# Patient Record
Sex: Male | Born: 1945 | Race: White | Hispanic: No | Marital: Married | Smoking: Former smoker
Health system: Southern US, Community
[De-identification: ages and names within clinical notes are randomized; demographics above are authoritative.]

## PROBLEM LIST (undated history)

## (undated) DIAGNOSIS — Z974 Presence of external hearing-aid: Secondary | ICD-10-CM

## (undated) DIAGNOSIS — G4733 Obstructive sleep apnea (adult) (pediatric): Secondary | ICD-10-CM

## (undated) DIAGNOSIS — Z9989 Dependence on other enabling machines and devices: Secondary | ICD-10-CM

## (undated) DIAGNOSIS — H269 Unspecified cataract: Secondary | ICD-10-CM

## (undated) DIAGNOSIS — J45909 Unspecified asthma, uncomplicated: Secondary | ICD-10-CM

## (undated) DIAGNOSIS — N182 Chronic kidney disease, stage 2 (mild): Secondary | ICD-10-CM

## (undated) DIAGNOSIS — M109 Gout, unspecified: Secondary | ICD-10-CM

## (undated) DIAGNOSIS — M4800 Spinal stenosis, site unspecified: Secondary | ICD-10-CM

## (undated) DIAGNOSIS — Z973 Presence of spectacles and contact lenses: Secondary | ICD-10-CM

## (undated) DIAGNOSIS — I10 Essential (primary) hypertension: Secondary | ICD-10-CM

## (undated) DIAGNOSIS — K4090 Unilateral inguinal hernia, without obstruction or gangrene, not specified as recurrent: Secondary | ICD-10-CM

## (undated) DIAGNOSIS — R0602 Shortness of breath: Secondary | ICD-10-CM

## (undated) DIAGNOSIS — R05 Cough: Secondary | ICD-10-CM

## (undated) DIAGNOSIS — J984 Other disorders of lung: Secondary | ICD-10-CM

## (undated) DIAGNOSIS — H409 Unspecified glaucoma: Secondary | ICD-10-CM

## (undated) DIAGNOSIS — T7840XA Allergy, unspecified, initial encounter: Secondary | ICD-10-CM

## (undated) DIAGNOSIS — M1990 Unspecified osteoarthritis, unspecified site: Secondary | ICD-10-CM

## (undated) DIAGNOSIS — K760 Fatty (change of) liver, not elsewhere classified: Secondary | ICD-10-CM

## (undated) DIAGNOSIS — G4730 Sleep apnea, unspecified: Secondary | ICD-10-CM

## (undated) DIAGNOSIS — J189 Pneumonia, unspecified organism: Secondary | ICD-10-CM

## (undated) DIAGNOSIS — E119 Type 2 diabetes mellitus without complications: Secondary | ICD-10-CM

## (undated) DIAGNOSIS — E669 Obesity, unspecified: Secondary | ICD-10-CM

## (undated) DIAGNOSIS — H9190 Unspecified hearing loss, unspecified ear: Secondary | ICD-10-CM

## (undated) DIAGNOSIS — E785 Hyperlipidemia, unspecified: Secondary | ICD-10-CM

## (undated) DIAGNOSIS — M25569 Pain in unspecified knee: Secondary | ICD-10-CM

## (undated) DIAGNOSIS — IMO0002 Reserved for concepts with insufficient information to code with codable children: Secondary | ICD-10-CM

## (undated) DIAGNOSIS — R0609 Other forms of dyspnea: Secondary | ICD-10-CM

## (undated) DIAGNOSIS — D1431 Benign neoplasm of right bronchus and lung: Secondary | ICD-10-CM

## (undated) DIAGNOSIS — R600 Localized edema: Secondary | ICD-10-CM

## (undated) DIAGNOSIS — J309 Allergic rhinitis, unspecified: Secondary | ICD-10-CM

## (undated) DIAGNOSIS — H6980 Other specified disorders of Eustachian tube, unspecified ear: Secondary | ICD-10-CM

## (undated) DIAGNOSIS — R0600 Dyspnea, unspecified: Secondary | ICD-10-CM

## (undated) DIAGNOSIS — N200 Calculus of kidney: Secondary | ICD-10-CM

## (undated) DIAGNOSIS — Z87442 Personal history of urinary calculi: Secondary | ICD-10-CM

## (undated) DIAGNOSIS — R319 Hematuria, unspecified: Secondary | ICD-10-CM

## (undated) DIAGNOSIS — R053 Chronic cough: Secondary | ICD-10-CM

## (undated) DIAGNOSIS — K219 Gastro-esophageal reflux disease without esophagitis: Secondary | ICD-10-CM

## (undated) DIAGNOSIS — H6990 Unspecified Eustachian tube disorder, unspecified ear: Secondary | ICD-10-CM

## (undated) DIAGNOSIS — M5430 Sciatica, unspecified side: Secondary | ICD-10-CM

## (undated) HISTORY — DX: Sciatica, unspecified side: M54.30

## (undated) HISTORY — DX: Unspecified osteoarthritis, unspecified site: M19.90

## (undated) HISTORY — DX: Fatty (change of) liver, not elsewhere classified: K76.0

## (undated) HISTORY — PX: LOBECTOMY: SHX5089

## (undated) HISTORY — PX: HERNIA REPAIR: SHX51

## (undated) HISTORY — DX: Unspecified hearing loss, unspecified ear: H91.90

## (undated) HISTORY — PX: OTHER SURGICAL HISTORY: SHX169

## (undated) HISTORY — DX: Localized edema: R60.0

## (undated) HISTORY — DX: Sleep apnea, unspecified: G47.30

## (undated) HISTORY — DX: Reserved for concepts with insufficient information to code with codable children: IMO0002

## (undated) HISTORY — DX: Gout, unspecified: M10.9

## (undated) HISTORY — DX: Spinal stenosis, site unspecified: M48.00

## (undated) HISTORY — DX: Hematuria, unspecified: R31.9

## (undated) HISTORY — DX: Essential (primary) hypertension: I10

## (undated) HISTORY — DX: Hyperlipidemia, unspecified: E78.5

## (undated) HISTORY — DX: Other disorders of lung: J98.4

## (undated) HISTORY — DX: Unspecified eustachian tube disorder, unspecified ear: H69.90

## (undated) HISTORY — DX: Unspecified cataract: H26.9

## (undated) HISTORY — DX: Obesity, unspecified: E66.9

## (undated) HISTORY — DX: Calculus of kidney: N20.0

## (undated) HISTORY — DX: Type 2 diabetes mellitus without complications: E11.9

## (undated) HISTORY — DX: Pain in unspecified knee: M25.569

## (undated) HISTORY — DX: Allergy, unspecified, initial encounter: T78.40XA

## (undated) HISTORY — DX: Pneumonia, unspecified organism: J18.9

## (undated) HISTORY — DX: Allergic rhinitis, unspecified: J30.9

## (undated) HISTORY — PX: BACK SURGERY: SHX140

## (undated) HISTORY — DX: Other specified disorders of Eustachian tube, unspecified ear: H69.80

## (undated) HISTORY — PX: LITHOTRIPSY: SUR834

---
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
## 1974-07-13 HISTORY — PX: APPENDECTOMY: SHX54

## 1978-07-13 HISTORY — PX: OTHER SURGICAL HISTORY: SHX169

## 1999-07-14 HISTORY — PX: HEMORRHOID SURGERY: SHX153

## 2010-08-13 HISTORY — PX: COLONOSCOPY: SHX174

## 2011-08-20 DIAGNOSIS — IMO0002 Reserved for concepts with insufficient information to code with codable children: Secondary | ICD-10-CM | POA: Diagnosis not present

## 2011-08-20 DIAGNOSIS — M999 Biomechanical lesion, unspecified: Secondary | ICD-10-CM | POA: Diagnosis not present

## 2011-08-21 DIAGNOSIS — M999 Biomechanical lesion, unspecified: Secondary | ICD-10-CM | POA: Diagnosis not present

## 2011-08-21 DIAGNOSIS — IMO0002 Reserved for concepts with insufficient information to code with codable children: Secondary | ICD-10-CM | POA: Diagnosis not present

## 2011-08-24 DIAGNOSIS — IMO0002 Reserved for concepts with insufficient information to code with codable children: Secondary | ICD-10-CM | POA: Diagnosis not present

## 2011-08-24 DIAGNOSIS — M999 Biomechanical lesion, unspecified: Secondary | ICD-10-CM | POA: Diagnosis not present

## 2011-08-26 DIAGNOSIS — M999 Biomechanical lesion, unspecified: Secondary | ICD-10-CM | POA: Diagnosis not present

## 2011-08-26 DIAGNOSIS — IMO0002 Reserved for concepts with insufficient information to code with codable children: Secondary | ICD-10-CM | POA: Diagnosis not present

## 2011-08-31 DIAGNOSIS — M999 Biomechanical lesion, unspecified: Secondary | ICD-10-CM | POA: Diagnosis not present

## 2011-08-31 DIAGNOSIS — IMO0002 Reserved for concepts with insufficient information to code with codable children: Secondary | ICD-10-CM | POA: Diagnosis not present

## 2011-08-31 DIAGNOSIS — M4800 Spinal stenosis, site unspecified: Secondary | ICD-10-CM | POA: Diagnosis not present

## 2011-08-31 DIAGNOSIS — M5137 Other intervertebral disc degeneration, lumbosacral region: Secondary | ICD-10-CM | POA: Diagnosis not present

## 2011-11-19 DIAGNOSIS — E119 Type 2 diabetes mellitus without complications: Secondary | ICD-10-CM | POA: Diagnosis not present

## 2011-11-19 DIAGNOSIS — H2510 Age-related nuclear cataract, unspecified eye: Secondary | ICD-10-CM | POA: Diagnosis not present

## 2011-11-19 DIAGNOSIS — H52229 Regular astigmatism, unspecified eye: Secondary | ICD-10-CM | POA: Diagnosis not present

## 2011-11-19 DIAGNOSIS — H5200 Hypermetropia, unspecified eye: Secondary | ICD-10-CM | POA: Diagnosis not present

## 2012-01-05 DIAGNOSIS — K7689 Other specified diseases of liver: Secondary | ICD-10-CM | POA: Diagnosis not present

## 2012-01-05 DIAGNOSIS — E785 Hyperlipidemia, unspecified: Secondary | ICD-10-CM | POA: Diagnosis not present

## 2012-01-05 DIAGNOSIS — I10 Essential (primary) hypertension: Secondary | ICD-10-CM | POA: Diagnosis not present

## 2012-01-05 DIAGNOSIS — M109 Gout, unspecified: Secondary | ICD-10-CM | POA: Diagnosis not present

## 2012-01-05 DIAGNOSIS — Z23 Encounter for immunization: Secondary | ICD-10-CM | POA: Diagnosis not present

## 2012-02-10 DIAGNOSIS — M25579 Pain in unspecified ankle and joints of unspecified foot: Secondary | ICD-10-CM | POA: Diagnosis not present

## 2012-02-25 DIAGNOSIS — J309 Allergic rhinitis, unspecified: Secondary | ICD-10-CM | POA: Diagnosis not present

## 2012-02-25 DIAGNOSIS — I10 Essential (primary) hypertension: Secondary | ICD-10-CM | POA: Diagnosis not present

## 2012-02-25 DIAGNOSIS — J019 Acute sinusitis, unspecified: Secondary | ICD-10-CM | POA: Diagnosis not present

## 2012-02-25 DIAGNOSIS — E119 Type 2 diabetes mellitus without complications: Secondary | ICD-10-CM | POA: Diagnosis not present

## 2012-09-14 DIAGNOSIS — E119 Type 2 diabetes mellitus without complications: Secondary | ICD-10-CM | POA: Diagnosis not present

## 2012-09-14 DIAGNOSIS — J069 Acute upper respiratory infection, unspecified: Secondary | ICD-10-CM | POA: Diagnosis not present

## 2012-09-14 DIAGNOSIS — I10 Essential (primary) hypertension: Secondary | ICD-10-CM | POA: Diagnosis not present

## 2012-09-14 DIAGNOSIS — H6980 Other specified disorders of Eustachian tube, unspecified ear: Secondary | ICD-10-CM | POA: Diagnosis not present

## 2012-09-30 DIAGNOSIS — I10 Essential (primary) hypertension: Secondary | ICD-10-CM | POA: Diagnosis not present

## 2012-09-30 DIAGNOSIS — E785 Hyperlipidemia, unspecified: Secondary | ICD-10-CM | POA: Diagnosis not present

## 2012-11-15 DIAGNOSIS — E669 Obesity, unspecified: Secondary | ICD-10-CM | POA: Diagnosis not present

## 2012-11-15 DIAGNOSIS — E119 Type 2 diabetes mellitus without complications: Secondary | ICD-10-CM | POA: Diagnosis not present

## 2012-11-15 DIAGNOSIS — I10 Essential (primary) hypertension: Secondary | ICD-10-CM | POA: Diagnosis not present

## 2012-11-15 DIAGNOSIS — E785 Hyperlipidemia, unspecified: Secondary | ICD-10-CM | POA: Diagnosis not present

## 2012-12-14 DIAGNOSIS — E119 Type 2 diabetes mellitus without complications: Secondary | ICD-10-CM | POA: Diagnosis not present

## 2012-12-14 DIAGNOSIS — H2510 Age-related nuclear cataract, unspecified eye: Secondary | ICD-10-CM | POA: Diagnosis not present

## 2012-12-14 DIAGNOSIS — H43399 Other vitreous opacities, unspecified eye: Secondary | ICD-10-CM | POA: Diagnosis not present

## 2013-01-05 DIAGNOSIS — M48062 Spinal stenosis, lumbar region with neurogenic claudication: Secondary | ICD-10-CM | POA: Diagnosis not present

## 2013-01-05 DIAGNOSIS — M5137 Other intervertebral disc degeneration, lumbosacral region: Secondary | ICD-10-CM | POA: Diagnosis not present

## 2013-01-10 DIAGNOSIS — M545 Low back pain: Secondary | ICD-10-CM | POA: Diagnosis not present

## 2013-01-12 DIAGNOSIS — M545 Low back pain: Secondary | ICD-10-CM | POA: Diagnosis not present

## 2013-01-16 DIAGNOSIS — R609 Edema, unspecified: Secondary | ICD-10-CM | POA: Diagnosis not present

## 2013-01-16 DIAGNOSIS — E785 Hyperlipidemia, unspecified: Secondary | ICD-10-CM | POA: Diagnosis not present

## 2013-01-16 DIAGNOSIS — I10 Essential (primary) hypertension: Secondary | ICD-10-CM | POA: Diagnosis not present

## 2013-01-16 DIAGNOSIS — E119 Type 2 diabetes mellitus without complications: Secondary | ICD-10-CM | POA: Diagnosis not present

## 2013-01-20 DIAGNOSIS — M545 Low back pain: Secondary | ICD-10-CM | POA: Diagnosis not present

## 2013-01-26 DIAGNOSIS — M545 Low back pain: Secondary | ICD-10-CM | POA: Diagnosis not present

## 2013-02-07 DIAGNOSIS — M545 Low back pain: Secondary | ICD-10-CM | POA: Diagnosis not present

## 2013-02-07 DIAGNOSIS — R609 Edema, unspecified: Secondary | ICD-10-CM | POA: Diagnosis not present

## 2013-02-07 DIAGNOSIS — I10 Essential (primary) hypertension: Secondary | ICD-10-CM | POA: Diagnosis not present

## 2013-02-07 DIAGNOSIS — R5381 Other malaise: Secondary | ICD-10-CM | POA: Diagnosis not present

## 2013-02-07 DIAGNOSIS — R0609 Other forms of dyspnea: Secondary | ICD-10-CM | POA: Diagnosis not present

## 2013-02-07 DIAGNOSIS — R5383 Other fatigue: Secondary | ICD-10-CM | POA: Diagnosis not present

## 2013-02-07 DIAGNOSIS — E119 Type 2 diabetes mellitus without complications: Secondary | ICD-10-CM | POA: Diagnosis not present

## 2013-02-08 DIAGNOSIS — R0602 Shortness of breath: Secondary | ICD-10-CM | POA: Diagnosis not present

## 2013-02-08 DIAGNOSIS — R911 Solitary pulmonary nodule: Secondary | ICD-10-CM | POA: Diagnosis not present

## 2013-02-08 DIAGNOSIS — R609 Edema, unspecified: Secondary | ICD-10-CM | POA: Diagnosis not present

## 2013-02-08 DIAGNOSIS — I10 Essential (primary) hypertension: Secondary | ICD-10-CM | POA: Diagnosis not present

## 2013-02-08 DIAGNOSIS — R0989 Other specified symptoms and signs involving the circulatory and respiratory systems: Secondary | ICD-10-CM | POA: Diagnosis not present

## 2013-02-08 DIAGNOSIS — R0609 Other forms of dyspnea: Secondary | ICD-10-CM | POA: Diagnosis not present

## 2013-02-08 DIAGNOSIS — E119 Type 2 diabetes mellitus without complications: Secondary | ICD-10-CM | POA: Diagnosis not present

## 2013-02-10 HISTORY — PX: TRANSTHORACIC ECHOCARDIOGRAM: SHX275

## 2013-02-15 DIAGNOSIS — R0989 Other specified symptoms and signs involving the circulatory and respiratory systems: Secondary | ICD-10-CM | POA: Diagnosis not present

## 2013-02-15 DIAGNOSIS — I10 Essential (primary) hypertension: Secondary | ICD-10-CM | POA: Diagnosis not present

## 2013-02-15 DIAGNOSIS — R0609 Other forms of dyspnea: Secondary | ICD-10-CM | POA: Diagnosis not present

## 2013-02-15 DIAGNOSIS — R609 Edema, unspecified: Secondary | ICD-10-CM | POA: Diagnosis not present

## 2013-02-15 DIAGNOSIS — Z6841 Body Mass Index (BMI) 40.0 and over, adult: Secondary | ICD-10-CM | POA: Diagnosis not present

## 2013-02-15 DIAGNOSIS — M109 Gout, unspecified: Secondary | ICD-10-CM | POA: Diagnosis not present

## 2013-02-15 DIAGNOSIS — E119 Type 2 diabetes mellitus without complications: Secondary | ICD-10-CM | POA: Diagnosis not present

## 2013-02-15 DIAGNOSIS — Z1331 Encounter for screening for depression: Secondary | ICD-10-CM | POA: Diagnosis not present

## 2013-02-16 ENCOUNTER — Ambulatory Visit (HOSPITAL_COMMUNITY): Payer: Medicare Other | Attending: Endocrinology

## 2013-02-16 ENCOUNTER — Other Ambulatory Visit (HOSPITAL_COMMUNITY): Payer: Self-pay | Admitting: Internal Medicine

## 2013-02-16 DIAGNOSIS — Z87891 Personal history of nicotine dependence: Secondary | ICD-10-CM | POA: Insufficient documentation

## 2013-02-16 DIAGNOSIS — R0609 Other forms of dyspnea: Secondary | ICD-10-CM | POA: Insufficient documentation

## 2013-02-16 DIAGNOSIS — R0602 Shortness of breath: Secondary | ICD-10-CM | POA: Diagnosis not present

## 2013-02-16 DIAGNOSIS — R0989 Other specified symptoms and signs involving the circulatory and respiratory systems: Secondary | ICD-10-CM | POA: Insufficient documentation

## 2013-02-16 NOTE — Progress Notes (Signed)
Echocardiogram performed.  

## 2013-02-23 DIAGNOSIS — M545 Low back pain: Secondary | ICD-10-CM | POA: Diagnosis not present

## 2013-03-02 DIAGNOSIS — K769 Liver disease, unspecified: Secondary | ICD-10-CM | POA: Diagnosis not present

## 2013-03-02 DIAGNOSIS — R918 Other nonspecific abnormal finding of lung field: Secondary | ICD-10-CM | POA: Diagnosis not present

## 2013-03-02 DIAGNOSIS — N281 Cyst of kidney, acquired: Secondary | ICD-10-CM | POA: Diagnosis not present

## 2013-03-02 DIAGNOSIS — I7090 Unspecified atherosclerosis: Secondary | ICD-10-CM | POA: Diagnosis not present

## 2013-03-10 ENCOUNTER — Encounter: Payer: Self-pay | Admitting: *Deleted

## 2013-03-10 DIAGNOSIS — R911 Solitary pulmonary nodule: Secondary | ICD-10-CM | POA: Insufficient documentation

## 2013-03-14 ENCOUNTER — Institutional Professional Consult (permissible substitution) (INDEPENDENT_AMBULATORY_CARE_PROVIDER_SITE_OTHER): Payer: Medicare Other | Admitting: Thoracic Surgery (Cardiothoracic Vascular Surgery)

## 2013-03-14 ENCOUNTER — Encounter: Payer: Self-pay | Admitting: Thoracic Surgery (Cardiothoracic Vascular Surgery)

## 2013-03-14 ENCOUNTER — Other Ambulatory Visit: Payer: Self-pay | Admitting: *Deleted

## 2013-03-14 VITALS — BP 163/85 | HR 79 | Resp 20 | Ht 68.0 in | Wt 265.0 lb

## 2013-03-14 DIAGNOSIS — R911 Solitary pulmonary nodule: Secondary | ICD-10-CM | POA: Diagnosis not present

## 2013-03-14 NOTE — Progress Notes (Signed)
PCP is Alysia Penna, MD Referring Provider is Alysia Penna, MD  Chief Complaint  Patient presents with  . Lung Lesion    Surgical eval on RUL Lung nodule Chest CT 03/02/13     HPI: 67 year old former smoker presents for evaluation of a right upper lobe lung nodule.  [name] is a 67 year old gentleman with diabetes, hypertension, remote tobacco abuse, and possibly congestive heart failure. He has been complaining of shortness of breath with exertion and fluid retention. He states that he gets short of breath with almost any exertion such as walking 100 yards or less on level ground. He would become very short of breath walking up one flight of stairs. Recently he went to a baseball game and was unable to walk up an incline to the stadium and had to ride up in a golf cart. Basically says that almost any exertion will cause him to have shortness of breath. He says he feels like he is occasionally wheezing but it does not happen every time. He's not had any chest pain, pressure, or tightness. His chronic lower extremity edema has also worsened recently.  As part of his evaluation he had a chest x-ray which showed a right upper lobe mass. Of note he had a known mass which was previously biopsied back in 2004. The biopsy showed a benign fibrous tumor. The finding on chest x-ray led to a CT of the chest which showed a 2.6 x 2.2 cm mass in the right upper lobe. This was smoothly marginated. He was admitted to the hospital with "congestive heart failure and lung cancer". He went to see Dr. Link Snuffer. He had an echocardiogram done. It showed normal left ventricular function with ejection fraction of 65-70%. There was grade 1 diastolic dysfunction. He had a CT with contrast rule out pulmonary emboli. That once again showed the right upper lobe mass and 2 small (5 mm) nodules in the lower lobes. There is no evidence of PE.   Past Medical History  Diagnosis Date  . Hypertension   . Gout   . Arthritis    . Sciatica   . Pneumonitis     right lung  . Leg edema   . Diabetes mellitus   . Allergic rhinitis   . Blood in urine   . Calculus of kidney   . DDD (degenerative disc disease)   . Eustachian tube dysfunction   . Hyperlipidemia   . Obesity   . Spinal stenosis   . Joint pain, knee   . Fatty liver     Past Surgical History  Procedure Laterality Date  . Lumbar disectomy l4-5  1980  . Hemorrhoid surgery  2001  . Appendectomy  1976  . Lithotripsy      Family History  Problem Relation Age of Onset  . Leukemia Father   . Breast cancer Maternal Grandmother   . Hypertension Maternal Grandfather     Social History History  Substance Use Topics  . Smoking status: Former Smoker -- 1.50 packs/day for 42 years    Types: Cigarettes    Quit date: 07/13/2002  . Smokeless tobacco: Never Used  . Alcohol Use: Yes     Comment: beer    Current Outpatient Prescriptions  Medication Sig Dispense Refill  . allopurinol (ZYLOPRIM) 100 MG tablet Take 100 mg by mouth daily.       [name] atorvastatin (LIPITOR) 10 MG tablet Take 10 mg by mouth daily.      . cetirizine (ZYRTEC) 10 MG tablet  Take 10 mg by mouth daily.      . colchicine 0.6 MG tablet Take 0.6 mg by mouth 2 (two) times daily. PRN for Gout      . diclofenac (VOLTAREN) 75 MG EC tablet Take 75 mg by mouth 2 (two) times daily.       . furosemide (LASIX) 40 MG tablet Take 40 mg by mouth daily.       [name] gabapentin (NEURONTIN) 300 MG capsule Take 300 mg by mouth 3 (three) times daily.       [name] glipiZIDE (GLUCOTROL) 10 MG tablet Take 10 mg by mouth daily.      [name] JANUMET 50-1000 MG per tablet Take 1 tablet by mouth 2 (two) times daily with a meal.       . lisinopril (PRINIVIL,ZESTRIL) 20 MG tablet Take 20 mg by mouth daily.       . mometasone (NASONEX) 50 MCG/ACT nasal spray Place 2 sprays into the nose daily.       No current facility-administered medications for this visit.    No Known Allergies  Review of Systems  Constitutional:  Positive for fatigue.  HENT: Positive for hearing loss.   Respiratory: Positive for cough (productive in the mornings), shortness of breath and wheezing (occasional). Negative for chest tightness.   Cardiovascular: Positive for leg swelling ("poor circulation"). Negative for chest pain.  Genitourinary: Positive for frequency.  Musculoskeletal: Positive for joint swelling.  Neurological: Negative.   Hematological: Bruises/bleeds easily.  All other systems reviewed and are negative.    BP 163/85  Pulse 79  Resp 20  Ht 5\' 8"  (1.727 m)  Wt 265 lb (120.203 kg)  BMI 40.3 kg/m2  SpO2 96% Physical Exam  Vitals reviewed. Constitutional: He is oriented to person, place, and time.  obese  HENT:  Head: Normocephalic and atraumatic.  Eyes: EOM are normal. Pupils are equal, round, and reactive to light.  Neck: Neck supple. No thyromegaly present.  Cardiovascular: Normal rate, regular rhythm and normal heart sounds.  Exam reveals no gallop and no friction rub.   No murmur heard. Pulmonary/Chest: He has no wheezes. He has no rales.  Diminished BS bilaterally  Abdominal: Soft. There is no tenderness.  Musculoskeletal: He exhibits edema (2+).  Venous stasis changes both LE  Lymphadenopathy:    He has no cervical adenopathy.  Neurological: He is alert and oriented to person, place, and time. No cranial nerve deficit.  Skin: Skin is warm and dry.     Diagnostic Tests: CT of chest 02/08/2013 Findings: There is a 2.7 by a 2.2 x 2.1 cm smooth nodule in the posterior aspect of the right upper lobe. There is a 5 mm smooth nodule in the left lower lobe on image number 37 of series 3.  The lungs are otherwise clear. There is no hilar or mediastinal adenopathy. Heart size is normal. No effusions. No significant osseous abnormality. The visualized portion of the upper abdomen is normal. IMPRESSION: Smoothly marginated soft tissue nodule in the right upper lobe. 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 I recommend a PET scan for further evaluation of this nodule. I recommend follow-up of the 5 mm nodule at the left lung base in 6 months with an unenhanced CT scan of the PET scan does not reveal malignancy.  Original Report Authenticated By: Francene Boyers, M.D.  2-D echocardiogram 02/16/13 Study Conclusions  - Left ventricle: The  cavity size was normal. Wall thickness was increased in a pattern of moderate LVH. There was mild focal basal hypertrophy of the septum. Systolic function was vigorous. The estimated ejection fraction was in the range of 65% to 70%. Wall motion was normal; there were no regional wall motion abnormalities. Doppler parameters are consistent with abnormal left ventricular relaxation (grade 1 diastolic dysfunction). - Aortic valve: Trivial regurgitation. - Left atrium: The atrium was mildly dilated. - Pulmonary arteries: Systolic pressure was mildly to moderately increased. PA peak pressure: 47mm Hg (S). - Pericardium, extracardiac: A trivial pericardial effusion was identified.   Impression: 67 year old gentleman with a right upper lobe mass. This lesion has been present since at least 2004. It has grown in size and that time from 1.6 cm to 2.6 cm in maximal diameter. It has been biopsied in the past, and the biopsy reportedly showed a benign fibrous tumor. 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  Given that the lesion has increased in size I do think it would be reasonable to do a wedge resection. However given the extremely slow growth of the lesion there is no urgency to that procedure.  Of more immediate concern is his severe shortness of breath which is occurring with almost any activity. He has normal left ventricular function, but does have some pretty significant coronary artery calcification on his CT scans and may have underlying coronary artery disease. I  think a pharmacologic stress test would be useful. He's not had any anginal symptoms but does have diabetes and therefore the shortness of breath could be his anginal equivalent. I will defer to Dr. Link Snuffer on that issue  He does have a pretty extensive smoking history. He really doesn't have much in the way of emphysematous changes on his CT. I will go ahead and check some pulmonary function tests on him. I think given the nature of his symptoms a cardiac etiology is more likely.  Plan:  Pulmonary function testing.  He has an appointment with Dr. Link Snuffer on 03/28/2013  I will plan to see him back in about 4 weeks to review the results and potentially further discuss wedge resection of his right upper lobe mass.

## 2013-03-16 ENCOUNTER — Ambulatory Visit (HOSPITAL_COMMUNITY)
Admission: RE | Admit: 2013-03-16 | Discharge: 2013-03-16 | Disposition: A | Discharge status: Home / Self Care | Payer: Medicare Other | Source: Ambulatory Visit | Attending: Thoracic Surgery (Cardiothoracic Vascular Surgery) | Admitting: Thoracic Surgery (Cardiothoracic Vascular Surgery)

## 2013-03-16 DIAGNOSIS — R911 Solitary pulmonary nodule: Secondary | ICD-10-CM | POA: Diagnosis not present

## 2013-03-16 LAB — PULMONARY FUNCTION TEST

## 2013-03-16 MED ORDER — ALBUTEROL SULFATE (5 MG/ML) 0.5% IN NEBU
2.5000 mg | INHALATION_SOLUTION | Freq: Once | RESPIRATORY_TRACT | Status: AC
  Administered 2013-03-16: 2.5 mg via RESPIRATORY_TRACT

## 2013-03-20 DIAGNOSIS — I10 Essential (primary) hypertension: Secondary | ICD-10-CM | POA: Diagnosis not present

## 2013-03-20 DIAGNOSIS — M109 Gout, unspecified: Secondary | ICD-10-CM | POA: Diagnosis not present

## 2013-03-20 DIAGNOSIS — E119 Type 2 diabetes mellitus without complications: Secondary | ICD-10-CM | POA: Diagnosis not present

## 2013-03-20 DIAGNOSIS — Z125 Encounter for screening for malignant neoplasm of prostate: Secondary | ICD-10-CM | POA: Diagnosis not present

## 2013-03-28 DIAGNOSIS — Z125 Encounter for screening for malignant neoplasm of prostate: Secondary | ICD-10-CM | POA: Diagnosis not present

## 2013-03-28 DIAGNOSIS — Z23 Encounter for immunization: Secondary | ICD-10-CM | POA: Diagnosis not present

## 2013-03-28 DIAGNOSIS — R0609 Other forms of dyspnea: Secondary | ICD-10-CM | POA: Diagnosis not present

## 2013-03-28 DIAGNOSIS — J984 Other disorders of lung: Secondary | ICD-10-CM | POA: Diagnosis not present

## 2013-03-28 DIAGNOSIS — Z0000 Encounter for general adult medical examination without abnormal findings: Secondary | ICD-10-CM | POA: Diagnosis not present

## 2013-03-28 DIAGNOSIS — R609 Edema, unspecified: Secondary | ICD-10-CM | POA: Diagnosis not present

## 2013-03-28 DIAGNOSIS — E119 Type 2 diabetes mellitus without complications: Secondary | ICD-10-CM | POA: Diagnosis not present

## 2013-03-28 DIAGNOSIS — M109 Gout, unspecified: Secondary | ICD-10-CM | POA: Diagnosis not present

## 2013-03-28 DIAGNOSIS — I10 Essential (primary) hypertension: Secondary | ICD-10-CM | POA: Diagnosis not present

## 2013-04-04 DIAGNOSIS — Z1212 Encounter for screening for malignant neoplasm of rectum: Secondary | ICD-10-CM | POA: Diagnosis not present

## 2013-04-11 ENCOUNTER — Ambulatory Visit: Payer: Medicare Other | Admitting: Thoracic Surgery (Cardiothoracic Vascular Surgery)

## 2013-04-11 NOTE — Progress Notes (Signed)
This encounter was created in error - please disregard.

## 2013-04-13 ENCOUNTER — Other Ambulatory Visit: Payer: Self-pay

## 2013-04-13 ENCOUNTER — Encounter: Payer: Self-pay | Admitting: Thoracic Surgery (Cardiothoracic Vascular Surgery)

## 2013-04-13 ENCOUNTER — Ambulatory Visit (INDEPENDENT_AMBULATORY_CARE_PROVIDER_SITE_OTHER): Payer: Medicare Other | Admitting: Thoracic Surgery (Cardiothoracic Vascular Surgery)

## 2013-04-13 VITALS — BP 152/84 | HR 67 | Resp 16 | Ht 68.0 in | Wt 265.0 lb

## 2013-04-13 DIAGNOSIS — D381 Neoplasm of uncertain behavior of trachea, bronchus and lung: Secondary | ICD-10-CM

## 2013-04-13 DIAGNOSIS — R911 Solitary pulmonary nodule: Secondary | ICD-10-CM | POA: Diagnosis not present

## 2013-04-13 NOTE — Progress Notes (Signed)
HPI:  HPI: 67 year old former smoker presents for evaluation of a right upper lobe lung nodule and to discuss results of his pulmonary function testing .  Mr. Bogan is a 67 year old gentleman with diabetes, hypertension, remote tobacco abuse, and possibly congestive heart failure. He has been complaining of shortness of breath with exertion and fluid retention. He states that he gets short of breath with almost any exertion such as walking 100 yards or less on level ground. He would become very short of breath walking up one flight of stairs. Recently he went to a baseball game and was unable to walk up an incline to the stadium and had to ride up in a golf cart. Basically says that almost any exertion will cause him to have shortness of breath. He says he feels like he is occasionally wheezing but it does not happen every time. He's not had any chest pain, pressure, or tightness. His chronic lower extremity edema has also worsened recently.   As part of his evaluation he had a chest x-ray which showed a right upper lobe mass. Of note he had a known mass which was previously biopsied back in 2004. The biopsy showed a benign fibrous tumor. The finding on chest x-ray led to a CT of the chest which showed a 2.6 x 2.2 cm mass in the right upper lobe. This was smoothly marginated. He was admitted to the hospital with "congestive heart failure and lung cancer". He went to see Dr. Link Snuffer. He had an echocardiogram done. It showed normal left ventricular function with ejection fraction of 65-70%. There was grade 1 diastolic dysfunction. He had a CT with contrast rule out pulmonary emboli. That once again showed the right upper lobe mass and 2 small (5 mm) nodules in the lower lobes. There is no evidence of PE.  Since his last visit his symptoms have not changed. He has an appointment to see Dr. Herbie Baltimore for cardiology consultation on October 7.   Past Medical History  Diagnosis Date  . Hypertension   . Gout    . Arthritis   . Sciatica   . Pneumonitis     right lung  . Leg edema   . Diabetes mellitus   . Allergic rhinitis   . Blood in urine   . Calculus of kidney   . DDD (degenerative disc disease)   . Eustachian tube dysfunction   . Hyperlipidemia   . Obesity   . Spinal stenosis   . Joint pain, knee   . Fatty liver       Current Outpatient Prescriptions  Medication Sig Dispense Refill  . cetirizine (ZYRTEC) 10 MG tablet Take 10 mg by mouth daily.      . colchicine 0.6 MG tablet Take 0.6 mg by mouth 2 (two) times daily. PRN for Gout      . diclofenac (VOLTAREN) 75 MG EC tablet Take 75 mg by mouth 2 (two) times daily.       . furosemide (LASIX) 40 MG tablet Take 40 mg by mouth daily.       [name] gabapentin (NEURONTIN) 300 MG capsule Take 300 mg by mouth 3 (three) times daily.       [name] JANUMET 50-1000 MG per tablet Take 1 tablet by mouth 2 (two) times daily with a meal.       . lisinopril (PRINIVIL,ZESTRIL) 20 MG tablet Take 20 mg by mouth daily.       . mometasone (NASONEX) 50 MCG/ACT nasal spray Place 2 sprays  into the nose daily.       No current facility-administered medications for this visit.    Physical Exam BP 152/84  Pulse 67  Resp 16  Ht 5\' 8"  (1.727 m)  Wt 265 lb (120.203 kg)  BMI 40.3 kg/m2  SpO21 59%\ 67 year old male in no acute distress Neurologic alert and oriented x3 with no focal deficits No cervical or subclavicular adenopathy Lungs diminished breath sounds bilaterally, no wheezing Cardiac regular rate and rhythm normal S1 and S2 No clubbing cyanosis or edema  Diagnostic Tests:  Pulmonary function testing 03/16/2013  FVC 3.13 (75%) FEV1 2.43 (79%) FEV1/FVC 0.78 (104%)  SVC 3.12 (75%)  DLCO 69% No significant change with bronchodilators  Impression: 67 year old male with a 2.2 x 2.6 cm right upper lobe mass. This more than likely is benign, but given its size I do think it should be resected. We should be able to do this with a VATS/wedge resection.  If the frozen section was positive his PFTs indicate he could tolerate a lobectomy. I think that is extremely unlikely to be necessary. There is an outside chance that this is a pleural-based mass such as a benign pleural fibrous tumor in which case he could be resected directly and not require any lung resection.  He still short of breath with exertion. His pulmonary function testing showed a restrictive component but no real obstructive component. His pulmonary function is about 75%of normal so I don't think that is the primary reason for his exertional dyspnea, although it may be a contributor. He is going to see Dr. Herbie Baltimore next week and will await the results of that evaluation before proceeding with surgical resection.  I described the proposed operation to Mr. Jernigan and his wife. I recommended a right VATS, wedge resection, possible lobectomy (very unlikely). They understand the indications, risks, benefits, and alternatives. I discussed the nature of the operation, the need for general anesthesia, the incisions to be used, and the expected hospital stay. We also discussed the expected overall recovery and return to work. They understand the risk include, but are not limited, death, MI, DVT, PE, bleeding, possible need for transfusion, infection, prolonged air leak, cardiac arrhythmias, as well as the possibility of unforeseeable complications.  He understands and accepts these risks.  We have tentatively set a date for Monday, October 20. He would be admitted on the day of surgery.  Plan:

## 2013-04-17 ENCOUNTER — Encounter: Payer: Self-pay | Admitting: Cardiology

## 2013-04-17 DIAGNOSIS — I10 Essential (primary) hypertension: Secondary | ICD-10-CM | POA: Insufficient documentation

## 2013-04-17 DIAGNOSIS — R0609 Other forms of dyspnea: Secondary | ICD-10-CM | POA: Insufficient documentation

## 2013-04-17 DIAGNOSIS — E785 Hyperlipidemia, unspecified: Secondary | ICD-10-CM | POA: Insufficient documentation

## 2013-04-17 DIAGNOSIS — E782 Mixed hyperlipidemia: Secondary | ICD-10-CM | POA: Insufficient documentation

## 2013-04-17 DIAGNOSIS — R600 Localized edema: Secondary | ICD-10-CM | POA: Insufficient documentation

## 2013-04-17 DIAGNOSIS — E1169 Type 2 diabetes mellitus with other specified complication: Secondary | ICD-10-CM | POA: Insufficient documentation

## 2013-04-18 ENCOUNTER — Encounter: Payer: Self-pay | Admitting: Cardiology

## 2013-04-18 ENCOUNTER — Ambulatory Visit (INDEPENDENT_AMBULATORY_CARE_PROVIDER_SITE_OTHER): Payer: Medicare Other | Admitting: Cardiology

## 2013-04-18 VITALS — BP 128/70 | Ht 68.0 in | Wt 266.8 lb

## 2013-04-18 DIAGNOSIS — Z01810 Encounter for preprocedural cardiovascular examination: Secondary | ICD-10-CM | POA: Insufficient documentation

## 2013-04-18 DIAGNOSIS — I10 Essential (primary) hypertension: Secondary | ICD-10-CM | POA: Diagnosis not present

## 2013-04-18 DIAGNOSIS — R0609 Other forms of dyspnea: Secondary | ICD-10-CM

## 2013-04-18 DIAGNOSIS — I2089 Other forms of angina pectoris: Secondary | ICD-10-CM | POA: Insufficient documentation

## 2013-04-18 DIAGNOSIS — D689 Coagulation defect, unspecified: Secondary | ICD-10-CM | POA: Insufficient documentation

## 2013-04-18 DIAGNOSIS — R600 Localized edema: Secondary | ICD-10-CM

## 2013-04-18 DIAGNOSIS — R609 Edema, unspecified: Secondary | ICD-10-CM

## 2013-04-18 DIAGNOSIS — I272 Pulmonary hypertension, unspecified: Secondary | ICD-10-CM | POA: Insufficient documentation

## 2013-04-18 DIAGNOSIS — I2789 Other specified pulmonary heart diseases: Secondary | ICD-10-CM

## 2013-04-18 DIAGNOSIS — E785 Hyperlipidemia, unspecified: Secondary | ICD-10-CM

## 2013-04-18 DIAGNOSIS — R0989 Other specified symptoms and signs involving the circulatory and respiratory systems: Secondary | ICD-10-CM | POA: Diagnosis not present

## 2013-04-18 DIAGNOSIS — I209 Angina pectoris, unspecified: Secondary | ICD-10-CM

## 2013-04-18 HISTORY — DX: Pulmonary hypertension, unspecified: I27.20

## 2013-04-18 LAB — CBC
HCT: 43.6 % (ref 39.0–52.0)
MCH: 30 pg (ref 26.0–34.0)
MCV: 86.7 fL (ref 78.0–100.0)
Platelets: 216 10*3/uL (ref 150–400)
RDW: 13.8 % (ref 11.5–15.5)
WBC: 6 10*3/uL (ref 4.0–10.5)

## 2013-04-18 LAB — PROTIME-INR: INR: 1.04 (ref <1.50)

## 2013-04-18 MED ORDER — METOPROLOL TARTRATE 25 MG PO TABS: 12.5000 mg | ORAL_TABLET | Freq: Two times a day (BID) | ORAL | Status: DC

## 2013-04-18 NOTE — Assessment & Plan Note (Signed)
In upcoming visits, we'll definitely do spend more time counseling him on his weight and need for weight loss.  I did briefly talk about adjusting his dietary intake and try to get exercise.  However until we get the dyspnea on exertion issue resolved, exercise will not be occurring.

## 2013-04-18 NOTE — Assessment & Plan Note (Signed)
Precatheterization coagulation panel

## 2013-04-18 NOTE — Assessment & Plan Note (Signed)
He has profound dyspnea on exertion as noted above.  He does not however have any anginal chest pain type symptoms.  I am however concerned that the dyspnea on exertion may very well be his anginal.  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  Plan: Right and Left Heart Catheterization

## 2013-04-18 NOTE — Assessment & Plan Note (Addendum)
I am quite concerned in this morbidly obese gentleman with diabetes, hypertension and dyslipidemia (albeit relatively well controlled) is now having profound exertional dyspnea.  He is very limited, and his symptoms are relatively consistent with class III dyspnea on exertion.  In a diabetic patient I am concerned this could potentially be his anginal equivalent.  He has had an echocardiogram that showed some diastolic dysfunction, but not enough to suggest/explain his current symptoms.  He also had some moderate pulmonary hypertension, which also would not be consistent with his symptoms.  Certainly there is a compound of possible obesity ventilation syndrome, but this was not suggested by PFTs.  At this point I feel that we cannot exclude this level of exertional dyspnea being class III anginal equivalent.  Plan: Right and Left Heart Catheterization right heart catheterization will allow him adequate assessment of his pulmonary pressures to determine the severity of his moment hypertension.  This will also determine if there is any left-sided component involved with it..  coronary angiography will be to assess potential obstructive CAD especially in light of his coronary calcification noted on CT scan.

## 2013-04-18 NOTE — Assessment & Plan Note (Addendum)
He does have relative the urgent need for him undergoing lung nodule resection.  However, his major limiting issue analysis exertional dyspnea.  Dr. Dareen Piano and Dr. Link Snuffer have evaluated this with an echocardiogram and PFTs, and is not sufficient data to suggest an obvious culprit.  Prior to proceeding with a at least moderate if not high risk surgery, I think is prudent that we delineate his coronary anatomy given the calcification noted on chest CT in light of his significant exertional dyspnea.  Plan: Right and Left Heart Catheterization via the Right Radial and Right IJ Approach- scheduled for October 9.

## 2013-04-18 NOTE — Progress Notes (Signed)
PATIENT: Andres Taylor MRN: [id number]  DOB: Jun 25, 1946   DOV:04/18/2013 PCP: Alysia Penna, MD  Clinic Note: Chief Complaint  Patient presents with  . New Evaluation    DOE, no chest pain, edema, dx rgt upper lobe bengnin nodule  grown to 1/2 size previouslywhich surgery schedule for 05/01/2013, Dr henderickson to do, PCP referred for doe- concernred to do surgery until he find out about DOE ;;had ECHO 02/2013,pft   HPI: Andres Taylor is a 67 y.o. male with a PMH below who presents today for preoperative cardiac evaluation for lung resection surgery.  Interval History: He is a morbidly obese gentleman with hypertension, and hyperlipidemia and type 2 diabetes with COPD from long sitting standing history of smoking.  He quit in 2004.  He is currently undergoing evaluation for a right upper lobe lung nodule that is concerning for possible lung cancer.  He's been seen by Dr. Dorris Fetch from CT surgery.  He was referred for cardiac evaluation and D2 significantly worsening dyspnea on exertion as well as lower extremity edema.  As part of his evaluation he has had PFTs and an echocardiogram performed both of which do not provide adequate data to suggest a clear etiology for his exertional dyspnea.  Concerning is that his shortness of breath with exertion is gotten to the point where he is barely able to walk around the room in his house without having significant dyspnea.  He denies any PND, orthopnea.  He also denies any chest pressure or discomfort with rest or exertion, but really hasn't pushed himself beyond the initial dyspnea before stop and catch his breath.  He recounts that time basically walking up 100 yards or less on level ground a short of breath.  If he tries to do any steps or slope, he is not able.  He went a baseball game recently and had to use a golf cart to get to where he needed to sit.  He does have 1-2+ edema bilaterally that is relatively chronic. It has gotten somewhat better with  the addition of furosemide.  However this is not necessarily help his dyspnea.  The remainder of Cardiovascular ROS: positive for - dyspnea on exertion, edema and shortness of breath negative for - chest pain, irregular heartbeat, loss of consciousness, murmur, orthopnea, palpitations, paroxysmal nocturnal dyspnea or rapid heart rate: Additional cardiac review of systems: Lightheadedness - no, dizziness - no, syncope/near-syncope - no; TIA/amaurosis fugax - no Melena - no, hematochezia no; hematuria - no; nosebleeds - no; claudication - no   Past Medical History  Diagnosis Date  . Hypertension   . Gout   . Arthritis   . Sciatica   . Pneumonitis     right lung  . Leg edema   . Diabetes mellitus   . Allergic rhinitis   . Blood in urine   . Calculus of kidney   . DDD (degenerative disc disease)   . Eustachian tube dysfunction   . Hyperlipidemia   . Obesity   . Spinal stenosis   . Joint pain, knee   . Fatty liver    Prior Cardiac Evaluation and Past Surgical History: Past Surgical History  Procedure Laterality Date  . Lumbar disectomy l4-5  1980  . Hemorrhoid surgery  2001  . Appendectomy  1976  . Lithotripsy    . Transthoracic echocardiogram  02/2013    EF 65-70%, moderate LVH (basal septal hypertrophy); normal wall motion; mild LA Dilation; PA pressures 47 mmHg   No Known Allergies  Current Outpatient Prescriptions  Medication Sig Dispense Refill  . aspirin EC 81 MG tablet Take 81 mg by mouth daily.      . cetirizine (ZYRTEC) 10 MG tablet Take 10 mg by mouth daily.      . colchicine 0.6 MG tablet Take 0.6 mg by mouth 2 (two) times daily. PRN for Gout      . diclofenac (VOLTAREN) 75 MG EC tablet Take 75 mg by mouth 2 (two) times daily.       . furosemide (LASIX) 40 MG tablet Take 40 mg by mouth daily.       [name] gabapentin (NEURONTIN) 300 MG capsule Take 300 mg by mouth 3 (three) times daily.       [name] JANUMET 50-1000 MG per tablet Take 1 tablet by mouth 2 (two) times daily  with a meal.       . lisinopril (PRINIVIL,ZESTRIL) 20 MG tablet Take 20 mg by mouth daily.       . metoprolol tartrate (LOPRESSOR) 25 MG tablet Take 0.5 tablets (12.5 mg total) by mouth 2 (two) times daily.  30 tablet  6  . mometasone (NASONEX) 50 MCG/ACT nasal spray Place 2 sprays into the nose as needed.        No current facility-administered medications for this visit.    History   Social History Narrative   He is process separation process/divorce.  He has 2 adult children   He lives alone in [place].  He works in Pharmacist, hospital.   He quit smoking in 2004 after 50+ pack year smoking history.  He occasionally drinks alcohol.   Family History: family history includes Breast cancer in his maternal grandmother; Hypertension in his maternal grandfather; Leukemia in his father.  ROS: A comprehensive Review of Systems - Negative except The dyspnea and edema noted above.  All other systems are negative.  Notably he denies any coughing or wheezing or hemoptysis.  He denies any claudication symptoms or TIA amaurosis fugax symptoms.  No headaches or blurred vision.    PHYSICAL EXAM BP 128/70  Ht 5\' 8"  (1.727 m)  Wt 266 lb 12.8 oz (121.02 kg)  BMI 40.58 kg/m2 General appearance: alert & oriented x 3, cooperative, appears stated age, no distress and morbidly obese; well groomed Neck: no adenopathy, no carotid bruit and no JVD --but very difficult to determine due to body habitus. Lungs: clear to auscultation bilaterally, normal percussion bilaterally and non-labored Heart: regular rate and rhythm, S1, S2 normal, no murmur, click, rub or gallop ; unable to palpate PMI Abdomen: soft, non-tender; bowel sounds normal; no masses,  no organomegaly; morbidly obese, no ascites Extremities: extremities normal, atraumatic, no cyanosis, and edema 2+ right lower leg, 1+ left. Pulses: 2+ and symmetric; Skin: normal or No significant rashes or lesions.  Neurologic: Mental status: Alert,  oriented, thought content appropriate Cranial nerves: normal (II-XII grossly intact)  ZOX:[id number] today: Yes Rate: of 75 , Rhythm:  NSR, normal ECG;   Recent Labs:  04/18/2013  CMP: potassium 4.9 chloride 110 bicarbonate 26.  BUN/creatinine 18/0.9.  Glucose 138 (high); LFTs normal.  CBC notable for H&H of 14.0/45, platelets 190.    Cholesterol Panel: Total cholesterol 1:15, cholesterol 61, HDL 37, LDL 66. --Excellent control.  A1c 5.4  Recent Studies:  CTA -PE : Negative for PE, significant coronary calcifications.  Mild aortic dilatation and roughly 4 cm. hazy bilateral airspace opacities suggestive of bronchopneumonia.  This could also be interstitial lung disease.  Bilateral lung nodules  noted the largest in the right upper lobe of 2.4 cm. -- Biopsy pending; subcentimeter left lung basal nodule.  2-D Echocardiogram: EF 65-70%, moderate LVH with grade 1 diastolic dysfunction.  PA pressure is estimated 45-50 mmHg (47 mmHg); and no wall motion abnormalities.  ASSESSMENT / PLAN: Exertional dyspnea I am quite concerned in this morbidly obese gentleman with diabetes, hypertension and dyslipidemia (albeit relatively well controlled) is now having profound exertional dyspnea.  He is very limited, and his symptoms are relatively consistent with class III dyspnea on exertion.  In a diabetic patient I am concerned this could potentially be his anginal equivalent.  He has had an echocardiogram that showed some diastolic dysfunction, but not enough to suggest/explain his current symptoms.  He also had some moderate pulmonary hypertension, which also would not be consistent with his symptoms.  Certainly there is a compound of possible obesity ventilation syndrome, but this was not suggested by PFTs.  At this point I feel that we cannot exclude this level of exertional dyspnea being class III anginal equivalent.  Plan: Right and Left Heart Catheterization right heart catheterization will allow him  adequate assessment of his pulmonary pressures to determine the severity of his moment hypertension.  This will also determine if there is any left-sided component involved with it..  coronary angiography will be to assess potential obstructive CAD especially in light of his coronary calcification noted on CT scan.    Angina, class III He has profound dyspnea on exertion as noted above.  He does not however have any anginal chest pain type symptoms.  I am however concerned that the dyspnea on exertion may very well be his anginal.  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  Plan: Right and Left Heart Catheterization  Pulmonary HTN With PA pressures estimated in the 4050 mm mercury range, this could potentially be an underestimation.  This is likely related to obesity LSA and even potentially left-sided diastolic dysfunction.  With his upcoming operation being planned, I think accurate assessment of his PA pressures is warranted as part of his preop evaluation.  Plan: Right Heart Catheterization in Conjunction with Left Heart Catheterization.   Pre-operative cardiovascular examination He does have relative the urgent need for him undergoing lung nodule resection.  However, his major limiting issue analysis exertional dyspnea.  Dr. Dareen Piano and Dr. Link Snuffer have evaluated this with an echocardiogram and PFTs, and is not sufficient data to suggest an obvious culprit.  Prior to proceeding with a at least moderate if not high risk surgery, I think is prudent that we delineate his coronary anatomy given the calcification noted on chest CT in light of his significant exertional dyspnea.  Plan: Right and Left Heart Catheterization via the Right Radial and Right IJ Approach- scheduled for October 9.  Hypertension Well-controlled on current medications including ACE inhibitor which is appropriate for his diabetes.  Well as low-dose beta blocker for clear  evidence of CAD.  He'll use a beta 1 selective agent given his lung disease.  Metoprolol tartrate 12.5 mg twice a day  Leg edema Currently this has improved with the addition of furosemide.  We'll get a good assessment of his pulmonary pressures and volume status during the right heart catheterization.  We'll also get a better sense as to what the true etiology of his edema is.  Hyperlipidemia - well-controlled Very excellent control by last laboratory check.  He is actually, not on staff and  with such good control.  Depending or wheezing cardiac catheterization I may still want to have him on low-dose statin.  Severe obesity (BMI >= 40) In upcoming visits, we'll definitely do spend more time counseling him on his weight and need for weight loss.  I did briefly talk about adjusting his dietary intake and try to get exercise.  However until we get the dyspnea on exertion issue resolved, exercise will not be occurring.  Other and unspecified coagulation defects Precatheterization coagulation panel   Orders Placed This Encounter  Procedures  . Basic metabolic panel  . CBC  . PTT  . Protime-INR  . EKG 12-Lead  . LEFT AND RIGHT HEART CATHETERIZATION WITH CORONARY ANGIOGRAM   Meds ordered this encounter  Medications  . aspirin EC 81 MG tablet    Sig: Take 81 mg by mouth daily.  . metoprolol tartrate (LOPRESSOR) 25 MG tablet    Sig: Take 0.5 tablets (12.5 mg total) by mouth 2 (two) times daily.    Dispense:  30 tablet    Refill:  6    Followup:  post cath 1-2 weeks  Jossalin Chervenak W. Herbie Baltimore, M.D., M.S. [facility] [address] [place], [place]  [address]  [phone] Pager # [phone]

## 2013-04-18 NOTE — Assessment & Plan Note (Signed)
With PA pressures estimated in the 4050 mm mercury range, this could potentially be an underestimation.  This is likely related to obesity LSA and even potentially left-sided diastolic dysfunction.  With his upcoming operation being planned, I think accurate assessment of his PA pressures is warranted as part of his preop evaluation.  Plan: Right Heart Catheterization in Conjunction with Left Heart Catheterization.

## 2013-04-18 NOTE — Assessment & Plan Note (Signed)
Well-controlled on current medications including ACE inhibitor which is appropriate for his diabetes.  Well as low-dose beta blocker for clear evidence of CAD.  He'll use a beta 1 selective agent given his lung disease.  Metoprolol tartrate 12.5 mg twice a day

## 2013-04-18 NOTE — Patient Instructions (Signed)
Start Metoprolol Tart 25 mg ,take 1/2 tablet  by mouth twice a day.  PLEASE DO LABS TODAY- CBC,BMP, PT, PTT  Your physician has requested that you have a cardiac catheterization. Cardiac catheterization is used to diagnose and/or treat various heart conditions. Doctors may recommend this procedure for a number of different reasons. The most common reason is to evaluate chest pain. Chest pain can be a symptom of coronary artery disease (CAD), and cardiac catheterization can show whether plaque is narrowing or blocking your heart's arteries. This procedure is also used to evaluate the valves, as well as measure the blood flow and oxygen levels in different parts of your heart. For further information please visit [email or web address] Please follow instruction sheet, as given.

## 2013-04-18 NOTE — Assessment & Plan Note (Signed)
Currently this has improved with the addition of furosemide.  We'll get a good assessment of his pulmonary pressures and volume status during the right heart catheterization.  We'll also get a better sense as to what the true etiology of his edema is.

## 2013-04-18 NOTE — Assessment & Plan Note (Signed)
Very excellent control by last laboratory check.  He is actually, not on staff and with such good control.  Depending or wheezing cardiac catheterization I may still want to have him on low-dose statin.

## 2013-04-19 ENCOUNTER — Encounter (HOSPITAL_COMMUNITY): Payer: Self-pay | Admitting: Pharmacy Technician

## 2013-04-19 ENCOUNTER — Other Ambulatory Visit: Payer: Self-pay | Admitting: *Deleted

## 2013-04-19 DIAGNOSIS — Z01810 Encounter for preprocedural cardiovascular examination: Secondary | ICD-10-CM

## 2013-04-19 LAB — BASIC METABOLIC PANEL
BUN: 17 mg/dL (ref 6–23)
CO2: 29 mEq/L (ref 19–32)
Chloride: 99 mEq/L (ref 96–112)
Glucose, Bld: 95 mg/dL (ref 70–99)
Potassium: 4.5 mEq/L (ref 3.5–5.3)
Sodium: 137 mEq/L (ref 135–145)

## 2013-04-19 MED ORDER — CEFUROXIME SODIUM 1.5 G IJ SOLR
1.5000 g | INTRAMUSCULAR | Status: DC
  Filled 2013-04-19: qty 1.5

## 2013-04-20 ENCOUNTER — Ambulatory Visit (HOSPITAL_COMMUNITY)
Admission: RE | Admit: 2013-04-20 | Discharge: 2013-04-20 | Disposition: A | Discharge status: Home / Self Care | Payer: Medicare Other | Source: Ambulatory Visit | Attending: Cardiology | Admitting: Cardiology

## 2013-04-20 ENCOUNTER — Encounter (HOSPITAL_COMMUNITY)
Admission: RE | Disposition: A | Discharge status: Home / Self Care | Payer: Self-pay | Source: Ambulatory Visit | Attending: Cardiology

## 2013-04-20 DIAGNOSIS — R609 Edema, unspecified: Secondary | ICD-10-CM | POA: Diagnosis not present

## 2013-04-20 DIAGNOSIS — I209 Angina pectoris, unspecified: Secondary | ICD-10-CM

## 2013-04-20 DIAGNOSIS — R222 Localized swelling, mass and lump, trunk: Secondary | ICD-10-CM | POA: Insufficient documentation

## 2013-04-20 DIAGNOSIS — J449 Chronic obstructive pulmonary disease, unspecified: Secondary | ICD-10-CM | POA: Diagnosis not present

## 2013-04-20 DIAGNOSIS — R0989 Other specified symptoms and signs involving the circulatory and respiratory systems: Secondary | ICD-10-CM | POA: Insufficient documentation

## 2013-04-20 DIAGNOSIS — I272 Pulmonary hypertension, unspecified: Secondary | ICD-10-CM

## 2013-04-20 DIAGNOSIS — Z01810 Encounter for preprocedural cardiovascular examination: Secondary | ICD-10-CM

## 2013-04-20 DIAGNOSIS — R0609 Other forms of dyspnea: Secondary | ICD-10-CM | POA: Diagnosis not present

## 2013-04-20 DIAGNOSIS — D381 Neoplasm of uncertain behavior of trachea, bronchus and lung: Secondary | ICD-10-CM

## 2013-04-20 DIAGNOSIS — E119 Type 2 diabetes mellitus without complications: Secondary | ICD-10-CM | POA: Insufficient documentation

## 2013-04-20 DIAGNOSIS — I509 Heart failure, unspecified: Secondary | ICD-10-CM | POA: Diagnosis not present

## 2013-04-20 DIAGNOSIS — G4733 Obstructive sleep apnea (adult) (pediatric): Secondary | ICD-10-CM | POA: Diagnosis not present

## 2013-04-20 DIAGNOSIS — I2789 Other specified pulmonary heart diseases: Secondary | ICD-10-CM | POA: Insufficient documentation

## 2013-04-20 DIAGNOSIS — Z6841 Body Mass Index (BMI) 40.0 and over, adult: Secondary | ICD-10-CM | POA: Insufficient documentation

## 2013-04-20 DIAGNOSIS — E785 Hyperlipidemia, unspecified: Secondary | ICD-10-CM | POA: Diagnosis not present

## 2013-04-20 DIAGNOSIS — I10 Essential (primary) hypertension: Secondary | ICD-10-CM

## 2013-04-20 DIAGNOSIS — Z79899 Other long term (current) drug therapy: Secondary | ICD-10-CM | POA: Insufficient documentation

## 2013-04-20 DIAGNOSIS — J4489 Other specified chronic obstructive pulmonary disease: Secondary | ICD-10-CM | POA: Insufficient documentation

## 2013-04-20 HISTORY — PX: CARDIAC CATHETERIZATION: SHX172

## 2013-04-20 HISTORY — PX: LEFT AND RIGHT HEART CATHETERIZATION WITH CORONARY ANGIOGRAM: SHX5449

## 2013-04-20 LAB — GLUCOSE, CAPILLARY
Glucose-Capillary: 130 mg/dL — ABNORMAL HIGH (ref 70–99)
Glucose-Capillary: 138 mg/dL — ABNORMAL HIGH (ref 70–99)

## 2013-04-20 LAB — POCT ACTIVATED CLOTTING TIME: Activated Clotting Time: 170 seconds

## 2013-04-20 SURGERY — LEFT AND RIGHT HEART CATHETERIZATION WITH CORONARY ANGIOGRAM
Anesthesia: LOCAL

## 2013-04-20 MED ORDER — ONDANSETRON HCL 4 MG/2ML IJ SOLN: 4.0000 mg | Freq: Four times a day (QID) | INTRAMUSCULAR | Status: DC | PRN

## 2013-04-20 MED ORDER — LIDOCAINE HCL (PF) 1 % IJ SOLN
INTRAMUSCULAR | Status: AC
  Filled 2013-04-20: qty 30

## 2013-04-20 MED ORDER — NITROGLYCERIN 0.2 MG/ML ON CALL CATH LAB
INTRAVENOUS | Status: AC
  Filled 2013-04-20: qty 1

## 2013-04-20 MED ORDER — ASPIRIN EC 81 MG PO TBEC: 81.0000 mg | DELAYED_RELEASE_TABLET | Freq: Every day | ORAL | Status: DC

## 2013-04-20 MED ORDER — SODIUM CHLORIDE 0.9 % IV SOLN
INTRAVENOUS | Status: DC
  Administered 2013-04-20: 09:00:00 via INTRAVENOUS

## 2013-04-20 MED ORDER — HEPARIN (PORCINE) IN NACL 2-0.9 UNIT/ML-% IJ SOLN
INTRAMUSCULAR | Status: AC
  Filled 2013-04-20: qty 1000

## 2013-04-20 MED ORDER — SODIUM CHLORIDE 0.9 % IJ SOLN: 3.0000 mL | Freq: Two times a day (BID) | INTRAMUSCULAR | Status: DC

## 2013-04-20 MED ORDER — MORPHINE SULFATE 2 MG/ML IJ SOLN: 2.0000 mg | INTRAMUSCULAR | Status: DC | PRN

## 2013-04-20 MED ORDER — VERAPAMIL HCL 2.5 MG/ML IV SOLN
INTRAVENOUS | Status: AC
  Filled 2013-04-20: qty 2

## 2013-04-20 MED ORDER — ACETAMINOPHEN 325 MG PO TABS: 650.0000 mg | ORAL_TABLET | ORAL | Status: DC | PRN

## 2013-04-20 MED ORDER — METOPROLOL TARTRATE 12.5 MG HALF TABLET: 12.5000 mg | ORAL_TABLET | Freq: Two times a day (BID) | ORAL | Status: DC

## 2013-04-20 MED ORDER — SODIUM CHLORIDE 0.9 % IV SOLN: 1.0000 mL/kg/h | INTRAVENOUS | Status: DC

## 2013-04-20 MED ORDER — MIDAZOLAM HCL 2 MG/2ML IJ SOLN
INTRAMUSCULAR | Status: AC
  Filled 2013-04-20: qty 2

## 2013-04-20 MED ORDER — NITROGLYCERIN IN D5W 200-5 MCG/ML-% IV SOLN
INTRAVENOUS | Status: AC
  Filled 2013-04-20: qty 250

## 2013-04-20 MED ORDER — FENTANYL CITRATE 0.05 MG/ML IJ SOLN
INTRAMUSCULAR | Status: AC
  Filled 2013-04-20: qty 2

## 2013-04-20 MED ORDER — HEPARIN SODIUM (PORCINE) 1000 UNIT/ML IJ SOLN
INTRAMUSCULAR | Status: AC
  Filled 2013-04-20: qty 1

## 2013-04-20 NOTE — CV Procedure (Signed)
CARDIAC CATHETERIZATION REPORT  NAME:  Andres Taylor   MRN: [id number] DOB:  08-Jun-1946   ADMIT DATE: 04/20/2013 Procedure Date: 04/20/2013  INTERVENTIONAL CARDIOLOGIST: Marykay Lex, M.D., MS PRIMARY CARE PROVIDER: Alysia Penna, MD PRIMARY CARDIOLOGIST: Bryan Lemma, W., MD  PATIENT:  Andres Taylor is a 67 y.o. male seen for Class III dyspnea symptoms that could not be explained by PFTs & Echo.  He has HTN & obesity/OSA as well as long smoking history.  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   Due to the extent of his dyspnea, elevated PA pressures on Echo & significant coronary calcification on CT of Chest, he is referred for diagnostic R&LHC +/- PCI.  PRE-OPERATIVE DIAGNOSIS:    Class III Dyspnea - concern for Anginal Equivalent  Moderate Pulmonary HTN  PROCEDURES PERFORMED:    LEFT & RIGHT CATHETERIZATION WITH NATIVE CORONARY ANGIOGRAPHY  PROCEDURE:Consent:  Risks of procedure as well as the alternatives and risks of each were explained to the (patient/caregiver).  Consent for procedure obtained. Consent for signed by MD and patient with RN witness -- placed on chart.   PROCEDURE: The patient was brought to the 2nd Floor [facility] in the fasting state and prepped and draped in the usual sterile fashion for Right groin or radial as well as Right IJV access. A modified Allen's test with plethysmography was performed, revealing excellent Ulnar artery collateral flow.  Sterile technique was used including antiseptics, cap, gloves, gown, hand hygiene, mask and sheet.  Skin prep: Chlorhexidine.  Time Out: Verified patient identification, verified procedure, site/side was marked, verified correct patient position, special equipment/implants available, medications/allergies/relevent history reviewed, required imaging and test results available.  Performed  Access:   Right Radial Artery; 6 Fr Sheath --  Seldinger technique (Angiocath Micropuncture Kit)  Radial Cocktail, IV Heparin3  Right Internal Jugular Vein: 7 Fr Sheath- Ultrasound Guided, modified Seldinger Technique using Micropuncture Kit.  Right Heart Catheterization:  Through the R IJ sheath, a 7 Fr Theone Murdoch Catheter was advanced through the sheath, and with the balloon inflated once in the SVC, was advanced under fluoroscopic guidance into the first the Right Atrium, then through the Right Ventricle into the Main Pulmonary Artery and into the Wedge position.  Hemodynamics measurements were obtained in each location. Simultaneous Oxygen saturation were recorded in both the Pulmonary Artery and the Central Aorta.  Simultaneous pressure measurements were obtained in the PA/PCWP and RV along with LV.  Thermodilution injections were performed to calculate Cardiac Output.    Then the catheter was removed completely out of the body with the balloon deflated.  The Sheath was flushed with heparinized saline.  Diagnostic Left Heart Catheterization:  TIG 4.0, (JL 3.5, JR 4, Angled Pigtail)  Right Coronary Artery Angiography: JR 4  LV Hemodynamics: JR 4; LV Gram not performed, recent Echo done.  Left Coronary Artery Angiography: JL 3.5  Sheaths:  R Radial: removed with TR Band:  11:15 Hours, 12 mL air  RIJ Sheath: to be removed in PACU holding area with manual pressure for hemostasis.  MEDICATIONS:  Anesthesia:  Local Lidocaine 2 ml - radia; 10 ml RIJ  Sedation:  1 mg IV Versed, 25 mcg IV fentanyl ;   Premedication: 2 mg Valium  Omnipaque Contrast: 65 ml  Anticoagulation:  IV Heparin 6000 Units   FINDINGS:  Hemodynamics:  Findings:   SaO2%  Pressures mmHg  Mean P  mmHg  EDP  mmHg  Right Atrium   16/15  14  Right Ventricle   33/12  15   Pulmonary Artery  61 31/20  25   PCWP   19/17 16    Central Aortic  91 108/69 88   Left Ventricle   105/15  19         Cardiac Output:   Cardiac Index:    Fick  4.94  2.16     Thermodilution  4.94  2.16    Coronary Anatomy:  Left Main: Large Caliber vessel that bifurcates into the LAD and dominant Large Caliber Circumflex.  There is a small, insignificant Ramus branch.  LAD: Large caliber vessel that courses around the apex.  Minimal lumina irregularities as it tapers down to the apex.  There are 3 small caliber diagonal branches.   Left Circumflex: Very large caliber vessel that bifurcates in the mid vessel into a Large lateral OM and the AV Groove circumflex that gives off a small distal OM/LPL before bifurcating into the LPL & LPDA.  Minimal luminal irregularities.   RCA: Moderate to large caliber, non-dominant vessel, minimal luminal irregularities. There is a RV Marginal branch and the main vessel does course to the interventricular groove.  PATIENT DISPOSITION:    The patient was transferred to the PACU holding area in a hemodynamicaly stable, chest pain free condition.  The patient tolerated the procedure well, and there were no complications.  EBL:   < 10 ml  The patient was stable before, during, and after the procedure.  POST-OPERATIVE DIAGNOSIS:    Angiographically normal coronary arteries in a Left Dominant System  Mildly elevated LVEDP with known Diastolic Dysfunction  Low normal to borderline low Cardiac Output & Cardiac Index by both Fick & Thermodilution  PLAN OF CARE:  Standard post Radial & IJ cath care  D/c later today.  Will need BP control.   Marykay Lex, M.D., M.S. [facility] [address] [place], [place]  [address]  [phone]  04/20/2013 11:11 AM

## 2013-04-20 NOTE — H&P (View-Only) (Signed)
 PATIENT: Andres Taylor MRN: [id number]  DOB: 07/07/1946   DOV:04/18/2013 PCP: HOLWERDA, SCOTT, MD  Clinic Note: Chief Complaint  Patient presents with  . New Evaluation    DOE, no chest pain, edema, dx rgt upper lobe bengnin nodule  grown to 1/2 size previouslywhich surgery schedule for 05/01/2013, Dr henderickson to do, PCP referred for doe- concernred to do surgery until he find out about DOE ;;had ECHO 02/2013,pft   HPI: Andres Taylor is a 67 y.o. male with a PMH below who presents today for preoperative cardiac evaluation for lung resection surgery.  Interval History: He is a morbidly obese gentleman with hypertension, and hyperlipidemia and type 2 diabetes with COPD from long sitting standing history of smoking.  He quit in 2004.  He is currently undergoing evaluation for a right upper lobe lung nodule that is concerning for possible lung cancer.  He's been seen by Dr. Hendrickson from CT surgery.  He was referred for cardiac evaluation and D2 significantly worsening dyspnea on exertion as well as lower extremity edema.  As part of his evaluation he has had PFTs and an echocardiogram performed both of which do not provide adequate data to suggest a clear etiology for his exertional dyspnea.  Concerning is that his shortness of breath with exertion is gotten to the point where he is barely able to walk around the room in his house without having significant dyspnea.  He denies any PND, orthopnea.  He also denies any chest pressure or discomfort with rest or exertion, but really hasn't pushed himself beyond the initial dyspnea before stop and catch his breath.  He recounts that time basically walking up 100 yards or less on level ground a short of breath.  If he tries to do any steps or slope, he is not able.  He went a baseball game recently and had to use a golf cart to get to where he needed to sit.  He does have 1-2+ edema bilaterally that is relatively chronic. It has gotten somewhat better with  the addition of furosemide.  However this is not necessarily help his dyspnea.  The remainder of Cardiovascular ROS: positive for - dyspnea on exertion, edema and shortness of breath negative for - chest pain, irregular heartbeat, loss of consciousness, murmur, orthopnea, palpitations, paroxysmal nocturnal dyspnea or rapid heart rate: Additional cardiac review of systems: Lightheadedness - no, dizziness - no, syncope/near-syncope - no; TIA/amaurosis fugax - no Melena - no, hematochezia no; hematuria - no; nosebleeds - no; claudication - no   Past Medical History  Diagnosis Date  . Hypertension   . Gout   . Arthritis   . Sciatica   . Pneumonitis     right lung  . Leg edema   . Diabetes mellitus   . Allergic rhinitis   . Blood in urine   . Calculus of kidney   . DDD (degenerative disc disease)   . Eustachian tube dysfunction   . Hyperlipidemia   . Obesity   . Spinal stenosis   . Joint pain, knee   . Fatty liver    Prior Cardiac Evaluation and Past Surgical History: Past Surgical History  Procedure Laterality Date  . Lumbar disectomy l4-5  1980  . Hemorrhoid surgery  2001  . Appendectomy  1976  . Lithotripsy    . Transthoracic echocardiogram  02/2013    EF 65-70%, moderate LVH (basal septal hypertrophy); normal wall motion; mild LA Dilation; PA pressures 47 mmHg   No Known Allergies    Current Outpatient Prescriptions  Medication Sig Dispense Refill  . aspirin EC 81 MG tablet Take 81 mg by mouth daily.      . cetirizine (ZYRTEC) 10 MG tablet Take 10 mg by mouth daily.      . colchicine 0.6 MG tablet Take 0.6 mg by mouth 2 (two) times daily. PRN for Gout      . diclofenac (VOLTAREN) 75 MG EC tablet Take 75 mg by mouth 2 (two) times daily.       . furosemide (LASIX) 40 MG tablet Take 40 mg by mouth daily.       . gabapentin (NEURONTIN) 300 MG capsule Take 300 mg by mouth 3 (three) times daily.       . JANUMET 50-1000 MG per tablet Take 1 tablet by mouth 2 (two) times daily  with a meal.       . lisinopril (PRINIVIL,ZESTRIL) 20 MG tablet Take 20 mg by mouth daily.       . metoprolol tartrate (LOPRESSOR) 25 MG tablet Take 0.5 tablets (12.5 mg total) by mouth 2 (two) times daily.  30 tablet  6  . mometasone (NASONEX) 50 MCG/ACT nasal spray Place 2 sprays into the nose as needed.        No current facility-administered medications for this visit.    History   Social History Narrative   He is process separation process/divorce.  He has 2 adult children   He lives alone in [place].  He works in commercial real estate broker.   He quit smoking in 2004 after 50+ pack year smoking history.  He occasionally drinks alcohol.   Family History: family history includes Breast cancer in his maternal grandmother; Hypertension in his maternal grandfather; Leukemia in his father.  ROS: A comprehensive Review of Systems - Negative except The dyspnea and edema noted above.  All other systems are negative.  Notably he denies any coughing or wheezing or hemoptysis.  He denies any claudication symptoms or TIA amaurosis fugax symptoms.  No headaches or blurred vision.    PHYSICAL EXAM BP 128/70  Ht 5' 8" (1.727 m)  Wt 266 lb 12.8 oz (121.02 kg)  BMI 40.58 kg/m2 General appearance: alert & oriented x 3, cooperative, appears stated age, no distress and morbidly obese; well groomed Neck: no adenopathy, no carotid bruit and no JVD --but very difficult to determine due to body habitus. Lungs: clear to auscultation bilaterally, normal percussion bilaterally and non-labored Heart: regular rate and rhythm, S1, S2 normal, no murmur, click, rub or gallop ; unable to palpate PMI Abdomen: soft, non-tender; bowel sounds normal; no masses,  no organomegaly; morbidly obese, no ascites Extremities: extremities normal, atraumatic, no cyanosis, and edema 2+ right lower leg, 1+ left. Pulses: 2+ and symmetric; Skin: normal or No significant rashes or lesions.  Neurologic: Mental status: Alert,  oriented, thought content appropriate Cranial nerves: normal (II-XII grossly intact)  EKG:Performed today: Yes Rate: of 75 , Rhythm:  NSR, normal ECG;   Recent Labs:  04/18/2013  CMP: potassium 4.9 chloride 110 bicarbonate 26.  BUN/creatinine 18/0.9.  Glucose 138 (high); LFTs normal.  CBC notable for H&H of 14.0/45, platelets 190.    Cholesterol Panel: Total cholesterol 1:15, cholesterol 61, HDL 37, LDL 66. --Excellent control.  A1c 5.4  Recent Studies:  CTA -PE : Negative for PE, significant coronary calcifications.  Mild aortic dilatation and roughly 4 cm. hazy bilateral airspace opacities suggestive of bronchopneumonia.  This could also be interstitial lung disease.  Bilateral lung nodules   noted the largest in the right upper lobe of 2.4 cm. -- Biopsy pending; subcentimeter left lung basal nodule.  2-D Echocardiogram: EF 65-70%, moderate LVH with grade 1 diastolic dysfunction.  PA pressure is estimated 45-50 mmHg (47 mmHg); and no wall motion abnormalities.  ASSESSMENT / PLAN: Exertional dyspnea I am quite concerned in this morbidly obese gentleman with diabetes, hypertension and dyslipidemia (albeit relatively well controlled) is now having profound exertional dyspnea.  He is very limited, and his symptoms are relatively consistent with class III dyspnea on exertion.  In a diabetic patient I am concerned this could potentially be his anginal equivalent.  He has had an echocardiogram that showed some diastolic dysfunction, but not enough to suggest/explain his current symptoms.  He also had some moderate pulmonary hypertension, which also would not be consistent with his symptoms.  Certainly there is a compound of possible obesity ventilation syndrome, but this was not suggested by PFTs.  At this point I feel that we cannot exclude this level of exertional dyspnea being class III anginal equivalent.  Plan: Right and Left Heart Catheterization right heart catheterization will allow him  adequate assessment of his pulmonary pressures to determine the severity of his moment hypertension.  This will also determine if there is any left-sided component involved with it..  coronary angiography will be to assess potential obstructive CAD especially in light of his coronary calcification noted on CT scan.    Angina, class III He has profound dyspnea on exertion as noted above.  He does not however have any anginal chest pain type symptoms.  I am however concerned that the dyspnea on exertion may very well be his anginal.  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  Plan: Right and Left Heart Catheterization  Pulmonary HTN With PA pressures estimated in the 4050 mm mercury range, this could potentially be an underestimation.  This is likely related to obesity LSA and even potentially left-sided diastolic dysfunction.  With his upcoming operation being planned, I think accurate assessment of his PA pressures is warranted as part of his preop evaluation.  Plan: Right Heart Catheterization in Conjunction with Left Heart Catheterization.   Pre-operative cardiovascular examination He does have relative the urgent need for him undergoing lung nodule resection.  However, his major limiting issue analysis exertional dyspnea.  Dr. Anderson and Dr. Holwerda have evaluated this with an echocardiogram and PFTs, and is not sufficient data to suggest an obvious culprit.  Prior to proceeding with a at least moderate if not high risk surgery, I think is prudent that we delineate his coronary anatomy given the calcification noted on chest CT in light of his significant exertional dyspnea.  Plan: Right and Left Heart Catheterization via the Right Radial and Right IJ Approach- scheduled for October 9.  Hypertension Well-controlled on current medications including ACE inhibitor which is appropriate for his diabetes.  Well as low-dose beta blocker for clear  evidence of CAD.  He'll use a beta 1 selective agent given his lung disease.  Metoprolol tartrate 12.5 mg twice a day  Leg edema Currently this has improved with the addition of furosemide.  We'll get a good assessment of his pulmonary pressures and volume status during the right heart catheterization.  We'll also get a better sense as to what the true etiology of his edema is.  Hyperlipidemia - well-controlled Very excellent control by last laboratory check.  He is actually, not on staff and   with such good control.  Depending or wheezing cardiac catheterization I may still want to have him on low-dose statin.  Severe obesity (BMI >= 40) In upcoming visits, we'll definitely do spend more time counseling him on his weight and need for weight loss.  I did briefly talk about adjusting his dietary intake and try to get exercise.  However until we get the dyspnea on exertion issue resolved, exercise will not be occurring.  Other and unspecified coagulation defects Precatheterization coagulation panel   Orders Placed This Encounter  Procedures  . Basic metabolic panel  . CBC  . PTT  . Protime-INR  . EKG 12-Lead  . LEFT AND RIGHT HEART CATHETERIZATION WITH CORONARY ANGIOGRAM   Meds ordered this encounter  Medications  . aspirin EC 81 MG tablet    Sig: Take 81 mg by mouth daily.  . metoprolol tartrate (LOPRESSOR) 25 MG tablet    Sig: Take 0.5 tablets (12.5 mg total) by mouth 2 (two) times daily.    Dispense:  30 tablet    Refill:  6    Followup:  post cath 1-2 weeks  DAVID W. HARDING, M.D., M.S. [facility] [address] [place], [place]  [address]  [phone] Pager # [phone]     

## 2013-04-20 NOTE — Interval H&P Note (Signed)
History and Physical Interval Note:  04/20/2013 8:19 AM  Andres Taylor  has presented today for surgery, with the diagnosis of Chest pain  The various methods of treatment have been discussed with the patient and family. After consideration of risks, benefits and other options for treatment, the patient has consented to  Procedure(s): LEFT AND RIGHT HEART CATHETERIZATION WITH CORONARY ANGIOGRAM (N/A) as a surgical intervention .  [name] history has been reviewed, patient examined, no change in status, stable for surgery.  I have reviewed the patient's chart and labs.  Questions were answered to the patient's satisfaction.     Nabeeha Badertscher W Cath Lab Visit (complete for each Cath Lab visit)  Clinical Evaluation Leading to the Procedure:   ACS: no  Non-ACS:    Anginal Classification: CCS III  Anti-ischemic medical therapy: Minimal Therapy (1 class of medications)  Non-Invasive Test Results: No non-invasive testing performed  Prior CABG: No previous CABG

## 2013-04-20 NOTE — Interval H&P Note (Signed)
History and Physical Interval Note:  04/20/2013 8:19 AM  Andres Taylor  has presented today for surgery, with the diagnosis of Exertional Dyspnea - Class III (Class III Angina) The various methods of treatment have been discussed with the patient and family. After consideration of risks, benefits and other options for treatment, the patient has consented to  Procedure(s): LEFT AND RIGHT HEART CATHETERIZATION WITH CORONARY ANGIOGRAM (N/A) as a surgical intervention .  [name] history has been reviewed, patient examined, no change in status, stable for surgery.  I have reviewed the patient's chart and labs.  Questions were answered to the patient's satisfaction.     Lanah Steines W

## 2013-04-21 LAB — POCT I-STAT 3, VENOUS BLOOD GAS (G3P V)
O2 Saturation: 61 %
pCO2, Ven: 49.1 mmHg (ref 45.0–50.0)
pH, Ven: 7.364 — ABNORMAL HIGH (ref 7.250–7.300)
pO2, Ven: 33 mmHg (ref 30.0–45.0)

## 2013-04-21 LAB — POCT I-STAT 3, ART BLOOD GAS (G3+)
Acid-Base Excess: 1 mmol/L (ref 0.0–2.0)
O2 Saturation: 91 %
pO2, Arterial: 63 mmHg — ABNORMAL LOW (ref 80.0–100.0)

## 2013-04-21 LAB — POCT ACTIVATED CLOTTING TIME: Activated Clotting Time: 191 seconds

## 2013-04-21 NOTE — Progress Notes (Signed)
Results for pre- cath

## 2013-04-24 ENCOUNTER — Encounter (HOSPITAL_COMMUNITY): Payer: Self-pay | Admitting: Pharmacy Technician

## 2013-04-25 ENCOUNTER — Telehealth: Payer: Self-pay | Admitting: *Deleted

## 2013-04-25 NOTE — Telephone Encounter (Signed)
Andres Taylor has called me a couple of times with concerns of his lung status/SOBOE.  He is uncomfortable with proceeding with surgery without further workup of his lung function.  He said the cardiologists even asked him if he had had a sleep study, which he would like done.  I said he should see his PCP again and get this ordered and even a consult to a pulmonologist if warranted.  He agreed.  He will keep [place] posted.  I will inform Dr. Dorris Fetch of this and the probable postponement of his surgery.

## 2013-04-26 ENCOUNTER — Other Ambulatory Visit: Payer: Self-pay | Admitting: *Deleted

## 2013-04-27 ENCOUNTER — Inpatient Hospital Stay (HOSPITAL_COMMUNITY): Admission: RE | Admit: 2013-04-27 | Payer: Medicare Other | Source: Ambulatory Visit

## 2013-05-01 ENCOUNTER — Inpatient Hospital Stay (HOSPITAL_COMMUNITY)
Admission: RE | Admit: 2013-05-01 | Payer: Medicare Other | Source: Ambulatory Visit | Admitting: Thoracic Surgery (Cardiothoracic Vascular Surgery)

## 2013-05-01 ENCOUNTER — Encounter (HOSPITAL_COMMUNITY): Admission: RE | Payer: Self-pay | Source: Ambulatory Visit

## 2013-05-01 ENCOUNTER — Ambulatory Visit (HOSPITAL_BASED_OUTPATIENT_CLINIC_OR_DEPARTMENT_OTHER): Payer: Medicare Other | Attending: Internal Medicine | Admitting: Radiology

## 2013-05-01 VITALS — Ht 68.0 in | Wt 261.0 lb

## 2013-05-01 DIAGNOSIS — G4733 Obstructive sleep apnea (adult) (pediatric): Secondary | ICD-10-CM | POA: Diagnosis not present

## 2013-05-01 DIAGNOSIS — G4730 Sleep apnea, unspecified: Secondary | ICD-10-CM

## 2013-05-01 SURGERY — VIDEO ASSISTED THORACOSCOPY (VATS)/WEDGE RESECTION
Anesthesia: General | Site: Chest | Laterality: Right

## 2013-05-02 ENCOUNTER — Encounter: Payer: Self-pay | Admitting: Cardiology

## 2013-05-02 ENCOUNTER — Telehealth: Payer: Self-pay | Admitting: Internal Medicine

## 2013-05-02 DIAGNOSIS — G4730 Sleep apnea, unspecified: Secondary | ICD-10-CM | POA: Diagnosis not present

## 2013-05-02 NOTE — Telephone Encounter (Signed)
I have forwarded message to [facility] as reminder.

## 2013-05-02 NOTE — Telephone Encounter (Signed)
Per CY-he read this sleep study at lunch time today. I will call and speak directly with Andres Taylor in the morning.

## 2013-05-03 NOTE — Telephone Encounter (Signed)
[facility] with [facility] at [facility] that read the [facility] yesterday at lunch. Nothing more needed; will sign off on message.

## 2013-05-03 NOTE — Procedures (Signed)
[name], Andres Taylor                ACCOUNT NO.:  [id number]  MEDICAL RECORD NO.:  [id number]          PATIENT TYPE:  OUT  LOCATION:  SLEEP CENTER                 FACILITY:  [facility]  PHYSICIAN:  Denay Pleitez D. Maple Hudson, MD, FCCP, FACPDATE OF BIRTH:  Oct 08, 1945  DATE OF STUDY:  05/01/2013                           NOCTURNAL POLYSOMNOGRAM  REFERRING PHYSICIAN:  SCOTT HOLWERDA  INDICATION FOR STUDY:  Hypersomnia with sleep apnea.  EPWORTH SLEEPINESS SCORE:  10/24.  BMI 38.5.  Weight 261 pounds, height 69 inches, neck 19.5 inches.  MEDICATIONS:  Home medications are charted for review.  SLEEP ARCHITECTURE:  Total sleep time 174.5 minutes with sleep efficiency 45.6%.  Stage I was 10.9%, stage II 65.3%, stage III absent, REM 23.8% of total sleep time.  Sleep latency 12 minutes, REM latency 324.5 minutes.  Awake after sleep onset 191.5 minutes.  Arousal index 29.2.  BEDTIME MEDICATION:  None.  Sleep onset was at about 10:45 p.m.  He awoke spontaneously around 1:15 a.m. and remained awake until about 3:45 a.m. before finally regaining sleep.  RESPIRATORY DATA:  Apnea-hypopnea index (AHI) 13.4 per hour.  A total of 39 events was scored including 2 obstructive apneas, 1 central apnea, 1 mixed apnea.  Events were associated with nonsupine sleep position and with REM.  REM AHI 46.3 per hour.  Events developed too late to meet protocol requirements for application of split protocol CPAP titration on this study.  Events were insignificant until development of sustained sleep starting around 3:45 a.m. and most of the subsequent sleep time was REM.  OXYGEN DATA:  Moderately loud snoring with oxygen desaturation to a nadir of 77% and mean oxygen saturation through the study of 94% on room air.  CARDIAC DATA:  Sinus rhythm with PVCs and PACs.  MOVEMENT-PARASOMNIA:  A total of 24 limb jerks were counted, of which 9 were associated with arousals or awakening for periodic limb movement with arousal  index of 3.1 per hour.  IMPRESSIONS-RECOMMENDATIONS: 1. Mild obstructive sleep apnea/hypopnea syndrome, AHI 13.4 per hour.     Moderately loud snoring with oxygen desaturation to a nadir of 77%     and mean oxygen saturation through the study of 94% on room air. 2. The patient was spontaneously awake between 1:15 and 3:45 a.m.     Respiratory events developed late and protocol requirements for     split protocol CPAP titration were not achieved.     [name] D. Maple Hudson, MD, [facility], FACP Diplomate, American Board of Sleep Medicine    CDY/MEDQ  D:  05/02/2013 13:18:33  T:  05/03/2013 16:10:96  Job:  [id number]

## 2013-05-11 DIAGNOSIS — G4733 Obstructive sleep apnea (adult) (pediatric): Secondary | ICD-10-CM | POA: Diagnosis not present

## 2013-05-11 DIAGNOSIS — J069 Acute upper respiratory infection, unspecified: Secondary | ICD-10-CM | POA: Diagnosis not present

## 2013-05-11 DIAGNOSIS — B029 Zoster without complications: Secondary | ICD-10-CM | POA: Diagnosis not present

## 2013-06-29 DIAGNOSIS — R609 Edema, unspecified: Secondary | ICD-10-CM | POA: Diagnosis not present

## 2013-06-29 DIAGNOSIS — G4733 Obstructive sleep apnea (adult) (pediatric): Secondary | ICD-10-CM | POA: Diagnosis not present

## 2013-06-29 DIAGNOSIS — I10 Essential (primary) hypertension: Secondary | ICD-10-CM | POA: Diagnosis not present

## 2013-06-29 DIAGNOSIS — E1149 Type 2 diabetes mellitus with other diabetic neurological complication: Secondary | ICD-10-CM | POA: Diagnosis not present

## 2013-06-29 DIAGNOSIS — R0609 Other forms of dyspnea: Secondary | ICD-10-CM | POA: Diagnosis not present

## 2013-06-29 DIAGNOSIS — G609 Hereditary and idiopathic neuropathy, unspecified: Secondary | ICD-10-CM | POA: Diagnosis not present

## 2013-06-29 DIAGNOSIS — Z23 Encounter for immunization: Secondary | ICD-10-CM | POA: Diagnosis not present

## 2013-06-29 DIAGNOSIS — J984 Other disorders of lung: Secondary | ICD-10-CM | POA: Diagnosis not present

## 2013-07-18 ENCOUNTER — Encounter: Payer: Self-pay | Admitting: Thoracic Surgery (Cardiothoracic Vascular Surgery)

## 2013-07-18 ENCOUNTER — Ambulatory Visit (INDEPENDENT_AMBULATORY_CARE_PROVIDER_SITE_OTHER): Payer: Medicare Other | Admitting: Thoracic Surgery (Cardiothoracic Vascular Surgery)

## 2013-07-18 ENCOUNTER — Other Ambulatory Visit: Payer: Self-pay | Admitting: *Deleted

## 2013-07-18 VITALS — BP 161/86 | HR 77 | Resp 16 | Ht 68.0 in | Wt 255.0 lb

## 2013-07-18 DIAGNOSIS — D381 Neoplasm of uncertain behavior of trachea, bronchus and lung: Secondary | ICD-10-CM | POA: Diagnosis not present

## 2013-07-18 DIAGNOSIS — R911 Solitary pulmonary nodule: Secondary | ICD-10-CM

## 2013-07-18 NOTE — Progress Notes (Signed)
HPI:  [name] returns today to discuss further management of his right upper lobe mass.  [name] is a 68 year old gentleman for saw back in October regarding a right upper lobe mass. He's had this mass for at least 10 years. It had been biopsied about 10 years ago and showed benign fibrous tissue. In October he was being evaluated for shortness of breath area the chest x-ray showed the mass and a CT showed the mass had increased in size now being 2.2 x 2.6 cm. His pulmonary function testing showed some mild obstructive and restrictive changes. He had a cardiac workup including catheterization by Dr. Ellyn Hack which showed normal coronary arteries. He also had a sleep study which showed some apneic spells but not enough to warrant CPAP.  He says his shortness of breath has gotten better, but has not resolved. He's not having chest pain. It is still exertional and is worse with inclines. He says he has lost a little bit of weight intentionally. He does not have any significant cough or hemoptysis. He does have joint pain. He has not had a history of excessive bleeding. All other systems are negative.  Past Medical History  Diagnosis Date  . Hypertension   . Gout   . Arthritis   . Sciatica   . Pneumonitis     right lung  . Leg edema   . Diabetes mellitus   . Allergic rhinitis   . Blood in urine   . Calculus of kidney   . DDD (degenerative disc disease)   . Eustachian tube dysfunction   . Hyperlipidemia   . Obesity   . Spinal stenosis   . Joint pain, knee   . Fatty liver      Current Outpatient Prescriptions  Medication Sig Dispense Refill  . aspirin EC 81 MG tablet Take 81 mg by mouth daily.      . cetirizine (ZYRTEC) 10 MG tablet Take 10 mg by mouth daily.      . colchicine 0.6 MG tablet Take 0.6 mg by mouth 2 (two) times daily as needed (for gout). PRN for Gout      . diclofenac (VOLTAREN) 75 MG EC tablet Take 75 mg by mouth 2 (two) times daily.       . furosemide (LASIX) 40  MG tablet Take 40 mg by mouth daily.       [name] gabapentin (NEURONTIN) 300 MG capsule Take 300 mg by mouth 3 (three) times daily.       [name] JANUMET 50-1000 MG per tablet Take 1 tablet by mouth 2 (two) times daily with a meal.       . lisinopril (PRINIVIL,ZESTRIL) 20 MG tablet Take 20 mg by mouth daily.       . metoprolol tartrate (LOPRESSOR) 25 MG tablet Take 0.5 tablets (12.5 mg total) by mouth 2 (two) times daily.  30 tablet  6  . mometasone (NASONEX) 50 MCG/ACT nasal spray Place 2 sprays into the nose as needed (for allergies).        No current facility-administered medications for this visit.   Family History  Problem Relation Age of Onset  . Leukemia Father   . Breast cancer Maternal Grandmother   . Hypertension Maternal Grandfather    History   Social History  . Marital Status: Legally Separated    Spouse Name: N/A    Number of Children: 2  . Years of Education: N/A   Occupational History  . Ecologist    Social  History Main Topics  . Smoking status: Former Smoker -- 1.50 packs/day for 42 years    Types: Cigarettes    Quit date: 07/13/2002  . Smokeless tobacco: Never Used  . Alcohol Use: Yes     Comment: beer  . Drug Use: No  . Sexual Activity: Not on file   Other Topics Concern  . Not on file   Social History Narrative   He is process separation process/divorce.  He has 2 adult children   He lives alone in [place].  He works in Therapist, sports.   He quit smoking in 2004 after 50+ pack year smoking history.  He occasionally drinks alcohol.    Physical Exam  Diagnostic Tests: Pulmonary function testing 03/16/2013  FVC 3.13 (75%)  FEV1 2.43 (79%)  FEV1/FVC 0.78 (104%)  SVC 3.12 (75%)  DLCO 69%  No significant change with bronchodilators  CT Chest 02/08/2013 MPRESSION: Smoothly marginated soft tissue nodule in the right upper lobe. This could represent a malignancy, but a hamartoma should be considered and with this smoothly marginated  border I think this is likely. I recommend a PET scan for further evaluation of this nodule. I recommend follow-up of the 5 mm nodule at the left lung base in 6 months with an unenhanced CT scan of the PET scan does not reveal malignancy Original Report Authenticated By: Lorriane Shire, M.D.  Impression: 68 year old gentleman with a 2.2 x 2.6 cm right upper lobe mass. The mass is smoothly marginated and has been present for many years. It most likely is benign. However given that it has grown at the would be best to remove the mass at this time.  I have discussed with Mr and Mrs Munguia the general nature of the procedure, which is right VATS, wedge resection, possible right upper lobectomy. We discussed  the need for general anesthesia, and the incisions to be used. We discussed the expected hospital stay, overall recovery and short and long term outcomes. We discussed the indications, risks, benefits, and alternatives. He understands the risks include, but are not limited to death, MI, DVT/PE, bleeding, possible need for transfusion, infections,prolonged air leaks, cardiac arrhythmias, and other organ system dysfunction including respiratory, renal, or GI complications. He accepts these risks and wishes to proceed.  He requests to have the surgery done on February 18. We will plan to repeat his chest CT prior to surgery as it has been 6 months and the radiologist recommended a repeat CT to evaluate the left lower lobe nodule at 6 months.  Plan: Right VATS, wedge resection, possible right upper lobectomy

## 2013-07-25 ENCOUNTER — Inpatient Hospital Stay: Admission: RE | Admit: 2013-07-25 | Payer: Medicare Other | Source: Ambulatory Visit

## 2013-07-26 ENCOUNTER — Ambulatory Visit
Admission: RE | Admit: 2013-07-26 | Discharge: 2013-07-26 | Disposition: A | Discharge status: Home / Self Care | Payer: Medicare Other | Source: Ambulatory Visit | Attending: Thoracic Surgery (Cardiothoracic Vascular Surgery) | Admitting: Thoracic Surgery (Cardiothoracic Vascular Surgery)

## 2013-07-26 DIAGNOSIS — R911 Solitary pulmonary nodule: Secondary | ICD-10-CM

## 2013-07-31 ENCOUNTER — Other Ambulatory Visit: Payer: Self-pay | Admitting: *Deleted

## 2013-07-31 DIAGNOSIS — R918 Other nonspecific abnormal finding of lung field: Secondary | ICD-10-CM

## 2013-08-24 ENCOUNTER — Encounter (HOSPITAL_COMMUNITY): Payer: Self-pay | Admitting: Pharmacy Technician

## 2013-08-26 NOTE — Pre-Procedure Instructions (Signed)
Andres Taylor  08/26/2013   Your procedure is scheduled on:  February 18  Report to [facility] Entr[facility] [place] at [employer] AM.  Call this number if you have problems the morning of surgery: [phone]   Remember:   Do not eat food or drink liquids after midnight.   Take these medicines the morning of surgery with A SIP OF WATER: Zyrtec, Colchicine, Gabapentin, Nasonex   STOP Aspirin today   STOP/ Do not take Aspirin, Aleve, Naproxen, Advil, Ibuprofen, Vitamin, Herbs, or Supplements starting today   Do not wear jewelry, make-up or nail polish.  Do not wear lotions, powders, or cologne. You may wear deodorant.  Do not shave 48 hours prior to surgery. Men may shave face and neck.  Do not bring valuables to the hospital.  [facility] is not responsible                  for any belongings or valuables.               Contacts, dentures or bridgework may not be worn into surgery.  Leave suitcase in the car. After surgery it may be brought to your room.  For patients admitted to the hospital, discharge time is determined by your                treatment team.               Special Instructions: See [facility] Preparing For Surgery   Please read over the following fact sheets that you were given: Pain Booklet, Coughing and Deep Breathing, Blood Transfusion Information and Surgical Site Infection Prevention

## 2013-08-26 NOTE — Pre-Procedure Instructions (Signed)
[place] - Preparing for Surgery ° °Before surgery, you can play an important role.  Because skin is not sterile, your skin needs to be as free of germs as possible.  You can reduce the number of germs on you skin by washing with CHG (chlorahexidine gluconate) soap before surgery.  CHG is an antiseptic cleaner which kills germs and bonds with the skin to continue killing germs even after washing. ° °Please DO NOT use if you have an allergy to CHG or antibacterial soaps.  If your skin becomes reddened/irritated stop using the CHG and inform your nurse when you arrive at Short Stay. ° °Do not shave (including legs and underarms) for at least 48 hours prior to the first CHG shower.  You may shave your face. ° °Please follow these instructions carefully: ° ° 1.  Shower with CHG Soap the night before surgery and the                                morning of Surgery. ° 2.  If you choose to wash your hair, wash your hair first as usual with your       normal shampoo. ° 3.  After you shampoo, rinse your hair and body thoroughly to remove the                      Shampoo. ° 4.  Use CHG as you would any other liquid soap.  You can apply chg directly       to the skin and wash gently with scrungie or a clean washcloth. ° 5.  Apply the CHG Soap to your body ONLY FROM THE NECK DOWN.        Do not use on open wounds or open sores.  Avoid contact with your eyes,       ears, mouth and genitals (private parts).  Wash genitals (private parts)       with your normal soap. ° 6.  Wash thoroughly, paying special attention to the area where your surgery        will be performed. ° 7.  Thoroughly rinse your body with warm water from the neck down. ° 8.  DO NOT shower/wash with your normal soap after using and rinsing off       the CHG Soap. ° 9.  Pat yourself dry with a clean towel. °           10.  Wear clean pajamas. °           11.  Place clean sheets on your bed the night of your first shower and do not        sleep with  pets. ° °Day of Surgery ° °Do not apply any lotions/deoderants the morning of surgery.  Please wear clean clothes to the hospital/surgery center. ° ° °

## 2013-08-28 ENCOUNTER — Encounter (HOSPITAL_COMMUNITY): Payer: Self-pay

## 2013-08-28 ENCOUNTER — Ambulatory Visit (HOSPITAL_COMMUNITY)
Admission: RE | Admit: 2013-08-28 | Discharge: 2013-08-28 | Disposition: A | Discharge status: Home / Self Care | Payer: Medicare Other | Source: Ambulatory Visit | Attending: Thoracic Surgery (Cardiothoracic Vascular Surgery) | Admitting: Thoracic Surgery (Cardiothoracic Vascular Surgery)

## 2013-08-28 ENCOUNTER — Encounter (HOSPITAL_COMMUNITY)
Admission: RE | Admit: 2013-08-28 | Discharge: 2013-08-28 | Disposition: A | Discharge status: Home / Self Care | Payer: Medicare Other | Source: Ambulatory Visit | Attending: Thoracic Surgery (Cardiothoracic Vascular Surgery) | Admitting: Thoracic Surgery (Cardiothoracic Vascular Surgery)

## 2013-08-28 VITALS — BP 145/77 | HR 73 | Temp 97.7°F | Resp 20 | Ht 68.0 in | Wt 252.5 lb

## 2013-08-28 DIAGNOSIS — Z01812 Encounter for preprocedural laboratory examination: Secondary | ICD-10-CM | POA: Insufficient documentation

## 2013-08-28 DIAGNOSIS — R918 Other nonspecific abnormal finding of lung field: Secondary | ICD-10-CM

## 2013-08-28 DIAGNOSIS — Z01818 Encounter for other preprocedural examination: Secondary | ICD-10-CM | POA: Insufficient documentation

## 2013-08-28 DIAGNOSIS — Z01810 Encounter for preprocedural cardiovascular examination: Secondary | ICD-10-CM | POA: Diagnosis not present

## 2013-08-28 DIAGNOSIS — R911 Solitary pulmonary nodule: Secondary | ICD-10-CM | POA: Diagnosis not present

## 2013-08-28 HISTORY — DX: Shortness of breath: R06.02

## 2013-08-28 LAB — COMPREHENSIVE METABOLIC PANEL
ALBUMIN: 3.7 g/dL (ref 3.5–5.2)
ALT: 35 U/L (ref 0–53)
AST: 25 U/L (ref 0–37)
Alkaline Phosphatase: 61 U/L (ref 39–117)
BILIRUBIN TOTAL: 0.4 mg/dL (ref 0.3–1.2)
BUN: 15 mg/dL (ref 6–23)
CALCIUM: 9.1 mg/dL (ref 8.4–10.5)
CO2: 23 mEq/L (ref 19–32)
Chloride: 102 mEq/L (ref 96–112)
Creatinine, Ser: 0.84 mg/dL (ref 0.50–1.35)
GFR calc Af Amer: >90 mL/min (ref >90)
GFR calc non Af Amer: 87 mL/min — ABNORMAL LOW (ref >90)
Glucose, Bld: 97 mg/dL (ref 70–99)
Potassium: 4.4 mEq/L (ref 3.7–5.3)
Sodium: 141 mEq/L (ref 137–147)
TOTAL PROTEIN: 7.1 g/dL (ref 6.0–8.3)

## 2013-08-28 LAB — ABO/RH: ABO/RH(D): A POS

## 2013-08-28 LAB — CBC
HEMATOCRIT: 41 % (ref 39.0–52.0)
Hemoglobin: 14.2 g/dL (ref 13.0–17.0)
MCH: 30.3 pg (ref 26.0–34.0)
MCHC: 34.6 g/dL (ref 30.0–36.0)
MCV: 87.4 fL (ref 78.0–100.0)
PLATELETS: 187 10*3/uL (ref 150–400)
RBC: 4.69 MIL/uL (ref 4.22–5.81)
RDW: 13.1 % (ref 11.5–15.5)
WBC: 5.2 10*3/uL (ref 4.0–10.5)

## 2013-08-28 LAB — BLOOD GAS, ARTERIAL
Acid-Base Excess: 0.7 mmol/L (ref 0.0–2.0)
Bicarbonate: 24.5 mEq/L — ABNORMAL HIGH (ref 20.0–24.0)
Drawn by: 344381
FIO2: 0.21 %
O2 SAT: 95.1 %
PATIENT TEMPERATURE: 98.6
PH ART: 7.437 (ref 7.350–7.450)
TCO2: 25.6 mmol/L (ref 0–100)
pCO2 arterial: 36.9 mmHg (ref 35.0–45.0)
pO2, Arterial: 69.5 mmHg — ABNORMAL LOW (ref 80.0–100.0)

## 2013-08-28 LAB — URINALYSIS, ROUTINE W REFLEX MICROSCOPIC
BILIRUBIN URINE: NEGATIVE
Glucose, UA: NEGATIVE mg/dL
Hgb urine dipstick: NEGATIVE
KETONES UR: NEGATIVE mg/dL
LEUKOCYTES UA: NEGATIVE
NITRITE: NEGATIVE
Protein, ur: NEGATIVE mg/dL
Specific Gravity, Urine: 1.016 (ref 1.005–1.030)
Urobilinogen, UA: 0.2 mg/dL (ref 0.0–1.0)
pH: 5.5 (ref 5.0–8.0)

## 2013-08-28 LAB — TYPE AND SCREEN
ABO/RH(D): A POS
Antibody Screen: NEGATIVE

## 2013-08-28 LAB — APTT: aPTT: 27 seconds (ref 24–37)

## 2013-08-28 LAB — SURGICAL PCR SCREEN
MRSA, PCR: NEGATIVE
Staphylococcus aureus: NEGATIVE

## 2013-08-28 LAB — PROTIME-INR
INR: 1.07 (ref 0.00–1.49)
Prothrombin Time: 13.7 seconds (ref 11.6–15.2)

## 2013-08-28 NOTE — Pre-Procedure Instructions (Signed)
Julyan Gales  08/28/2013   Your procedure is scheduled on:  Wednesday August 30, 2013 at 8:30 AM.  Report to [facility] Entrance "A" Admitting at 06:30 AM.  Call this number if you have problems the morning of surgery: [phone]   Remember:   Do not eat food or drink liquids after midnight.   Take these medicines the morning of surgery with A SIP OF WATER: Cetirizine (Zyrtec), Colchicine, Gabapentin (Neurontin), and Nasonex if needed    Do NOT take any diabetic medications the morning of your surgery   Do not wear jewelry.  Do not wear lotions, powders, or cologne. You may wear deodorant.  Men may shave face and neck.  Do not bring valuables to the hospital.  [facility] is not responsible for any belongings or valuables.               Contacts, dentures or bridgework may not be worn into surgery.  Leave suitcase in the car. After surgery it may be brought to your room.  For patients admitted to the hospital, discharge time is determined by your treatment team.               Special Instructions: See [facility] Preparing For Surgery   Please read over the following fact sheets that you were given: Pain Booklet, Coughing and Deep Breathing, Blood Transfusion Information and Surgical Site Infection Prevention    [place]- Preparing For Surgery  Before surgery, you can play an important role. Because skin is not sterile, your skin needs to be as free of germs as possible. You can reduce the number of germs on your skin by washing with CHG (chlorahexidine gluconate) Soap before surgery.  CHG is an antiseptic cleaner which kills germs and bonds with the skin to continue killing germs even after washing.  Please do not use if you have an allergy to CHG or antibacterial soaps. If your skin becomes reddened/irritated stop using the CHG.  Do not shave (including legs and underarms) for at least 48 hours prior to first CHG shower. It is OK to shave your  face.  Please follow these instructions carefully.   1. Shower the night before surgery and the morning of surgery with CHG.   2. If you chose to wash your hair, wash your hair first as usual with your normal shampoo.  3. After you shampoo, rinse your hair and body thoroughly to remove the shampoo.  4. Use CHG as you would any other liquid soap. You can apply CHG directly to the skin and wash gently with a scrungie or a clean washcloth.   5. Apply the CHG Soap to your body ONLY FROM THE NECK DOWN.  Do not use on open wounds or open sores. Avoid contact with your eyes, ears, mouth and genitals (private parts). Wash genitals (private parts) with your normal soap.  6. Wash thoroughly, paying special attention to the area where your surgery will be performed.  7. Thoroughly rinse your body with warm water from the neck down.  8. DO NOT shower/wash with your normal soap after using and rinsing off the CHG Soap.  9. Pat yourself dry with a clean towel.   10. Wear clean pajamas   11. Place clean sheets on your bed the night of your first shower and do not sleep with pets.  Day of Surgery: Do not apply any deodorants/lotions. Please wear clean clothes to the hospital/surgery center

## 2013-08-28 NOTE — Progress Notes (Signed)
During PAT visit while reviewing the sleep apnea screening, patient informed Nurse that he had a sleep study, however he was not diagnosed with sleep apnea as the study was inconclusive because he did not stay asleep long enough to determine if he had sleep apnea. Patient stated "there were so many wires and stuff that I was hooked up to and whenever I rolled over I thought I may mess something up so I just didn't sleep, so I don't know if I have sleep apnea or not." Results from sleep apnea screen sent to PCP.

## 2013-08-28 NOTE — Progress Notes (Signed)
08/28/13 1019  OBSTRUCTIVE SLEEP APNEA  Have you ever been diagnosed with sleep apnea through a sleep study? No  Do you snore loudly (loud enough to be heard through closed doors)?  1  Do you often feel tired, fatigued, or sleepy during the daytime? 1  Has anyone observed you stop breathing during your sleep? 0  Do you have, or are you being treated for high blood pressure? 1  BMI more than 35 kg/m2? 1  Age over 69 years old? 1  Neck circumference greater than 40 cm/18 inches? 1  Gender: 1  Obstructive Sleep Apnea Score 7  Score 4 or greater  Results sent to PCP   This patient has screened at risk for sleep study using the STOP Bang tool. A score of 4 or greater is at risk for sleep apnea.

## 2013-08-29 MED ORDER — DEXTROSE 5 % IV SOLN
1.5000 g | INTRAVENOUS | Status: AC
  Administered 2013-08-30: 1.5 g via INTRAVENOUS
  Filled 2013-08-29: qty 1.5

## 2013-08-30 ENCOUNTER — Inpatient Hospital Stay (HOSPITAL_COMMUNITY): Payer: Medicare Other | Admitting: Certified Registered Nurse Anesthetist

## 2013-08-30 ENCOUNTER — Inpatient Hospital Stay (HOSPITAL_COMMUNITY)
Admission: RE | Admit: 2013-08-30 | Discharge: 2013-09-01 | DRG: 165 | Disposition: A | Discharge status: Home / Self Care | Payer: Medicare Other | Source: Ambulatory Visit | Attending: Thoracic Surgery (Cardiothoracic Vascular Surgery) | Admitting: Thoracic Surgery (Cardiothoracic Vascular Surgery)

## 2013-08-30 ENCOUNTER — Encounter (HOSPITAL_COMMUNITY)
Admission: RE | Disposition: A | Discharge status: Home / Self Care | Payer: Self-pay | Source: Ambulatory Visit | Attending: Thoracic Surgery (Cardiothoracic Vascular Surgery)

## 2013-08-30 ENCOUNTER — Inpatient Hospital Stay (HOSPITAL_COMMUNITY): Payer: Medicare Other

## 2013-08-30 ENCOUNTER — Encounter (HOSPITAL_COMMUNITY): Payer: Self-pay | Admitting: *Deleted

## 2013-08-30 ENCOUNTER — Encounter (HOSPITAL_COMMUNITY): Payer: Medicare Other | Admitting: Certified Registered Nurse Anesthetist

## 2013-08-30 DIAGNOSIS — M129 Arthropathy, unspecified: Secondary | ICD-10-CM | POA: Diagnosis present

## 2013-08-30 DIAGNOSIS — Z7982 Long term (current) use of aspirin: Secondary | ICD-10-CM | POA: Diagnosis not present

## 2013-08-30 DIAGNOSIS — I10 Essential (primary) hypertension: Secondary | ICD-10-CM | POA: Diagnosis present

## 2013-08-30 DIAGNOSIS — D381 Neoplasm of uncertain behavior of trachea, bronchus and lung: Secondary | ICD-10-CM | POA: Diagnosis not present

## 2013-08-30 DIAGNOSIS — J9383 Other pneumothorax: Secondary | ICD-10-CM | POA: Diagnosis not present

## 2013-08-30 DIAGNOSIS — Z6839 Body mass index (BMI) 39.0-39.9, adult: Secondary | ICD-10-CM

## 2013-08-30 DIAGNOSIS — K7689 Other specified diseases of liver: Secondary | ICD-10-CM | POA: Diagnosis present

## 2013-08-30 DIAGNOSIS — E785 Hyperlipidemia, unspecified: Secondary | ICD-10-CM | POA: Diagnosis present

## 2013-08-30 DIAGNOSIS — D1430 Benign neoplasm of unspecified bronchus and lung: Secondary | ICD-10-CM | POA: Diagnosis not present

## 2013-08-30 DIAGNOSIS — M109 Gout, unspecified: Secondary | ICD-10-CM | POA: Diagnosis not present

## 2013-08-30 DIAGNOSIS — Z79899 Other long term (current) drug therapy: Secondary | ICD-10-CM

## 2013-08-30 DIAGNOSIS — R609 Edema, unspecified: Secondary | ICD-10-CM | POA: Diagnosis not present

## 2013-08-30 DIAGNOSIS — E119 Type 2 diabetes mellitus without complications: Secondary | ICD-10-CM | POA: Diagnosis present

## 2013-08-30 DIAGNOSIS — Z87891 Personal history of nicotine dependence: Secondary | ICD-10-CM

## 2013-08-30 DIAGNOSIS — J90 Pleural effusion, not elsewhere classified: Secondary | ICD-10-CM | POA: Diagnosis not present

## 2013-08-30 DIAGNOSIS — J984 Other disorders of lung: Secondary | ICD-10-CM | POA: Diagnosis present

## 2013-08-30 DIAGNOSIS — J811 Chronic pulmonary edema: Secondary | ICD-10-CM | POA: Diagnosis not present

## 2013-08-30 DIAGNOSIS — E669 Obesity, unspecified: Secondary | ICD-10-CM | POA: Diagnosis present

## 2013-08-30 DIAGNOSIS — R918 Other nonspecific abnormal finding of lung field: Secondary | ICD-10-CM

## 2013-08-30 DIAGNOSIS — R222 Localized swelling, mass and lump, trunk: Secondary | ICD-10-CM | POA: Diagnosis not present

## 2013-08-30 DIAGNOSIS — J9819 Other pulmonary collapse: Secondary | ICD-10-CM | POA: Diagnosis not present

## 2013-08-30 HISTORY — PX: VIDEO ASSISTED THORACOSCOPY (VATS)/WEDGE RESECTION: SHX6174

## 2013-08-30 LAB — GLUCOSE, CAPILLARY
Glucose-Capillary: 112 mg/dL — ABNORMAL HIGH (ref 70–99)
Glucose-Capillary: 130 mg/dL — ABNORMAL HIGH (ref 70–99)
Glucose-Capillary: 129 mg/dL — ABNORMAL HIGH (ref 70–99)
Glucose-Capillary: 126 mg/dL — ABNORMAL HIGH (ref 70–99)

## 2013-08-30 SURGERY — VIDEO ASSISTED THORACOSCOPY (VATS)/WEDGE RESECTION
Anesthesia: General | Site: Chest | Laterality: Right

## 2013-08-30 MED ORDER — INSULIN ASPART 100 UNIT/ML ~~LOC~~ SOLN
0.0000 [IU] | Freq: Four times a day (QID) | SUBCUTANEOUS | Status: DC
  Administered 2013-08-30 – 2013-08-31 (×3): 2 [IU] via SUBCUTANEOUS

## 2013-08-30 MED ORDER — NALOXONE HCL 0.4 MG/ML IJ SOLN: 0.4000 mg | INTRAMUSCULAR | Status: DC | PRN

## 2013-08-30 MED ORDER — GLYCOPYRROLATE 0.2 MG/ML IJ SOLN
INTRAMUSCULAR | Status: DC | PRN
  Administered 2013-08-30: 0.6 mg via INTRAVENOUS

## 2013-08-30 MED ORDER — PROPOFOL 10 MG/ML IV BOLUS
INTRAVENOUS | Status: DC | PRN
  Administered 2013-08-30: 200 mg via INTRAVENOUS

## 2013-08-30 MED ORDER — ONDANSETRON HCL 4 MG/2ML IJ SOLN
INTRAMUSCULAR | Status: DC | PRN
  Administered 2013-08-30: 4 mg via INTRAVENOUS

## 2013-08-30 MED ORDER — SODIUM CHLORIDE 0.9 % IJ SOLN: 9.0000 mL | INTRAMUSCULAR | Status: DC | PRN

## 2013-08-30 MED ORDER — ACETAMINOPHEN 500 MG PO TABS
1000.0000 mg | ORAL_TABLET | Freq: Four times a day (QID) | ORAL | Status: AC
  Administered 2013-08-30 – 2013-08-31 (×4): 1000 mg via ORAL
  Filled 2013-08-30 (×4): qty 2

## 2013-08-30 MED ORDER — NEOSTIGMINE METHYLSULFATE 1 MG/ML IJ SOLN
INTRAMUSCULAR | Status: AC
  Filled 2013-08-30: qty 10

## 2013-08-30 MED ORDER — MIDAZOLAM HCL 2 MG/2ML IJ SOLN
INTRAMUSCULAR | Status: AC
  Filled 2013-08-30: qty 2

## 2013-08-30 MED ORDER — ASPIRIN EC 81 MG PO TBEC
81.0000 mg | DELAYED_RELEASE_TABLET | Freq: Every day | ORAL | Status: DC
  Administered 2013-08-30 – 2013-09-01 (×3): 81 mg via ORAL
  Filled 2013-08-30 (×3): qty 1

## 2013-08-30 MED ORDER — HYDROMORPHONE HCL PF 1 MG/ML IJ SOLN
INTRAMUSCULAR | Status: AC
  Filled 2013-08-30: qty 2

## 2013-08-30 MED ORDER — DIPHENHYDRAMINE HCL 12.5 MG/5ML PO ELIX
12.5000 mg | ORAL_SOLUTION | Freq: Four times a day (QID) | ORAL | Status: DC | PRN
  Filled 2013-08-30: qty 5

## 2013-08-30 MED ORDER — FENTANYL CITRATE 0.05 MG/ML IJ SOLN
INTRAMUSCULAR | Status: DC | PRN
  Administered 2013-08-30 (×2): 50 ug via INTRAVENOUS
  Administered 2013-08-30: 100 ug via INTRAVENOUS
  Administered 2013-08-30: 50 ug via INTRAVENOUS
  Administered 2013-08-30: 100 ug via INTRAVENOUS
  Administered 2013-08-30: 50 ug via INTRAVENOUS

## 2013-08-30 MED ORDER — MIDAZOLAM HCL 5 MG/5ML IJ SOLN
INTRAMUSCULAR | Status: DC | PRN
  Administered 2013-08-30: 1 mg via INTRAVENOUS

## 2013-08-30 MED ORDER — FENTANYL CITRATE 0.05 MG/ML IJ SOLN
INTRAMUSCULAR | Status: AC
  Filled 2013-08-30: qty 5

## 2013-08-30 MED ORDER — HYDRALAZINE HCL 20 MG/ML IJ SOLN
INTRAMUSCULAR | Status: AC
  Filled 2013-08-30: qty 1

## 2013-08-30 MED ORDER — OXYCODONE HCL 5 MG PO TABS: 5.0000 mg | ORAL_TABLET | ORAL | Status: AC | PRN

## 2013-08-30 MED ORDER — PROPOFOL 10 MG/ML IV BOLUS
INTRAVENOUS | Status: AC
  Filled 2013-08-30: qty 20

## 2013-08-30 MED ORDER — ONDANSETRON HCL 4 MG/2ML IJ SOLN: 4.0000 mg | Freq: Four times a day (QID) | INTRAMUSCULAR | Status: DC | PRN

## 2013-08-30 MED ORDER — LISINOPRIL 20 MG PO TABS
20.0000 mg | ORAL_TABLET | Freq: Every day | ORAL | Status: DC
  Administered 2013-08-30 – 2013-09-01 (×3): 20 mg via ORAL
  Filled 2013-08-30 (×3): qty 1

## 2013-08-30 MED ORDER — NEOSTIGMINE METHYLSULFATE 1 MG/ML IJ SOLN
INTRAMUSCULAR | Status: DC | PRN
  Administered 2013-08-30: 4 mg via INTRAVENOUS

## 2013-08-30 MED ORDER — HYDROMORPHONE HCL PF 1 MG/ML IJ SOLN
0.2500 mg | INTRAMUSCULAR | Status: DC | PRN
  Administered 2013-08-30: 1 mg via INTRAVENOUS

## 2013-08-30 MED ORDER — FENTANYL 10 MCG/ML IV SOLN
INTRAVENOUS | Status: DC
  Administered 2013-08-30: 11:00:00 via INTRAVENOUS
  Administered 2013-08-30: 30 ug via INTRAVENOUS
  Administered 2013-08-30: 15 ug via INTRAVENOUS
  Administered 2013-08-31: 150 ug via INTRAVENOUS
  Administered 2013-08-31: 165 ug via INTRAVENOUS
  Administered 2013-08-31: 105 ug via INTRAVENOUS
  Administered 2013-08-31: 15:00:00 via INTRAVENOUS
  Administered 2013-08-31: 45 ug via INTRAVENOUS
  Administered 2013-08-31: 90 ug via INTRAVENOUS
  Administered 2013-09-01: 75 ug via INTRAVENOUS
  Administered 2013-09-01: 30 ug via INTRAVENOUS
  Administered 2013-09-01: 120 ug via INTRAVENOUS
  Filled 2013-08-30 (×2): qty 50

## 2013-08-30 MED ORDER — VECURONIUM BROMIDE 10 MG IV SOLR
INTRAVENOUS | Status: DC | PRN
  Administered 2013-08-30: 4 mg via INTRAVENOUS

## 2013-08-30 MED ORDER — VECURONIUM BROMIDE 10 MG IV SOLR
INTRAVENOUS | Status: AC
  Filled 2013-08-30: qty 10

## 2013-08-30 MED ORDER — OXYCODONE HCL 5 MG PO TABS: 5.0000 mg | ORAL_TABLET | Freq: Once | ORAL | Status: DC | PRN

## 2013-08-30 MED ORDER — LACTATED RINGERS IV SOLN
INTRAVENOUS | Status: DC | PRN
  Administered 2013-08-30: 08:00:00 via INTRAVENOUS

## 2013-08-30 MED ORDER — OXYCODONE-ACETAMINOPHEN 5-325 MG PO TABS
1.0000 | ORAL_TABLET | ORAL | Status: DC | PRN
  Administered 2013-08-30: 2 via ORAL

## 2013-08-30 MED ORDER — DEXTROSE-NACL 5-0.9 % IV SOLN
INTRAVENOUS | Status: DC
  Administered 2013-08-30 (×2): via INTRAVENOUS

## 2013-08-30 MED ORDER — GABAPENTIN 300 MG PO CAPS
300.0000 mg | ORAL_CAPSULE | Freq: Two times a day (BID) | ORAL | Status: DC
  Administered 2013-08-30 – 2013-09-01 (×4): 300 mg via ORAL
  Filled 2013-08-30 (×5): qty 1

## 2013-08-30 MED ORDER — OXYCODONE HCL 5 MG/5ML PO SOLN: 5.0000 mg | Freq: Once | ORAL | Status: DC | PRN

## 2013-08-30 MED ORDER — ROCURONIUM BROMIDE 50 MG/5ML IV SOLN
INTRAVENOUS | Status: AC
  Filled 2013-08-30: qty 1

## 2013-08-30 MED ORDER — POTASSIUM CHLORIDE 10 MEQ/50ML IV SOLN: 10.0000 meq | Freq: Every day | INTRAVENOUS | Status: DC | PRN

## 2013-08-30 MED ORDER — GLYCOPYRROLATE 0.2 MG/ML IJ SOLN
INTRAMUSCULAR | Status: AC
  Filled 2013-08-30: qty 3

## 2013-08-30 MED ORDER — TRAMADOL HCL 50 MG PO TABS: 50.0000 mg | ORAL_TABLET | Freq: Four times a day (QID) | ORAL | Status: DC | PRN

## 2013-08-30 MED ORDER — OXYCODONE-ACETAMINOPHEN 5-325 MG PO TABS
ORAL_TABLET | ORAL | Status: AC
Start: 2013-08-30 — End: 2013-08-30
  Filled 2013-08-30: qty 2

## 2013-08-30 MED ORDER — SODIUM CHLORIDE 0.9 % IJ SOLN
INTRAMUSCULAR | Status: AC
  Filled 2013-08-30: qty 10

## 2013-08-30 MED ORDER — ACETAMINOPHEN 160 MG/5ML PO SOLN
1000.0000 mg | Freq: Four times a day (QID) | ORAL | Status: AC
  Filled 2013-08-30 (×4): qty 40

## 2013-08-30 MED ORDER — SENNOSIDES-DOCUSATE SODIUM 8.6-50 MG PO TABS
1.0000 | ORAL_TABLET | Freq: Every evening | ORAL | Status: DC | PRN
  Filled 2013-08-30: qty 1

## 2013-08-30 MED ORDER — ROCURONIUM BROMIDE 100 MG/10ML IV SOLN
INTRAVENOUS | Status: DC | PRN
  Administered 2013-08-30: 50 mg via INTRAVENOUS

## 2013-08-30 MED ORDER — LORATADINE 10 MG PO TABS
10.0000 mg | ORAL_TABLET | Freq: Every day | ORAL | Status: DC
  Administered 2013-08-31 – 2013-09-01 (×2): 10 mg via ORAL
  Filled 2013-08-30 (×2): qty 1

## 2013-08-30 MED ORDER — PROMETHAZINE HCL 25 MG/ML IJ SOLN: 6.2500 mg | INTRAMUSCULAR | Status: DC | PRN

## 2013-08-30 MED ORDER — ONDANSETRON HCL 4 MG/2ML IJ SOLN
INTRAMUSCULAR | Status: AC
  Filled 2013-08-30: qty 2

## 2013-08-30 MED ORDER — PANTOPRAZOLE SODIUM 40 MG PO TBEC
40.0000 mg | DELAYED_RELEASE_TABLET | Freq: Every day | ORAL | Status: DC
  Administered 2013-08-30 – 2013-09-01 (×3): 40 mg via ORAL
  Filled 2013-08-30 (×3): qty 1

## 2013-08-30 MED ORDER — DIPHENHYDRAMINE HCL 50 MG/ML IJ SOLN: 12.5000 mg | Freq: Four times a day (QID) | INTRAMUSCULAR | Status: DC | PRN

## 2013-08-30 MED ORDER — 0.9 % SODIUM CHLORIDE (POUR BTL) OPTIME
TOPICAL | Status: DC | PRN
  Administered 2013-08-30: 2000 mL

## 2013-08-30 MED ORDER — DEXTROSE 5 % IV SOLN
1.5000 g | Freq: Two times a day (BID) | INTRAVENOUS | Status: AC
  Administered 2013-08-30 – 2013-08-31 (×2): 1.5 g via INTRAVENOUS
  Filled 2013-08-30 (×3): qty 1.5

## 2013-08-30 MED ORDER — BISACODYL 5 MG PO TBEC
10.0000 mg | DELAYED_RELEASE_TABLET | Freq: Every day | ORAL | Status: DC
  Administered 2013-08-30 – 2013-09-01 (×3): 10 mg via ORAL
  Filled 2013-08-30 (×3): qty 2

## 2013-08-30 MED ORDER — HYDRALAZINE HCL 20 MG/ML IJ SOLN
10.0000 mg | INTRAMUSCULAR | Status: DC | PRN
  Administered 2013-08-30: 10 mg via INTRAVENOUS

## 2013-08-30 SURGICAL SUPPLY — 72 items
BENZOIN TINCTURE PRP APPL 2/3 (GAUZE/BANDAGES/DRESSINGS) ×4 IMPLANT
CANISTER SUCTION 2500CC (MISCELLANEOUS) ×8 IMPLANT
CATH KIT ON Q 5IN SLV (PAIN MANAGEMENT) IMPLANT
CATH THORACIC 28FR (CATHETERS) ×4 IMPLANT
CATH THORACIC 36FR (CATHETERS) IMPLANT
CATH THORACIC 36FR RT ANG (CATHETERS) IMPLANT
CLIP TI MEDIUM 6 (CLIP) ×4 IMPLANT
CONT SPEC 4OZ CLIKSEAL STRL BL (MISCELLANEOUS) ×8 IMPLANT
COVER SURGICAL LIGHT HANDLE (MISCELLANEOUS) ×4 IMPLANT
DERMABOND ADHESIVE PROPEN (GAUZE/BANDAGES/DRESSINGS) ×2
DERMABOND ADVANCED .7 DNX6 (GAUZE/BANDAGES/DRESSINGS) ×2 IMPLANT
DRAIN CHANNEL 28F RND 3/8 FF (WOUND CARE) IMPLANT
DRAIN CHANNEL 32F RND 10.7 FF (WOUND CARE) IMPLANT
DRAPE LAPAROSCOPIC ABDOMINAL (DRAPES) ×4 IMPLANT
DRAPE WARM FLUID 44X44 (DRAPE) ×4 IMPLANT
ELECT REM PT RETURN 9FT ADLT (ELECTROSURGICAL) ×4
ELECTRODE REM PT RTRN 9FT ADLT (ELECTROSURGICAL) ×2 IMPLANT
GLOVE BIO SURGEON STRL SZ 6.5 (GLOVE) ×3 IMPLANT
GLOVE BIO SURGEONS STRL SZ 6.5 (GLOVE) ×1
GLOVE SURG SIGNA 7.5 PF LTX (GLOVE) ×8 IMPLANT
GOWN PREVENTION PLUS XLARGE (GOWN DISPOSABLE) ×8 IMPLANT
GOWN STRL NON-REIN LRG LVL3 (GOWN DISPOSABLE) ×8 IMPLANT
HANDLE STAPLE ENDO GIA SHORT (STAPLE) ×4
HEMOSTAT SURGICEL 2X14 (HEMOSTASIS) IMPLANT
KIT BASIN OR (CUSTOM PROCEDURE TRAY) ×4 IMPLANT
KIT ROOM TURNOVER OR (KITS) ×4 IMPLANT
KIT SUCTION CATH 14FR (SUCTIONS) ×4 IMPLANT
NS IRRIG 1000ML POUR BTL (IV SOLUTION) ×8 IMPLANT
PACK CHEST (CUSTOM PROCEDURE TRAY) ×4 IMPLANT
PAD ARMBOARD 7.5X6 YLW CONV (MISCELLANEOUS) ×8 IMPLANT
POUCH ENDO CATCH II 15MM (MISCELLANEOUS) IMPLANT
POUCH SPECIMEN RETRIEVAL 10MM (ENDOMECHANICALS) IMPLANT
RELOAD EGIA 45 MED/THCK PURPLE (STAPLE) ×4 IMPLANT
RELOAD EGIA 60 MED/THCK PURPLE (STAPLE) ×16 IMPLANT
SEALANT PROGEL (MISCELLANEOUS) IMPLANT
SEALANT SURG COSEAL 4ML (VASCULAR PRODUCTS) IMPLANT
SEALANT SURG COSEAL 8ML (VASCULAR PRODUCTS) IMPLANT
SOLUTION ANTI FOG 6CC (MISCELLANEOUS) ×4 IMPLANT
SPECIMEN JAR MEDIUM (MISCELLANEOUS) ×4 IMPLANT
SPONGE GAUZE 4X4 12PLY (GAUZE/BANDAGES/DRESSINGS) ×4 IMPLANT
SPONGE GAUZE 4X4 12PLY STER LF (GAUZE/BANDAGES/DRESSINGS) ×4 IMPLANT
STAPLER ENDO GIA 12MM SHORT (STAPLE) ×4 IMPLANT
SUT PROLENE 4 0 RB 1 (SUTURE)
SUT PROLENE 4-0 RB1 .5 CRCL 36 (SUTURE) IMPLANT
SUT SILK  1 MH (SUTURE) ×2
SUT SILK 1 MH (SUTURE) ×2 IMPLANT
SUT SILK 2 0SH CR/8 30 (SUTURE) IMPLANT
SUT SILK 3 0SH CR/8 30 (SUTURE) IMPLANT
SUT VIC AB 0 CTX 27 (SUTURE) IMPLANT
SUT VIC AB 1 CTX 27 (SUTURE) ×4 IMPLANT
SUT VIC AB 2-0 CT1 27 (SUTURE)
SUT VIC AB 2-0 CT1 TAPERPNT 27 (SUTURE) IMPLANT
SUT VIC AB 2-0 CTX 36 (SUTURE) ×4 IMPLANT
SUT VIC AB 3-0 MH 27 (SUTURE) IMPLANT
SUT VIC AB 3-0 SH 27 (SUTURE)
SUT VIC AB 3-0 SH 27X BRD (SUTURE) IMPLANT
SUT VIC AB 3-0 X1 27 (SUTURE) ×8 IMPLANT
SUT VICRYL 0 UR6 27IN ABS (SUTURE) ×8 IMPLANT
SUT VICRYL 2 TP 1 (SUTURE) IMPLANT
SWAB COLLECTION DEVICE MRSA (MISCELLANEOUS) IMPLANT
SYSTEM SAHARA CHEST DRAIN ATS (WOUND CARE) ×4 IMPLANT
TAPE CLOTH SURG 6X10 WHT LF (GAUZE/BANDAGES/DRESSINGS) ×4 IMPLANT
TIP APPLICATOR SPRAY EXTEND 16 (VASCULAR PRODUCTS) IMPLANT
TOWEL OR 17X24 6PK STRL BLUE (TOWEL DISPOSABLE) ×4 IMPLANT
TOWEL OR 17X26 10 PK STRL BLUE (TOWEL DISPOSABLE) ×8 IMPLANT
TRAP SPECIMEN MUCOUS 40CC (MISCELLANEOUS) IMPLANT
TRAY FOLEY CATH 16FRSI W/METER (SET/KITS/TRAYS/PACK) ×4 IMPLANT
TROCAR XCEL BLADELESS 5X75MML (TROCAR) ×4 IMPLANT
TROCAR XCEL NON-BLD 5MMX100MML (ENDOMECHANICALS) IMPLANT
TUBE ANAEROBIC SPECIMEN COL (MISCELLANEOUS) IMPLANT
TUNNELER SHEATH ON-Q 11GX8 DSP (PAIN MANAGEMENT) IMPLANT
WATER STERILE IRR 1000ML POUR (IV SOLUTION) ×8 IMPLANT

## 2013-08-30 NOTE — Progress Notes (Signed)
Utilization review completed.  

## 2013-08-30 NOTE — Anesthesia Postprocedure Evaluation (Signed)
  Anesthesia Post-op Note  Patient: Andres Taylor  Procedure(s) Performed: Procedure(s): VIDEO ASSISTED THORACOSCOPY (VATS)/WEDGE RESECTION (Right)  Patient Location: PACU  Anesthesia Type:General  Level of Consciousness: awake and alert   Airway and Oxygen Therapy: Patient Spontanous Breathing  Post-op Pain: mild  Post-op Assessment: Post-op Vital signs reviewed, Patient's Cardiovascular Status Stable, Respiratory Function Stable, Patent Airway, No signs of Nausea or vomiting and Pain level controlled  Post-op Vital Signs: Reviewed and stable  Complications: No apparent anesthesia complications

## 2013-08-30 NOTE — H&P (Signed)
HPI:   [name] returns today to discuss further management of his right upper lobe mass.   [name] is a 68 year old gentleman for saw back in October regarding a right upper lobe mass. He's had this mass for at least 10 years. It had been biopsied about 10 years ago and showed benign fibrous tissue. In October he was being evaluated for shortness of breath area the chest x-ray showed the mass and a CT showed the mass had increased in size now being 2.2 x 2.6 cm. His pulmonary function testing showed some mild obstructive and restrictive changes. He had a cardiac workup including catheterization by Dr. Ellyn Hack which showed normal coronary arteries. He also had a sleep study which showed some apneic spells but not enough to warrant CPAP.   He says his shortness of breath has gotten better, but has not resolved. He's not having chest pain. It is still exertional and is worse with inclines. He says he has lost a little bit of weight intentionally. He does not have any significant cough or hemoptysis. He does have joint pain. He has not had a history of excessive bleeding. All other systems are negative.    Past Medical History   Diagnosis  Date   .  Hypertension     .  Gout     .  Arthritis     .  Sciatica     .  Pneumonitis         right lung   .  Leg edema     .  Diabetes mellitus     .  Allergic rhinitis     .  Blood in urine     .  Calculus of kidney     .  DDD (degenerative disc disease)     .  Eustachian tube dysfunction     .  Hyperlipidemia     .  Obesity     .  Spinal stenosis     .  Joint pain, knee     .  Fatty liver             Current Outpatient Prescriptions   Medication  Sig  Dispense  Refill   .  aspirin EC 81 MG tablet  Take 81 mg by mouth daily.         .  cetirizine (ZYRTEC) 10 MG tablet  Take 10 mg by mouth daily.         .  colchicine 0.6 MG tablet  Take 0.6 mg by mouth 2 (two) times daily as needed (for gout). PRN for Gout         .  diclofenac  (VOLTAREN) 75 MG EC tablet  Take 75 mg by mouth 2 (two) times daily.          .  furosemide (LASIX) 40 MG tablet  Take 40 mg by mouth daily.          [name]  gabapentin (NEURONTIN) 300 MG capsule  Take 300 mg by mouth 3 (three) times daily.          [name]  JANUMET 50-1000 MG per tablet  Take 1 tablet by mouth 2 (two) times daily with a meal.          .  lisinopril (PRINIVIL,ZESTRIL) 20 MG tablet  Take 20 mg by mouth daily.          .  metoprolol tartrate (LOPRESSOR) 25 MG tablet  Take 0.5 tablets (12.5  mg total) by mouth 2 (two) times daily.   30 tablet   6   .  mometasone (NASONEX) 50 MCG/ACT nasal spray  Place 2 sprays into the nose as needed (for allergies).              No current facility-administered medications for this visit.       Family History   Problem  Relation  Age of Onset   .  Leukemia  Father     .  Breast cancer  Maternal Grandmother     .  Hypertension  Maternal Grandfather         History       Social History   .  Marital Status:  Legally Separated       Spouse Name:  N/A       Number of Children:  2   .  Years of Education:  N/A       Occupational History   .  commerical realtor         Social History Main Topics   .  Smoking status:  Former Smoker -- 1.50 packs/day for 42 years       Types:  Cigarettes       Quit date:  07/13/2002   .  Smokeless tobacco:  Never Used   .  Alcohol Use:  Yes         Comment: beer   .  Drug Use:  No   .  Sexual Activity:  Not on file       Other Topics  Concern   .  Not on file       Social History Narrative     He is process separation process/divorce.  He has 2 adult children     He lives alone in [place].  He works in Therapist, sports.     He quit smoking in 2004 after 50+ pack year smoking history.  He occasionally drinks alcohol.        Physical Exam   Diagnostic Tests: Pulmonary function testing 03/16/2013   FVC 3.13 (75%)   FEV1 2.43 (79%)   FEV1/FVC 0.78 (104%)   SVC 3.12 (75%)   DLCO  69%   No significant change with bronchodilators   CT Chest 02/08/2013 MPRESSION: Smoothly marginated soft tissue nodule in the right upper lobe. This could represent a malignancy, but a hamartoma should be considered and with this smoothly marginated border I think this is likely. I recommend a PET scan for further evaluation of this nodule. I recommend follow-up of the 5 mm nodule at the left lung base in 6 months with an unenhanced CT scan of the PET scan does not reveal malignancy Original Report Authenticated By: Lorriane Shire, M.D.   Impression: 68 year old gentleman with a 2.2 x 2.6 cm right upper lobe mass. The mass is smoothly marginated and has been present for many years. It most likely is benign. However given that it has grown at the would be best to remove the mass at this time.   I have discussed with Mr and Mrs Gudino the general nature of the procedure, which is right VATS, wedge resection, possible right upper lobectomy. We discussed  the need for general anesthesia, and the incisions to be used. We discussed the expected hospital stay, overall recovery and short and long term outcomes. We discussed the indications, risks, benefits, and alternatives. He understands the risks include, but are not limited to death, MI, DVT/PE, bleeding,  possible need for transfusion, infections,prolonged air leaks, cardiac arrhythmias, and other organ system dysfunction including respiratory, renal, or GI complications. He accepts these risks and wishes to proceed.   He requests to have the surgery done on February 18. We will plan to repeat his chest CT prior to surgery as it has been 6 months and the radiologist recommended a repeat CT to evaluate the left lower lobe nodule at 6 months.   Plan: Right VATS, wedge resection, possible right upper lobectomy   No interval change since January      CT CHEST WITHOUT CONTRAST  TECHNIQUE:  Multidetector CT imaging of the chest was performed  following the  standard protocol without IV contrast.  COMPARISON: 02/08/2013 and 12/22/2002  FINDINGS:  The smoothly marginated 2.7 x 2.0 x 2.2 cm nodule in the posterior  aspect of the right upper lobe is unchanged since 02/08/2013 but has  increased in size from 13 mm on 12/22/2002.  No hilar or mediastinal adenopathy. Heart size is normal. 5 mm  nodule in the left lower lobe is unchanged.  No new pulmonary nodules. No new osseous abnormality. Anterior  osteophytes fuse the thoracic spine from T5 through T11. Visualized  portion of the abdomen is stable in appearance.  IMPRESSION:  No change in the smoothly marginated 2.7 cm nodule in the posterior  aspect of the right upper lobe since 02/08/2013. 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  Electronically Signed  By: Rozetta Nunnery M.D.  On: 07/26/2013 10:45

## 2013-08-30 NOTE — Anesthesia Preprocedure Evaluation (Signed)
Anesthesia Evaluation  Patient identified by MRN, date of birth, ID band Patient awake    Reviewed: Allergy & Precautions, H&P , NPO status , Patient's Chart, lab work & pertinent test results  Airway Mallampati: II TM Distance: <3 FB Neck ROM: Full    Dental   Pulmonary shortness of breath, COPDformer smoker,  Lung nodule + rhonchi         Cardiovascular hypertension, + angina Rhythm:Regular Rate:Normal     Neuro/Psych Spinal stenosis  Neuromuscular disease    GI/Hepatic   Endo/Other  diabetes  Renal/GU      Musculoskeletal   Abdominal (+) + obese,   Peds  Hematology   Anesthesia Other Findings   Reproductive/Obstetrics                           Anesthesia Physical Anesthesia Plan  ASA: III  Anesthesia Plan: General   Post-op Pain Management:    Induction: Intravenous  Airway Management Planned: Double Lumen EBT  Additional Equipment: Arterial line and CVP  Intra-op Plan:   Post-operative Plan: Extubation in OR  Informed Consent: I have reviewed the patients History and Physical, chart, labs and discussed the procedure including the risks, benefits and alternatives for the proposed anesthesia with the patient or authorized representative who has indicated his/her understanding and acceptance.     Plan Discussed with: CRNA and Surgeon  Anesthesia Plan Comments:         Anesthesia Quick Evaluation

## 2013-08-30 NOTE — Transfer of Care (Signed)
Immediate Anesthesia Transfer of Care Note  Patient: Andres Taylor  Procedure(s) Performed: Procedure(s): VIDEO ASSISTED THORACOSCOPY (VATS)/WEDGE RESECTION (Right)  Patient Location: PACU  Anesthesia Type:General  Level of Consciousness: awake, alert  and oriented  Airway & Oxygen Therapy: Patient Spontanous Breathing and Patient connected to nasal cannula oxygen  Post-op Assessment: Report given to PACU RN and Post -op Vital signs reviewed and stable  Post vital signs: Reviewed and stable  Complications: No apparent anesthesia complications

## 2013-08-30 NOTE — Interval H&P Note (Signed)
History and Physical Interval Note:  08/30/2013 8:12 AM  Andres Taylor  has presented today for surgery, with the diagnosis of RUL LUNG MASS  The various methods of treatment have been discussed with the patient and family. After consideration of risks, benefits and other options for treatment, the patient has consented to  Procedure(s): VIDEO ASSISTED THORACOSCOPY (VATS)/WEDGE RESECTION (Right) LOBECTOMY (Right) as a surgical intervention .  [name] history has been reviewed, patient examined, no change in status, stable for surgery.  I have reviewed the patient's chart and labs.  Questions were answered to the patient's satisfaction.     Amed Datta C

## 2013-08-30 NOTE — Brief Op Note (Addendum)
08/30/2013  10:18 AM  PATIENT:  Andres Taylor  68 y.o. 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 Frozen was benign, spindle cell neoplasm, and margins negative.  COUNTS CORRECT:  YES  PLAN OF CARE: Admit to inpatient   PATIENT DISPOSITION:  PACU - hemodynamically stable.   Delay start of Pharmacological VTE agent (>24hrs) due to surgical blood loss or risk of bleeding: yes

## 2013-08-30 NOTE — Anesthesia Procedure Notes (Addendum)
Anesthesia Procedure Note CVP: Timeout, sterile prep, drape, FBP R neck.  Trendelenburg position.  1% lido local, finder and trocar RIJ 1st pass with US guidance.  2 lumen placed over J wire. Biopatch and sterile dressing on.  Patient tolerated well.  VSS.  Jenita Seashore, MD  [phone] Procedure Name: Intubation Date/Time: 08/30/2013 8:28 AM Performed by: Babs Bertin Pre-anesthesia Checklist: Patient identified, Emergency Drugs available, Suction available, Patient being monitored and Timeout performed Patient Re-evaluated:Patient Re-evaluated prior to inductionOxygen Delivery Method: Circle system utilized Preoxygenation: Pre-oxygenation with 100% oxygen Intubation Type: IV induction Ventilation: Mask ventilation without difficulty Laryngoscope size: unable to view cords with MAC 3 blade. Grade View: Grade I Endobronchial tube: Left, Double lumen EBT, EBT position confirmed by auscultation and EBT position confirmed by fiberoptic bronchoscope and 39 Fr Number of attempts: 1 Airway Equipment and Method: Video-laryngoscopy and Rigid stylet (grade on view with glidescope) Placement Confirmation: ETT inserted through vocal cords under direct vision,  positive ETCO2 and breath sounds checked- equal and bilateral Tube secured with: Tape Dental Injury: Teeth and Oropharynx as per pre-operative assessment

## 2013-08-31 ENCOUNTER — Inpatient Hospital Stay (HOSPITAL_COMMUNITY): Payer: Medicare Other

## 2013-08-31 DIAGNOSIS — J9819 Other pulmonary collapse: Secondary | ICD-10-CM | POA: Diagnosis not present

## 2013-08-31 DIAGNOSIS — J9383 Other pneumothorax: Secondary | ICD-10-CM | POA: Diagnosis not present

## 2013-08-31 LAB — GLUCOSE, CAPILLARY
GLUCOSE-CAPILLARY: 109 mg/dL — AB (ref 70–99)
GLUCOSE-CAPILLARY: 113 mg/dL — AB (ref 70–99)
GLUCOSE-CAPILLARY: 145 mg/dL — AB (ref 70–99)
Glucose-Capillary: 85 mg/dL (ref 70–99)
Glucose-Capillary: 124 mg/dL — ABNORMAL HIGH (ref 70–99)

## 2013-08-31 LAB — BASIC METABOLIC PANEL
BUN: 11 mg/dL (ref 6–23)
CHLORIDE: 100 meq/L (ref 96–112)
CO2: 25 meq/L (ref 19–32)
Calcium: 8.3 mg/dL — ABNORMAL LOW (ref 8.4–10.5)
Creatinine, Ser: 0.68 mg/dL (ref 0.50–1.35)
GFR calc non Af Amer: >90 mL/min (ref >90)
Glucose, Bld: 157 mg/dL — ABNORMAL HIGH (ref 70–99)
POTASSIUM: 4.7 meq/L (ref 3.7–5.3)
Sodium: 137 mEq/L (ref 137–147)

## 2013-08-31 LAB — BLOOD GAS, ARTERIAL
ACID-BASE DEFICIT: 0.6 mmol/L (ref 0.0–2.0)
Bicarbonate: 24 mEq/L (ref 20.0–24.0)
Drawn by: 39898
O2 Content: 1 L/min
O2 Saturation: 96.6 %
PO2 ART: 82.4 mmHg (ref 80.0–100.0)
Patient temperature: 98.6
TCO2: 25.3 mmol/L (ref 0–100)
pCO2 arterial: 42.9 mmHg (ref 35.0–45.0)
pH, Arterial: 7.366 (ref 7.350–7.450)

## 2013-08-31 LAB — CBC
HCT: 39.2 % (ref 39.0–52.0)
HEMOGLOBIN: 12.9 g/dL — AB (ref 13.0–17.0)
MCH: 29.3 pg (ref 26.0–34.0)
MCHC: 32.9 g/dL (ref 30.0–36.0)
MCV: 88.9 fL (ref 78.0–100.0)
Platelets: 200 10*3/uL (ref 150–400)
RBC: 4.41 MIL/uL (ref 4.22–5.81)
RDW: 13.4 % (ref 11.5–15.5)
WBC: 9.1 10*3/uL (ref 4.0–10.5)

## 2013-08-31 MED ORDER — SODIUM CHLORIDE 0.9 % IV SOLN
INTRAVENOUS | Status: DC
  Administered 2013-08-31: 10:00:00 via INTRAVENOUS

## 2013-08-31 MED ORDER — METFORMIN HCL 500 MG PO TABS
1000.0000 mg | ORAL_TABLET | Freq: Two times a day (BID) | ORAL | Status: DC
  Administered 2013-08-31 – 2013-09-01 (×3): 1000 mg via ORAL
  Filled 2013-08-31 (×5): qty 2

## 2013-08-31 MED ORDER — ENOXAPARIN SODIUM 40 MG/0.4ML ~~LOC~~ SOLN
40.0000 mg | Freq: Every day | SUBCUTANEOUS | Status: DC
  Administered 2013-08-31 – 2013-09-01 (×2): 40 mg via SUBCUTANEOUS
  Filled 2013-08-31 (×2): qty 0.4

## 2013-08-31 MED ORDER — INSULIN ASPART 100 UNIT/ML ~~LOC~~ SOLN: 0.0000 [IU] | Freq: Three times a day (TID) | SUBCUTANEOUS | Status: DC

## 2013-08-31 MED ORDER — DICLOFENAC SODIUM 75 MG PO TBEC
75.0000 mg | DELAYED_RELEASE_TABLET | Freq: Two times a day (BID) | ORAL | Status: DC
  Administered 2013-08-31 – 2013-09-01 (×3): 75 mg via ORAL
  Filled 2013-08-31 (×4): qty 1

## 2013-08-31 MED ORDER — LINAGLIPTIN 5 MG PO TABS
5.0000 mg | ORAL_TABLET | Freq: Two times a day (BID) | ORAL | Status: DC
  Administered 2013-08-31 – 2013-09-01 (×3): 5 mg via ORAL
  Filled 2013-08-31 (×5): qty 1

## 2013-08-31 MED ORDER — GUAIFENESIN ER 600 MG PO TB12
600.0000 mg | ORAL_TABLET | Freq: Two times a day (BID) | ORAL | Status: DC | PRN
  Administered 2013-08-31: 600 mg via ORAL
  Filled 2013-08-31: qty 1

## 2013-08-31 NOTE — Discharge Instructions (Signed)
Video-Assisted Thoracic Surgery °Care After °Refer to this sheet in the next few weeks. These instructions provide you with information on caring for yourself after your procedure. Your caregiver may also give you more specific instructions. Your procedure has been planned according to current medical practices, but problems sometimes occur. Call your caregiver if you have any problems or questions after your procedure. °HOME CARE INSTRUCTIONS  °· Only take over-the-counter or prescription medications as directed. °· Only take pain medications (narcotics) as directed. °· Do not drive until your caregiver approves. Driving while taking narcotics or soon after surgery can be dangerous, so discuss the specific timing with your caregiver. °· Avoid activities that use your chest muscles, such as lifting heavy objects, for at least 3 4 weeks.   °· Take deep breaths to expand the lungs and to protect against pneumonia. °· Do breathing exercises as directed by your caregiver. If you were given an incentive spirometer to help with breathing, use it as directed. °· You may resume a normal diet and activities when you feel you are able to or as directed. °· Do not take a bath until your caregiver says it is OK. Use the shower instead.   °· Keep the bandage (dressing) covering the area where the chest tube was inserted (incision site) dry for 48 hours. After 48 hours, remove the dressing unless there is new drainage. °· Remove dressings as directed by your caregiver. °· Change dressings if necessary or as directed. °· Keep all follow-up appointments. It is important for you to see your caregiver after surgery to discuss appropriate follow-up care and surveillance, if it is necessary. °SEEK MEDICAL CARE: °· You feel excessive or increasing pain at an incision site. °· You notice bleeding, skin irritation, drainage, swelling, or redness at an incision site. °· There is a bad smell coming from an incision or dressing. °· It feels  like your heart is fluttering or beating rapidly. °· Your pain medication does not relieve your pain. °SEEK IMMEDIATE MEDICAL CARE IF:  °· You have a fever.   °· You have chest pain.  °· You have a rash. °· You have shortness of breath. °· You have trouble breathing.   °· You feel weak, lightheaded, dizzy, or faint.   °MAKE SURE YOU:  °· Understand these instructions.   °· Will watch your condition.   °· Will get help right away if you are not doing well or get worse. °Document Released: 10/24/2012 Document Reviewed: 10/24/2012 °[employer]® Patient Information ©2014 [employer]. ° °

## 2013-08-31 NOTE — Discharge Summary (Signed)
Physician Discharge Summary       [address]       [place] [address]             [phone]    Patient ID: Andres Taylor MRN: [id number] DOB/AGE: [birth year] 68 y.o.  Admit date: 08/30/2013 Discharge date: 09/01/2013  Admission Diagnoses: 1. Right upper lobe mass 2. History of hypertension 3. History of DM 4. History of spinal stenosis 5.History of hyperlipidemia  Discharge Diagnoses:  1. 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 History of hypertension 3. History of DM 4. History of spinal stenosis 5.History of hyperlipidemia   Procedure (s):  Right video-assisted thoracoscopy, right upper lobe wedge resection by Dr. Roxan Hockey on 08/31/2013.  Pathology: Results are pending  History of Presenting Illness: This is a 68 year old gentleman for saw back in October regarding a right upper lobe mass. He's had this mass for at least 10 years. It had been biopsied about 10 years ago and showed benign fibrous tissue. In October he was being evaluated for shortness of breath area the chest x-ray showed the mass and a CT showed the mass had increased in size now being 2.2 x 2.6 cm. His pulmonary function testing showed some mild obstructive and restrictive changes. He had a cardiac workup including catheterization by Dr. Ellyn Hack which showed normal coronary arteries. He also had a sleep study which showed some apneic spells but not enough to warrant CPAP.  He says his shortness of breath has gotten better, but has not resolved. He's not having chest pain. It is still exertional and is worse with inclines. He says he has lost a little bit of weight intentionally. He does not have any significant cough or hemoptysis. He does have joint pain. He has not had a history of excessive bleeding. All other systems are negative. Dr. Roxan Hockey discussed the need for a right VATS and wedge resection of the right upper lobe mass. Potential risks, complications, and  benefits were discussed and the patient agreed to proceed with surgery.   Brief Hospital Course:  He has remained afebrile and hemodynamically stable. His a line and foley were removed on post operative day one. Chest tube had minor output and no air leak. Chest x rays remained stable. Chest tube was placed to water seal and then removed. He is tolerating a diet and is passing flatus. He is ambulating on room air. He is felt surgically stable for discharge today.    Latest Vital Signs: Blood pressure 138/73, pulse 63, temperature 98.1 F (36.7 C), temperature source Oral, resp. rate 17, height 5\' 8"  (1.727 m), weight 117.482 kg (259 lb), SpO2 98.00%.  Physical Exam: Cardiovascular: RRR  Pulmonary: Clear to auscultation bilaterally; no rales, wheezes, or rhonchi.  Abdomen: Soft, non tender, bowel sounds present.  Extremities: Trace bilateral lower extremity edema.  Wounds: Clean and dry. No erythema or signs of infection.  Chest Tube: to suction and no air leak  Discharge Condition:Stable  Recent laboratory studies:  Lab Results  Component Value Date   WBC 7.5 09/01/2013   HGB 13.0 09/01/2013   HCT 38.6* 09/01/2013   MCV 89.8 09/01/2013   PLT 157 09/01/2013   Lab Results  Component Value Date   NA 138 09/01/2013   K 4.3 09/01/2013   CL 101 09/01/2013   CO2 25 09/01/2013   CREATININE 1.04 09/01/2013   GLUCOSE 114* 09/01/2013      Diagnostic Studies: Dg Chest Port 1 View  08/31/2013  CLINICAL DATA:  Chest tube.  EXAM: PORTABLE CHEST - 1 VIEW  COMPARISON:  Single view of the chest 08/30/2013.  FINDINGS: Chest tube and right IJ catheter remain in place. No pneumothorax is identified. Mild bibasilar atelectasis is noted. No pleural fluid. Heart size upper normal.  IMPRESSION: Right chest tube in place. Negative for pneumothorax or acute disease.   Electronically Signed   By: Inge Rise M.D.   On: 08/31/2013 07:37       Future Appointments Provider Department Dept Phone    09/19/2013 11:00 AM Melrose Nakayama, MD [facility] [phone]      Discharge Medications:   Medication List    STOP taking these medications       diclofenac 75 MG EC tablet  Commonly known as:  VOLTAREN      TAKE these medications       aspirin EC 81 MG tablet  Take 81 mg by mouth daily.     cetirizine 10 MG tablet  Commonly known as:  ZYRTEC  Take 10 mg by mouth daily.     colchicine 0.6 MG tablet  Take 0.6 mg by mouth 2 (two) times daily as needed (for gout). PRN for Gout     furosemide 40 MG tablet  Commonly known as:  LASIX  Take 40 mg by mouth every other day.     gabapentin 300 MG capsule  Commonly known as:  NEURONTIN  Take 300 mg by mouth 2 (two) times daily.     JANUMET 50-1000 MG per tablet  Generic drug:  sitaGLIPtin-metformin  Take 1 tablet by mouth 2 (two) times daily.     lisinopril 20 MG tablet  Commonly known as:  PRINIVIL,ZESTRIL  Take 20 mg by mouth daily.     mometasone 50 MCG/ACT nasal spray  Commonly known as:  NASONEX  Place 2 sprays into the nose daily as needed (for allergies).     oxyCODONE-acetaminophen 5-325 MG per tablet  Commonly known as:  PERCOCET/ROXICET  Take 1-2 tablets by mouth every 4 (four) hours as needed for severe pain.        Follow Up Appointments: Follow-up Information   Follow up with Melrose Nakayama, MD On 09/19/2013. (PA/LAT CXR t be taken (at [facility] which is in the same building as Dr. Leonarda Salon office) on 09/19/2013  at 10:00 am ;Appointment is at 11:00 am)    Specialty:  Cardiothoracic Surgery   Contact information:   [address] [place] [address] [phone]       Signed: Lars Pinks MPA-C 09/01/2013, 7:52 AM

## 2013-08-31 NOTE — Progress Notes (Signed)
Dc'ed patients chest tube per md order. Followed policies and procedures and patient tolerated procedure well. VSS. Will continue to monitor.

## 2013-08-31 NOTE — Progress Notes (Addendum)
      [address]       [place] [address]             [phone]       1 Day Post-Op Procedure(s) (LRB): VIDEO ASSISTED THORACOSCOPY (VATS)/WEDGE RESECTION (Right)  Subjective: Patient sitting in chair without complaints.  Objective: Vital signs in last 24 hours: Temp:  [97.5 F (36.4 C)-98.7 F (37.1 C)] 98.6 F (37 C) (02/19 0348) Pulse Rate:  [60-80] 64 (02/19 0348) Cardiac Rhythm:  [-] Normal sinus rhythm (02/19 0348) Resp:  [14-29] 17 (02/19 0348) BP: (118-155)/(63-111) 126/71 mmHg (02/19 0348) SpO2:  [91 %-100 %] 100 % (02/19 0348) Arterial Line BP: (124-154)/(58-59) 124/59 mmHg (02/19 0000) Weight:  [117.482 kg (259 lb)] 117.482 kg (259 lb) (02/18 1149)     Intake/Output from previous day: 02/18 0701 - 02/19 0700 In: 2390 [P.O.:240; I.V.:2100; IV Piggyback:50] Out: 9147 [Urine:1725; Chest Tube:94]   Physical Exam:  Cardiovascular: RRR Pulmonary: Clear to auscultation bilaterally; no rales, wheezes, or rhonchi. Abdomen: Soft, non tender, bowel sounds present. Extremities: Trace bilateral lower extremity edema. Wounds: Clean and dry.  No erythema or signs of infection. Chest Tube: to suction and no air leak  Lab Results: CBC: Recent Labs  08/28/13 1042 08/31/13 0400  WBC 5.2 9.1  HGB 14.2 12.9*  HCT 41.0 39.2  PLT 187 200   BMET:  Recent Labs  08/28/13 1042 08/31/13 0400  NA 141 137  K 4.4 4.7  CL 102 100  CO2 23 25  GLUCOSE 97 157*  BUN 15 11  CREATININE 0.84 0.68  CALCIUM 9.1 8.3*    PT/INR:  Recent Labs  08/28/13 1042  LABPROT 13.7  INR 1.07   ABG:  INR: Will add last result for INR, ABG once components are confirmed Will add last 4 CBG results once components are confirmed  Assessment/Plan:  1. CV - SR 2.  Pulmonary - Chest tube with 94 cc of output since surgery. There is no air leak. Likely place to water seal. Possibly remove later today or in am.CXR appears to show no pneumothorax, low lung volumes,  small bilateral pleural effusions and atelectasis. Encourage incentive spirometer 3. DM-CBGs126/109/145. Will restart Janumet as is tolerating a diet. 4. On Voltaren pre op (had spinal fusion) and is requesting this am so will restart 5. Remove a line  ZIMMERMAN,DONIELLE MPA-C 08/31/2013,7:38 AM  Patient seen and examined, agree with above Ct to water seal- if CXR OK at noon will dc CT later today

## 2013-08-31 NOTE — Progress Notes (Signed)
Feels well  BP 127/73  Pulse 66  Temp(Src) 99 F (37.2 C) (Oral)  Resp 18  Ht 5\' 8"  (1.727 m)  Wt 259 lb (117.482 kg)  BMI 39.39 kg/m2  SpO2 96%  No air leak, repeat CXR shows no pneumo  Dc CT

## 2013-08-31 NOTE — Op Note (Signed)
NAMEWALEED, DETTMAN NO.:  [id number]  MEDICAL RECORD NO.:  [id number]  LOCATION:  3S05C                        FACILITY:  [facility]  PHYSICIAN:  Revonda Standard. Roxan Hockey, M.D.DATE OF BIRTH:  [birth year]  DATE OF PROCEDURE:  08/30/2013 DATE OF DISCHARGE:                              OPERATIVE REPORT   PREOPERATIVE DIAGNOSIS:  Right upper lobe mass.  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  PROCEDURE:  Right video-assisted thoracoscopy, right upper lobe wedge resection.  SURGEON:  Revonda Standard. Roxan Hockey, MD  ASSISTANT:  Lars Pinks, PA  ANESTHESIA:  General.  FINDINGS:  A 2.5-cm mass posterior aspect of the right upper lobe, firm and smooth to palpation.  Frozen section revealed spindle cell neoplasm, benign, margins clear.  CLINICAL NOTE:  Mr. Kopf is a 68 year old gentleman, who had been followed for a right upper lobe mass and had been biopsied 10 years Previously, which showed benign fibrous tissue.  He had been having shortness of breath, and had a chest x-ray and CT which showed the mass had increased in size, although this was still felt to be benign.  Given the size increase, it was recommended that he have it resected.  He was informed of the indications, risks, benefits, and alternatives.  He understood and accepted the risks of surgery and wished to proceed.  OPERATIVE NOTE:  Mr. Fitzwater was brought to the preoperative holding area on August 30, 2013.  There Anesthesia placed a central line and an arterial blood pressure monitoring line.  Intravenous antibiotics were administered.  He was taken to the operating room, anesthetized, and intubated with a double-lumen endotracheal tube.  A Foley catheter was placed.  Sequential compression devices were placed on the legs for DVT prophylaxis.  He was placed in a left lateral decubitus position, and the right chest was prepped and draped in the usual sterile  fashion. Single lung ventilation of the left lung was initiated and was tolerated well throughout the procedure.  An incision was made in approximately at the seventh intercostal space and was carried through the skin and subcutaneous tissue.  The chest was entered bluntly using a 5-mm port.  The 5-mm thoracoscope was placed into the chest.  There was good isolation of the right lung.  A small utility incision was made in approximately the fourth interspace anterolaterally, no rib spreading was performed during the procedure.  The upper lobe was grasped.  The mass was palpable in the posterior aspect of the upper lobe, it was 2.5 cm in diameter and was smooth.  There was no contraction of the overlying lung or visceral pleura.  A wedge resection was performed with sequential firings of an endoscopic GIA stapler using purple cartridges.  The specimen then was placed into an endoscopic retrieval bag and removed through the utility incision.  It was sent for frozen section.  The staple line was inspected, there was good hemostasis.  The frozen section subsequently returned with a benign spindle cell neoplasm.  Margins were clear.  There was no indication for further resection.  The chest was copiously irrigated with warm saline.  A test inflation showed minimal air leak from the  staple line.  A 28-French chest tube was placed through the original port incision and secured with a #1 silk suture.  The small utility incision was closed with a running #1 Vicryl fascial suture followed by a 2-0 Vicryl subcutaneous suture and a 3-0 Vicryl subcuticular suture.  All sponge, needle, and instrument counts were correct at the end of the procedure.  The patient was taken from the operating room to the postanesthetic care unit extubated and in good condition.     Revonda Standard Roxan Hockey, M.D.     SCH/MEDQ  D:  08/30/2013  T:  08/31/2013  Job:  [id number]

## 2013-09-01 ENCOUNTER — Encounter (HOSPITAL_COMMUNITY): Payer: Self-pay | Admitting: Thoracic Surgery (Cardiothoracic Vascular Surgery)

## 2013-09-01 ENCOUNTER — Inpatient Hospital Stay (HOSPITAL_COMMUNITY): Payer: Medicare Other

## 2013-09-01 DIAGNOSIS — J811 Chronic pulmonary edema: Secondary | ICD-10-CM | POA: Diagnosis not present

## 2013-09-01 DIAGNOSIS — J90 Pleural effusion, not elsewhere classified: Secondary | ICD-10-CM | POA: Diagnosis not present

## 2013-09-01 LAB — GLUCOSE, CAPILLARY
GLUCOSE-CAPILLARY: 90 mg/dL (ref 70–99)
Glucose-Capillary: 94 mg/dL (ref 70–99)

## 2013-09-01 LAB — CBC
HEMATOCRIT: 38.6 % — AB (ref 39.0–52.0)
Hemoglobin: 13 g/dL (ref 13.0–17.0)
MCH: 30.2 pg (ref 26.0–34.0)
MCHC: 33.7 g/dL (ref 30.0–36.0)
MCV: 89.8 fL (ref 78.0–100.0)
Platelets: 157 10*3/uL (ref 150–400)
RBC: 4.3 MIL/uL (ref 4.22–5.81)
RDW: 13.5 % (ref 11.5–15.5)
WBC: 7.5 10*3/uL (ref 4.0–10.5)

## 2013-09-01 LAB — COMPREHENSIVE METABOLIC PANEL
ALK PHOS: 52 U/L (ref 39–117)
ALT: 21 U/L (ref 0–53)
AST: 14 U/L (ref 0–37)
Albumin: 3.3 g/dL — ABNORMAL LOW (ref 3.5–5.2)
BILIRUBIN TOTAL: 0.6 mg/dL (ref 0.3–1.2)
BUN: 15 mg/dL (ref 6–23)
CHLORIDE: 101 meq/L (ref 96–112)
CO2: 25 meq/L (ref 19–32)
Calcium: 8.4 mg/dL (ref 8.4–10.5)
Creatinine, Ser: 1.04 mg/dL (ref 0.50–1.35)
GFR calc Af Amer: 83 mL/min — ABNORMAL LOW (ref >90)
GFR calc non Af Amer: 71 mL/min — ABNORMAL LOW (ref >90)
Glucose, Bld: 114 mg/dL — ABNORMAL HIGH (ref 70–99)
POTASSIUM: 4.3 meq/L (ref 3.7–5.3)
Sodium: 138 mEq/L (ref 137–147)
Total Protein: 6.6 g/dL (ref 6.0–8.3)

## 2013-09-01 MED ORDER — OXYCODONE-ACETAMINOPHEN 5-325 MG PO TABS: 1.0000 | ORAL_TABLET | ORAL | Status: DC | PRN

## 2013-09-01 MED ORDER — FUROSEMIDE 40 MG PO TABS
40.0000 mg | ORAL_TABLET | ORAL | Status: DC
  Administered 2013-09-01: 40 mg via ORAL
  Filled 2013-09-01: qty 1

## 2013-09-01 MED ORDER — COLCHICINE 0.6 MG PO TABS
0.6000 mg | ORAL_TABLET | Freq: Two times a day (BID) | ORAL | Status: DC | PRN
  Filled 2013-09-01: qty 1

## 2013-09-01 NOTE — Progress Notes (Addendum)
Discharge instructions and prescriptions  given to patient along with teach-back.  All questions answered.  No complaints.  Discharged home with son.

## 2013-09-01 NOTE — Progress Notes (Signed)
2 Days Post-Op Procedure(s) (LRB): VIDEO ASSISTED THORACOSCOPY (VATS)/WEDGE RESECTION (Right) Subjective: Wants to go home Minimal pain   Objective: Vital signs in last 24 hours: Temp:  [97.5 F (36.4 C)-99.3 F (37.4 C)] 98.1 F (36.7 C) (02/20 0400) Pulse Rate:  [59-87] 63 (02/20 0400) Cardiac Rhythm:  [-] Normal sinus rhythm (02/20 0400) Resp:  [10-26] 17 (02/20 0748) BP: (123-147)/(59-81) 138/73 mmHg (02/20 0400) SpO2:  [90 %-98 %] 98 % (02/20 0748) Arterial Line BP: (97)/(91) 97/91 mmHg (02/19 0755)  Hemodynamic parameters for last 24 hours:    Intake/Output from previous day: 02/19 0701 - 02/20 0700 In: 1560 [P.O.:960; I.V.:600] Out: 1159 [Urine:1100; Chest Tube:59] Intake/Output this shift:    General appearance: alert and no distress Neurologic: intact Heart: regular rate and rhythm Lungs: diminished breath sounds bibasilar  Lab Results:  Recent Labs  08/31/13 0400 09/01/13 0540  WBC 9.1 7.5  HGB 12.9* 13.0  HCT 39.2 38.6*  PLT 200 157   BMET:  Recent Labs  08/31/13 0400 09/01/13 0540  NA 137 138  K 4.7 4.3  CL 100 101  CO2 25 25  GLUCOSE 157* 114*  BUN 11 15  CREATININE 0.68 1.04  CALCIUM 8.3* 8.4    PT/INR: No results found for this basename: LABPROT, INR,  in the last 72 hours ABG    Component Value Date/Time   PHART 7.366 08/31/2013 0500   HCO3 24.0 08/31/2013 0500   TCO2 25.3 08/31/2013 0500   ACIDBASEDEF 0.6 08/31/2013 0500   O2SAT 96.6 08/31/2013 0500   CBG (last 3)   Recent Labs  08/31/13 1212 08/31/13 1643 08/31/13 2135  GLUCAP 85 113* 124*    Assessment/Plan: S/P Procedure(s) (LRB): VIDEO ASSISTED THORACOSCOPY (VATS)/WEDGE RESECTION (Right) POD # 2 Doing well Will transition to PO pain meds Ambulate Possibly home later today Path still pending   LOS: 2 days    Uday Jantz C 09/01/2013

## 2013-09-13 ENCOUNTER — Other Ambulatory Visit: Payer: Self-pay | Admitting: *Deleted

## 2013-09-13 DIAGNOSIS — R918 Other nonspecific abnormal finding of lung field: Secondary | ICD-10-CM

## 2013-09-19 ENCOUNTER — Ambulatory Visit
Admission: RE | Admit: 2013-09-19 | Discharge: 2013-09-19 | Disposition: A | Discharge status: Home / Self Care | Payer: Medicare Other | Source: Ambulatory Visit | Attending: Thoracic Surgery (Cardiothoracic Vascular Surgery) | Admitting: Thoracic Surgery (Cardiothoracic Vascular Surgery)

## 2013-09-19 ENCOUNTER — Encounter: Payer: Self-pay | Admitting: Thoracic Surgery (Cardiothoracic Vascular Surgery)

## 2013-09-19 ENCOUNTER — Ambulatory Visit (INDEPENDENT_AMBULATORY_CARE_PROVIDER_SITE_OTHER): Payer: Self-pay | Admitting: Thoracic Surgery (Cardiothoracic Vascular Surgery)

## 2013-09-19 VITALS — BP 151/98 | HR 74 | Resp 16 | Ht 68.0 in | Wt 259.0 lb

## 2013-09-19 DIAGNOSIS — D1430 Benign neoplasm of unspecified bronchus and lung: Secondary | ICD-10-CM

## 2013-09-19 DIAGNOSIS — R9389 Abnormal findings on diagnostic imaging of other specified body structures: Secondary | ICD-10-CM | POA: Diagnosis not present

## 2013-09-19 DIAGNOSIS — R918 Other nonspecific abnormal finding of lung field: Secondary | ICD-10-CM

## 2013-09-19 DIAGNOSIS — Z09 Encounter for follow-up examination after completed treatment for conditions other than malignant neoplasm: Secondary | ICD-10-CM

## 2013-09-19 NOTE — Progress Notes (Signed)
HPI:  Andres Taylor is a 68 year old gentleman who underwent a wedge resection of a right upper lobe mass on February 18. 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 He did amazingly well postoperatively and was discharged home on postoperative day #2. He says that he's not take any pain pills since he left the hospital. He's been driving for the past 2 weeks without any difficulty.  He does still get short of breath with exertion, that condition predated his surgery.  Past Medical History  Diagnosis Date  . Hypertension   . Gout   . Arthritis   . Sciatica   . Pneumonitis     right lung  . Leg edema   . Diabetes mellitus   . Allergic rhinitis   . Blood in urine   . DDD (degenerative disc disease)   . Eustachian tube dysfunction   . Hyperlipidemia   . Obesity   . Spinal stenosis   . Joint pain, knee   . Fatty liver   . Shortness of breath     with minimal exertion  . Calculus of kidney       Current Outpatient Prescriptions  Medication Sig Dispense Refill  . aspirin EC 81 MG tablet Take 81 mg by mouth daily.      . cetirizine (ZYRTEC) 10 MG tablet Take 10 mg by mouth daily.      . colchicine 0.6 MG tablet Take 0.6 mg by mouth 2 (two) times daily as needed (for gout). PRN for Gout      . diclofenac (VOLTAREN) 50 MG EC tablet       . furosemide (LASIX) 40 MG tablet Take 40 mg by mouth every other day.       . gabapentin (NEURONTIN) 300 MG capsule Take 300 mg by mouth 2 (two) times daily.       [name] JANUMET 50-1000 MG per tablet Take 1 tablet by mouth 2 (two) times daily.       [name] lisinopril (PRINIVIL,ZESTRIL) 20 MG tablet Take 20 mg by mouth daily.       . mometasone (NASONEX) 50 MCG/ACT nasal spray Place 2 sprays into the nose daily as needed (for allergies).        No current facility-administered medications for this visit.    Physical Exam BP 151/98  Pulse 74  Resp 16  Ht 5\' 8"  (1.727 m)  Wt 259 lb (117.482 kg)  BMI 39.39 kg/m2  SpO76 44% 68 year old male  in no acute distress Cardiac regular rate and rhythm normal S1-S2 Lungs clear with equal breath sounds bilaterally Incisions well healed  Diagnostic Tests: CHEST 2 VIEW  COMPARISON: DG CHEST 1V PORT dated 09/01/2013  FINDINGS:  The right lung demonstrates mild volume loss but it is better  inflated than on the earlier study. There is no focal infiltrate.  The left lung is well expanded. There is a trace of blunting of the  left lateral costophrenic angle but the posterior costophrenic angle  is clear. The cardiac silhouette is top-normal in size. There is  tortuosity of the descending thoracic aorta. The trachea is midline.  IMPRESSION:  1. There is mild volume loss on the right consistent with previous  wedge resection but there is no evidence of pneumonia or new or  pneumothorax or pleural effusion. The left lung exhibits no acute  abnormality. No pulmonary parenchymal masses or nodules are  demonstrated.  2. There is no evidence of CHF.  Electronically Signed  By:  David Martinique  On: 09/19/2013 11:10   Impression: 68 year old who is now about 3 weeks post wedge resection for a benign lung mass. 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 Margins were clear. He is doing extremely well postoperatively. He has minimal discomfort and has not had to take any narcotics since discharge the hospital. He has been driving.  I encouraged him to continue with his weight loss and physical training as I think that we'll help his shortness of breath more than anything at this point. There are no restrictions on his activities  Plan: I will be happy to see Andres Taylor back any time the future if I can be of any further assistance with his care.

## 2013-09-26 DIAGNOSIS — M545 Low back pain, unspecified: Secondary | ICD-10-CM | POA: Diagnosis not present

## 2013-09-27 DIAGNOSIS — M545 Low back pain, unspecified: Secondary | ICD-10-CM | POA: Diagnosis not present

## 2013-10-03 DIAGNOSIS — M545 Low back pain, unspecified: Secondary | ICD-10-CM | POA: Diagnosis not present

## 2013-10-25 DIAGNOSIS — H43399 Other vitreous opacities, unspecified eye: Secondary | ICD-10-CM | POA: Diagnosis not present

## 2013-10-25 DIAGNOSIS — H524 Presbyopia: Secondary | ICD-10-CM | POA: Diagnosis not present

## 2013-10-25 DIAGNOSIS — E119 Type 2 diabetes mellitus without complications: Secondary | ICD-10-CM | POA: Diagnosis not present

## 2013-10-25 DIAGNOSIS — H52229 Regular astigmatism, unspecified eye: Secondary | ICD-10-CM | POA: Diagnosis not present

## 2013-10-25 DIAGNOSIS — H2510 Age-related nuclear cataract, unspecified eye: Secondary | ICD-10-CM | POA: Diagnosis not present

## 2013-10-25 DIAGNOSIS — H5200 Hypermetropia, unspecified eye: Secondary | ICD-10-CM | POA: Diagnosis not present

## 2013-10-31 DIAGNOSIS — M25579 Pain in unspecified ankle and joints of unspecified foot: Secondary | ICD-10-CM | POA: Diagnosis not present

## 2013-10-31 DIAGNOSIS — R609 Edema, unspecified: Secondary | ICD-10-CM | POA: Diagnosis not present

## 2013-10-31 DIAGNOSIS — M47817 Spondylosis without myelopathy or radiculopathy, lumbosacral region: Secondary | ICD-10-CM | POA: Diagnosis not present

## 2013-10-31 DIAGNOSIS — G609 Hereditary and idiopathic neuropathy, unspecified: Secondary | ICD-10-CM | POA: Diagnosis not present

## 2013-10-31 DIAGNOSIS — M5430 Sciatica, unspecified side: Secondary | ICD-10-CM | POA: Diagnosis not present

## 2013-10-31 DIAGNOSIS — E119 Type 2 diabetes mellitus without complications: Secondary | ICD-10-CM | POA: Diagnosis not present

## 2013-10-31 DIAGNOSIS — J984 Other disorders of lung: Secondary | ICD-10-CM | POA: Diagnosis not present

## 2013-10-31 DIAGNOSIS — I10 Essential (primary) hypertension: Secondary | ICD-10-CM | POA: Diagnosis not present

## 2013-12-03 ENCOUNTER — Emergency Department (HOSPITAL_COMMUNITY): Payer: Medicare Other

## 2013-12-03 ENCOUNTER — Emergency Department (HOSPITAL_COMMUNITY)
Admission: EM | Admit: 2013-12-03 | Discharge: 2013-12-04 | Disposition: A | Discharge status: Home / Self Care | Payer: Medicare Other | Attending: Emergency Medicine | Admitting: Emergency Medicine

## 2013-12-03 ENCOUNTER — Encounter (HOSPITAL_COMMUNITY): Payer: Self-pay | Admitting: Emergency Medicine

## 2013-12-03 DIAGNOSIS — IMO0002 Reserved for concepts with insufficient information to code with codable children: Secondary | ICD-10-CM | POA: Diagnosis not present

## 2013-12-03 DIAGNOSIS — E669 Obesity, unspecified: Secondary | ICD-10-CM | POA: Insufficient documentation

## 2013-12-03 DIAGNOSIS — N419 Inflammatory disease of prostate, unspecified: Secondary | ICD-10-CM | POA: Insufficient documentation

## 2013-12-03 DIAGNOSIS — N410 Acute prostatitis: Secondary | ICD-10-CM | POA: Diagnosis not present

## 2013-12-03 DIAGNOSIS — M109 Gout, unspecified: Secondary | ICD-10-CM | POA: Insufficient documentation

## 2013-12-03 DIAGNOSIS — I10 Essential (primary) hypertension: Secondary | ICD-10-CM | POA: Insufficient documentation

## 2013-12-03 DIAGNOSIS — Z8739 Personal history of other diseases of the musculoskeletal system and connective tissue: Secondary | ICD-10-CM | POA: Insufficient documentation

## 2013-12-03 DIAGNOSIS — Z87891 Personal history of nicotine dependence: Secondary | ICD-10-CM | POA: Diagnosis not present

## 2013-12-03 DIAGNOSIS — E119 Type 2 diabetes mellitus without complications: Secondary | ICD-10-CM | POA: Diagnosis not present

## 2013-12-03 DIAGNOSIS — R42 Dizziness and giddiness: Secondary | ICD-10-CM | POA: Diagnosis not present

## 2013-12-03 DIAGNOSIS — Z9889 Other specified postprocedural states: Secondary | ICD-10-CM | POA: Insufficient documentation

## 2013-12-03 DIAGNOSIS — Z7982 Long term (current) use of aspirin: Secondary | ICD-10-CM | POA: Insufficient documentation

## 2013-12-03 DIAGNOSIS — Z79899 Other long term (current) drug therapy: Secondary | ICD-10-CM | POA: Insufficient documentation

## 2013-12-03 DIAGNOSIS — R0602 Shortness of breath: Secondary | ICD-10-CM | POA: Diagnosis not present

## 2013-12-03 DIAGNOSIS — E785 Hyperlipidemia, unspecified: Secondary | ICD-10-CM | POA: Insufficient documentation

## 2013-12-03 DIAGNOSIS — R509 Fever, unspecified: Secondary | ICD-10-CM | POA: Diagnosis not present

## 2013-12-03 LAB — URINALYSIS, ROUTINE W REFLEX MICROSCOPIC
BILIRUBIN URINE: NEGATIVE
GLUCOSE, UA: NEGATIVE mg/dL
Ketones, ur: 15 mg/dL — AB
Nitrite: NEGATIVE
PH: 5.5 (ref 5.0–8.0)
Protein, ur: NEGATIVE mg/dL
Specific Gravity, Urine: 1.014 (ref 1.005–1.030)
Urobilinogen, UA: 0.2 mg/dL (ref 0.0–1.0)

## 2013-12-03 LAB — CBC WITH DIFFERENTIAL/PLATELET
BASOS PCT: 0 % (ref 0–1)
Basophils Absolute: 0 10*3/uL (ref 0.0–0.1)
Eosinophils Absolute: 0 10*3/uL (ref 0.0–0.7)
Eosinophils Relative: 0 % (ref 0–5)
HCT: 39.5 % (ref 39.0–52.0)
Hemoglobin: 13.4 g/dL (ref 13.0–17.0)
Lymphocytes Relative: 5 % — ABNORMAL LOW (ref 12–46)
Lymphs Abs: 0.6 10*3/uL — ABNORMAL LOW (ref 0.7–4.0)
MCH: 29.3 pg (ref 26.0–34.0)
MCHC: 33.9 g/dL (ref 30.0–36.0)
MCV: 86.2 fL (ref 78.0–100.0)
Monocytes Absolute: 0.8 10*3/uL (ref 0.1–1.0)
Monocytes Relative: 7 % (ref 3–12)
NEUTROS PCT: 88 % — AB (ref 43–77)
Neutro Abs: 10.6 10*3/uL — ABNORMAL HIGH (ref 1.7–7.7)
PLATELETS: 150 10*3/uL (ref 150–400)
RBC: 4.58 MIL/uL (ref 4.22–5.81)
RDW: 14.1 % (ref 11.5–15.5)
WBC: 12.1 10*3/uL — ABNORMAL HIGH (ref 4.0–10.5)

## 2013-12-03 LAB — COMPREHENSIVE METABOLIC PANEL
ALBUMIN: 4 g/dL (ref 3.5–5.2)
ALK PHOS: 56 U/L (ref 39–117)
ALT: 29 U/L (ref 0–53)
AST: 26 U/L (ref 0–37)
BUN: 16 mg/dL (ref 6–23)
CO2: 23 mEq/L (ref 19–32)
Calcium: 9.2 mg/dL (ref 8.4–10.5)
Chloride: 101 mEq/L (ref 96–112)
Creatinine, Ser: 0.8 mg/dL (ref 0.50–1.35)
GFR calc Af Amer: >90 mL/min (ref >90)
GFR calc non Af Amer: 89 mL/min — ABNORMAL LOW (ref >90)
Glucose, Bld: 127 mg/dL — ABNORMAL HIGH (ref 70–99)
Potassium: 4.2 mEq/L (ref 3.7–5.3)
Sodium: 138 mEq/L (ref 137–147)
TOTAL PROTEIN: 7 g/dL (ref 6.0–8.3)
Total Bilirubin: 0.7 mg/dL (ref 0.3–1.2)

## 2013-12-03 LAB — CBG MONITORING, ED: Glucose-Capillary: 115 mg/dL — ABNORMAL HIGH (ref 70–99)

## 2013-12-03 LAB — URINE MICROSCOPIC-ADD ON

## 2013-12-03 MED ORDER — CIPROFLOXACIN HCL 500 MG PO TABS: 500.0000 mg | ORAL_TABLET | Freq: Two times a day (BID) | ORAL | Status: DC

## 2013-12-03 NOTE — ED Notes (Signed)
C/o sudden onset of chills, fever, sob, body aches, low back pain, increased urination, and dizziness. Onset 1400. Tylenol (2 caplets) taken at 1800. Alert, NAD, calm, interactive, resps e/u, speaking in clear complete sentences.

## 2013-12-03 NOTE — Discharge Instructions (Signed)
Prostatitis The prostate gland is about the size and shape of a walnut. It is located just below your bladder. It produces one of the components of semen, which is made up of sperm and the fluids that help nourish and transport it out from the testicles. Prostatitis is inflammation of the prostate gland.  There are four types of prostatitis:  Acute bacterial prostatitis This is the least common type of prostatitis. It starts quickly and usually is associated with a bladder infection, high fever, and shaking chills. It can occur at any age.  Chronic bacterial prostatitis This is a persistent bacterial infection in the prostate. It usually develops from repeated acute bacterial prostatitis or acute bacterial prostatitis that was not properly treated. It can occur in men of any age but is most common in middle-aged men whose prostate has begun to enlarge. The symptoms are not as severe as those in acute bacterial prostatitis. Discomfort in the part of your body that is in front of your rectum and below your scrotum (perineum), lower abdomen, or in the head of your penis (glans) may represent your primary discomfort.  Chronic prostatitis (nonbacterial) This is the most common type of prostatitis. It is inflammation of the prostate gland that is not caused by a bacterial infection. The cause is unknown and may be associated with a viral infection or autoimmune disorder.  Prostatodynia (pelvic floor disorder) This is associated with increased muscular tone in the pelvis surrounding the prostate. CAUSES The causes of bacterial prostatitis are bacterial infection. The causes of the other types of prostatitis are unknown.  SYMPTOMS  Symptoms can vary depending upon the type of prostatitis that exists. There can also be overlap in symptoms. Possible symptoms for each type of prostatitis are listed below. Acute Bacterial Prostatitis  Painful urination.  Fever or chills.  Muscle or joint pains.  Low  back pain.  Low abdominal pain.  Inability to empty bladder completely. Chronic Bacterial Prostatitis, Chronic Nonbacterial Prostatitis, and Prostatodynia  Sudden urge to urinate.  Frequent urination.  Difficulty starting urine stream.  Weak urine stream.  Discharge from the urethra.  Dribbling after urination.  Rectal pain.  Pain in the testicles, penis, or tip of the penis.  Pain in the perineum.  Problems with sexual function.  Painful ejaculation.  Bloody semen. DIAGNOSIS  In order to diagnose prostatitis, your health care provider will ask about your symptoms. One or more urine samples will be taken and tested (urinalysis). If the urinalysis result is negative for bacteria, your health care provider may use a finger to feel your prostate (digital rectal exam). This exam helps your health care provider determine if your prostate is swollen and tender. It will also produce a specimen of semen that can be analyzed. TREATMENT  Treatment for prostatitis depends on the cause. If a bacterial infection is the cause, it can be treated with antibiotic medicine. In cases of chronic bacterial prostatitis, the use of antibiotics for up to 1 month or 6 weeks may be necessary. Your health care provider may instruct you to take sitz baths to help relieve pain. A sitz bath is a bath of hot water in which your hips and buttocks are under water. This relaxes the pelvic floor muscles and often helps to relieve the pressure on your prostate. HOME CARE INSTRUCTIONS   Take all medicines as directed by your health care provider.  Take sitz baths as directed by your health care provider. SEEK MEDICAL CARE IF:   Your symptoms  get worse, not better.  You have a fever. SEEK IMMEDIATE MEDICAL CARE IF:   You have chills.  You feel nauseous or vomit.  You feel lightheaded or faint.  You are unable to urinate.  You have blood or blood clots in your urine. Document Released: 06/26/2000  Document Revised: 04/19/2013 Document Reviewed: 01/16/2013 [facility] Patient Information 2014 [employer].  Urinary Tract Infection Urinary tract infections (UTIs) can develop anywhere along your urinary tract. Your urinary tract is your body's drainage system for removing wastes and extra water. Your urinary tract includes two kidneys, two ureters, a bladder, and a urethra. Your kidneys are a pair of bean-shaped organs. Each kidney is about the size of your fist. They are located below your ribs, one on each side of your spine. CAUSES Infections are caused by microbes, which are microscopic organisms, including fungi, viruses, and bacteria. These organisms are so small that they can only be seen through a microscope. Bacteria are the microbes that most commonly cause UTIs. SYMPTOMS  Symptoms of UTIs may vary by age and gender of the patient and by the location of the infection. Symptoms in young women typically include a frequent and intense urge to urinate and a painful, burning feeling in the bladder or urethra during urination. Older women and men are more likely to be tired, shaky, and weak and have muscle aches and abdominal pain. A fever may mean the infection is in your kidneys. Other symptoms of a kidney infection include pain in your back or sides below the ribs, nausea, and vomiting. DIAGNOSIS To diagnose a UTI, your caregiver will ask you about your symptoms. Your caregiver also will ask to provide a urine sample. The urine sample will be tested for bacteria and white blood cells. White blood cells are made by your body to help fight infection. TREATMENT  Typically, UTIs can be treated with medication. Because most UTIs are caused by a bacterial infection, they usually can be treated with the use of antibiotics. The choice of antibiotic and length of treatment depend on your symptoms and the type of bacteria causing your infection. HOME CARE INSTRUCTIONS  If you were prescribed  antibiotics, take them exactly as your caregiver instructs you. Finish the medication even if you feel better after you have only taken some of the medication.  Drink enough water and fluids to keep your urine clear or pale yellow.  Avoid caffeine, tea, and carbonated beverages. They tend to irritate your bladder.  Empty your bladder often. Avoid holding urine for long periods of time.  Empty your bladder before and after sexual intercourse.  After a bowel movement, women should cleanse from front to back. Use each tissue only once. SEEK MEDICAL CARE IF:   You have back pain.  You develop a fever.  Your symptoms do not begin to resolve within 3 days. SEEK IMMEDIATE MEDICAL CARE IF:   You have severe back pain or lower abdominal pain.  You develop chills.  You have nausea or vomiting.  You have continued burning or discomfort with urination. MAKE SURE YOU:   Understand these instructions.  Will watch your condition.  Will get help right away if you are not doing well or get worse. Document Released: 04/08/2005 Document Revised: 12/29/2011 Document Reviewed: 08/07/2011 [facility] Patient Information 2014 [place].

## 2013-12-04 MED ORDER — ONDANSETRON 4 MG PO TBDP: 4.0000 mg | ORAL_TABLET | Freq: Three times a day (TID) | ORAL | Status: DC | PRN

## 2013-12-04 NOTE — ED Notes (Signed)
Patient has ride home with wife.

## 2013-12-04 NOTE — ED Provider Notes (Signed)
CSN: [id number]     Arrival date & time 12/03/13  2003 History   First MD Initiated Contact with Patient 12/03/13 2037     Chief Complaint  Patient presents with  . Fever  . Dizziness  . Shortness of Breath     (Consider location/radiation/quality/duration/timing/severity/associated sxs/prior Treatment) Patient is a 68 y.o. male presenting with fever, dizziness, and shortness of breath. The history is provided by the patient.  Fever Associated symptoms: chills and dysuria   Associated symptoms: no chest pain, no diarrhea, no headaches, no nausea, no rash and no vomiting   Dizziness Associated symptoms: shortness of breath   Associated symptoms: no chest pain, no diarrhea, no headaches, no nausea and no vomiting   Shortness of Breath Associated symptoms: fever   Associated symptoms: no abdominal pain, no chest pain, no headaches, no rash and no vomiting    patient's had some fevers up to 101 at home. He has felt somewhat short of breath without cough. He's had some urinary frequency for the last 2 days. He also set some mild right lower back pain. No nausea vomiting. No diarrhea. No sick contacts. He states he has felt cold for most of the day  Past Medical History  Diagnosis Date  . Hypertension   . Gout   . Arthritis   . Sciatica   . Pneumonitis     right lung  . Leg edema   . Diabetes mellitus   . Allergic rhinitis   . Blood in urine   . DDD (degenerative disc disease)   . Eustachian tube dysfunction   . Hyperlipidemia   . Obesity   . Spinal stenosis   . Joint pain, knee   . Fatty liver   . Shortness of breath     with minimal exertion  . Calculus of kidney    Past Surgical History  Procedure Laterality Date  . Lumbar disectomy l4-5  1980  . Hemorrhoid surgery  2001  . Lithotripsy      x 2  . Transthoracic echocardiogram  02/2013    EF 65-70%, moderate LVH (basal septal hypertrophy); normal wall motion; mild LA Dilation; PA pressures 47 mmHg  . Cardiac  catheterization  04/20/13    no PCI  . Back surgery    . Appendectomy  1976  . Hernia repair      as a baby 41 months old  . Video assisted thoracoscopy (vats)/wedge resection Right 08/30/2013    Procedure: VIDEO ASSISTED THORACOSCOPY (VATS)/WEDGE RESECTION;  Surgeon: Melrose Nakayama, MD;  Location: [place];  Service: Thoracic;  Laterality: Right;   Family History  Problem Relation Age of Onset  . Leukemia Father   . Breast cancer Maternal Grandmother   . Hypertension Maternal Grandfather    History  Substance Use Topics  . Smoking status: Former Smoker -- 1.50 packs/day for 42 years    Types: Cigarettes    Quit date: 07/13/2002  . Smokeless tobacco: Never Used  . Alcohol Use: 1.8 oz/week    3 Cans of beer per week     Comment: 3 tmes weekly    Review of Systems  Constitutional: Positive for fever and chills. Negative for activity change and appetite change.  Eyes: Negative for pain.  Respiratory: Positive for shortness of breath. Negative for chest tightness.   Cardiovascular: Negative for chest pain and leg swelling.  Gastrointestinal: Negative for nausea, vomiting, abdominal pain and diarrhea.  Genitourinary: Positive for dysuria. Negative for flank pain.  Musculoskeletal: Positive  for back pain. Negative for neck stiffness.  Skin: Negative for rash.  Neurological: Positive for dizziness. Negative for weakness, numbness and headaches.  Psychiatric/Behavioral: Negative for behavioral problems.      Allergies  Review of patient's allergies indicates no known allergies.  Home Medications   Prior to Admission medications   Medication Sig Start Date End Date Taking? Authorizing Provider  aspirin EC 81 MG tablet Take 81 mg by mouth daily.   Yes Historical Provider, MD  cetirizine (ZYRTEC) 10 MG tablet Take 10 mg by mouth daily.   Yes Historical Provider, MD  colchicine 0.6 MG tablet Take 0.6 mg by mouth 2 (two) times daily as needed (for gout). PRN for Gout   Yes  Historical Provider, MD  diclofenac (VOLTAREN) 50 MG EC tablet Take 50 mg by mouth 2 (two) times daily.  09/11/13  Yes Historical Provider, MD  furosemide (LASIX) 40 MG tablet Take 40 mg by mouth as needed for fluid.  02/09/13  Yes Historical Provider, MD  gabapentin (NEURONTIN) 300 MG capsule Take 300 mg by mouth 2 (two) times daily.  01/05/13  Yes Historical Provider, MD  JANUMET 50-1000 MG per tablet Take 1 tablet by mouth 2 (two) times daily.  02/15/13  Yes Historical Provider, MD  lisinopril (PRINIVIL,ZESTRIL) 20 MG tablet Take 20 mg by mouth daily.  02/15/13  Yes Historical Provider, MD  mometasone (NASONEX) 50 MCG/ACT nasal spray Place 2 sprays into the nose daily as needed (for allergies).    Yes Historical Provider, MD  ciprofloxacin (CIPRO) 500 MG tablet Take 1 tablet (500 mg total) by mouth 2 (two) times daily. 12/03/13   Jasper Riling. Alvino Chapel, MD  ondansetron (ZOFRAN-ODT) 4 MG disintegrating tablet Take 1 tablet (4 mg total) by mouth every 8 (eight) hours as needed for nausea or vomiting. 12/04/13   Jasper Riling. Verlisa Vara, MD   BP 104/51  Pulse 88  Temp(Src) 98.5 F (36.9 C) (Oral)  Resp 24  Ht 5\' 8"  (1.727 m)  Wt 240 lb (108.863 kg)  BMI 36.50 kg/m2  SpO2 95% Physical Exam  Nursing note and vitals reviewed. Constitutional: He is oriented to person, place, and time. He appears well-developed and well-nourished.  HENT:  Head: Normocephalic and atraumatic.  Eyes: EOM are normal. Pupils are equal, round, and reactive to light.  Neck: Normal range of motion. Neck supple.  Cardiovascular: Normal rate, regular rhythm and normal heart sounds.   No murmur heard. Pulmonary/Chest: Effort normal and breath sounds normal.  Abdominal: Soft. Bowel sounds are normal. He exhibits no distension and no mass. There is no tenderness. There is no rebound and no guarding.  Genitourinary:  No CVA tenderness, but has right lower back tenderness and  Musculoskeletal: Normal range of motion. He exhibits no edema.   Neurological: He is alert and oriented to person, place, and time. No cranial nerve deficit.  Skin: Skin is warm and dry.  Psychiatric: He has a normal mood and affect.    ED Course  Procedures (including critical care time) Labs Review Labs Reviewed  CBC WITH DIFFERENTIAL - Abnormal; Notable for the following:    WBC 12.1 (*)    Neutrophils Relative % 88 (*)    Neutro Abs 10.6 (*)    Lymphocytes Relative 5 (*)    Lymphs Abs 0.6 (*)    All other components within normal limits  COMPREHENSIVE METABOLIC PANEL - Abnormal; Notable for the following:    Glucose, Bld 127 (*)    GFR calc non Af  Amer 89 (*)    All other components within normal limits  URINALYSIS, ROUTINE W REFLEX MICROSCOPIC - Abnormal; Notable for the following:    Hgb urine dipstick TRACE (*)    Ketones, ur 15 (*)    Leukocytes, UA MODERATE (*)    All other components within normal limits  URINE MICROSCOPIC-ADD ON - Abnormal; Notable for the following:    Bacteria, UA FEW (*)    All other components within normal limits  CBG MONITORING, ED - Abnormal; Notable for the following:    Glucose-Capillary 115 (*)    All other components within normal limits  URINE CULTURE    Imaging Review Dg Chest Portable 1 View  12/03/2013   CLINICAL DATA:  Fever.  Dizziness.  Short of breath.  EXAM: PORTABLE CHEST - 1 VIEW  COMPARISON:  09/19/2013  FINDINGS: Heart, mediastinum and hila are unremarkable. Lungs are clear. Right hemidiaphragm is elevated but stable. Bony thorax is intact.  IMPRESSION: No active disease.   Electronically Signed   By: Lajean Manes M.D.   On: 12/03/2013 20:58     EKG Interpretation None      MDM   Final diagnoses:  Prostatitis    Patient with fever. Has a UTI which we will treat as prostatitis. Culture sent. Will followup with primary care Dr.    Jasper Riling. Alvino Chapel, MD 12/04/13 [phone]

## 2013-12-05 LAB — URINE CULTURE

## 2013-12-06 ENCOUNTER — Telehealth (HOSPITAL_BASED_OUTPATIENT_CLINIC_OR_DEPARTMENT_OTHER): Payer: Self-pay | Admitting: Emergency Medicine

## 2013-12-06 NOTE — Telephone Encounter (Signed)
Post ED Visit - Positive Culture Follow-up  Culture report reviewed by antimicrobial stewardship pharmacist: []  Walker, Pharm.D., BCPS []  Heide Guile, Pharm.D., BCPS []  Alycia Rossetti, Pharm.D., BCPS []  [place], Pharm.D., BCPS, AAHIVP []  Legrand Como, Pharm.D., BCPS, AAHIVP []  Juliene Pina, Pharm.D. [x]  Assunta Curtis, Pharm.D.  Positive urine culture Treated with Cipro, organism sensitive to the same and no further patient follow-up is required at this time.  Myrna Blazer 12/06/2013, 1:59 PM

## 2014-01-01 DIAGNOSIS — L570 Actinic keratosis: Secondary | ICD-10-CM | POA: Diagnosis not present

## 2014-01-01 DIAGNOSIS — B351 Tinea unguium: Secondary | ICD-10-CM | POA: Diagnosis not present

## 2014-03-23 DIAGNOSIS — M109 Gout, unspecified: Secondary | ICD-10-CM | POA: Diagnosis not present

## 2014-03-23 DIAGNOSIS — Z125 Encounter for screening for malignant neoplasm of prostate: Secondary | ICD-10-CM | POA: Diagnosis not present

## 2014-03-23 DIAGNOSIS — I10 Essential (primary) hypertension: Secondary | ICD-10-CM | POA: Diagnosis not present

## 2014-03-23 DIAGNOSIS — E119 Type 2 diabetes mellitus without complications: Secondary | ICD-10-CM | POA: Diagnosis not present

## 2014-03-30 DIAGNOSIS — E119 Type 2 diabetes mellitus without complications: Secondary | ICD-10-CM | POA: Diagnosis not present

## 2014-03-30 DIAGNOSIS — I10 Essential (primary) hypertension: Secondary | ICD-10-CM | POA: Diagnosis not present

## 2014-03-30 DIAGNOSIS — G4733 Obstructive sleep apnea (adult) (pediatric): Secondary | ICD-10-CM | POA: Diagnosis not present

## 2014-03-30 DIAGNOSIS — Z1331 Encounter for screening for depression: Secondary | ICD-10-CM | POA: Diagnosis not present

## 2014-03-30 DIAGNOSIS — Z0000 Encounter for general adult medical examination without abnormal findings: Secondary | ICD-10-CM | POA: Diagnosis not present

## 2014-03-30 DIAGNOSIS — J984 Other disorders of lung: Secondary | ICD-10-CM | POA: Diagnosis not present

## 2014-03-30 DIAGNOSIS — M5430 Sciatica, unspecified side: Secondary | ICD-10-CM | POA: Diagnosis not present

## 2014-03-30 DIAGNOSIS — Z23 Encounter for immunization: Secondary | ICD-10-CM | POA: Diagnosis not present

## 2014-03-30 DIAGNOSIS — E669 Obesity, unspecified: Secondary | ICD-10-CM | POA: Diagnosis not present

## 2014-03-30 DIAGNOSIS — M109 Gout, unspecified: Secondary | ICD-10-CM | POA: Diagnosis not present

## 2014-04-02 DIAGNOSIS — Z1212 Encounter for screening for malignant neoplasm of rectum: Secondary | ICD-10-CM | POA: Diagnosis not present

## 2014-05-04 DIAGNOSIS — R05 Cough: Secondary | ICD-10-CM | POA: Diagnosis not present

## 2014-05-04 DIAGNOSIS — I10 Essential (primary) hypertension: Secondary | ICD-10-CM | POA: Diagnosis not present

## 2014-05-04 DIAGNOSIS — J069 Acute upper respiratory infection, unspecified: Secondary | ICD-10-CM | POA: Diagnosis not present

## 2014-06-21 ENCOUNTER — Encounter (HOSPITAL_COMMUNITY): Payer: Self-pay | Admitting: Cardiology

## 2014-07-30 DIAGNOSIS — E119 Type 2 diabetes mellitus without complications: Secondary | ICD-10-CM | POA: Diagnosis not present

## 2014-07-30 DIAGNOSIS — M109 Gout, unspecified: Secondary | ICD-10-CM | POA: Diagnosis not present

## 2014-07-30 DIAGNOSIS — E669 Obesity, unspecified: Secondary | ICD-10-CM | POA: Diagnosis not present

## 2014-07-30 DIAGNOSIS — K4090 Unilateral inguinal hernia, without obstruction or gangrene, not specified as recurrent: Secondary | ICD-10-CM | POA: Diagnosis not present

## 2014-07-30 DIAGNOSIS — Z6838 Body mass index (BMI) 38.0-38.9, adult: Secondary | ICD-10-CM | POA: Diagnosis not present

## 2014-07-30 DIAGNOSIS — I10 Essential (primary) hypertension: Secondary | ICD-10-CM | POA: Diagnosis not present

## 2014-08-02 IMAGING — CR DG CHEST 2V
2 series · 2 of 2 positions shown · non-contrast
Comparison: DG CHEST 1V PORT dated 09/01/2013

CLINICAL DATA: History of right lung mass and subsequent surgery in
Tuesday August, 2013

EXAM:
CHEST  2 VIEW

[w chest pa]
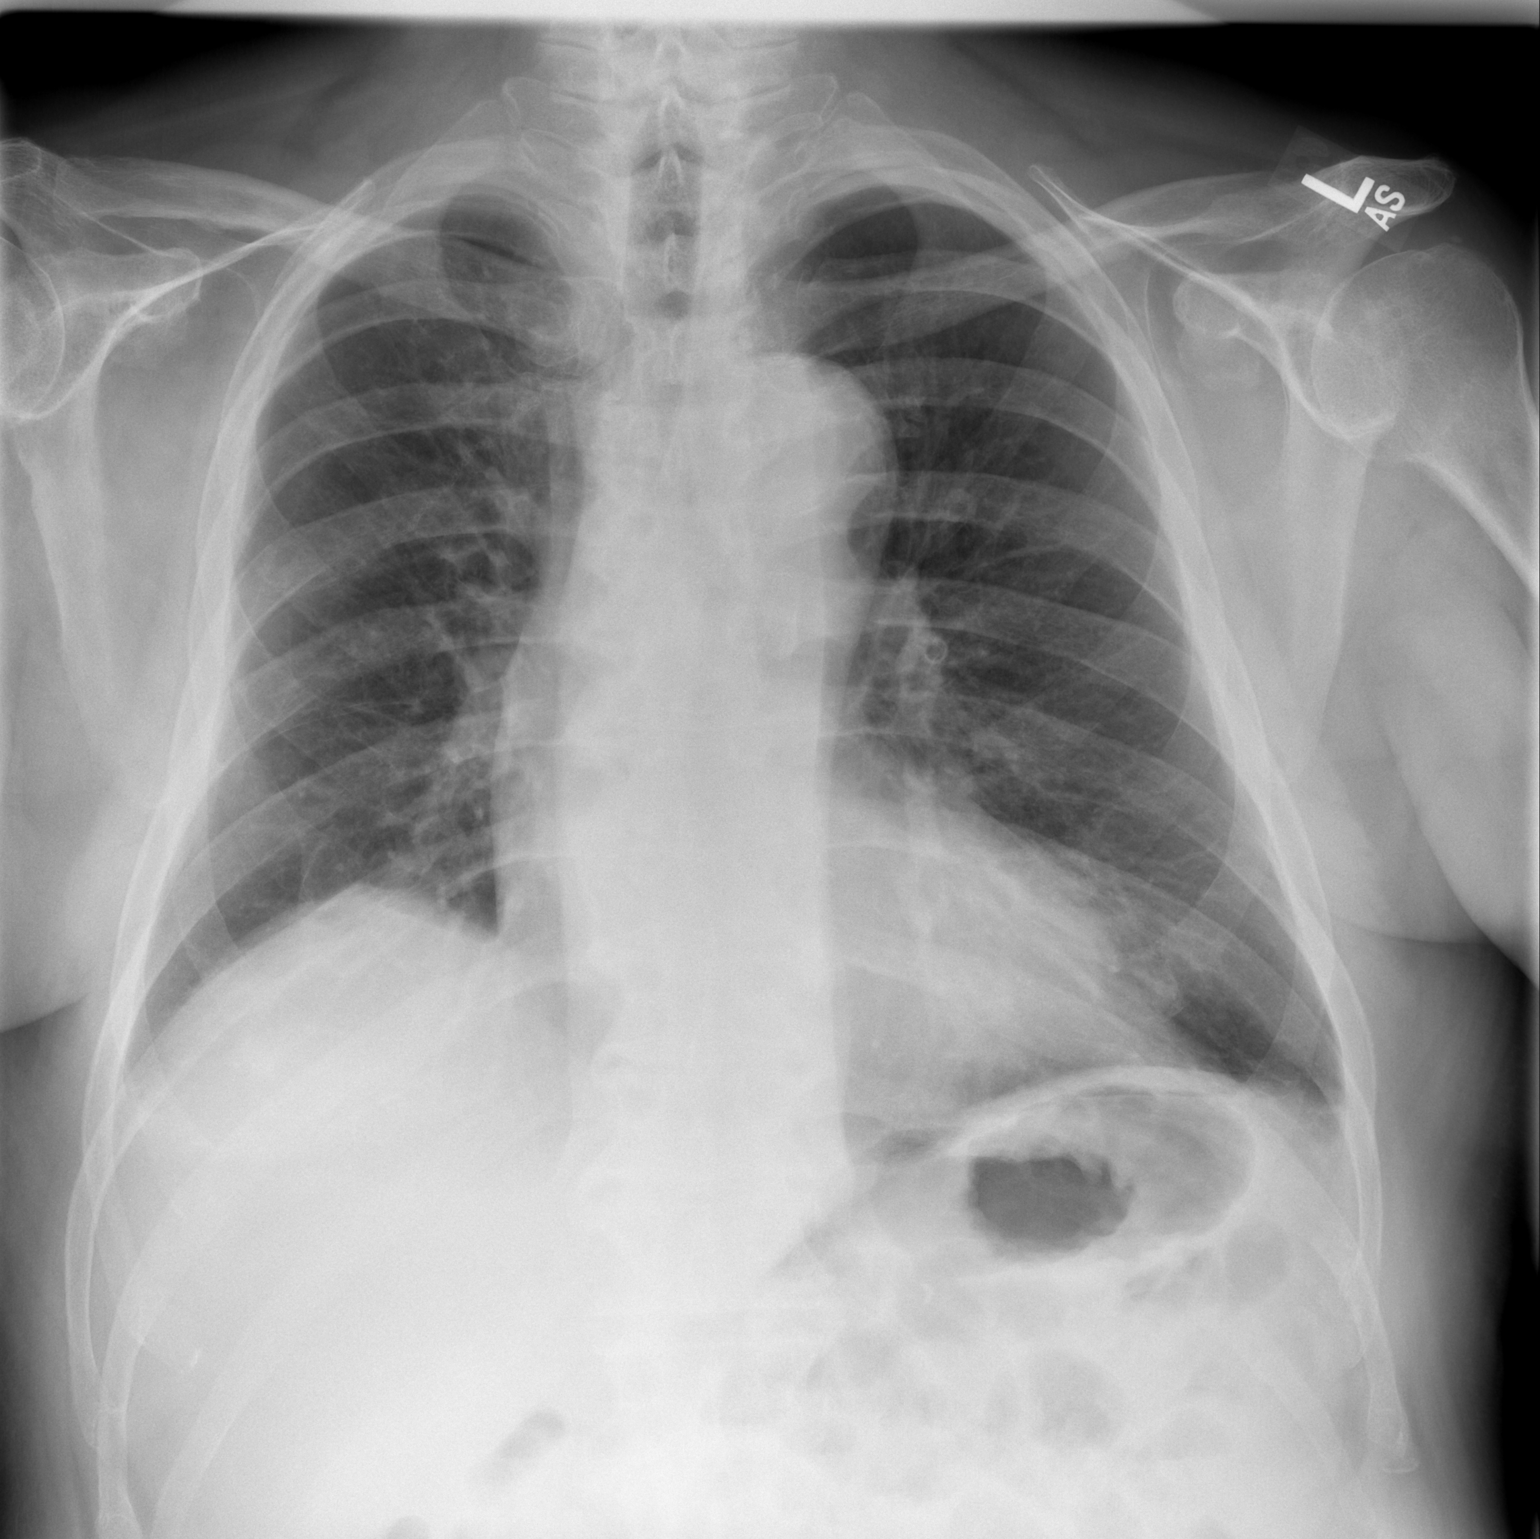

[w chest lat]
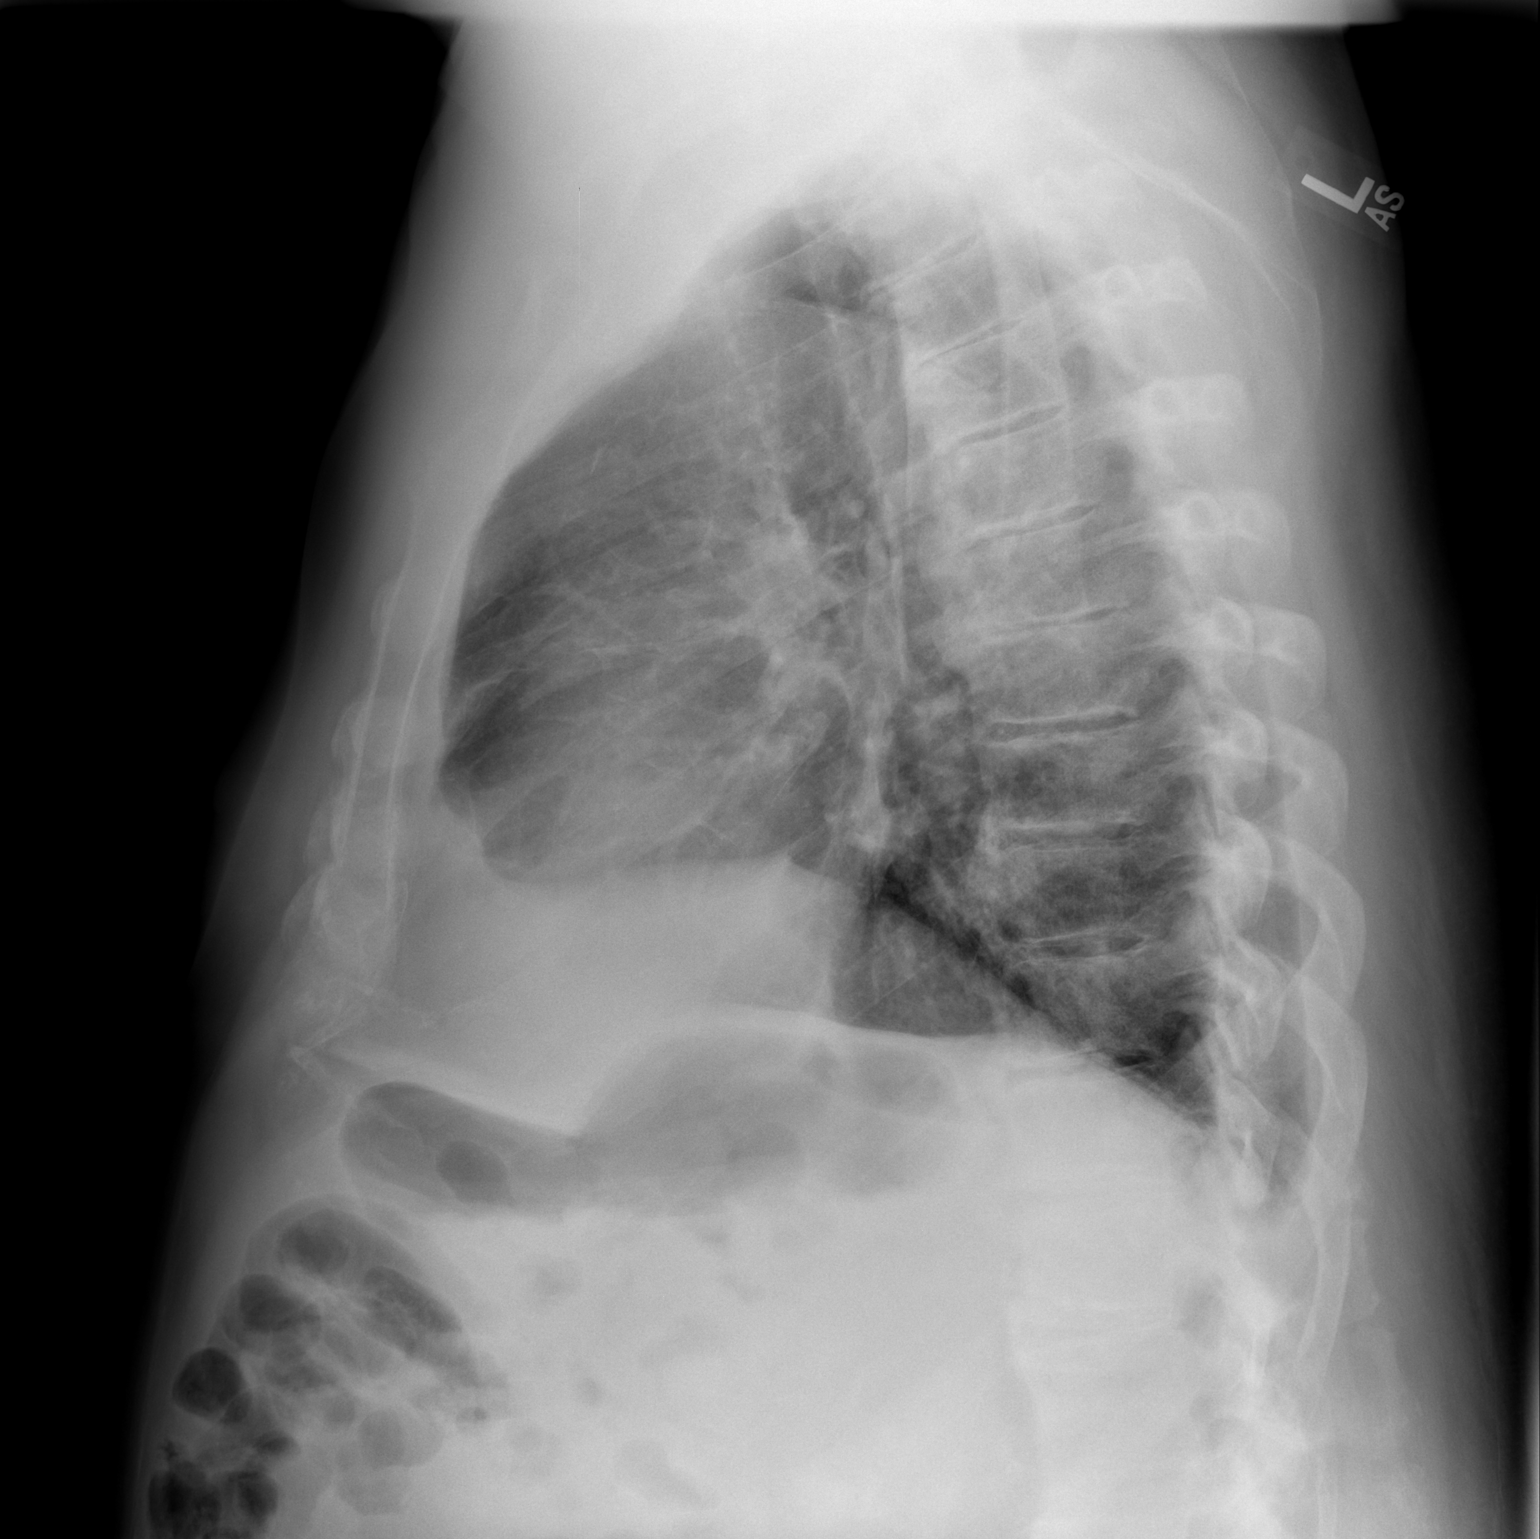

[2 of 2 positions shown; findings below may reference images not displayed]

FINDINGS: The right lung demonstrates mild volume loss but it is better
inflated than on the earlier study. There is no focal infiltrate.
The left lung is well expanded. There is a trace of blunting of the
left lateral costophrenic angle but the posterior costophrenic angle
is clear. The cardiac silhouette is top-normal in size. There is
tortuosity of the descending thoracic aorta. The trachea is midline.
IMPRESSION: 1. There is mild volume loss on the right consistent with previous
wedge resection but there is no evidence of pneumonia or new or
pneumothorax or pleural effusion. The left lung exhibits no acute
abnormality. No pulmonary parenchymal masses or nodules are
demonstrated.
2. There is no evidence of CHF.

## 2014-08-10 DIAGNOSIS — E119 Type 2 diabetes mellitus without complications: Secondary | ICD-10-CM | POA: Diagnosis not present

## 2014-08-10 DIAGNOSIS — Z6839 Body mass index (BMI) 39.0-39.9, adult: Secondary | ICD-10-CM | POA: Diagnosis not present

## 2014-08-10 DIAGNOSIS — I10 Essential (primary) hypertension: Secondary | ICD-10-CM | POA: Diagnosis not present

## 2014-08-10 DIAGNOSIS — J209 Acute bronchitis, unspecified: Secondary | ICD-10-CM | POA: Diagnosis not present

## 2014-08-10 DIAGNOSIS — R05 Cough: Secondary | ICD-10-CM | POA: Diagnosis not present

## 2014-09-06 ENCOUNTER — Emergency Department (INDEPENDENT_AMBULATORY_CARE_PROVIDER_SITE_OTHER): Payer: Medicare Other

## 2014-09-06 ENCOUNTER — Emergency Department (INDEPENDENT_AMBULATORY_CARE_PROVIDER_SITE_OTHER)
Admission: EM | Admit: 2014-09-06 | Discharge: 2014-09-06 | Disposition: A | Discharge status: Home / Self Care | Payer: Medicare Other | Source: Home / Self Care | Attending: Family Medicine | Admitting: Family Medicine

## 2014-09-06 ENCOUNTER — Encounter (HOSPITAL_COMMUNITY): Payer: Self-pay | Admitting: Emergency Medicine

## 2014-09-06 DIAGNOSIS — J208 Acute bronchitis due to other specified organisms: Secondary | ICD-10-CM

## 2014-09-06 DIAGNOSIS — R69 Illness, unspecified: Secondary | ICD-10-CM | POA: Diagnosis not present

## 2014-09-06 MED ORDER — MINOCYCLINE HCL 100 MG PO CAPS: 100.0000 mg | ORAL_CAPSULE | Freq: Two times a day (BID) | ORAL | Status: DC

## 2014-09-06 NOTE — Discharge Instructions (Signed)
Drink plenty of fluids as discussed, use medicine as prescribed, and mucinex or delsym for cough. Return or see your doctor if further problems °

## 2014-09-06 NOTE — ED Provider Notes (Signed)
CSN: [id number]     Arrival date & time 09/06/14  1844 History   First MD Initiated Contact with Patient 09/06/14 1952     Chief Complaint  Patient presents with  . URI   (Consider location/radiation/quality/duration/timing/severity/associated sxs/prior Treatment) Patient is a 69 y.o. male presenting with URI. The history is provided by the patient and the spouse.  URI Presenting symptoms: congestion, cough and fever   Presenting symptoms: no rhinorrhea and no sore throat   Severity:  Moderate Onset quality:  Gradual Duration:  3 days Progression:  Worsening (has had uri and seen by lmd 2 weeks ago.) Chronicity:  New Ineffective treatments:  Decongestant Associated symptoms: myalgias   Risk factors: recent illness     Past Medical History  Diagnosis Date  . Hypertension   . Gout   . Arthritis   . Sciatica   . Pneumonitis     right lung  . Leg edema   . Diabetes mellitus   . Allergic rhinitis   . Blood in urine   . DDD (degenerative disc disease)   . Eustachian tube dysfunction   . Hyperlipidemia   . Obesity   . Spinal stenosis   . Joint pain, knee   . Fatty liver   . Shortness of breath     with minimal exertion  . Calculus of kidney    Past Surgical History  Procedure Laterality Date  . Lumbar disectomy l4-5  1980  . Hemorrhoid surgery  2001  . Lithotripsy      x 2  . Transthoracic echocardiogram  02/2013    EF 65-70%, moderate LVH (basal septal hypertrophy); normal wall motion; mild LA Dilation; PA pressures 47 mmHg  . Cardiac catheterization  04/20/13    no PCI  . Back surgery    . Appendectomy  1976  . Hernia repair      as a baby 51 months old  . Video assisted thoracoscopy (vats)/wedge resection Right 08/30/2013    Procedure: VIDEO ASSISTED THORACOSCOPY (VATS)/WEDGE RESECTION;  Surgeon: Melrose Nakayama, MD;  Location: [place];  Service: Thoracic;  Laterality: Right;  . Left and right heart catheterization with coronary angiogram N/A 04/20/2013   Procedure: LEFT AND RIGHT HEART CATHETERIZATION WITH CORONARY ANGIOGRAM;  Surgeon: Leonie Man, MD;  Location: [facility] CATH LAB;  Service: Cardiovascular;  Laterality: N/A;   Family History  Problem Relation Age of Onset  . Leukemia Father   . Breast cancer Maternal Grandmother   . Hypertension Maternal Grandfather    History  Substance Use Topics  . Smoking status: Former Smoker -- 1.50 packs/day for 42 years    Types: Cigarettes    Quit date: 07/13/2002  . Smokeless tobacco: Never Used  . Alcohol Use: 1.8 oz/week    3 Cans of beer per week     Comment: 3 tmes weekly    Review of Systems  Constitutional: Positive for fever and chills.  HENT: Positive for congestion. Negative for postnasal drip, rhinorrhea and sore throat.   Respiratory: Positive for cough and shortness of breath.   Cardiovascular: Negative.   Musculoskeletal: Positive for myalgias.    Allergies  Review of patient's allergies indicates no known allergies.  Home Medications   Prior to Admission medications   Medication Sig Start Date End Date Taking? Authorizing Provider  aspirin EC 81 MG tablet Take 81 mg by mouth daily.    Historical Provider, MD  cetirizine (ZYRTEC) 10 MG tablet Take 10 mg by mouth daily.  Historical Provider, MD  ciprofloxacin (CIPRO) 500 MG tablet Take 1 tablet (500 mg total) by mouth 2 (two) times daily. 12/03/13   Jasper Riling. Pickering, MD  colchicine 0.6 MG tablet Take 0.6 mg by mouth 2 (two) times daily as needed (for gout). PRN for Gout    Historical Provider, MD  diclofenac (VOLTAREN) 50 MG EC tablet Take 50 mg by mouth 2 (two) times daily.  09/11/13   Historical Provider, MD  furosemide (LASIX) 40 MG tablet Take 40 mg by mouth as needed for fluid.  02/09/13   Historical Provider, MD  gabapentin (NEURONTIN) 300 MG capsule Take 300 mg by mouth 2 (two) times daily.  01/05/13   Historical Provider, MD  JANUMET 50-1000 MG per tablet Take 1 tablet by mouth 2 (two) times daily.  02/15/13    Historical Provider, MD  lisinopril (PRINIVIL,ZESTRIL) 20 MG tablet Take 20 mg by mouth daily.  02/15/13   Historical Provider, MD  minocycline (MINOCIN,DYNACIN) 100 MG capsule Take 1 capsule (100 mg total) by mouth 2 (two) times daily. 09/06/14   Billy Fischer, MD  mometasone (NASONEX) 50 MCG/ACT nasal spray Place 2 sprays into the nose daily as needed (for allergies).     Historical Provider, MD  ondansetron (ZOFRAN-ODT) 4 MG disintegrating tablet Take 1 tablet (4 mg total) by mouth every 8 (eight) hours as needed for nausea or vomiting. 12/04/13   Jasper Riling. Pickering, MD   BP 161/81 mmHg  Pulse 88  Temp(Src) 99.3 F (37.4 C) (Oral)  Resp 16  SpO2 94% Physical Exam  Constitutional: He is oriented to person, place, and time. He appears well-developed and well-nourished. No distress.  HENT:  Right Ear: External ear normal.  Left Ear: External ear normal.  Mouth/Throat: Oropharynx is clear and moist.  Neck: Normal range of motion. Neck supple.  Pulmonary/Chest: Effort normal. He has decreased breath sounds in the left upper field and the left middle field. He has rales in the left upper field and the left middle field.  Lymphadenopathy:    He has no cervical adenopathy.  Neurological: He is alert and oriented to person, place, and time.  Skin: Skin is warm and dry.  Nursing note and vitals reviewed.   ED Course  Procedures (including critical care time) Labs Review Labs Reviewed - No data to display  Imaging Review Dg Chest 2 View  09/06/2014   CLINICAL DATA:  Sick for 2 weeks.  History of right lung surgery.  EXAM: CHEST  2 VIEW  COMPARISON:  12/03/2013  FINDINGS: Chronic volume loss in the right hemithorax related to prior chest surgery. The right hemidiaphragm is elevated. The lungs are clear without airspace disease. Heart and mediastinum are within normal limits. The trachea is midline. Negative for pleural effusions.  IMPRESSION: No acute chest findings.   Electronically Signed    By: Markus Daft M.D.   On: 09/06/2014 20:32    X-rays reviewed and report per radiologist.  MDM   1. Acute bronchitis due to other specified organisms        Billy Fischer, MD 09/06/14 2054

## 2014-09-06 NOTE — ED Notes (Signed)
General aches, cold symptoms, cough, reports productive cough-yellow phlegm.  Temp 99 at home and wife (nurse) reports sat 90% at home

## 2014-09-21 DIAGNOSIS — R05 Cough: Secondary | ICD-10-CM | POA: Diagnosis not present

## 2014-09-21 DIAGNOSIS — Z6838 Body mass index (BMI) 38.0-38.9, adult: Secondary | ICD-10-CM | POA: Diagnosis not present

## 2014-09-21 DIAGNOSIS — R6883 Chills (without fever): Secondary | ICD-10-CM | POA: Diagnosis not present

## 2014-09-21 DIAGNOSIS — J019 Acute sinusitis, unspecified: Secondary | ICD-10-CM | POA: Diagnosis not present

## 2014-10-15 DIAGNOSIS — Z6838 Body mass index (BMI) 38.0-38.9, adult: Secondary | ICD-10-CM | POA: Diagnosis not present

## 2014-10-15 DIAGNOSIS — M25552 Pain in left hip: Secondary | ICD-10-CM | POA: Diagnosis not present

## 2014-10-31 DIAGNOSIS — E119 Type 2 diabetes mellitus without complications: Secondary | ICD-10-CM | POA: Diagnosis not present

## 2014-10-31 DIAGNOSIS — H2513 Age-related nuclear cataract, bilateral: Secondary | ICD-10-CM | POA: Diagnosis not present

## 2014-10-31 DIAGNOSIS — H5203 Hypermetropia, bilateral: Secondary | ICD-10-CM | POA: Diagnosis not present

## 2014-10-31 DIAGNOSIS — H43393 Other vitreous opacities, bilateral: Secondary | ICD-10-CM | POA: Diagnosis not present

## 2014-11-05 DIAGNOSIS — T161XXA Foreign body in right ear, initial encounter: Secondary | ICD-10-CM | POA: Diagnosis not present

## 2014-11-27 DIAGNOSIS — G629 Polyneuropathy, unspecified: Secondary | ICD-10-CM | POA: Diagnosis not present

## 2014-11-27 DIAGNOSIS — I10 Essential (primary) hypertension: Secondary | ICD-10-CM | POA: Diagnosis not present

## 2014-11-27 DIAGNOSIS — Z6838 Body mass index (BMI) 38.0-38.9, adult: Secondary | ICD-10-CM | POA: Diagnosis not present

## 2014-11-27 DIAGNOSIS — E119 Type 2 diabetes mellitus without complications: Secondary | ICD-10-CM | POA: Diagnosis not present

## 2014-12-12 DIAGNOSIS — C44222 Squamous cell carcinoma of skin of right ear and external auricular canal: Secondary | ICD-10-CM | POA: Diagnosis not present

## 2014-12-12 DIAGNOSIS — B354 Tinea corporis: Secondary | ICD-10-CM | POA: Diagnosis not present

## 2015-01-16 DIAGNOSIS — C44222 Squamous cell carcinoma of skin of right ear and external auricular canal: Secondary | ICD-10-CM | POA: Diagnosis not present

## 2015-04-01 DIAGNOSIS — I10 Essential (primary) hypertension: Secondary | ICD-10-CM | POA: Diagnosis not present

## 2015-04-01 DIAGNOSIS — Z125 Encounter for screening for malignant neoplasm of prostate: Secondary | ICD-10-CM | POA: Diagnosis not present

## 2015-04-01 DIAGNOSIS — E119 Type 2 diabetes mellitus without complications: Secondary | ICD-10-CM | POA: Diagnosis not present

## 2015-04-09 DIAGNOSIS — I10 Essential (primary) hypertension: Secondary | ICD-10-CM | POA: Diagnosis not present

## 2015-04-09 DIAGNOSIS — Z23 Encounter for immunization: Secondary | ICD-10-CM | POA: Diagnosis not present

## 2015-04-09 DIAGNOSIS — E785 Hyperlipidemia, unspecified: Secondary | ICD-10-CM | POA: Diagnosis not present

## 2015-04-09 DIAGNOSIS — R600 Localized edema: Secondary | ICD-10-CM | POA: Diagnosis not present

## 2015-04-09 DIAGNOSIS — M5136 Other intervertebral disc degeneration, lumbar region: Secondary | ICD-10-CM | POA: Diagnosis not present

## 2015-04-09 DIAGNOSIS — E119 Type 2 diabetes mellitus without complications: Secondary | ICD-10-CM | POA: Diagnosis not present

## 2015-04-09 DIAGNOSIS — Z0000 Encounter for general adult medical examination without abnormal findings: Secondary | ICD-10-CM | POA: Diagnosis not present

## 2015-04-09 DIAGNOSIS — E1140 Type 2 diabetes mellitus with diabetic neuropathy, unspecified: Secondary | ICD-10-CM | POA: Diagnosis not present

## 2015-04-09 DIAGNOSIS — Z1389 Encounter for screening for other disorder: Secondary | ICD-10-CM | POA: Diagnosis not present

## 2015-04-09 DIAGNOSIS — G4733 Obstructive sleep apnea (adult) (pediatric): Secondary | ICD-10-CM | POA: Diagnosis not present

## 2015-07-23 DIAGNOSIS — J0100 Acute maxillary sinusitis, unspecified: Secondary | ICD-10-CM | POA: Diagnosis not present

## 2015-07-23 DIAGNOSIS — Z6838 Body mass index (BMI) 38.0-38.9, adult: Secondary | ICD-10-CM | POA: Diagnosis not present

## 2015-07-23 DIAGNOSIS — R05 Cough: Secondary | ICD-10-CM | POA: Diagnosis not present

## 2015-07-23 DIAGNOSIS — I10 Essential (primary) hypertension: Secondary | ICD-10-CM | POA: Diagnosis not present

## 2015-07-23 DIAGNOSIS — E119 Type 2 diabetes mellitus without complications: Secondary | ICD-10-CM | POA: Diagnosis not present

## 2015-07-23 DIAGNOSIS — G4733 Obstructive sleep apnea (adult) (pediatric): Secondary | ICD-10-CM | POA: Diagnosis not present

## 2015-08-16 DIAGNOSIS — E119 Type 2 diabetes mellitus without complications: Secondary | ICD-10-CM | POA: Diagnosis not present

## 2015-08-16 DIAGNOSIS — I10 Essential (primary) hypertension: Secondary | ICD-10-CM | POA: Diagnosis not present

## 2015-08-16 DIAGNOSIS — E784 Other hyperlipidemia: Secondary | ICD-10-CM | POA: Diagnosis not present

## 2015-11-04 DIAGNOSIS — H5203 Hypermetropia, bilateral: Secondary | ICD-10-CM | POA: Diagnosis not present

## 2015-11-04 DIAGNOSIS — H2513 Age-related nuclear cataract, bilateral: Secondary | ICD-10-CM | POA: Diagnosis not present

## 2015-11-04 DIAGNOSIS — H25043 Posterior subcapsular polar age-related cataract, bilateral: Secondary | ICD-10-CM | POA: Diagnosis not present

## 2015-11-04 DIAGNOSIS — E119 Type 2 diabetes mellitus without complications: Secondary | ICD-10-CM | POA: Diagnosis not present

## 2015-11-04 DIAGNOSIS — H43393 Other vitreous opacities, bilateral: Secondary | ICD-10-CM | POA: Diagnosis not present

## 2015-11-19 DIAGNOSIS — E119 Type 2 diabetes mellitus without complications: Secondary | ICD-10-CM | POA: Diagnosis not present

## 2015-11-19 DIAGNOSIS — E784 Other hyperlipidemia: Secondary | ICD-10-CM | POA: Diagnosis not present

## 2015-11-19 DIAGNOSIS — M109 Gout, unspecified: Secondary | ICD-10-CM | POA: Diagnosis not present

## 2015-11-19 DIAGNOSIS — I10 Essential (primary) hypertension: Secondary | ICD-10-CM | POA: Diagnosis not present

## 2015-11-19 DIAGNOSIS — E1140 Type 2 diabetes mellitus with diabetic neuropathy, unspecified: Secondary | ICD-10-CM | POA: Diagnosis not present

## 2015-11-19 DIAGNOSIS — Z6838 Body mass index (BMI) 38.0-38.9, adult: Secondary | ICD-10-CM | POA: Diagnosis not present

## 2016-01-20 DIAGNOSIS — Z85820 Personal history of malignant melanoma of skin: Secondary | ICD-10-CM | POA: Diagnosis not present

## 2016-01-20 DIAGNOSIS — Z08 Encounter for follow-up examination after completed treatment for malignant neoplasm: Secondary | ICD-10-CM | POA: Diagnosis not present

## 2016-01-20 DIAGNOSIS — L570 Actinic keratosis: Secondary | ICD-10-CM | POA: Diagnosis not present

## 2016-01-21 DIAGNOSIS — T161XXA Foreign body in right ear, initial encounter: Secondary | ICD-10-CM | POA: Diagnosis not present

## 2016-01-22 DIAGNOSIS — Z6838 Body mass index (BMI) 38.0-38.9, adult: Secondary | ICD-10-CM | POA: Diagnosis not present

## 2016-01-22 DIAGNOSIS — M4696 Unspecified inflammatory spondylopathy, lumbar region: Secondary | ICD-10-CM | POA: Diagnosis not present

## 2016-01-22 DIAGNOSIS — S39012A Strain of muscle, fascia and tendon of lower back, initial encounter: Secondary | ICD-10-CM | POA: Diagnosis not present

## 2016-01-22 DIAGNOSIS — E119 Type 2 diabetes mellitus without complications: Secondary | ICD-10-CM | POA: Diagnosis not present

## 2016-02-19 DIAGNOSIS — T148 Other injury of unspecified body region: Secondary | ICD-10-CM | POA: Diagnosis not present

## 2016-02-19 DIAGNOSIS — E784 Other hyperlipidemia: Secondary | ICD-10-CM | POA: Diagnosis not present

## 2016-02-19 DIAGNOSIS — Z0000 Encounter for general adult medical examination without abnormal findings: Secondary | ICD-10-CM | POA: Diagnosis not present

## 2016-02-19 DIAGNOSIS — Z6837 Body mass index (BMI) 37.0-37.9, adult: Secondary | ICD-10-CM | POA: Diagnosis not present

## 2016-02-19 DIAGNOSIS — I10 Essential (primary) hypertension: Secondary | ICD-10-CM | POA: Diagnosis not present

## 2016-02-19 DIAGNOSIS — E1140 Type 2 diabetes mellitus with diabetic neuropathy, unspecified: Secondary | ICD-10-CM | POA: Diagnosis not present

## 2016-03-14 DIAGNOSIS — S00261A Insect bite (nonvenomous) of right eyelid and periocular area, initial encounter: Secondary | ICD-10-CM | POA: Diagnosis not present

## 2016-05-12 ENCOUNTER — Encounter: Payer: Self-pay | Admitting: Nurse Practitioner

## 2016-05-12 DIAGNOSIS — H2513 Age-related nuclear cataract, bilateral: Secondary | ICD-10-CM | POA: Diagnosis not present

## 2016-05-12 DIAGNOSIS — E119 Type 2 diabetes mellitus without complications: Secondary | ICD-10-CM | POA: Diagnosis not present

## 2016-05-12 DIAGNOSIS — I10 Essential (primary) hypertension: Secondary | ICD-10-CM | POA: Diagnosis not present

## 2016-05-12 DIAGNOSIS — H524 Presbyopia: Secondary | ICD-10-CM | POA: Diagnosis not present

## 2016-05-12 DIAGNOSIS — H5203 Hypermetropia, bilateral: Secondary | ICD-10-CM | POA: Diagnosis not present

## 2016-05-12 DIAGNOSIS — H52203 Unspecified astigmatism, bilateral: Secondary | ICD-10-CM | POA: Diagnosis not present

## 2016-05-12 DIAGNOSIS — E784 Other hyperlipidemia: Secondary | ICD-10-CM | POA: Diagnosis not present

## 2016-05-12 DIAGNOSIS — Z125 Encounter for screening for malignant neoplasm of prostate: Secondary | ICD-10-CM | POA: Diagnosis not present

## 2016-05-12 DIAGNOSIS — M109 Gout, unspecified: Secondary | ICD-10-CM | POA: Diagnosis not present

## 2016-05-12 DIAGNOSIS — H25043 Posterior subcapsular polar age-related cataract, bilateral: Secondary | ICD-10-CM | POA: Diagnosis not present

## 2016-05-19 DIAGNOSIS — Z6838 Body mass index (BMI) 38.0-38.9, adult: Secondary | ICD-10-CM | POA: Diagnosis not present

## 2016-05-19 DIAGNOSIS — G4733 Obstructive sleep apnea (adult) (pediatric): Secondary | ICD-10-CM | POA: Diagnosis not present

## 2016-05-19 DIAGNOSIS — Z0000 Encounter for general adult medical examination without abnormal findings: Secondary | ICD-10-CM | POA: Diagnosis not present

## 2016-05-19 DIAGNOSIS — E785 Hyperlipidemia, unspecified: Secondary | ICD-10-CM | POA: Diagnosis not present

## 2016-05-19 DIAGNOSIS — Z23 Encounter for immunization: Secondary | ICD-10-CM | POA: Diagnosis not present

## 2016-05-19 DIAGNOSIS — I10 Essential (primary) hypertension: Secondary | ICD-10-CM | POA: Diagnosis not present

## 2016-05-19 DIAGNOSIS — M5412 Radiculopathy, cervical region: Secondary | ICD-10-CM | POA: Diagnosis not present

## 2016-05-19 DIAGNOSIS — M109 Gout, unspecified: Secondary | ICD-10-CM | POA: Diagnosis not present

## 2016-05-19 DIAGNOSIS — E119 Type 2 diabetes mellitus without complications: Secondary | ICD-10-CM | POA: Diagnosis not present

## 2016-05-19 DIAGNOSIS — E1140 Type 2 diabetes mellitus with diabetic neuropathy, unspecified: Secondary | ICD-10-CM | POA: Diagnosis not present

## 2016-05-19 DIAGNOSIS — Z1389 Encounter for screening for other disorder: Secondary | ICD-10-CM | POA: Diagnosis not present

## 2016-05-31 DIAGNOSIS — J069 Acute upper respiratory infection, unspecified: Secondary | ICD-10-CM | POA: Diagnosis not present

## 2016-06-03 DIAGNOSIS — J209 Acute bronchitis, unspecified: Secondary | ICD-10-CM | POA: Diagnosis not present

## 2016-06-03 DIAGNOSIS — R05 Cough: Secondary | ICD-10-CM | POA: Diagnosis not present

## 2016-06-03 DIAGNOSIS — Z6839 Body mass index (BMI) 39.0-39.9, adult: Secondary | ICD-10-CM | POA: Diagnosis not present

## 2016-06-23 LAB — IFOBT (OCCULT BLOOD): IFOBT: NEGATIVE

## 2016-06-24 DIAGNOSIS — Z1212 Encounter for screening for malignant neoplasm of rectum: Secondary | ICD-10-CM | POA: Diagnosis not present

## 2016-08-17 DIAGNOSIS — Z6838 Body mass index (BMI) 38.0-38.9, adult: Secondary | ICD-10-CM | POA: Diagnosis not present

## 2016-08-17 DIAGNOSIS — I10 Essential (primary) hypertension: Secondary | ICD-10-CM | POA: Diagnosis not present

## 2016-08-17 DIAGNOSIS — E119 Type 2 diabetes mellitus without complications: Secondary | ICD-10-CM | POA: Diagnosis not present

## 2016-08-17 DIAGNOSIS — R05 Cough: Secondary | ICD-10-CM | POA: Diagnosis not present

## 2016-09-03 DIAGNOSIS — E119 Type 2 diabetes mellitus without complications: Secondary | ICD-10-CM | POA: Diagnosis not present

## 2016-09-03 DIAGNOSIS — I10 Essential (primary) hypertension: Secondary | ICD-10-CM | POA: Diagnosis not present

## 2016-10-05 DIAGNOSIS — J449 Chronic obstructive pulmonary disease, unspecified: Secondary | ICD-10-CM | POA: Diagnosis not present

## 2016-10-05 DIAGNOSIS — J984 Other disorders of lung: Secondary | ICD-10-CM | POA: Diagnosis not present

## 2016-10-05 DIAGNOSIS — I10 Essential (primary) hypertension: Secondary | ICD-10-CM | POA: Diagnosis not present

## 2016-10-05 DIAGNOSIS — R0609 Other forms of dyspnea: Secondary | ICD-10-CM | POA: Diagnosis not present

## 2016-10-18 DIAGNOSIS — R0602 Shortness of breath: Secondary | ICD-10-CM | POA: Diagnosis not present

## 2016-10-18 DIAGNOSIS — R05 Cough: Secondary | ICD-10-CM | POA: Diagnosis not present

## 2016-10-18 DIAGNOSIS — J069 Acute upper respiratory infection, unspecified: Secondary | ICD-10-CM | POA: Diagnosis not present

## 2016-10-28 DIAGNOSIS — J302 Other seasonal allergic rhinitis: Secondary | ICD-10-CM | POA: Diagnosis not present

## 2016-10-28 DIAGNOSIS — Z6838 Body mass index (BMI) 38.0-38.9, adult: Secondary | ICD-10-CM | POA: Diagnosis not present

## 2016-10-28 DIAGNOSIS — J45909 Unspecified asthma, uncomplicated: Secondary | ICD-10-CM | POA: Diagnosis not present

## 2016-10-28 DIAGNOSIS — R0609 Other forms of dyspnea: Secondary | ICD-10-CM | POA: Diagnosis not present

## 2016-11-05 DIAGNOSIS — I10 Essential (primary) hypertension: Secondary | ICD-10-CM | POA: Diagnosis not present

## 2016-11-18 DIAGNOSIS — I10 Essential (primary) hypertension: Secondary | ICD-10-CM | POA: Diagnosis not present

## 2016-11-26 DIAGNOSIS — I10 Essential (primary) hypertension: Secondary | ICD-10-CM | POA: Diagnosis not present

## 2016-12-01 DIAGNOSIS — H5203 Hypermetropia, bilateral: Secondary | ICD-10-CM | POA: Insufficient documentation

## 2016-12-01 DIAGNOSIS — H2512 Age-related nuclear cataract, left eye: Secondary | ICD-10-CM | POA: Insufficient documentation

## 2016-12-01 DIAGNOSIS — H52203 Unspecified astigmatism, bilateral: Secondary | ICD-10-CM | POA: Diagnosis not present

## 2016-12-01 DIAGNOSIS — H43393 Other vitreous opacities, bilateral: Secondary | ICD-10-CM | POA: Diagnosis not present

## 2016-12-01 DIAGNOSIS — H25043 Posterior subcapsular polar age-related cataract, bilateral: Secondary | ICD-10-CM | POA: Insufficient documentation

## 2016-12-01 DIAGNOSIS — H524 Presbyopia: Secondary | ICD-10-CM | POA: Diagnosis not present

## 2016-12-01 DIAGNOSIS — I10 Essential (primary) hypertension: Secondary | ICD-10-CM | POA: Diagnosis not present

## 2016-12-01 DIAGNOSIS — Z9849 Cataract extraction status, unspecified eye: Secondary | ICD-10-CM | POA: Insufficient documentation

## 2016-12-01 DIAGNOSIS — H2513 Age-related nuclear cataract, bilateral: Secondary | ICD-10-CM | POA: Insufficient documentation

## 2016-12-01 DIAGNOSIS — E119 Type 2 diabetes mellitus without complications: Secondary | ICD-10-CM | POA: Insufficient documentation

## 2016-12-01 HISTORY — DX: Age-related nuclear cataract, left eye: H25.12

## 2016-12-04 DIAGNOSIS — H40213 Acute angle-closure glaucoma, bilateral: Secondary | ICD-10-CM | POA: Insufficient documentation

## 2016-12-04 DIAGNOSIS — H40211 Acute angle-closure glaucoma, right eye: Secondary | ICD-10-CM | POA: Diagnosis not present

## 2016-12-08 DIAGNOSIS — H40212 Acute angle-closure glaucoma, left eye: Secondary | ICD-10-CM | POA: Diagnosis not present

## 2016-12-17 DIAGNOSIS — Z6838 Body mass index (BMI) 38.0-38.9, adult: Secondary | ICD-10-CM | POA: Diagnosis not present

## 2016-12-17 DIAGNOSIS — E119 Type 2 diabetes mellitus without complications: Secondary | ICD-10-CM | POA: Diagnosis not present

## 2016-12-17 DIAGNOSIS — I10 Essential (primary) hypertension: Secondary | ICD-10-CM | POA: Diagnosis not present

## 2016-12-17 DIAGNOSIS — R252 Cramp and spasm: Secondary | ICD-10-CM | POA: Diagnosis not present

## 2016-12-17 DIAGNOSIS — Z1389 Encounter for screening for other disorder: Secondary | ICD-10-CM | POA: Diagnosis not present

## 2016-12-17 DIAGNOSIS — E784 Other hyperlipidemia: Secondary | ICD-10-CM | POA: Diagnosis not present

## 2017-01-12 DIAGNOSIS — H02834 Dermatochalasis of left upper eyelid: Secondary | ICD-10-CM | POA: Diagnosis not present

## 2017-01-12 DIAGNOSIS — H02831 Dermatochalasis of right upper eyelid: Secondary | ICD-10-CM | POA: Diagnosis not present

## 2017-01-20 DIAGNOSIS — Z85828 Personal history of other malignant neoplasm of skin: Secondary | ICD-10-CM | POA: Diagnosis not present

## 2017-01-20 DIAGNOSIS — L570 Actinic keratosis: Secondary | ICD-10-CM | POA: Diagnosis not present

## 2017-01-20 DIAGNOSIS — Z08 Encounter for follow-up examination after completed treatment for malignant neoplasm: Secondary | ICD-10-CM | POA: Diagnosis not present

## 2017-02-25 DIAGNOSIS — H02831 Dermatochalasis of right upper eyelid: Secondary | ICD-10-CM | POA: Insufficient documentation

## 2017-02-25 DIAGNOSIS — H02834 Dermatochalasis of left upper eyelid: Secondary | ICD-10-CM

## 2017-04-29 DIAGNOSIS — J302 Other seasonal allergic rhinitis: Secondary | ICD-10-CM | POA: Diagnosis not present

## 2017-04-29 DIAGNOSIS — Z6838 Body mass index (BMI) 38.0-38.9, adult: Secondary | ICD-10-CM | POA: Diagnosis not present

## 2017-04-29 DIAGNOSIS — J984 Other disorders of lung: Secondary | ICD-10-CM | POA: Diagnosis not present

## 2017-05-17 DIAGNOSIS — E119 Type 2 diabetes mellitus without complications: Secondary | ICD-10-CM | POA: Diagnosis not present

## 2017-05-17 DIAGNOSIS — I10 Essential (primary) hypertension: Secondary | ICD-10-CM | POA: Diagnosis not present

## 2017-05-17 DIAGNOSIS — Z125 Encounter for screening for malignant neoplasm of prostate: Secondary | ICD-10-CM | POA: Diagnosis not present

## 2017-05-17 DIAGNOSIS — M109 Gout, unspecified: Secondary | ICD-10-CM | POA: Diagnosis not present

## 2017-05-17 DIAGNOSIS — R82998 Other abnormal findings in urine: Secondary | ICD-10-CM | POA: Diagnosis not present

## 2017-05-17 DIAGNOSIS — E7849 Other hyperlipidemia: Secondary | ICD-10-CM | POA: Diagnosis not present

## 2017-05-26 DIAGNOSIS — M5136 Other intervertebral disc degeneration, lumbar region: Secondary | ICD-10-CM | POA: Diagnosis not present

## 2017-05-26 DIAGNOSIS — Z1389 Encounter for screening for other disorder: Secondary | ICD-10-CM | POA: Diagnosis not present

## 2017-05-26 DIAGNOSIS — E1140 Type 2 diabetes mellitus with diabetic neuropathy, unspecified: Secondary | ICD-10-CM | POA: Diagnosis not present

## 2017-05-26 DIAGNOSIS — I10 Essential (primary) hypertension: Secondary | ICD-10-CM | POA: Diagnosis not present

## 2017-05-26 DIAGNOSIS — E7849 Other hyperlipidemia: Secondary | ICD-10-CM | POA: Diagnosis not present

## 2017-05-26 DIAGNOSIS — R918 Other nonspecific abnormal finding of lung field: Secondary | ICD-10-CM | POA: Diagnosis not present

## 2017-05-26 DIAGNOSIS — J984 Other disorders of lung: Secondary | ICD-10-CM | POA: Diagnosis not present

## 2017-05-26 DIAGNOSIS — Z6838 Body mass index (BMI) 38.0-38.9, adult: Secondary | ICD-10-CM | POA: Diagnosis not present

## 2017-05-26 DIAGNOSIS — N183 Chronic kidney disease, stage 3 (moderate): Secondary | ICD-10-CM | POA: Diagnosis not present

## 2017-05-26 DIAGNOSIS — Z0000 Encounter for general adult medical examination without abnormal findings: Secondary | ICD-10-CM | POA: Diagnosis not present

## 2017-05-26 DIAGNOSIS — G4733 Obstructive sleep apnea (adult) (pediatric): Secondary | ICD-10-CM | POA: Diagnosis not present

## 2017-06-07 DIAGNOSIS — H2513 Age-related nuclear cataract, bilateral: Secondary | ICD-10-CM | POA: Diagnosis not present

## 2017-06-07 DIAGNOSIS — H524 Presbyopia: Secondary | ICD-10-CM | POA: Diagnosis not present

## 2017-06-07 DIAGNOSIS — H5203 Hypermetropia, bilateral: Secondary | ICD-10-CM | POA: Diagnosis not present

## 2017-06-07 DIAGNOSIS — H25043 Posterior subcapsular polar age-related cataract, bilateral: Secondary | ICD-10-CM | POA: Diagnosis not present

## 2017-06-07 DIAGNOSIS — H52203 Unspecified astigmatism, bilateral: Secondary | ICD-10-CM | POA: Diagnosis not present

## 2017-09-28 DIAGNOSIS — Z6839 Body mass index (BMI) 39.0-39.9, adult: Secondary | ICD-10-CM | POA: Diagnosis not present

## 2017-09-28 DIAGNOSIS — I10 Essential (primary) hypertension: Secondary | ICD-10-CM | POA: Diagnosis not present

## 2017-09-28 DIAGNOSIS — E1140 Type 2 diabetes mellitus with diabetic neuropathy, unspecified: Secondary | ICD-10-CM | POA: Diagnosis not present

## 2017-09-28 DIAGNOSIS — Z1389 Encounter for screening for other disorder: Secondary | ICD-10-CM | POA: Diagnosis not present

## 2017-09-28 DIAGNOSIS — E7849 Other hyperlipidemia: Secondary | ICD-10-CM | POA: Diagnosis not present

## 2017-09-28 DIAGNOSIS — N183 Chronic kidney disease, stage 3 (moderate): Secondary | ICD-10-CM | POA: Diagnosis not present

## 2017-09-29 DIAGNOSIS — L570 Actinic keratosis: Secondary | ICD-10-CM | POA: Diagnosis not present

## 2017-09-29 DIAGNOSIS — Z08 Encounter for follow-up examination after completed treatment for malignant neoplasm: Secondary | ICD-10-CM | POA: Diagnosis not present

## 2017-09-29 DIAGNOSIS — Z85828 Personal history of other malignant neoplasm of skin: Secondary | ICD-10-CM | POA: Diagnosis not present

## 2017-09-30 ENCOUNTER — Encounter: Payer: Self-pay | Admitting: Nurse Practitioner

## 2017-09-30 DIAGNOSIS — Z1212 Encounter for screening for malignant neoplasm of rectum: Secondary | ICD-10-CM | POA: Diagnosis not present

## 2017-09-30 LAB — IFOBT (OCCULT BLOOD): IFOBT: NEGATIVE

## 2017-12-14 DIAGNOSIS — H43393 Other vitreous opacities, bilateral: Secondary | ICD-10-CM | POA: Diagnosis not present

## 2017-12-14 DIAGNOSIS — H16223 Keratoconjunctivitis sicca, not specified as Sjogren's, bilateral: Secondary | ICD-10-CM | POA: Diagnosis not present

## 2017-12-14 DIAGNOSIS — H25043 Posterior subcapsular polar age-related cataract, bilateral: Secondary | ICD-10-CM | POA: Diagnosis not present

## 2017-12-14 DIAGNOSIS — H5203 Hypermetropia, bilateral: Secondary | ICD-10-CM | POA: Diagnosis not present

## 2017-12-14 DIAGNOSIS — H2513 Age-related nuclear cataract, bilateral: Secondary | ICD-10-CM | POA: Diagnosis not present

## 2017-12-14 DIAGNOSIS — H52203 Unspecified astigmatism, bilateral: Secondary | ICD-10-CM | POA: Diagnosis not present

## 2017-12-14 DIAGNOSIS — H524 Presbyopia: Secondary | ICD-10-CM | POA: Diagnosis not present

## 2017-12-14 LAB — HM DIABETES EYE EXAM

## 2018-02-02 DIAGNOSIS — J45998 Other asthma: Secondary | ICD-10-CM | POA: Diagnosis not present

## 2018-02-02 DIAGNOSIS — E1140 Type 2 diabetes mellitus with diabetic neuropathy, unspecified: Secondary | ICD-10-CM | POA: Diagnosis not present

## 2018-02-02 DIAGNOSIS — Z6837 Body mass index (BMI) 37.0-37.9, adult: Secondary | ICD-10-CM | POA: Diagnosis not present

## 2018-02-02 DIAGNOSIS — E7849 Other hyperlipidemia: Secondary | ICD-10-CM | POA: Diagnosis not present

## 2018-02-02 DIAGNOSIS — J302 Other seasonal allergic rhinitis: Secondary | ICD-10-CM | POA: Diagnosis not present

## 2018-02-02 DIAGNOSIS — N183 Chronic kidney disease, stage 3 (moderate): Secondary | ICD-10-CM | POA: Diagnosis not present

## 2018-04-17 DIAGNOSIS — Z23 Encounter for immunization: Secondary | ICD-10-CM | POA: Diagnosis not present

## 2018-05-09 ENCOUNTER — Ambulatory Visit (INDEPENDENT_AMBULATORY_CARE_PROVIDER_SITE_OTHER): Payer: Medicare Other | Admitting: Nurse Practitioner

## 2018-05-09 ENCOUNTER — Encounter: Payer: Self-pay | Admitting: Nurse Practitioner

## 2018-05-09 ENCOUNTER — Ambulatory Visit (INDEPENDENT_AMBULATORY_CARE_PROVIDER_SITE_OTHER): Payer: Medicare Other

## 2018-05-09 VITALS — BP 130/76 | HR 72 | Temp 97.9°F | Ht 67.5 in | Wt 245.0 lb

## 2018-05-09 DIAGNOSIS — R5383 Other fatigue: Secondary | ICD-10-CM | POA: Diagnosis not present

## 2018-05-09 DIAGNOSIS — R05 Cough: Secondary | ICD-10-CM

## 2018-05-09 DIAGNOSIS — J0140 Acute pansinusitis, unspecified: Secondary | ICD-10-CM

## 2018-05-09 DIAGNOSIS — R5381 Other malaise: Secondary | ICD-10-CM | POA: Diagnosis not present

## 2018-05-09 DIAGNOSIS — R059 Cough, unspecified: Secondary | ICD-10-CM

## 2018-05-09 DIAGNOSIS — R0602 Shortness of breath: Secondary | ICD-10-CM | POA: Diagnosis not present

## 2018-05-09 MED ORDER — GUAIFENESIN-DM 100-10 MG/5ML PO SYRP: 5.0000 mL | ORAL_SOLUTION | Freq: Three times a day (TID) | ORAL | 0 refills | Status: DC | PRN

## 2018-05-09 NOTE — Patient Instructions (Addendum)
Continue use of albuterol every 6hrs as needed for chest tightness and cough. Maintain upcoming appt.  Negative for pneumonia. Oral abx sent to treat sinusitis

## 2018-05-09 NOTE — Progress Notes (Signed)
Subjective:  Patient ID: Andres Taylor, male    DOB: 12/31/1945  Age: 72 y.o. MRN: [id number]  CC: Nasal Congestion (patient is complaining of chest congetion,coughing,fatigue. going on almost 3 weeks. took otc. )   Cough  This is a new problem. The current episode started 1 to 4 weeks ago. The problem has been waxing and waning. The cough is productive of sputum. Associated symptoms include nasal congestion, postnasal drip, rhinorrhea and shortness of breath. Pertinent negatives include no chills, fever, sore throat, sweats, weight loss or wheezing. Risk factors for lung disease include smoking/tobacco exposure (occassional cigars). He has tried a beta-agonist inhaler for the symptoms. The treatment provided moderate relief.  Congestion x 3weeks, onset after flu vaccine 3weeks ago Productive cough, fatigue  Does not check glucose at home. Last hgb A1c 5.9 Last seen OV 63months ago.  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  Reviewed past Medical, Social and Family history today.  Outpatient Medications Prior to Visit  Medication Sig Dispense Refill  . albuterol (PROVENTIL) (2.5 MG/3ML) 0.083% nebulizer solution INL 1 VIAL IN NEBULIZER Q 4 TO 6 H PRF WHEEZE OR SOB  0  . allopurinol (ZYLOPRIM) 100 MG tablet TK 1 T PO  D  2  . aspirin EC 81 MG tablet Take 81 mg by mouth daily.    [name] BREO ELLIPTA 200-25 MCG/INH AEPB INL 1 PUFF PO D  2  . cetirizine (ZYRTEC) 10 MG tablet Take 10 mg by mouth daily.    . colchicine 0.6 MG tablet Take 0.6 mg by mouth 2 (two) times daily as needed (for gout). PRN for Gout    . DICLOFENAC SODIUM PO Take 75 mg by mouth 2 (two) times daily.    [name] Fexofenadine HCl (MUCINEX ALLERGY PO) Take by mouth. otc    . furosemide (LASIX) 40 MG tablet Take 40 mg by mouth as needed for fluid.     . hydrochlorothiazide (MICROZIDE) 12.5 MG capsule Take 12.5 mg by mouth daily.  4  . lisinopril (PRINIVIL,ZESTRIL) 20 MG tablet Take 20 mg by mouth  daily.     . magnesium oxide (MAG-OX) 400 MG tablet Take 400 mg by mouth daily.    . metFORMIN (GLUCOPHAGE) 1000 MG tablet TK 1 T PO BID  1  . methocarbamol (ROBAXIN) 500 MG tablet Take 500 mg by mouth 4 (four) times daily.    . Multiple Vitamin (MULTIVITAMIN) tablet Take 1 tablet by mouth daily.    [name] PROAIR RESPICLICK 462 (90 Base) MCG/ACT AEPB INHALE 2 PUFFS Q 4-6 H PRF COUGH/ WHEEZE  2  . ciprofloxacin (CIPRO) 500 MG tablet Take 1 tablet (500 mg total) by mouth 2 (two) times daily. (Patient not taking: Reported on 05/09/2018) 28 tablet 0  . diclofenac (VOLTAREN) 50 MG EC tablet Take 50 mg by mouth 2 (two) times daily.     . fluticasone (FLONASE) 50 MCG/ACT nasal spray INSTILL 2 SPRAYS TO EACH NOSTRIL D DURING ALLERGY/COLD/FLU SEASON  5  . gabapentin (NEURONTIN) 300 MG capsule Take 300 mg by mouth 2 (two) times daily.     [name] JANUMET 50-1000 MG per tablet Take 1 tablet by mouth 2 (two) times daily.     . minocycline (MINOCIN,DYNACIN) 100 MG capsule Take 1 capsule (100 mg total) by mouth 2 (two) times daily. (Patient not taking: Reported on 05/09/2018) 20 capsule 0  . mometasone (NASONEX) 50 MCG/ACT nasal spray Place 2 sprays into the nose daily as needed (for  allergies).     . ondansetron (ZOFRAN-ODT) 4 MG disintegrating tablet Take 1 tablet (4 mg total) by mouth every 8 (eight) hours as needed for nausea or vomiting. (Patient not taking: Reported on 05/09/2018) 20 tablet 0   No facility-administered medications prior to visit.     ROS See HPI  Objective:  BP 130/76   Pulse 72   Temp 97.9 F (36.6 C) (Oral)   Ht 5' 7.5" (1.715 m)   Wt 245 lb (111.1 kg)   SpO2 98%   BMI 37.81 kg/m   BP Readings from Last 3 Encounters:  05/09/18 130/76  09/06/14 161/81  12/03/13 (!) 104/51    Wt Readings from Last 3 Encounters:  05/09/18 245 lb (111.1 kg)  12/03/13 240 lb (108.9 kg)  09/19/13 259 lb (117.5 kg)    Physical Exam  Constitutional: He is oriented to person, place, and time. He  appears well-developed and well-nourished.  Cardiovascular: Normal rate, regular rhythm and normal heart sounds.  Pulmonary/Chest: Effort normal. No stridor. He has decreased breath sounds. He has no wheezes.  Diminished lung sounds in RML and RLL. Clear lung sounds on left side.  Musculoskeletal: He exhibits no edema.  Neurological: He is alert and oriented to person, place, and time.  Vitals reviewed.   Lab Results  Component Value Date   WBC 12.1 (H) 12/03/2013   HGB 13.4 12/03/2013   HCT 39.5 12/03/2013   PLT 150 12/03/2013   GLUCOSE 127 (H) 12/03/2013   ALT 29 12/03/2013   AST 26 12/03/2013   NA 138 12/03/2013   K 4.2 12/03/2013   CL 101 12/03/2013   CREATININE 0.80 12/03/2013   BUN 16 12/03/2013   CO2 23 12/03/2013   INR 1.07 08/28/2013    Dg Chest 2 View  Result Date: 09/06/2014 CLINICAL DATA:  Sick for 2 weeks.  History of right lung surgery. EXAM: CHEST  2 VIEW COMPARISON:  12/03/2013 FINDINGS: Chronic volume loss in the right hemithorax related to prior chest surgery. The right hemidiaphragm is elevated. The lungs are clear without airspace disease. Heart and mediastinum are within normal limits. The trachea is midline. Negative for pleural effusions. IMPRESSION: No acute chest findings. Electronically Signed   By: Markus Daft M.D.   On: 09/06/2014 20:32    Assessment & Plan:   Andres Taylor was seen today for nasal congestion.  Diagnoses and all orders for this visit:  Cough -     DG Chest 2 View -     guaiFENesin-dextromethorphan (ROBITUSSIN DM) 100-10 MG/5ML syrup; Take 5 mLs by mouth every 8 (eight) hours as needed for cough.  Malaise and fatigue -     DG Chest 2 View -     azithromycin (ZITHROMAX Z-PAK) 250 MG tablet; Take 1 tablet (250 mg total) by mouth daily. Take 2tabs on first day, then 1tab once a day till complete  Acute non-recurrent pansinusitis -     azithromycin (ZITHROMAX Z-PAK) 250 MG tablet; Take 1 tablet (250 mg total) by mouth daily. Take 2tabs  on first day, then 1tab once a day till complete   I am having Andres Taylor. Hoaglin start on guaiFENesin-dextromethorphan and azithromycin. I am also having him maintain his JANUMET, lisinopril, furosemide, gabapentin, mometasone, colchicine, cetirizine, aspirin EC, diclofenac, ciprofloxacin, ondansetron, minocycline, metFORMIN, hydrochlorothiazide, allopurinol, fluticasone, DICLOFENAC SODIUM PO, methocarbamol, multivitamin, magnesium oxide, Fexofenadine HCl (MUCINEX ALLERGY PO), BREO ELLIPTA, PROAIR RESPICLICK, and albuterol.  Meds ordered this encounter  Medications  . guaiFENesin-dextromethorphan (ROBITUSSIN DM) 100-10 MG/5ML  syrup    Sig: Take 5 mLs by mouth every 8 (eight) hours as needed for cough.    Dispense:  118 mL    Refill:  0    Order Specific Question:   Supervising Provider    Answer:   Lucille Passy [[id number]]  . azithromycin (ZITHROMAX Z-PAK) 250 MG tablet    Sig: Take 1 tablet (250 mg total) by mouth daily. Take 2tabs on first day, then 1tab once a day till complete    Dispense:  6 tablet    Refill:  0    Order Specific Question:   Supervising Provider    Answer:   Lucille Passy [[id number]]    Follow-up: No follow-ups on file.  Wilfred Lacy, NP

## 2018-05-10 MED ORDER — AZITHROMYCIN 250 MG PO TABS: 250.0000 mg | ORAL_TABLET | Freq: Every day | ORAL | 0 refills | Status: DC

## 2018-05-17 ENCOUNTER — Other Ambulatory Visit: Payer: Self-pay | Admitting: Nurse Practitioner

## 2018-05-17 ENCOUNTER — Encounter: Payer: Self-pay | Admitting: Nurse Practitioner

## 2018-05-17 DIAGNOSIS — N183 Chronic kidney disease, stage 3 (moderate): Secondary | ICD-10-CM

## 2018-05-17 DIAGNOSIS — N182 Chronic kidney disease, stage 2 (mild): Secondary | ICD-10-CM | POA: Insufficient documentation

## 2018-05-17 DIAGNOSIS — E1140 Type 2 diabetes mellitus with diabetic neuropathy, unspecified: Secondary | ICD-10-CM | POA: Insufficient documentation

## 2018-05-17 DIAGNOSIS — J984 Other disorders of lung: Secondary | ICD-10-CM | POA: Insufficient documentation

## 2018-05-17 DIAGNOSIS — M109 Gout, unspecified: Secondary | ICD-10-CM | POA: Insufficient documentation

## 2018-05-17 DIAGNOSIS — E119 Type 2 diabetes mellitus without complications: Secondary | ICD-10-CM | POA: Insufficient documentation

## 2018-05-17 DIAGNOSIS — G4733 Obstructive sleep apnea (adult) (pediatric): Secondary | ICD-10-CM | POA: Insufficient documentation

## 2018-05-17 DIAGNOSIS — E790 Hyperuricemia without signs of inflammatory arthritis and tophaceous disease: Secondary | ICD-10-CM | POA: Insufficient documentation

## 2018-05-17 DIAGNOSIS — M5412 Radiculopathy, cervical region: Secondary | ICD-10-CM | POA: Insufficient documentation

## 2018-06-06 ENCOUNTER — Ambulatory Visit (INDEPENDENT_AMBULATORY_CARE_PROVIDER_SITE_OTHER): Payer: Medicare Other | Admitting: Nurse Practitioner

## 2018-06-06 ENCOUNTER — Encounter: Payer: Self-pay | Admitting: Nurse Practitioner

## 2018-06-06 VITALS — BP 120/70 | HR 73 | Temp 97.6°F | Ht 67.5 in | Wt 242.0 lb

## 2018-06-06 DIAGNOSIS — M1A9XX0 Chronic gout, unspecified, without tophus (tophi): Secondary | ICD-10-CM

## 2018-06-06 DIAGNOSIS — E782 Mixed hyperlipidemia: Secondary | ICD-10-CM | POA: Diagnosis not present

## 2018-06-06 DIAGNOSIS — I10 Essential (primary) hypertension: Secondary | ICD-10-CM

## 2018-06-06 DIAGNOSIS — M5416 Radiculopathy, lumbar region: Secondary | ICD-10-CM

## 2018-06-06 DIAGNOSIS — N183 Chronic kidney disease, stage 3 unspecified: Secondary | ICD-10-CM

## 2018-06-06 DIAGNOSIS — Z86010 Personal history of colonic polyps: Secondary | ICD-10-CM | POA: Diagnosis not present

## 2018-06-06 DIAGNOSIS — Z1159 Encounter for screening for other viral diseases: Secondary | ICD-10-CM | POA: Diagnosis not present

## 2018-06-06 DIAGNOSIS — Z1211 Encounter for screening for malignant neoplasm of colon: Secondary | ICD-10-CM | POA: Diagnosis not present

## 2018-06-06 DIAGNOSIS — E1142 Type 2 diabetes mellitus with diabetic polyneuropathy: Secondary | ICD-10-CM | POA: Diagnosis not present

## 2018-06-06 DIAGNOSIS — H906 Mixed conductive and sensorineural hearing loss, bilateral: Secondary | ICD-10-CM | POA: Insufficient documentation

## 2018-06-06 DIAGNOSIS — E119 Type 2 diabetes mellitus without complications: Secondary | ICD-10-CM

## 2018-06-06 LAB — LIPID PANEL
Cholesterol: 188 mg/dL (ref 0–200)
HDL: 36.3 mg/dL — ABNORMAL LOW (ref >39.00)
NONHDL: 151.44
Total CHOL/HDL Ratio: 5
Triglycerides: 222 mg/dL — ABNORMAL HIGH (ref 0.0–149.0)
VLDL: 44.4 mg/dL — ABNORMAL HIGH (ref 0.0–40.0)

## 2018-06-06 LAB — COMPREHENSIVE METABOLIC PANEL
ALBUMIN: 4.6 g/dL (ref 3.5–5.2)
ALK PHOS: 57 U/L (ref 39–117)
ALT: 26 U/L (ref 0–53)
AST: 19 U/L (ref 0–37)
BILIRUBIN TOTAL: 0.5 mg/dL (ref 0.2–1.2)
BUN: 35 mg/dL — AB (ref 6–23)
CO2: 29 mEq/L (ref 19–32)
CREATININE: 1.44 mg/dL (ref 0.40–1.50)
Calcium: 9.7 mg/dL (ref 8.4–10.5)
Chloride: 101 mEq/L (ref 96–112)
GFR: 51 mL/min — ABNORMAL LOW (ref >60.00)
GLUCOSE: 109 mg/dL — AB (ref 70–99)
POTASSIUM: 5.4 meq/L — AB (ref 3.5–5.1)
SODIUM: 139 meq/L (ref 135–145)
TOTAL PROTEIN: 7.1 g/dL (ref 6.0–8.3)

## 2018-06-06 LAB — CBC
HCT: 41.4 % (ref 39.0–52.0)
Hemoglobin: 13.8 g/dL (ref 13.0–17.0)
MCHC: 33.3 g/dL (ref 30.0–36.0)
MCV: 89.2 fl (ref 78.0–100.0)
Platelets: 226 10*3/uL (ref 150.0–400.0)
RBC: 4.64 Mil/uL (ref 4.22–5.81)
RDW: 13.8 % (ref 11.5–15.5)
WBC: 6.4 10*3/uL (ref 4.0–10.5)

## 2018-06-06 LAB — HEMOGLOBIN A1C: HEMOGLOBIN A1C: 6.4 % (ref 4.6–6.5)

## 2018-06-06 LAB — URIC ACID: URIC ACID, SERUM: 6.8 mg/dL (ref 4.0–7.8)

## 2018-06-06 LAB — LDL CHOLESTEROL, DIRECT: Direct LDL: 131 mg/dL

## 2018-06-06 LAB — MICROALBUMIN / CREATININE URINE RATIO
Creatinine,U: 131.9 mg/dL
MICROALB/CREAT RATIO: 0.7 mg/g (ref 0.0–30.0)
Microalb, Ur: 1 mg/dL (ref 0.0–1.9)

## 2018-06-06 NOTE — Progress Notes (Signed)
Subjective:  Patient ID: Andres Taylor, male    DOB: 1945-10-04  Age: 72 y.o. MRN: [id number]  CC: Establish Care (est care/)   HPI   DM: Controlled with metformin. eye exam schedule 06/20/2018, seen every 1months due to hx of glaucoma. (Dr. Trinna Post with [facility] ophthalmology).  Gout: Controlled with allopurinol. Has not needed colchicine No exacerbation in last 65months.  HTN: Controlled with lisinopril and HCTZ  Chronic right leg paresthesia and numbness. Onset after lumbar surgery. Has chronic back pain. Use of gabapentin. Denies any fall in last 1year.  Reviewed past Medical, Social and Family history today.  Outpatient Medications Prior to Visit  Medication Sig Dispense Refill  . albuterol (PROVENTIL) (2.5 MG/3ML) 0.083% nebulizer solution INL 1 VIAL IN NEBULIZER Q 4 TO 6 H PRF WHEEZE OR SOB  0  . allopurinol (ZYLOPRIM) 100 MG tablet TK 1 T PO  D  2  . aspirin EC 81 MG tablet Take 81 mg by mouth daily.    [name] atorvastatin (LIPITOR) 10 MG tablet Take 1 tablet (10 mg total) by mouth daily. 90 tablet 0  . BREO ELLIPTA 200-25 MCG/INH AEPB INL 1 PUFF PO D  2  . colchicine 0.6 MG tablet Take 0.6 mg by mouth 2 (two) times daily as needed (for gout). PRN for Gout    . diclofenac (VOLTAREN) 50 MG EC tablet Take 50 mg by mouth 2 (two) times daily.     [name] Fexofenadine HCl (MUCINEX ALLERGY PO) Take by mouth. otc    . fluticasone (FLONASE) 50 MCG/ACT nasal spray INSTILL 2 SPRAYS TO EACH NOSTRIL D DURING ALLERGY/COLD/FLU SEASON  5  . hydrochlorothiazide (MICROZIDE) 12.5 MG capsule Take 12.5 mg by mouth daily.  4  . lisinopril (PRINIVIL,ZESTRIL) 20 MG tablet Take 20 mg by mouth daily.     . metFORMIN (GLUCOPHAGE) 1000 MG tablet TK 1 T PO BID  1  . methocarbamol (ROBAXIN) 500 MG tablet Take 500 mg by mouth 4 (four) times daily.    . montelukast (SINGULAIR) 10 MG tablet Take 1 tablet (10 mg total) by mouth at bedtime. 30 tablet 0  . Multiple Vitamin (MULTIVITAMIN) tablet Take 1  tablet by mouth daily.    [name] PROAIR RESPICLICK 426 (90 Base) MCG/ACT AEPB INHALE 2 PUFFS Q 4-6 H PRF COUGH/ WHEEZE  2  . gabapentin (NEURONTIN) 300 MG capsule Take 300 mg by mouth 2 (two) times daily.      No facility-administered medications prior to visit.     ROS See HPI  Objective:  BP 120/70   Pulse 73   Temp 97.6 F (36.4 C) (Oral)   Ht 5' 7.5" (1.715 m)   Wt 242 lb (109.8 kg)   SpO2 96%   BMI 37.34 kg/m   BP Readings from Last 3 Encounters:  06/06/18 120/70  05/09/18 130/76  09/06/14 161/81    Wt Readings from Last 3 Encounters:  06/06/18 242 lb (109.8 kg)  05/09/18 245 lb (111.1 kg)  12/03/13 240 lb (108.9 kg)    Physical Exam  Constitutional: He is oriented to person, place, and time. He appears well-developed and well-nourished.  Neck: Normal range of motion. Neck supple. No thyromegaly present.  Cardiovascular: Normal rate, regular rhythm, normal heart sounds and intact distal pulses.  Pulmonary/Chest: Effort normal and breath sounds normal.  Abdominal: Soft. Bowel sounds are normal.  Musculoskeletal: He exhibits edema.  Neurological: He is alert and oriented to person, place, and time. He has normal strength.  Skin:  No rash noted. No erythema.  Abnormal diabetic foot exam: absent sensation in right foot and ankle.  Psychiatric: He has a normal mood and affect. His behavior is normal. Thought content normal.  Vitals reviewed.  Lab Results  Component Value Date   WBC 6.4 06/06/2018   HGB 13.8 06/06/2018   HCT 41.4 06/06/2018   PLT 226.0 06/06/2018   GLUCOSE 109 (H) 06/06/2018   CHOL 188 06/06/2018   TRIG 222.0 (H) 06/06/2018   HDL 36.30 (L) 06/06/2018   LDLDIRECT 131.0 06/06/2018   ALT 26 06/06/2018   AST 19 06/06/2018   NA 139 06/06/2018   K 5.4 (H) 06/06/2018   CL 101 06/06/2018   CREATININE 1.44 06/06/2018   BUN 35 (H) 06/06/2018   CO2 29 06/06/2018   INR 1.07 08/28/2013   HGBA1C 6.4 06/06/2018   MICROALBUR 1.0 06/06/2018   Dg Chest 2  View  Result Date: 09/06/2014 CLINICAL DATA:  Sick for 2 weeks.  History of right lung surgery. EXAM: CHEST  2 VIEW COMPARISON:  12/03/2013 FINDINGS: Chronic volume loss in the right hemithorax related to prior chest surgery. The right hemidiaphragm is elevated. The lungs are clear without airspace disease. Heart and mediastinum are within normal limits. The trachea is midline. Negative for pleural effusions. IMPRESSION: No acute chest findings. Electronically Signed   By: Markus Daft M.D.   On: 09/06/2014 20:32    Assessment & Plan:   Cali was seen today for establish care.  Diagnoses and all orders for this visit:  HTN (hypertension), benign -     CBC -     Comprehensive metabolic panel  Diabetic polyneuropathy associated with type 2 diabetes mellitus ([place]) -     Comprehensive metabolic panel -     Hemoglobin A1c  Type 2 diabetes mellitus without complication, without long-term current use of insulin (HCC) -     Hemoglobin A1c -     Microalbumin / creatinine urine ratio  CKD (chronic kidney disease) stage 3, GFR 30-59 ml/min (HCC) -     Basic metabolic panel; Future  Mixed hyperlipidemia -     Lipid Profile  Chronic gout without tophus, unspecified cause, unspecified site -     Uric acid  Colon cancer screening -     Ambulatory referral to Gastroenterology  Hx of colonic polyp -     Ambulatory referral to Gastroenterology  Encounter for hepatitis C screening test for low risk patient -     Hepatitis C Antibody  Other orders -     LDL cholesterol, direct   I am having Andres Section. Taylor maintain his lisinopril, gabapentin, colchicine, aspirin EC, diclofenac, metFORMIN, hydrochlorothiazide, allopurinol, fluticasone, methocarbamol, multivitamin, Fexofenadine HCl (MUCINEX ALLERGY PO), BREO ELLIPTA, PROAIR RESPICLICK, albuterol, atorvastatin, and montelukast.  No orders of the defined types were placed in this encounter.   Problem List Items Addressed This Visit       Cardiovascular and Mediastinum   HTN (hypertension), benign - Primary (Chronic)   Relevant Orders   CBC (Completed)   Comprehensive metabolic panel (Completed)     Endocrine   Diabetic neuropathy associated with type 2 diabetes mellitus ([place])   Relevant Orders   Comprehensive metabolic panel (Completed)   Hemoglobin A1c (Completed)   DM (diabetes mellitus), type 2 (HCC)   Relevant Orders   Hemoglobin A1c (Completed)   Microalbumin / creatinine urine ratio (Completed)     Genitourinary   CKD (chronic kidney disease) stage 3, GFR 30-59 ml/min (HCC)  Relevant Orders   Basic metabolic panel     Other   Gout   Relevant Orders   Uric acid (Completed)   Hyperlipidemia - well-controlled (Chronic)   Relevant Orders   Lipid Profile (Completed)    Other Visit Diagnoses    Colon cancer screening       Relevant Orders   Ambulatory referral to Gastroenterology   Hx of colonic polyp       Relevant Orders   Ambulatory referral to Gastroenterology   Encounter for hepatitis C screening test for low risk patient       Relevant Orders   Hepatitis C Antibody (Completed)       Follow-up: Return in about 3 months (around 09/06/2018) for DM and HTN, hyperlipidemia (fasting).  Wilfred Lacy, NP

## 2018-06-06 NOTE — Patient Instructions (Addendum)
Negative hep.c Normal CBC Normal urine microalbumin and HgbA1c of 6.4= controlled DM. Continue metformin. Normal uric acid. Continue allopurinol. CMP indicates stage 3 CKD (stable) with mild elevation in potassium. Maintain adequate oral hydration and return to lab for repeat bmp in 1week. Lipid panel indicates high triglyceride and low HDL. Continue lipitor, and maintain DASH diet. Make 96months f/up appt with me. (fasting)  You will be contacted to schedule appt with GI.  Please let me know which medications need to be refilled.

## 2018-06-07 ENCOUNTER — Encounter: Payer: Self-pay | Admitting: Nurse Practitioner

## 2018-06-07 DIAGNOSIS — M4802 Spinal stenosis, cervical region: Secondary | ICD-10-CM | POA: Insufficient documentation

## 2018-06-07 DIAGNOSIS — M5416 Radiculopathy, lumbar region: Secondary | ICD-10-CM | POA: Insufficient documentation

## 2018-06-07 DIAGNOSIS — M47812 Spondylosis without myelopathy or radiculopathy, cervical region: Secondary | ICD-10-CM | POA: Insufficient documentation

## 2018-06-07 LAB — HEPATITIS C ANTIBODY
HEP C AB: NONREACTIVE
SIGNAL TO CUT-OFF: 0.01 (ref <1.00)

## 2018-06-07 NOTE — Assessment & Plan Note (Signed)
Chronic per patient. Onset after lumbar surgery 1980.

## 2018-06-16 ENCOUNTER — Other Ambulatory Visit: Payer: Self-pay | Admitting: Nurse Practitioner

## 2018-06-16 MED ORDER — ALLOPURINOL 100 MG PO TABS: ORAL_TABLET | ORAL | 1 refills | Status: DC

## 2018-06-20 DIAGNOSIS — H52203 Unspecified astigmatism, bilateral: Secondary | ICD-10-CM | POA: Diagnosis not present

## 2018-06-20 DIAGNOSIS — H25043 Posterior subcapsular polar age-related cataract, bilateral: Secondary | ICD-10-CM | POA: Diagnosis not present

## 2018-06-20 DIAGNOSIS — H2513 Age-related nuclear cataract, bilateral: Secondary | ICD-10-CM | POA: Diagnosis not present

## 2018-06-20 DIAGNOSIS — H04123 Dry eye syndrome of bilateral lacrimal glands: Secondary | ICD-10-CM | POA: Diagnosis not present

## 2018-06-20 DIAGNOSIS — H5203 Hypermetropia, bilateral: Secondary | ICD-10-CM | POA: Diagnosis not present

## 2018-06-20 DIAGNOSIS — H524 Presbyopia: Secondary | ICD-10-CM | POA: Diagnosis not present

## 2018-06-20 DIAGNOSIS — H16223 Keratoconjunctivitis sicca, not specified as Sjogren's, bilateral: Secondary | ICD-10-CM | POA: Diagnosis not present

## 2018-07-25 ENCOUNTER — Other Ambulatory Visit: Payer: Self-pay | Admitting: Nurse Practitioner

## 2018-07-25 MED ORDER — METHOCARBAMOL 500 MG PO TABS: 500.0000 mg | ORAL_TABLET | Freq: Three times a day (TID) | ORAL | 2 refills | Status: DC | PRN

## 2018-07-25 MED ORDER — BREO ELLIPTA 200-25 MCG/INH IN AEPB: 1.0000 | INHALATION_SPRAY | Freq: Every day | RESPIRATORY_TRACT | 5 refills | Status: DC

## 2018-07-25 NOTE — Telephone Encounter (Signed)
Requested medication (s) are due for refill today: yes  Requested medication (s) are on the active medication list: yes  Last refill:  Last filled by historical provider  Future visit scheduled: yes  Notes to clinic:  Medication previously filled by historical provider    Requested Prescriptions  Pending Prescriptions Disp Refills   methocarbamol (ROBAXIN) 500 MG tablet      Sig: Take 1 tablet (500 mg total) by mouth 4 (four) times daily.     Not Delegated - Analgesics:  Muscle Relaxants Failed - 07/25/2018  1:28 PM      Failed - This refill cannot be delegated      Passed - Valid encounter within last 6 months    Recent Outpatient Visits          1 month ago HTN (hypertension), benign   [facility] [place], Charlene Brooke, NP   2 months ago Cough   [facility] [place], Charlene Brooke, NP      Future Appointments            In 1 month [place], Charlene Brooke, NP [facility] [place], [place] 200-25 MCG/INH AEPB  2     Pulmonology:  Combination Products Passed - 07/25/2018  1:28 PM      Passed - Valid encounter within last 12 months    Recent Outpatient Visits          1 month ago HTN (hypertension), benign   [facility] [place], Charlene Brooke, NP   2 months ago Cough   [facility] [place], Charlene Brooke, NP      Future Appointments            In 1 month [place], Charlene Brooke, NP [facility], [place]

## 2018-07-25 NOTE — Telephone Encounter (Signed)
Please advise we never sent in [place].

## 2018-07-25 NOTE — Telephone Encounter (Addendum)
Copied from [place] [id number]. Topic: Quick Communication - See Telephone Encounter >> Jul 25, 2018 12:41 PM Ivar Drape wrote: CRM for notification. See Telephone encounter for: 07/25/18. Patient would like the following medications refilled and sent to his preferred [facility], [address] AT [address]  1) methocarbamol (ROBAXIN) 500 MG tablet  2) BREO ELLIPTA 200-25 MCG/INH AEPB   Patient is completely out of medication.

## 2018-08-01 ENCOUNTER — Other Ambulatory Visit: Payer: Self-pay | Admitting: Nurse Practitioner

## 2018-08-01 NOTE — Telephone Encounter (Signed)
Copied from [place] [id number]. Topic: Quick Communication - Rx Refill/Question >> Aug 01, 2018 11:46 AM Virl Axe D wrote: Medication: diclofenac (VOLTAREN) 50 MG EC tablet  Has the patient contacted their pharmacy? Yes.   (Agent: If no, request that the patient contact the pharmacy for the refill.) (Agent: If yes, when and what did the pharmacy advise?)  Preferred Pharmacy (with phone number or street name): [id number] - [place], [address] AT [place] [phone] (Phone) [phone] (Fax)  Agent: Please be advised that RX refills may take up to 3 business days. We ask that you follow-up with your pharmacy.

## 2018-08-01 NOTE — Telephone Encounter (Signed)
Requested medication (s) are due for refill today: yes  Requested medication (s) are on the active medication list: yes   Last refill:  Previously filled by historical provider  Future visit scheduled: yes  Notes to clinic:  Unable to refill per protocol. Medication last filled by historical provider    Requested Prescriptions  Pending Prescriptions Disp Refills   diclofenac (VOLTAREN) 50 MG EC tablet      Sig: Take 1 tablet (50 mg total) by mouth 2 (two) times daily.     Analgesics:  NSAIDS Passed - 08/01/2018  2:18 PM      Passed - Cr in normal range and within 360 days    Creat  Date Value Ref Range Status  04/18/2013 0.93 0.50 - 1.35 mg/dL Final   Creatinine, Ser  Date Value Ref Range Status  06/06/2018 1.44 0.40 - 1.50 mg/dL Final         Passed - HGB in normal range and within 360 days    Hemoglobin  Date Value Ref Range Status  06/06/2018 13.8 13.0 - 17.0 g/dL Final         Passed - Patient is not pregnant      Passed - Valid encounter within last 12 months    Recent Outpatient Visits          1 month ago HTN (hypertension), benign   [facility] [place], [place], NP   2 months ago Cough   [facility] [place], Charlene Brooke, NP      Future Appointments            In 1 month [place], Charlene Brooke, NP [facility], [place]

## 2018-08-01 NOTE — Telephone Encounter (Signed)
Charlotte please advise. We never send in this rx before. Pt has follow up with you on 09/06/2018.

## 2018-08-02 MED ORDER — DICLOFENAC SODIUM 50 MG PO TBEC: 50.0000 mg | DELAYED_RELEASE_TABLET | Freq: Two times a day (BID) | ORAL | 2 refills | Status: DC | PRN

## 2018-08-05 ENCOUNTER — Telehealth: Payer: Self-pay | Admitting: Gastroenterology

## 2018-08-05 NOTE — Telephone Encounter (Signed)
I reviewed a packet of information on my desk as "doc of the day".  Colonoscopy February 2012, Dr. Dorrene German in [place].  Indication colon cancer screening.  Findings sigmoid diverticulosis.  3 subcentimeter rectosigmoid polyps.  Small internal hemorrhoids.  Pathology report showed the polyps were all hyperplastic.  He needs recall colonoscopy February 2022

## 2018-08-17 NOTE — Telephone Encounter (Signed)
Called patient and he is aware he is due for colon in Feb. 2022

## 2018-09-01 ENCOUNTER — Encounter: Payer: Self-pay | Admitting: Nurse Practitioner

## 2018-09-06 ENCOUNTER — Encounter: Payer: Self-pay | Admitting: Nurse Practitioner

## 2018-09-06 ENCOUNTER — Ambulatory Visit (INDEPENDENT_AMBULATORY_CARE_PROVIDER_SITE_OTHER): Payer: Medicare Other | Admitting: Nurse Practitioner

## 2018-09-06 VITALS — BP 150/80 | HR 75 | Temp 97.6°F | Ht 67.5 in | Wt 242.2 lb

## 2018-09-06 DIAGNOSIS — J984 Other disorders of lung: Secondary | ICD-10-CM | POA: Diagnosis not present

## 2018-09-06 DIAGNOSIS — N183 Chronic kidney disease, stage 3 unspecified: Secondary | ICD-10-CM

## 2018-09-06 DIAGNOSIS — R0609 Other forms of dyspnea: Secondary | ICD-10-CM

## 2018-09-06 DIAGNOSIS — I272 Pulmonary hypertension, unspecified: Secondary | ICD-10-CM

## 2018-09-06 DIAGNOSIS — R05 Cough: Secondary | ICD-10-CM | POA: Diagnosis not present

## 2018-09-06 DIAGNOSIS — R053 Chronic cough: Secondary | ICD-10-CM

## 2018-09-06 DIAGNOSIS — I10 Essential (primary) hypertension: Secondary | ICD-10-CM | POA: Diagnosis not present

## 2018-09-06 DIAGNOSIS — E782 Mixed hyperlipidemia: Secondary | ICD-10-CM | POA: Diagnosis not present

## 2018-09-06 DIAGNOSIS — E1142 Type 2 diabetes mellitus with diabetic polyneuropathy: Secondary | ICD-10-CM | POA: Diagnosis not present

## 2018-09-06 LAB — BASIC METABOLIC PANEL
BUN: 30 mg/dL — ABNORMAL HIGH (ref 6–23)
CO2: 29 mEq/L (ref 19–32)
Calcium: 9.1 mg/dL (ref 8.4–10.5)
Chloride: 101 mEq/L (ref 96–112)
Creatinine, Ser: 1.31 mg/dL (ref 0.40–1.50)
GFR: 53.48 mL/min — ABNORMAL LOW (ref >60.00)
Glucose, Bld: 108 mg/dL — ABNORMAL HIGH (ref 70–99)
Potassium: 5.3 mEq/L — ABNORMAL HIGH (ref 3.5–5.1)
Sodium: 137 mEq/L (ref 135–145)

## 2018-09-06 LAB — LIPID PANEL
Cholesterol: 184 mg/dL (ref 0–200)
HDL: 36.2 mg/dL — ABNORMAL LOW (ref >39.00)
NonHDL: 147.78
Total CHOL/HDL Ratio: 5
Triglycerides: 235 mg/dL — ABNORMAL HIGH (ref 0.0–149.0)
VLDL: 47 mg/dL — AB (ref 0.0–40.0)

## 2018-09-06 LAB — BRAIN NATRIURETIC PEPTIDE: Pro B Natriuretic peptide (BNP): 14 pg/mL (ref 0.0–100.0)

## 2018-09-06 LAB — LDL CHOLESTEROL, DIRECT: Direct LDL: 132 mg/dL

## 2018-09-06 NOTE — Progress Notes (Signed)
Subjective:  Patient ID: Andres Taylor, male    DOB: Sep 07, 1945  Age: 73 y.o. MRN: [id number]  CC: Follow-up (f/u DM, HTN, Hyperlipidemia-- Fasting/ pt states having congestion/ OTC robutussin)  Cough  This is a chronic problem. The current episode started more than 1 year ago. The problem has been waxing and waning. The problem occurs constantly. The cough is productive of sputum. Associated symptoms include nasal congestion, postnasal drip and shortness of breath. Pertinent negatives include no chest pain, chills, ear congestion, ear pain, fever, headaches, heartburn, hemoptysis, myalgias, rash, rhinorrhea, sore throat, sweats, weight loss or wheezing. Exacerbated by: SOB with exertion. He has tried a beta-agonist inhaler and OTC cough suppressant for the symptoms. The treatment provided no relief. restrictive lung disease and pulmonary HTN  has not been seen by cardiology or pulmonology in several years. FHx of right lung mass resection. Use of breo and albuterol daily.  DM: controlled with metformin. Up to  Date with eye exam, urine microalbumin (normal), and foot exam. Last hgbA1c of 6.4 (05/2018) Bilateral LE neuropathy (controlled with gabapentin)  HTN: Elevated today due to current use of OTC cough medication. Compliant with HCTZ, and lisinopril BP Readings from Last 3 Encounters:  09/06/18 (!) 150/80  06/06/18 120/70  05/09/18 130/76   Reviewed past Medical, Social and Family history today.  Outpatient Medications Prior to Visit  Medication Sig Dispense Refill  . albuterol (PROVENTIL) (2.5 MG/3ML) 0.083% nebulizer solution INL 1 VIAL IN NEBULIZER Q 4 TO 6 H PRF WHEEZE OR SOB  0  . allopurinol (ZYLOPRIM) 100 MG tablet Take 1 tablet by mouth daily 90 tablet 1  . aspirin EC 81 MG tablet Take 81 mg by mouth daily.    [name] BREO ELLIPTA 200-25 MCG/INH AEPB Inhale 1 puff into the lungs daily. 1 each 5  . colchicine 0.6 MG tablet Take 0.6 mg by mouth 2 (two) times daily as  needed (for gout). PRN for Gout    . Fexofenadine HCl (MUCINEX ALLERGY PO) Take by mouth. otc    . fluticasone (FLONASE) 50 MCG/ACT nasal spray INSTILL 2 SPRAYS TO EACH NOSTRIL D DURING ALLERGY/COLD/FLU SEASON  5  . methocarbamol (ROBAXIN) 500 MG tablet Take 1 tablet (500 mg total) by mouth every 8 (eight) hours as needed for muscle spasms. 90 tablet 2  . Multiple Vitamin (MULTIVITAMIN) tablet Take 1 tablet by mouth daily.    [name] PROAIR RESPICLICK 169 (90 Base) MCG/ACT AEPB INHALE 2 PUFFS Q 4-6 H PRF COUGH/ WHEEZE  2  . atorvastatin (LIPITOR) 10 MG tablet Take 1 tablet (10 mg total) by mouth daily. 90 tablet 0  . diclofenac (VOLTAREN) 50 MG EC tablet Take 1 tablet (50 mg total) by mouth 2 (two) times daily as needed. 60 tablet 2  . hydrochlorothiazide (MICROZIDE) 12.5 MG capsule Take 12.5 mg by mouth daily.  4  . lisinopril (PRINIVIL,ZESTRIL) 20 MG tablet Take 20 mg by mouth daily.     . metFORMIN (GLUCOPHAGE) 1000 MG tablet TK 1 T PO BID  1  . montelukast (SINGULAIR) 10 MG tablet Take 1 tablet (10 mg total) by mouth at bedtime. 30 tablet 0  . gabapentin (NEURONTIN) 300 MG capsule Take 300 mg by mouth 2 (two) times daily.      No facility-administered medications prior to visit.     ROS See HPI  Objective:  BP (!) 150/80   Pulse 75   Temp 97.6 F (36.4 C) (Oral)   Ht 5' 7.5" (1.715 m)  Wt 242 lb 3.2 oz (109.9 kg)   SpO2 95%   BMI 37.37 kg/m   BP Readings from Last 3 Encounters:  09/06/18 (!) 150/80  06/06/18 120/70  05/09/18 130/76    Wt Readings from Last 3 Encounters:  09/06/18 242 lb 3.2 oz (109.9 kg)  06/06/18 242 lb (109.8 kg)  05/09/18 245 lb (111.1 kg)    Physical Exam Vitals signs reviewed.  Cardiovascular:     Rate and Rhythm: Normal rate and regular rhythm.     Pulses: Normal pulses.     Heart sounds: Normal heart sounds.  Pulmonary:     Effort: Pulmonary effort is normal.     Breath sounds: Normal breath sounds.  Musculoskeletal:     Right lower leg: No  edema.     Left lower leg: No edema.  Neurological:     Mental Status: He is alert and oriented to person, place, and time.    Lab Results  Component Value Date   WBC 6.4 06/06/2018   HGB 13.8 06/06/2018   HCT 41.4 06/06/2018   PLT 226.0 06/06/2018   GLUCOSE 108 (H) 09/06/2018   CHOL 184 09/06/2018   TRIG 235.0 (H) 09/06/2018   HDL 36.20 (L) 09/06/2018   LDLDIRECT 132.0 09/06/2018   ALT 26 06/06/2018   AST 19 06/06/2018   NA 137 09/06/2018   K 5.3 (H) 09/06/2018   CL 101 09/06/2018   CREATININE 1.31 09/06/2018   BUN 30 (H) 09/06/2018   CO2 29 09/06/2018   INR 1.07 08/28/2013   HGBA1C 6.4 06/06/2018   MICROALBUR 1.0 06/06/2018    Dg Chest 2 View  Result Date: 09/06/2014 CLINICAL DATA:  Sick for 2 weeks.  History of right lung surgery. EXAM: CHEST  2 VIEW COMPARISON:  12/03/2013 FINDINGS: Chronic volume loss in the right hemithorax related to prior chest surgery. The right hemidiaphragm is elevated. The lungs are clear without airspace disease. Heart and mediastinum are within normal limits. The trachea is midline. Negative for pleural effusions. IMPRESSION: No acute chest findings. Electronically Signed   By: Markus Daft M.D.   On: 09/06/2014 20:32    Assessment & Plan:   Voyd was seen today for follow-up.  Diagnoses and all orders for this visit:  CKD (chronic kidney disease) stage 3, GFR 30-59 ml/min (HCC) -     Basic metabolic panel  HTN (hypertension), benign -     Basic metabolic panel -     hydrochlorothiazide (MICROZIDE) 12.5 MG capsule; Take 1 capsule (12.5 mg total) by mouth daily. -     lisinopril (PRINIVIL,ZESTRIL) 20 MG tablet; Take 1 tablet (20 mg total) by mouth daily.  Mixed hyperlipidemia -     Lipid panel -     omega-3 acid ethyl esters (LOVAZA) 1 g capsule; Take 2 capsules (2 g total) by mouth 2 (two) times daily. -     atorvastatin (LIPITOR) 10 MG tablet; Take 1 tablet (10 mg total) by mouth daily at 6 PM.  Chronic cough -     Ambulatory  referral to Pulmonology -     montelukast (SINGULAIR) 10 MG tablet; Take 1 tablet (10 mg total) by mouth at bedtime.  Dyspnea on exertion -     B Nat Peptide -     Ambulatory referral to Pulmonology  Restrictive lung disease -     Ambulatory referral to Pulmonology  Pulmonary HTN ([place]) -     Ambulatory referral to Pulmonology  Type 2 diabetes mellitus with diabetic  polyneuropathy, without long-term current use of insulin (HCC) -     metFORMIN (GLUCOPHAGE) 850 MG tablet; Take 1 tablet (850 mg total) by mouth 2 (two) times daily with a meal.  Diabetic polyneuropathy associated with type 2 diabetes mellitus (HCC) -     gabapentin (NEURONTIN) 300 MG capsule; Take 1 capsule (300 mg total) by mouth 2 (two) times daily.  Other orders -     LDL cholesterol, direct   I have discontinued Caryn Section. Sussman's diclofenac. I have also changed his atorvastatin, hydrochlorothiazide, lisinopril, metFORMIN, and gabapentin. Additionally, I am having him start on omega-3 acid ethyl esters. Lastly, I am having him maintain his colchicine, aspirin EC, fluticasone, multivitamin, Fexofenadine HCl (MUCINEX ALLERGY PO), PROAIR RESPICLICK, albuterol, allopurinol, methocarbamol, BREO ELLIPTA, and montelukast.  Meds ordered this encounter  Medications  . omega-3 acid ethyl esters (LOVAZA) 1 g capsule    Sig: Take 2 capsules (2 g total) by mouth 2 (two) times daily.    Dispense:  240 capsule    Refill:  1    Order Specific Question:   Supervising Provider    Answer:   Lucille Passy [[id number]]  . atorvastatin (LIPITOR) 10 MG tablet    Sig: Take 1 tablet (10 mg total) by mouth daily at 6 PM.    Dispense:  90 tablet    Refill:  3    Order Specific Question:   Supervising Provider    Answer:   Lucille Passy [[id number]]  . hydrochlorothiazide (MICROZIDE) 12.5 MG capsule    Sig: Take 1 capsule (12.5 mg total) by mouth daily.    Dispense:  90 capsule    Refill:  0    Order Specific Question:   Supervising Provider     Answer:   Lucille Passy [[id number]]  . lisinopril (PRINIVIL,ZESTRIL) 20 MG tablet    Sig: Take 1 tablet (20 mg total) by mouth daily.    Dispense:  90 tablet    Refill:  1    Order Specific Question:   Supervising Provider    Answer:   Lucille Passy [[id number]]  . montelukast (SINGULAIR) 10 MG tablet    Sig: Take 1 tablet (10 mg total) by mouth at bedtime.    Dispense:  90 tablet    Refill:  3    Order Specific Question:   Supervising Provider    Answer:   Lucille Passy [[id number]]  . metFORMIN (GLUCOPHAGE) 850 MG tablet    Sig: Take 1 tablet (850 mg total) by mouth 2 (two) times daily with a meal.    Dispense:  180 tablet    Refill:  1    Order Specific Question:   Supervising Provider    Answer:   Lucille Passy [[id number]]  . gabapentin (NEURONTIN) 300 MG capsule    Sig: Take 1 capsule (300 mg total) by mouth 2 (two) times daily.    Dispense:  180 capsule    Refill:  1    Order Specific Question:   Supervising Provider    Answer:   Lucille Passy [[id number]]    Problem List Items Addressed This Visit      Cardiovascular and Mediastinum   HTN (hypertension), benign (Chronic)   Relevant Medications   omega-3 acid ethyl esters (LOVAZA) 1 g capsule   atorvastatin (LIPITOR) 10 MG tablet   hydrochlorothiazide (MICROZIDE) 12.5 MG capsule   lisinopril (PRINIVIL,ZESTRIL) 20 MG tablet   Other Relevant Orders   Basic metabolic panel (Completed)  Pulmonary HTN (HCC) (Chronic)   Relevant Medications   omega-3 acid ethyl esters (LOVAZA) 1 g capsule   atorvastatin (LIPITOR) 10 MG tablet   hydrochlorothiazide (MICROZIDE) 12.5 MG capsule   lisinopril (PRINIVIL,ZESTRIL) 20 MG tablet   Other Relevant Orders   Ambulatory referral to Pulmonology     Respiratory   Restrictive lung disease   Relevant Orders   Ambulatory referral to Pulmonology     Endocrine   Diabetic neuropathy associated with type 2 diabetes mellitus ([place])   Relevant Medications   atorvastatin (LIPITOR) 10 MG tablet   lisinopril  (PRINIVIL,ZESTRIL) 20 MG tablet   metFORMIN (GLUCOPHAGE) 850 MG tablet   gabapentin (NEURONTIN) 300 MG capsule   DM (diabetes mellitus), type 2 (HCC)   Relevant Medications   atorvastatin (LIPITOR) 10 MG tablet   lisinopril (PRINIVIL,ZESTRIL) 20 MG tablet   metFORMIN (GLUCOPHAGE) 850 MG tablet     Genitourinary   CKD (chronic kidney disease) stage 3, GFR 30-59 ml/min (HCC) - Primary    Persistent potassium at 5.3. Stop diclofenac  Repeat BMP in 22months Consider discontinuation of HCTZ and referral to nephrology if no improvement?      Relevant Orders   Basic metabolic panel (Completed)     Other   Chronic cough    Chronic, no change per patient. Productive (white to clear), associated to SOB with exertion). Denies any GERD Hx of RUL lung mass with s/p lumpectomy, restrictive lung disease, and pulmonary HTN. Use of albuterol, breo, and montelukast with minimal improvement.  Has not seen pulmonology or cardiology in several years  Current use of  ACE-I? Normal BNP Entered referral to pulmonology.      Relevant Medications   montelukast (SINGULAIR) 10 MG tablet   Other Relevant Orders   Ambulatory referral to Pulmonology   Dyspnea on exertion   Relevant Orders   B Nat Peptide (Completed)   Ambulatory referral to Pulmonology   Hyperlipidemia - well-controlled (Chronic)   Relevant Medications   omega-3 acid ethyl esters (LOVAZA) 1 g capsule   atorvastatin (LIPITOR) 10 MG tablet   hydrochlorothiazide (MICROZIDE) 12.5 MG capsule   lisinopril (PRINIVIL,ZESTRIL) 20 MG tablet   Other Relevant Orders   Lipid panel (Completed)       Follow-up: Return in about 3 months (around 12/05/2018) for DM and HTN, hyperlipidemia (fasting).  Wilfred Lacy, NP

## 2018-09-06 NOTE — Patient Instructions (Addendum)
Need to make appt with pulmonology due to Hx of pulmonary HTN and restrictive lung disease.  Stable but Persistent elevation in potassium level and CKD stage 3. Will continue to monitor every 8months. Abnormal lipid panelL elevated triglyceride, low HDL and high LDL. Need to add lovaza to atorvastatin. Yu need to maintain DASH diet and regular exercise. Repeat in 76months. Normal BNP. F/up with pulmonology as discussed.

## 2018-09-08 ENCOUNTER — Encounter: Payer: Self-pay | Admitting: Nurse Practitioner

## 2018-09-08 DIAGNOSIS — R053 Chronic cough: Secondary | ICD-10-CM | POA: Insufficient documentation

## 2018-09-08 DIAGNOSIS — R05 Cough: Secondary | ICD-10-CM | POA: Insufficient documentation

## 2018-09-08 MED ORDER — OMEGA-3-ACID ETHYL ESTERS 1 G PO CAPS: 2.0000 g | ORAL_CAPSULE | Freq: Two times a day (BID) | ORAL | 1 refills | Status: DC

## 2018-09-08 MED ORDER — GABAPENTIN 300 MG PO CAPS: 300.0000 mg | ORAL_CAPSULE | Freq: Two times a day (BID) | ORAL | 1 refills | Status: DC

## 2018-09-08 MED ORDER — MONTELUKAST SODIUM 10 MG PO TABS: 10.0000 mg | ORAL_TABLET | Freq: Every day | ORAL | 3 refills | Status: DC

## 2018-09-08 MED ORDER — ATORVASTATIN CALCIUM 10 MG PO TABS: 10.0000 mg | ORAL_TABLET | Freq: Every day | ORAL | 3 refills | Status: DC

## 2018-09-08 MED ORDER — HYDROCHLOROTHIAZIDE 12.5 MG PO CAPS: 12.5000 mg | ORAL_CAPSULE | Freq: Every day | ORAL | 0 refills | Status: DC

## 2018-09-08 MED ORDER — LISINOPRIL 20 MG PO TABS: 20.0000 mg | ORAL_TABLET | Freq: Every day | ORAL | 1 refills | Status: DC

## 2018-09-08 MED ORDER — METFORMIN HCL 850 MG PO TABS: 850.0000 mg | ORAL_TABLET | Freq: Two times a day (BID) | ORAL | 1 refills | Status: DC

## 2018-09-08 NOTE — Assessment & Plan Note (Addendum)
Chronic, no change per patient. Productive (white to clear), associated to SOB with exertion). Denies any GERD Hx of RUL lung mass with s/p lumpectomy, restrictive lung disease, and pulmonary HTN. Use of albuterol, breo, and montelukast with minimal improvement.  Has not seen pulmonology or cardiology in several years  Current use of  ACE-I? Normal BNP Entered referral to pulmonology.

## 2018-09-08 NOTE — Assessment & Plan Note (Signed)
Persistent potassium at 5.3. Stop diclofenac  Repeat BMP in 8months Consider discontinuation of HCTZ and referral to nephrology if no improvement?

## 2018-10-10 ENCOUNTER — Institutional Professional Consult (permissible substitution): Payer: Medicare Other | Admitting: Pulmonary Disease

## 2018-12-06 ENCOUNTER — Ambulatory Visit (INDEPENDENT_AMBULATORY_CARE_PROVIDER_SITE_OTHER): Payer: Medicare Other | Admitting: Nurse Practitioner

## 2018-12-06 ENCOUNTER — Encounter: Payer: Self-pay | Admitting: Nurse Practitioner

## 2018-12-06 VITALS — BP 132/77 | HR 66 | Temp 96.9°F | Ht 67.5 in | Wt 232.2 lb

## 2018-12-06 DIAGNOSIS — M1A9XX0 Chronic gout, unspecified, without tophus (tophi): Secondary | ICD-10-CM

## 2018-12-06 DIAGNOSIS — E1142 Type 2 diabetes mellitus with diabetic polyneuropathy: Secondary | ICD-10-CM | POA: Diagnosis not present

## 2018-12-06 DIAGNOSIS — I10 Essential (primary) hypertension: Secondary | ICD-10-CM | POA: Diagnosis not present

## 2018-12-06 DIAGNOSIS — N183 Chronic kidney disease, stage 3 unspecified: Secondary | ICD-10-CM

## 2018-12-06 DIAGNOSIS — E782 Mixed hyperlipidemia: Secondary | ICD-10-CM

## 2018-12-06 LAB — LIPID PANEL
Cholesterol: 115 mg/dL (ref 0–200)
HDL: 34.4 mg/dL — ABNORMAL LOW (ref >39.00)
LDL Cholesterol: 51 mg/dL (ref 0–99)
NonHDL: 80.59
Total CHOL/HDL Ratio: 3
Triglycerides: 149 mg/dL (ref 0.0–149.0)
VLDL: 29.8 mg/dL (ref 0.0–40.0)

## 2018-12-06 LAB — BASIC METABOLIC PANEL WITH GFR
BUN: 19 mg/dL (ref 6–23)
CO2: 32 meq/L (ref 19–32)
Calcium: 9.3 mg/dL (ref 8.4–10.5)
Chloride: 99 meq/L (ref 96–112)
Creatinine, Ser: 1.18 mg/dL (ref 0.40–1.50)
GFR: 60.3 mL/min
Glucose, Bld: 127 mg/dL — ABNORMAL HIGH (ref 70–99)
Potassium: 4.6 meq/L (ref 3.5–5.1)
Sodium: 138 meq/L (ref 135–145)

## 2018-12-06 LAB — HEMOGLOBIN A1C: Hgb A1c MFr Bld: 6.9 % — ABNORMAL HIGH (ref 4.6–6.5)

## 2018-12-06 LAB — URIC ACID: Uric Acid, Serum: 6.6 mg/dL (ref 4.0–7.8)

## 2018-12-06 NOTE — Progress Notes (Signed)
  Interactive audio and video telecommunications were attempted between this provider and patient, however failed, due to patient having technical difficulties OR patient did not have access to video capability.  We continued and completed visit with audio only.   Virtual Visit via Video Note  I connected with Andres Taylor on 12/08/18 at  9:00 AM EDT by a video enabled telemedicine application and verified that I am speaking with the correct person using two identifiers.  Location: Patient: Home Provider: Office   I discussed the limitations of evaluation and management by telemedicine and the availability of in person appointments. The patient expressed understanding and agreed to proceed.  CC: f/up on DM, HTN, Hyperlipidemia, and CKD.  History of Present Illness: denied any vital signs HTN: Controlled with lisinopril, and HCTZ  CKD-stage 3 with hyperkalemia: Needs repeat BMP. No edema, no palpitations  DM: Controlled with metformin, denies any GI side effectsa. Does not check glucose at home   Review of Systems  Constitutional: Negative.   Respiratory: Negative for cough and shortness of breath.   Cardiovascular: Negative for chest pain, palpitations and leg swelling.  Gastrointestinal: Negative.   Genitourinary: Negative.   Musculoskeletal: Negative for falls.  Skin: Negative.   Neurological: Negative for dizziness and headaches.   Observations/Objective: reviewed vital signs provided Alert at oriented x 4 Normal speech and voice tone. Unable to perform any additional physical exam due to telephone call  Assessment and Plan: Shin was seen today for follow-up.  Diagnoses and all orders for this visit:  HTN (hypertension), benign -     Basic metabolic panel; Future -     Basic metabolic panel -     hydrochlorothiazide (MICROZIDE) 12.5 MG capsule; Take 1 capsule (12.5 mg total) by mouth daily.  Type 2 diabetes mellitus with diabetic polyneuropathy,  without long-term current use of insulin (HCC) -     Basic metabolic panel; Future -     Hemoglobin A1c; Future -     Hemoglobin A1c -     Basic metabolic panel  CKD (chronic kidney disease) stage 3, GFR 30-59 ml/min (HCC) -     Basic metabolic panel; Future -     Basic metabolic panel  Mixed hyperlipidemia -     Lipid panel; Future -     Lipid panel  Chronic gout without tophus, unspecified cause, unspecified site -     Uric acid; Future -     Uric acid -     allopurinol (ZYLOPRIM) 100 MG tablet; Take 1 tablet by mouth daily   Follow Up Instructions: Stable lab results. Continue current medications. F/up in 70months   I discussed the assessment and treatment plan with the patient. The patient was provided an opportunity to ask questions and all were answered. The patient agreed with the plan and demonstrated an understanding of the instructions.   The patient was advised to call back or seek an in-person evaluation if the symptoms worsen or if the condition fails to improve as anticipated.  I provided 10 minutes of non-face-to-face time during this encounter.   Wilfred Lacy, NP

## 2018-12-07 ENCOUNTER — Other Ambulatory Visit: Payer: Medicare Other

## 2018-12-08 ENCOUNTER — Encounter: Payer: Self-pay | Admitting: Nurse Practitioner

## 2018-12-08 MED ORDER — ALLOPURINOL 100 MG PO TABS: ORAL_TABLET | ORAL | 1 refills | Status: DC

## 2018-12-08 MED ORDER — HYDROCHLOROTHIAZIDE 12.5 MG PO CAPS: 12.5000 mg | ORAL_CAPSULE | Freq: Every day | ORAL | 0 refills | Status: DC

## 2018-12-08 NOTE — Patient Instructions (Signed)
Stable lab results. Continue current medications. F/up in 72months

## 2018-12-29 ENCOUNTER — Telehealth: Payer: Self-pay | Admitting: Nurse Practitioner

## 2018-12-29 DIAGNOSIS — Z0279 Encounter for issue of other medical certificate: Secondary | ICD-10-CM

## 2018-12-29 NOTE — Telephone Encounter (Signed)
Patient came and dropped off DMV placard form that needs to be completed. Please call patient once forms are done. Phone number is [phone]. Forms will be in provider folder at the front.

## 2018-12-30 NOTE — Telephone Encounter (Signed)
Form completed, spouse is aware to pick up before 4 pm.

## 2019-01-05 ENCOUNTER — Other Ambulatory Visit: Payer: Self-pay | Admitting: Nurse Practitioner

## 2019-01-05 DIAGNOSIS — E782 Mixed hyperlipidemia: Secondary | ICD-10-CM

## 2019-01-17 ENCOUNTER — Ambulatory Visit (INDEPENDENT_AMBULATORY_CARE_PROVIDER_SITE_OTHER): Payer: Medicare Other | Admitting: Pulmonary Disease

## 2019-01-17 ENCOUNTER — Other Ambulatory Visit: Payer: Self-pay

## 2019-01-17 ENCOUNTER — Ambulatory Visit (INDEPENDENT_AMBULATORY_CARE_PROVIDER_SITE_OTHER): Payer: Medicare Other

## 2019-01-17 ENCOUNTER — Encounter: Payer: Self-pay | Admitting: Pulmonary Disease

## 2019-01-17 VITALS — BP 138/70 | HR 79 | Temp 97.7°F | Ht 68.0 in | Wt 241.0 lb

## 2019-01-17 DIAGNOSIS — R0602 Shortness of breath: Secondary | ICD-10-CM | POA: Diagnosis not present

## 2019-01-17 MED ORDER — IPRATROPIUM BROMIDE 0.06 % NA SOLN: 2.0000 | Freq: Four times a day (QID) | NASAL | 3 refills | Status: DC

## 2019-01-17 NOTE — Patient Instructions (Signed)
History of shortness of breath Significant postnasal drip with associated cough and clearing  Atrovent nasal To be used up to 4 times a day(may be used 2-4 times a day)  We will obtain a breathing study to compare with previous Obtain a chest x-ray to compare with previous  Continue to try to stay active  I will see you back in the office in about 6 to 8 weeks Call with significant concerns

## 2019-01-17 NOTE — Progress Notes (Signed)
Subjective:     Patient ID: Andres Taylor, male   DOB: 05-25-1946, 73 y.o.   MRN: [id number]  Patient being seen for shortness of breath and a chronic cough  Admits to a lot of postnasal drip  Multiple medications help keep her symptoms stable as possible  Never diagnosed with obstructive lung disease He does have a lot of allergies  Currently uses Breo-thinks it helps Uses Nasonex Uses albuterol inhaler He is on Zyrtec for his allergies  He did smoke in the past quit over 15 years ago  He had a spot removed from his lungs about 5 years ago-benign, it was initially found over 20 years ago  Worked in [employer] all his life  Denies any other significant risk factor for lung disease  He is limited with significant activity, walking 5 blocks will be challenging for him    Review of Systems  Constitutional: Negative.   HENT: Positive for postnasal drip and rhinorrhea.   Eyes: Negative.   Respiratory: Positive for cough, shortness of breath and wheezing.   Cardiovascular: Negative.   Gastrointestinal: Negative.   Endocrine: Negative.   Genitourinary: Negative.   Musculoskeletal: Positive for arthralgias and back pain.  Skin: Negative.   Allergic/Immunologic: Positive for environmental allergies.  Neurological: Negative.   Hematological: Negative.   Psychiatric/Behavioral: Negative.        Objective:   Physical Exam Constitutional:      Appearance: Normal appearance. He is obese.  HENT:     Head: Normocephalic and atraumatic.  Eyes:     Extraocular Movements: Extraocular movements intact.     Pupils: Pupils are equal, round, and reactive to light.  Neck:     Musculoskeletal: Normal range of motion and neck supple. No neck rigidity or muscular tenderness.  Cardiovascular:     Rate and Rhythm: Normal rate and regular rhythm.     Pulses: Normal pulses.     Heart sounds: Normal heart sounds. No murmur. No friction rub.  Pulmonary:     Effort: Pulmonary effort  is normal. No respiratory distress.     Breath sounds: No stridor. No wheezing, rhonchi or rales.  Abdominal:     General: There is no distension.     Palpations: There is no mass.     Tenderness: There is no abdominal tenderness.  Musculoskeletal: Normal range of motion.        General: No swelling, tenderness or deformity.  Skin:    General: Skin is warm and dry.     Coloration: Skin is not jaundiced or pale.  Neurological:     Mental Status: He is alert.     Cranial Nerves: No cranial nerve deficit.  Psychiatric:        Mood and Affect: Mood normal.   Chest x-ray from 2019 showed no acute infiltrate CT from 2015 did a 2.7 right upper lobe nodule-reviewed by myself PFT from 2015 significant for mild restriction-study reviewed He did have some fibrotic changes on a CT report from 2014     Assessment:     Nasal congestion and postnasal drip Chronic cough History of allergies -He uses nasal steroids at present with nonresolution of symptoms, he does feel that a little bit better -these also help his cough  History of lung nodule that was removed-benign  History of smoking in the past-no diagnosis of obstructive lung disease however he does note improvement with use of Breo  History of pulmonary hypertension -could not find an echo results on his  records  Past history of mild obstructive sleep apnea-not on any treatment -Feels he sleeps well without any significant problems with his sleep at present     Plan:      Trial with Atrovent nasal  To continue use of his current inhalers and Flonase  We will obtain a chest x-ray Obtain a pulmonary function test  If significant restriction on pulmonary function test and a repeat CT scan of the chest may be helpful to further clarify of presence of significant pulmonary fibrosis  Encouraged to stay active  I will see him back in the office in about 6 to 8 weeks Encouraged to call with any significant concerns

## 2019-01-18 ENCOUNTER — Other Ambulatory Visit: Payer: Self-pay | Admitting: Nurse Practitioner

## 2019-02-14 ENCOUNTER — Other Ambulatory Visit: Payer: Self-pay | Admitting: Nurse Practitioner

## 2019-02-14 NOTE — Telephone Encounter (Signed)
Last refill 1/13 #90 2 refills Last OV 5/26 no upcoming appts scheduled

## 2019-02-17 DIAGNOSIS — H5203 Hypermetropia, bilateral: Secondary | ICD-10-CM | POA: Diagnosis not present

## 2019-02-17 DIAGNOSIS — H43393 Other vitreous opacities, bilateral: Secondary | ICD-10-CM | POA: Diagnosis not present

## 2019-02-17 DIAGNOSIS — Z7984 Long term (current) use of oral hypoglycemic drugs: Secondary | ICD-10-CM | POA: Diagnosis not present

## 2019-02-17 DIAGNOSIS — H524 Presbyopia: Secondary | ICD-10-CM | POA: Diagnosis not present

## 2019-02-17 DIAGNOSIS — H52203 Unspecified astigmatism, bilateral: Secondary | ICD-10-CM | POA: Diagnosis not present

## 2019-02-17 DIAGNOSIS — H25043 Posterior subcapsular polar age-related cataract, bilateral: Secondary | ICD-10-CM | POA: Diagnosis not present

## 2019-02-17 DIAGNOSIS — H2513 Age-related nuclear cataract, bilateral: Secondary | ICD-10-CM | POA: Diagnosis not present

## 2019-02-17 DIAGNOSIS — H16223 Keratoconjunctivitis sicca, not specified as Sjogren's, bilateral: Secondary | ICD-10-CM | POA: Diagnosis not present

## 2019-02-17 DIAGNOSIS — E119 Type 2 diabetes mellitus without complications: Secondary | ICD-10-CM | POA: Diagnosis not present

## 2019-02-17 DIAGNOSIS — H02831 Dermatochalasis of right upper eyelid: Secondary | ICD-10-CM | POA: Diagnosis not present

## 2019-02-18 ENCOUNTER — Other Ambulatory Visit: Payer: Self-pay | Admitting: Nurse Practitioner

## 2019-02-18 DIAGNOSIS — I10 Essential (primary) hypertension: Secondary | ICD-10-CM

## 2019-02-18 DIAGNOSIS — E1142 Type 2 diabetes mellitus with diabetic polyneuropathy: Secondary | ICD-10-CM

## 2019-02-28 ENCOUNTER — Other Ambulatory Visit: Payer: Self-pay | Admitting: Pulmonary Disease

## 2019-02-28 ENCOUNTER — Ambulatory Visit: Payer: Medicare Other | Admitting: Pulmonary Disease

## 2019-03-08 ENCOUNTER — Other Ambulatory Visit: Payer: Self-pay | Admitting: Nurse Practitioner

## 2019-03-09 ENCOUNTER — Other Ambulatory Visit (HOSPITAL_COMMUNITY)
Admission: RE | Admit: 2019-03-09 | Discharge: 2019-03-09 | Disposition: A | Discharge status: Home / Self Care | Payer: Medicare Other | Source: Ambulatory Visit | Attending: Pulmonary Disease | Admitting: Pulmonary Disease

## 2019-03-09 DIAGNOSIS — Z01812 Encounter for preprocedural laboratory examination: Secondary | ICD-10-CM | POA: Diagnosis not present

## 2019-03-09 DIAGNOSIS — Z20828 Contact with and (suspected) exposure to other viral communicable diseases: Secondary | ICD-10-CM | POA: Diagnosis not present

## 2019-03-09 LAB — SARS CORONAVIRUS 2 (TAT 6-24 HRS): SARS Coronavirus 2: NEGATIVE

## 2019-03-13 ENCOUNTER — Encounter: Payer: Self-pay | Admitting: Family Medicine

## 2019-03-13 ENCOUNTER — Ambulatory Visit (INDEPENDENT_AMBULATORY_CARE_PROVIDER_SITE_OTHER): Payer: Medicare Other | Admitting: Pulmonary Disease

## 2019-03-13 ENCOUNTER — Encounter: Payer: Self-pay | Admitting: Pulmonary Disease

## 2019-03-13 ENCOUNTER — Other Ambulatory Visit: Payer: Self-pay

## 2019-03-13 ENCOUNTER — Ambulatory Visit (INDEPENDENT_AMBULATORY_CARE_PROVIDER_SITE_OTHER): Payer: Medicare Other | Admitting: Family Medicine

## 2019-03-13 VITALS — BP 122/74 | HR 78 | Temp 97.2°F | Ht 67.5 in | Wt 243.0 lb

## 2019-03-13 VITALS — BP 120/70 | HR 93 | Ht 67.5 in | Wt 243.0 lb

## 2019-03-13 DIAGNOSIS — N452 Orchitis: Secondary | ICD-10-CM | POA: Insufficient documentation

## 2019-03-13 DIAGNOSIS — R0602 Shortness of breath: Secondary | ICD-10-CM

## 2019-03-13 DIAGNOSIS — R195 Other fecal abnormalities: Secondary | ICD-10-CM | POA: Insufficient documentation

## 2019-03-13 DIAGNOSIS — R140 Abdominal distension (gaseous): Secondary | ICD-10-CM

## 2019-03-13 DIAGNOSIS — R0609 Other forms of dyspnea: Secondary | ICD-10-CM | POA: Diagnosis not present

## 2019-03-13 DIAGNOSIS — R053 Chronic cough: Secondary | ICD-10-CM

## 2019-03-13 DIAGNOSIS — K30 Functional dyspepsia: Secondary | ICD-10-CM | POA: Insufficient documentation

## 2019-03-13 DIAGNOSIS — R05 Cough: Secondary | ICD-10-CM

## 2019-03-13 DIAGNOSIS — R1084 Generalized abdominal pain: Secondary | ICD-10-CM

## 2019-03-13 HISTORY — DX: Abdominal distension (gaseous): R14.0

## 2019-03-13 HISTORY — DX: Other fecal abnormalities: R19.5

## 2019-03-13 HISTORY — DX: Generalized abdominal pain: R10.84

## 2019-03-13 LAB — PULMONARY FUNCTION TEST
DL/VA % pred: 105 %
DL/VA: 4.26 ml/min/mmHg/L
DLCO unc % pred: 63 %
DLCO unc: 14.8 ml/min/mmHg
FEF 25-75 Post: 2.76 L/sec
FEF 25-75 Pre: 1.7 L/sec
FEF2575-%Change-Post: 62 %
FEF2575-%Pred-Post: 134 %
FEF2575-%Pred-Pre: 82 %
FEV1-%Change-Post: 11 %
FEV1-%Pred-Post: 60 %
FEV1-%Pred-Pre: 54 %
FEV1-Post: 1.69 L
FEV1-Pre: 1.52 L
FEV1FVC-%Change-Post: 4 %
FEV1FVC-%Pred-Pre: 113 %
FEV6-%Change-Post: 6 %
FEV6-%Pred-Post: 53 %
FEV6-%Pred-Pre: 50 %
FEV6-Post: 1.95 L
FEV6-Pre: 1.82 L
FEV6FVC-%Pred-Post: 106 %
FEV6FVC-%Pred-Pre: 106 %
FVC-%Change-Post: 6 %
FVC-%Pred-Post: 50 %
FVC-%Pred-Pre: 47 %
FVC-Post: 1.95 L
FVC-Pre: 1.83 L
Post FEV1/FVC ratio: 87 %
Post FEV6/FVC ratio: 100 %
Pre FEV1/FVC ratio: 83 %
Pre FEV6/FVC Ratio: 100 %
RV % pred: 110 %
RV: 2.65 L
TLC % pred: 70 %
TLC: 4.63 L

## 2019-03-13 LAB — COMPREHENSIVE METABOLIC PANEL
ALT: 18 U/L (ref 0–53)
AST: 16 U/L (ref 0–37)
Albumin: 4.3 g/dL (ref 3.5–5.2)
Alkaline Phosphatase: 53 U/L (ref 39–117)
BUN: 23 mg/dL (ref 6–23)
CO2: 31 mEq/L (ref 19–32)
Calcium: 10.5 mg/dL (ref 8.4–10.5)
Chloride: 98 mEq/L (ref 96–112)
Creatinine, Ser: 1.12 mg/dL (ref 0.40–1.50)
GFR: 64 mL/min (ref >60.00)
Glucose, Bld: 116 mg/dL — ABNORMAL HIGH (ref 70–99)
Potassium: 5.2 mEq/L — ABNORMAL HIGH (ref 3.5–5.1)
Sodium: 137 mEq/L (ref 135–145)
Total Bilirubin: 0.5 mg/dL (ref 0.2–1.2)
Total Protein: 7.2 g/dL (ref 6.0–8.3)

## 2019-03-13 LAB — CBC
HCT: 37.3 % — ABNORMAL LOW (ref 39.0–52.0)
Hemoglobin: 12.5 g/dL — ABNORMAL LOW (ref 13.0–17.0)
MCHC: 33.4 g/dL (ref 30.0–36.0)
MCV: 88.1 fl (ref 78.0–100.0)
Platelets: 233 10*3/uL (ref 150.0–400.0)
RBC: 4.23 Mil/uL (ref 4.22–5.81)
RDW: 14.2 % (ref 11.5–15.5)
WBC: 9.8 10*3/uL (ref 4.0–10.5)

## 2019-03-13 MED ORDER — CIPROFLOXACIN HCL 500 MG PO TABS: 500.0000 mg | ORAL_TABLET | Freq: Two times a day (BID) | ORAL | 0 refills | Status: DC

## 2019-03-13 NOTE — Patient Instructions (Signed)
No significant changes to be made today  Continue using the nasal spray  Continue using your inhalers as we discussed today, your technique of using the inhaler is very important to get enough medication in your system to help  Call with any significant concerns  We will see you back in the office in 6 months

## 2019-03-13 NOTE — Progress Notes (Signed)
Full PFT performed today. °

## 2019-03-13 NOTE — Progress Notes (Signed)
  Subjective:     Patient ID: Andres Taylor, male   DOB: 03-01-1946, 73 y.o.   MRN: [id number]  Patient being seen for shortness of breath and a chronic cough  Admits to a lot of postnasal drip-has started using Atrovent nasal-it is helping  Symptoms are much better controlled  Never diagnosed with obstructive lung disease He does have a lot of allergies  He continues to use Breo, Atrovent nasal, Nasonex, albuterol nasal nebulization, Zyrtec  Quit smoking over 15 years ago Spot removed from his lungs about 5 years ago  No other significant changes in his health recently     Review of Systems  Constitutional: Negative.   HENT: Positive for postnasal drip and rhinorrhea.   Eyes: Negative.   Respiratory: Positive for cough and wheezing. Negative for shortness of breath.   Cardiovascular: Negative.   Gastrointestinal: Negative.   Endocrine: Negative.   Genitourinary: Negative.   Musculoskeletal: Positive for arthralgias and back pain.  Allergic/Immunologic: Positive for environmental allergies.  Neurological: Negative.   All other systems reviewed and are negative.      Objective:   Physical Exam Constitutional:      Appearance: Normal appearance. He is obese.  HENT:     Head: Normocephalic and atraumatic.  Eyes:     Extraocular Movements: Extraocular movements intact.     Pupils: Pupils are equal, round, and reactive to light.  Neck:     Musculoskeletal: Normal range of motion and neck supple. No neck rigidity or muscular tenderness.  Cardiovascular:     Rate and Rhythm: Normal rate and regular rhythm.     Pulses: Normal pulses.     Heart sounds: Normal heart sounds. No murmur. No friction rub.  Pulmonary:     Effort: Pulmonary effort is normal. No respiratory distress.     Breath sounds: No stridor. No wheezing, rhonchi or rales.  Chest:     Chest wall: No tenderness.  Abdominal:     General: There is no distension.     Palpations: There is no mass.    Tenderness: There is no abdominal tenderness.  Musculoskeletal: Normal range of motion.        General: No swelling, tenderness or deformity.  Neurological:     Mental Status: He is alert.   Chest x-ray from 2019 showed no acute infiltrate CT from 2015 did a 2.7 right upper lobe nodule-reviewed by myself PFT from 2015 significant for mild restriction-study reviewed He did have some fibrotic changes on a CT report from 2014  PFT performed today reveals airway hyperresponsiveness-no significant obstruction in the large airways     Assessment:     Nasal congestion and postnasal drip -Improved with use of Atrovent nasal  Chronic cough History of allergies -Improved with Atrovent nasal -Continues to use Breo regularly  History of lung nodule-benign  Smoking history -Continues on Breo  Past history of mild obstructive sleep apnea -Not on any treatment -Has no significant symptoms concerning for sleep disordered breathing     Plan:     .  Continue Atrovent nasal  .  Continue Breo .  Continue nebulization use  .  We will see him back in the office in about 6 months  .  Inhaler technique was reviewed  .  Call with significant concerns   Sherrilyn Rist, MD [place], PCCM Cell: [phone]

## 2019-03-13 NOTE — Progress Notes (Addendum)
Established Patient Office Visit  Subjective:  Patient ID: Andres Taylor, male    DOB: [birth year]  Age: 73 y.o. MRN: [id number]  CC:  Chief Complaint  Patient presents with  . Abdominal Pain    x 2 days, dark stools    HPI [name] presents for evaluation and treatment of a 3-day history of indigestion abdominal bloating and discomfort with a dark stool.  Patient denies frank blood in his stool but says that his stools have been black.  Denies any other changes in his bowel habits.  Reports concomitant indigestion.  He has no history of indigestion.  Denies any changes in his medicines.  Was taken off of diclofenac several months ago.  Advised to take an aspirin daily by his doctor.  He is due for follow-up colonoscopy.  He believes his last one was 10 to 12 years ago.  He has hernias in his abdominal wall.  Urine flow is about the same.  Denies any blood in his urine.  He had a appendectomy 7 years ago.  Past Medical History:  Diagnosis Date  . Allergic rhinitis   . Allergy   . Arthritis   . Blood in urine   . Calculus of kidney   . DDD (degenerative disc disease)   . Diabetes mellitus ([place])   . Eustachian tube dysfunction   . Fatty liver   . Gout   . Hyperlipidemia   . Hypertension   . Joint pain, knee   . Leg edema   . Obesity   . Pneumonitis    right lung  . Sciatica   . Shortness of breath    with minimal exertion  . Spinal stenosis     Past Surgical History:  Procedure Laterality Date  . APPENDECTOMY  1976  . BACK SURGERY    . CARDIAC CATHETERIZATION  04/20/13   no PCI  . [place]  2001  . HERNIA REPAIR     as a baby 75 months old  . LEFT AND RIGHT HEART CATHETERIZATION WITH CORONARY ANGIOGRAM N/A 04/20/2013   Procedure: LEFT AND RIGHT HEART CATHETERIZATION WITH CORONARY ANGIOGRAM;  Surgeon: Leonie Man, MD;  Location: [facility] CATH LAB;  Service: Cardiovascular;  Laterality: N/A;  . LITHOTRIPSY     x 2  . LOBECTOMY Right   .  lumbar disectomy L4-5  1980  . TRANSTHORACIC ECHOCARDIOGRAM  02/2013   EF 65-70%, moderate LVH (basal septal hypertrophy); normal wall motion; mild LA Dilation; PA pressures 47 mmHg  . VIDEO ASSISTED THORACOSCOPY (VATS)/WEDGE RESECTION Right 08/30/2013   Procedure: VIDEO ASSISTED THORACOSCOPY (VATS)/WEDGE RESECTION;  Surgeon: Melrose Nakayama, MD;  Location: [place];  Service: Thoracic;  Laterality: Right;    Family History  Problem Relation Age of Onset  . Leukemia Father   . Breast cancer Maternal Grandmother   . Cancer Maternal Grandmother        breast cancer  . Stroke Maternal Grandmother   . Hypertension Maternal Grandfather   . Stroke Maternal Grandfather   . Stroke Mother   . Cancer Son        Ewing's sarcoma  . Heart attack Son   . Stroke Paternal Grandmother     Social History   Socioeconomic History  . Marital status: Married    Spouse name: Not on file  . Number of children: 2  . Years of education: Not on file  . Highest education level: Not on file  Occupational History  . Occupation:  commerical realtor  Social Needs  . Financial resource strain: Not on file  . Food insecurity    Worry: Not on file    Inability: Not on file  . Transportation needs    Medical: Not on file    Non-medical: Not on file  Tobacco Use  . Smoking status: Former Smoker    Packs/day: 1.50    Years: 42.00    Pack years: 63.00    Types: Cigarettes    Quit date: 07/13/2002    Years since quitting: 16.6  . Smokeless tobacco: Never Used  Substance and Sexual Activity  . Alcohol use: Yes    Alcohol/week: 3.0 standard drinks    Types: 3 Cans of beer per week    Comment: 3 tmes weekly  . Drug use: No  . Sexual activity: Not on file  Lifestyle  . Physical activity    Days per week: Not on file    Minutes per session: Not on file  . Stress: Not on file  Relationships  . Social Herbalist on phone: Not on file    Gets together: Not on file    Attends religious  service: Not on file    Active member of club or organization: Not on file    Attends meetings of clubs or organizations: Not on file    Relationship status: Not on file  . Intimate partner violence    Fear of current or ex partner: Not on file    Emotionally abused: Not on file    Physically abused: Not on file    Forced sexual activity: Not on file  Other Topics Concern  . Not on file  Social History Narrative   He is process separation process/divorce.  He has 2 adult children   He lives alone in [place].  He works in Therapist, sports.   He quit smoking in 2004 after 50+ pack year smoking history.  He occasionally drinks alcohol.    Outpatient Medications Prior to Visit  Medication Sig Dispense Refill  . allopurinol (ZYLOPRIM) 100 MG tablet Take 1 tablet by mouth daily 90 tablet 1  . aspirin EC 81 MG tablet Take 81 mg by mouth daily.    [name] atorvastatin (LIPITOR) 10 MG tablet Take 1 tablet (10 mg total) by mouth daily at 6 PM. 90 tablet 3  . BREO ELLIPTA 200-25 MCG/INH AEPB INHALE 1 PUFF INTO THE LUNGS DAILY 1 each 5  . cetirizine (ZYRTEC) 10 MG chewable tablet Chew 10 mg by mouth daily.    . colchicine 0.6 MG tablet Take 0.6 mg by mouth 2 (two) times daily as needed (for gout). PRN for Gout    . Fexofenadine HCl (MUCINEX ALLERGY PO) Take by mouth. otc    . fluticasone (FLONASE) 50 MCG/ACT nasal spray INSTILL 2 SPRAYS TO EACH NOSTRIL D DURING ALLERGY/COLD/FLU SEASON  5  . gabapentin (NEURONTIN) 300 MG capsule Take 1 capsule (300 mg total) by mouth 2 (two) times daily. 180 capsule 1  . guaiFENesin (MUCINEX) 600 MG 12 hr tablet Take 600 mg by mouth daily.    . hydrochlorothiazide (MICROZIDE) 12.5 MG capsule TAKE 1 CAPSULE(12.5 MG) BY MOUTH DAILY 90 capsule 0  . ipratropium (ATROVENT) 0.06 % nasal spray Place 2 sprays into the nose 4 (four) times daily. 30 mL 3  . lisinopril (PRINIVIL,ZESTRIL) 20 MG tablet Take 1 tablet (20 mg total) by mouth daily. 90 tablet 1  .  metFORMIN (GLUCOPHAGE) 850 MG tablet  TAKE 1 TABLET(850 MG) BY MOUTH TWICE DAILY WITH A MEAL 180 tablet 0  . methocarbamol (ROBAXIN) 500 MG tablet TAKE 1 TABLET(500 MG) BY MOUTH EVERY 8 HOURS AS NEEDED FOR MUSCLE SPASMS 90 tablet 1  . montelukast (SINGULAIR) 10 MG tablet Take 1 tablet (10 mg total) by mouth at bedtime. 90 tablet 3  . Multiple Vitamin (MULTIVITAMIN) tablet Take 1 tablet by mouth daily.    [name] omega-3 acid ethyl esters (LOVAZA) 1 g capsule TAKE 2 CAPSULES BY MOUTH TWICE DAILY 240 capsule 2  . PROAIR RESPICLICK 123XX123 (90 Base) MCG/ACT AEPB INHALE 2 PUFFS EVERY 4-6 HOURS AS NEEDED FOR COUGH/ WHEEZE 1 each 1   No facility-administered medications prior to visit.     No Known Allergies  ROS Review of Systems  Constitutional: Negative for chills, diaphoresis, fatigue, fever and unexpected weight change.  HENT: Negative.   Eyes: Negative for photophobia and visual disturbance.  Respiratory: Negative for chest tightness, shortness of breath and wheezing.   Cardiovascular: Negative for chest pain and palpitations.  Gastrointestinal: Positive for abdominal distention and abdominal pain. Negative for anal bleeding, blood in stool, constipation, diarrhea, nausea and vomiting.  Endocrine: Negative for polyphagia and polyuria.  Genitourinary: Negative.  Negative for difficulty urinating, discharge, flank pain, frequency, scrotal swelling and testicular pain.  Musculoskeletal: Negative for arthralgias and joint swelling.  Skin: Negative for pallor and rash.  Allergic/Immunologic: Negative for immunocompromised state.  Neurological: Negative for dizziness, weakness and light-headedness.  Hematological: Negative.   Psychiatric/Behavioral: Negative.       Objective:    Physical Exam  Constitutional: He is oriented to person, place, and time. He appears well-developed and well-nourished. No distress.  HENT:  Head: Normocephalic and atraumatic.  Right Ear: External ear normal.  Left Ear:  External ear normal.  Eyes: Pupils are equal, round, and reactive to light. Conjunctivae are normal. Right eye exhibits no discharge. Left eye exhibits no discharge. No scleral icterus.  Neck: Neck supple. No JVD present. No tracheal deviation present. No thyromegaly present.  Cardiovascular: Normal rate, regular rhythm and normal heart sounds.  Pulmonary/Chest: Effort normal and breath sounds normal. No stridor.  Abdominal: Bowel sounds are normal. He exhibits distension. There is abdominal tenderness in the periumbilical area. There is no rebound, no guarding and no CVA tenderness. A hernia is present. Hernia confirmed positive in the ventral area. Hernia confirmed negative in the right inguinal area and confirmed negative in the left inguinal area.    Genitourinary: Rectum:     Guaiac result positive.     External hemorrhoid present.     No rectal mass, anal fissure, tenderness, internal hemorrhoid or abnormal anal tone.  Prostate is enlarged. Prostate is not tender. Right testis shows tenderness. Right testis shows no mass and no swelling. Right testis is descended. Left testis shows no mass, no swelling and no tenderness. Left testis is descended. No hypospadias, penile erythema or penile tenderness. No discharge found.  Musculoskeletal:        General: No edema.  Lymphadenopathy:    He has no cervical adenopathy.       Right: No inguinal adenopathy present.       Left: No inguinal adenopathy present.  Neurological: He is alert and oriented to person, place, and time.  Skin: Skin is warm and dry.  Psychiatric: He has a normal mood and affect. His behavior is normal.    BP 120/70   Pulse 93   Ht 5' 7.5" (1.715 m)  Wt 243 lb (110.2 kg)   SpO2 95%   BMI 37.50 kg/m  Wt Readings from Last 3 Encounters:  03/13/19 243 lb (110.2 kg)  03/13/19 243 lb (110.2 kg)  01/17/19 241 lb (109.3 kg)   BP Readings from Last 3 Encounters:  03/13/19 120/70  03/13/19 122/74  01/17/19 138/70    Guideline developer:  UpToDate (see UpToDate for funding source) Date Released: June 2014  Health Maintenance Due  Topic Date Due  . OPHTHALMOLOGY EXAM  12/15/2018  . INFLUENZA VACCINE  02/11/2019    There are no preventive care reminders to display for this patient.  No results found for: TSH Lab Results  Component Value Date   WBC 6.4 06/06/2018   HGB 13.8 06/06/2018   HCT 41.4 06/06/2018   MCV 89.2 06/06/2018   PLT 226.0 06/06/2018   Lab Results  Component Value Date   NA 138 12/06/2018   K 4.6 12/06/2018   CO2 32 12/06/2018   GLUCOSE 127 (H) 12/06/2018   BUN 19 12/06/2018   CREATININE 1.18 12/06/2018   BILITOT 0.5 06/06/2018   ALKPHOS 57 06/06/2018   AST 19 06/06/2018   ALT 26 06/06/2018   PROT 7.1 06/06/2018   ALBUMIN 4.6 06/06/2018   CALCIUM 9.3 12/06/2018   GFR 60.30 12/06/2018   Lab Results  Component Value Date   CHOL 115 12/06/2018   Lab Results  Component Value Date   HDL 34.40 (L) 12/06/2018   Lab Results  Component Value Date   LDLCALC 51 12/06/2018   Lab Results  Component Value Date   TRIG 149.0 12/06/2018   Lab Results  Component Value Date   CHOLHDL 3 12/06/2018   Lab Results  Component Value Date   HGBA1C 6.9 (H) 12/06/2018      Assessment & Plan:   Problem List Items Addressed This Visit      Genitourinary   Orchitis, right   Relevant Medications   ciprofloxacin (CIPRO) 500 MG tablet   Other Relevant Orders   Urinalysis, Routine w reflex microscopic   CBC     Other   Abdominal bloating   Relevant Orders   Urinalysis, Routine w reflex microscopic   CBC   Comprehensive metabolic panel   CT Abdomen Pelvis W Contrast   Indigestion   Relevant Orders   CBC   Comprehensive metabolic panel   CT Abdomen Pelvis W Contrast   Heme positive stool - Primary   Relevant Orders   CBC   CT Abdomen Pelvis W Contrast   Generalized abdominal pain   Relevant Orders   Urinalysis, Routine w reflex microscopic   CBC    Comprehensive metabolic panel   CT Abdomen Pelvis W Contrast      Meds ordered this encounter  Medications  . ciprofloxacin (CIPRO) 500 MG tablet    Sig: Take 1 tablet (500 mg total) by mouth 2 (two) times daily.    Dispense:  20 tablet    Refill:  0    Follow-up: Return in about 4 days (around 03/17/2019).    We will start above work-up.  Patient is to go to the emergency room immediately if he becomes worse.  Otherwise follow-up in 4 days.

## 2019-03-14 LAB — URINALYSIS, ROUTINE W REFLEX MICROSCOPIC
Bilirubin Urine: NEGATIVE
Hgb urine dipstick: NEGATIVE
Ketones, ur: NEGATIVE
Leukocytes,Ua: NEGATIVE
Nitrite: NEGATIVE
RBC / HPF: NONE SEEN (ref 0–?)
Specific Gravity, Urine: 1.02 (ref 1.000–1.030)
Total Protein, Urine: NEGATIVE
Urine Glucose: NEGATIVE
Urobilinogen, UA: 0.2 (ref 0.0–1.0)
pH: 5 (ref 5.0–8.0)

## 2019-03-15 ENCOUNTER — Ambulatory Visit (HOSPITAL_BASED_OUTPATIENT_CLINIC_OR_DEPARTMENT_OTHER)
Admission: RE | Admit: 2019-03-15 | Discharge: 2019-03-15 | Disposition: A | Discharge status: Home / Self Care | Payer: Medicare Other | Source: Ambulatory Visit | Attending: Family Medicine | Admitting: Family Medicine

## 2019-03-15 ENCOUNTER — Encounter (HOSPITAL_BASED_OUTPATIENT_CLINIC_OR_DEPARTMENT_OTHER): Payer: Self-pay

## 2019-03-15 ENCOUNTER — Other Ambulatory Visit: Payer: Self-pay

## 2019-03-15 DIAGNOSIS — K30 Functional dyspepsia: Secondary | ICD-10-CM | POA: Diagnosis not present

## 2019-03-15 DIAGNOSIS — K4090 Unilateral inguinal hernia, without obstruction or gangrene, not specified as recurrent: Secondary | ICD-10-CM | POA: Diagnosis not present

## 2019-03-15 DIAGNOSIS — R1084 Generalized abdominal pain: Secondary | ICD-10-CM | POA: Insufficient documentation

## 2019-03-15 DIAGNOSIS — R195 Other fecal abnormalities: Secondary | ICD-10-CM | POA: Insufficient documentation

## 2019-03-15 DIAGNOSIS — R140 Abdominal distension (gaseous): Secondary | ICD-10-CM | POA: Diagnosis not present

## 2019-03-15 MED ORDER — IOHEXOL 300 MG/ML  SOLN
100.0000 mL | Freq: Once | INTRAMUSCULAR | Status: AC | PRN
  Administered 2019-03-15: 08:00:00 100 mL via INTRAVENOUS

## 2019-03-16 ENCOUNTER — Other Ambulatory Visit: Payer: Self-pay

## 2019-03-17 ENCOUNTER — Encounter: Payer: Self-pay | Admitting: Gastroenterology

## 2019-03-17 ENCOUNTER — Ambulatory Visit (INDEPENDENT_AMBULATORY_CARE_PROVIDER_SITE_OTHER): Payer: Medicare Other | Admitting: Nurse Practitioner

## 2019-03-17 ENCOUNTER — Encounter: Payer: Self-pay | Admitting: Nurse Practitioner

## 2019-03-17 ENCOUNTER — Other Ambulatory Visit: Payer: Self-pay | Admitting: Nurse Practitioner

## 2019-03-17 VITALS — BP 118/70 | HR 84 | Temp 97.6°F | Ht 67.5 in | Wt 235.8 lb

## 2019-03-17 DIAGNOSIS — K30 Functional dyspepsia: Secondary | ICD-10-CM

## 2019-03-17 DIAGNOSIS — R195 Other fecal abnormalities: Secondary | ICD-10-CM | POA: Diagnosis not present

## 2019-03-17 DIAGNOSIS — K4031 Unilateral inguinal hernia, with obstruction, without gangrene, recurrent: Secondary | ICD-10-CM | POA: Diagnosis not present

## 2019-03-17 DIAGNOSIS — K5900 Constipation, unspecified: Secondary | ICD-10-CM

## 2019-03-17 MED ORDER — PANTOPRAZOLE SODIUM 20 MG PO TBEC: 20.0000 mg | DELAYED_RELEASE_TABLET | Freq: Two times a day (BID) | ORAL | 1 refills | Status: DC

## 2019-03-17 MED ORDER — DOCUSATE SODIUM 100 MG PO CAPS: 100.0000 mg | ORAL_CAPSULE | Freq: Every day | ORAL | 0 refills | Status: DC

## 2019-03-17 NOTE — Patient Instructions (Signed)
Maintain Bland diet x 2days, then advance as tolerated. Start pantoprazole 20mg  BID and colace once daily.  Go to ED if symptoms reoccur or worsen.  You will be contacted to schedule appt with surgeon and GI   Abdominal Pain, Adult  Many things can cause belly (abdominal) pain. Most times, belly pain is not dangerous. Many cases of belly pain can be watched and treated at home. Sometimes belly pain is serious, though. Your doctor will try to find the cause of your belly pain. Follow these instructions at home:  Take over-the-counter and prescription medicines only as told by your doctor. Do not take medicines that help you poop (laxatives) unless told to by your doctor.  Drink enough fluid to keep your pee (urine) clear or pale yellow.  Watch your belly pain for any changes.  Keep all follow-up visits as told by your doctor. This is important. Contact a doctor if:  Your belly pain changes or gets worse.  You are not hungry, or you lose weight without trying.  You are having trouble pooping (constipated) or have watery poop (diarrhea) for more than 2-3 days.  You have pain when you pee or poop.  Your belly pain wakes you up at night.  Your pain gets worse with meals, after eating, or with certain foods.  You are throwing up and cannot keep anything down.  You have a fever. Get help right away if:  Your pain does not go away as soon as your doctor says it should.  You cannot stop throwing up.  Your pain is only in areas of your belly, such as the right side or the left lower part of the belly.  You have bloody or black poop, or poop that looks like tar.  You have very bad pain, cramping, or bloating in your belly.  You have signs of not having enough fluid or water in your body (dehydration), such as: ? Dark pee, very little pee, or no pee. ? Cracked lips. ? Dry mouth. ? Sunken eyes. ? Sleepiness. ? Weakness. This information is not intended to replace advice  given to you by your health care provider. Make sure you discuss any questions you have with your health care provider. Document Released: 12/16/2007 Document Revised: 01/17/2016 Document Reviewed: 12/11/2015 Elsevier Interactive Patient Education  [employer].

## 2019-03-17 NOTE — Telephone Encounter (Signed)
Please advise, ok to send in 90 days supply?

## 2019-03-17 NOTE — Progress Notes (Signed)
Subjective:  Patient ID: Andres Taylor, male    DOB: July 25, 1945  Age: 73 y.o. MRN: [id number]  CC: Follow-up (follow up /CT result--next step--)  HPI Andres Taylor is here to discuss CT ABD/pelvis results. He was evaluated by Dr. Ethelene Hal 03/13/2019 due to ABD pain, nausea, and black stool. Today he states symptoms have resolved. Last BM 03/14/2019. Denies any fever or nausea today.  Reviewed past Medical, Social and Family history today.  Outpatient Medications Prior to Visit  Medication Sig Dispense Refill   allopurinol (ZYLOPRIM) 100 MG tablet Take 1 tablet by mouth daily 90 tablet 1   aspirin EC 81 MG tablet Take 81 mg by mouth daily.     atorvastatin (LIPITOR) 10 MG tablet Take 1 tablet (10 mg total) by mouth daily at 6 PM. 90 tablet 3   BREO ELLIPTA 200-25 MCG/INH AEPB INHALE 1 PUFF INTO THE LUNGS DAILY 1 each 5   cetirizine (ZYRTEC) 10 MG chewable tablet Chew 10 mg by mouth daily.     ciprofloxacin (CIPRO) 500 MG tablet Take 1 tablet (500 mg total) by mouth 2 (two) times daily. 20 tablet 0   colchicine 0.6 MG tablet Take 0.6 mg by mouth 2 (two) times daily as needed (for gout). PRN for Gout     Fexofenadine HCl (MUCINEX ALLERGY PO) Take by mouth. otc     fluticasone (FLONASE) 50 MCG/ACT nasal spray INSTILL 2 SPRAYS TO EACH NOSTRIL D DURING ALLERGY/COLD/FLU SEASON  5   guaiFENesin (MUCINEX) 600 MG 12 hr tablet Take 600 mg by mouth daily.     hydrochlorothiazide (MICROZIDE) 12.5 MG capsule TAKE 1 CAPSULE(12.5 MG) BY MOUTH DAILY 90 capsule 0   ipratropium (ATROVENT) 0.06 % nasal spray Place 2 sprays into the nose 4 (four) times daily. 30 mL 3   lisinopril (PRINIVIL,ZESTRIL) 20 MG tablet Take 1 tablet (20 mg total) by mouth daily. 90 tablet 1   metFORMIN (GLUCOPHAGE) 850 MG tablet TAKE 1 TABLET(850 MG) BY MOUTH TWICE DAILY WITH A MEAL 180 tablet 0   methocarbamol (ROBAXIN) 500 MG tablet TAKE 1 TABLET(500 MG) BY MOUTH EVERY 8 HOURS AS NEEDED FOR MUSCLE SPASMS 90 tablet 1     montelukast (SINGULAIR) 10 MG tablet Take 1 tablet (10 mg total) by mouth at bedtime. 90 tablet 3   Multiple Vitamin (MULTIVITAMIN) tablet Take 1 tablet by mouth daily.     omega-3 acid ethyl esters (LOVAZA) 1 g capsule TAKE 2 CAPSULES BY MOUTH TWICE DAILY 240 capsule 2   PROAIR RESPICLICK 123XX123 (90 Base) MCG/ACT AEPB INHALE 2 PUFFS EVERY 4-6 HOURS AS NEEDED FOR COUGH/ WHEEZE 1 each 1   gabapentin (NEURONTIN) 300 MG capsule Take 1 capsule (300 mg total) by mouth 2 (two) times daily. 180 capsule 1   No facility-administered medications prior to visit.     ROS See HPI  Objective:  BP 118/70    Pulse 84    Temp 97.6 F (36.4 C) (Tympanic)    Ht 5' 7.5" (1.715 m)    Wt 235 lb 12.8 oz (107 kg)    SpO2 96%    BMI 36.39 kg/m   BP Readings from Last 3 Encounters:  03/17/19 118/70  03/13/19 120/70  03/13/19 122/74   Wt Readings from Last 3 Encounters:  03/17/19 235 lb 12.8 oz (107 kg)  03/13/19 243 lb (110.2 kg)  03/13/19 243 lb (110.2 kg)   Physical Exam Vitals signs reviewed.  Cardiovascular:     Pulses: Normal pulses.  Pulmonary:  Effort: Pulmonary effort is normal.  Abdominal:     Palpations: Abdomen is soft.     Tenderness: There is no abdominal tenderness.  Neurological:     Mental Status: He is alert and oriented to person, place, and time.    Lab Results  Component Value Date   WBC 9.8 03/13/2019   HGB 12.5 (L) 03/13/2019   HCT 37.3 (L) 03/13/2019   PLT 233.0 03/13/2019   GLUCOSE 116 (H) 03/13/2019   CHOL 115 12/06/2018   TRIG 149.0 12/06/2018   HDL 34.40 (L) 12/06/2018   LDLDIRECT 132.0 09/06/2018   LDLCALC 51 12/06/2018   ALT 18 03/13/2019   AST 16 03/13/2019   NA 137 03/13/2019   K 5.2 (H) 03/13/2019   CL 98 03/13/2019   CREATININE 1.12 03/13/2019   BUN 23 03/13/2019   CO2 31 03/13/2019   INR 1.07 08/28/2013   HGBA1C 6.9 (H) 12/06/2018   MICROALBUR 1.0 06/06/2018    Ct Abdomen Pelvis W Contrast  Result Date: 03/15/2019 CLINICAL DATA:   Abdominal pain, gastroenteritis or colitis suspected, right-sided abdominal pains, abdominal distension, heme-positive stool, possible hernia EXAM: CT ABDOMEN AND PELVIS WITH CONTRAST TECHNIQUE: Multidetector CT imaging of the abdomen and pelvis was performed using the standard protocol following bolus administration of intravenous contrast. CONTRAST:  129mL OMNIPAQUE IOHEXOL 300 MG/ML  SOLN COMPARISON:  CT chest, 07/26/2013 FINDINGS: Lower chest: Right basilar scarring and atelectasis, similar to prior CT. Coronary artery calcifications. Hepatobiliary: No solid liver abnormality is seen. No gallstones, gallbladder wall thickening, or biliary dilatation. Pancreas: Unremarkable. No pancreatic ductal dilatation or surrounding inflammatory changes. Spleen: Normal in size without significant abnormality. Adrenals/Urinary Tract: Unchanged, benign 1.7 cm nodule of the left adrenal gland. Tiny nonobstructive inferior pole calculus of the left kidney. Kidneys are otherwise normal, without renal calculi, solid lesion, or hydronephrosis. Bladder is unremarkable. Stomach/Bowel: Stomach is within normal limits. Appendix appears normal. No evidence of bowel wall thickening, distention, or inflammatory changes. Vascular/Lymphatic: Aortic atherosclerosis. No enlarged abdominal or pelvic lymph nodes. Reproductive: No mass or other significant abnormality. Other: There is a moderate right-sided inguinal hernia which contains primarily fat although a small portion of the bladder dome (series 2, image 90). There is a small left-sided fat containing inguinal hernia. No abdominopelvic ascites. Musculoskeletal: No acute or significant osseous findings. Disc degenerative disease of the lumbar spine. IMPRESSION: 1. There is a moderate right-sided inguinal hernia which contains primarily fat although a small portion of the bladder dome (series 2, image 90). No bowel involvement. There is a small left-sided fat containing inguinal hernia. 2.  No other CT findings of the abdomen or pelvis to explain pain or heme-positive stools. 3.  Other chronic and incidental findings as detailed above. Electronically Signed   By: Eddie Candle M.D.   On: 03/15/2019 11:18   Assessment & Plan:   I spent 80% of today's explaining CT ABD/pelvis results to Andres Taylor. We also talked about his plan of care and possible expectations when seen by GI and surgeon. All his questions were questioned to his satisfaction.  Santy was seen today for follow-up.  Diagnoses and all orders for this visit:  Heme positive stool -     Ambulatory referral to Gastroenterology -     pantoprazole (PROTONIX) 20 MG tablet; Take 1 tablet (20 mg total) by mouth 2 (two) times daily.  Indigestion -     Ambulatory referral to Gastroenterology -     pantoprazole (PROTONIX) 20 MG tablet; Take 1 tablet (  20 mg total) by mouth 2 (two) times daily.  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   I have discontinued Caryn Section. Lehmkuhl's gabapentin. I am also having him start on pantoprazole and docusate sodium. Additionally, I am having him maintain his colchicine, aspirin EC, fluticasone, multivitamin, Fexofenadine HCl (MUCINEX ALLERGY PO), atorvastatin, lisinopril, montelukast, allopurinol, omega-3 acid ethyl esters, cetirizine, guaiFENesin, ipratropium, Breo Ellipta, methocarbamol, hydrochlorothiazide, metFORMIN, ProAir RespiClick, and ciprofloxacin.  Meds ordered this encounter  Medications   pantoprazole (PROTONIX) 20 MG tablet    Sig: Take 1 tablet (20 mg total) by mouth 2 (two) times daily.    Dispense:  60 tablet    Refill:  1    Order Specific Question:   Supervising Provider    Answer:   MATTHEWS, CODY [[id number]]   docusate sodium (COLACE) 100 MG capsule     Sig: Take 1 capsule (100 mg total) by mouth daily.    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  Wilfred Lacy, NP

## 2019-03-27 ENCOUNTER — Ambulatory Visit: Payer: Self-pay | Admitting: Surgery

## 2019-03-27 DIAGNOSIS — K4020 Bilateral inguinal hernia, without obstruction or gangrene, not specified as recurrent: Secondary | ICD-10-CM | POA: Diagnosis not present

## 2019-03-27 DIAGNOSIS — G8929 Other chronic pain: Secondary | ICD-10-CM | POA: Diagnosis not present

## 2019-03-27 DIAGNOSIS — R0600 Dyspnea, unspecified: Secondary | ICD-10-CM | POA: Diagnosis not present

## 2019-03-27 DIAGNOSIS — H9190 Unspecified hearing loss, unspecified ear: Secondary | ICD-10-CM | POA: Diagnosis not present

## 2019-03-27 DIAGNOSIS — N433 Hydrocele, unspecified: Secondary | ICD-10-CM | POA: Diagnosis not present

## 2019-03-27 DIAGNOSIS — M545 Low back pain: Secondary | ICD-10-CM | POA: Diagnosis not present

## 2019-03-27 NOTE — H&P (Signed)
Andres Taylor Documented: 03/27/2019 1:55 PM Location: [facility] Patient #: [id number] DOB: 1946-06-24 Married / Language: English / Race: White Male  History of Present Illness Andres Hector MD; 03/27/2019 5:28 PM) The patient is a 73 year old male who presents with an inguinal hernia. Note for "Inguinal hernia": ` ` ` Patient sent for surgical consultation at the request of Wilfred Lacy, NP [facility]  Chief Complaint: Bilateral inguinal hernias. Right greater than left. ` ` The patient is a pleasant gentleman who comes today with a walker. Apparently he had an inguinal hernia repaired when he was an infant at 19 months of age. He is having some crampy abdominal pain of concern. Concern for possible orchiectomy distant placed on a short course of Cipro. CT scan was done 31st of August. It showed right greater than left inguinal hernias. Surgical consultation requested.  Patient comes today with a walker. Usually uses a little bit for balance but has a very painful left ankle that he feels is due to a gout flare. He is relatively active and independent despite his chronic low back pain and obesity. He had to have chest surgery done for a lung mass. Turnout to be benign. Had cardiac catheterization done before hand that was mostly underwhelming by Dr. Ellyn Hack with cardiology back in 2014. Patient notes he's had an inguinal hernia for a while but was recommended to just follow with according to his prior primary care physician. His main abdominal complaints were epigastric and not really in the right lower quadrant her lower abdomen. His epigastric pain is gone away. With the moderately large inguinal hernia noted on recent CAT scan, reevaluation reconsidered. Patient bleeds Cipro course and feels like his testicles are not that bothersome now, although later he confessed they've always been very sensitive. He is a diabetic on oral hypoglycemics. His A1c  is always been in the sixes. His wife is a retired Marine scientist and wondered if he needed surgery. There is some concern about ulcers, so he taken off his diclofenac. He notes he had better pain control with that, but was compliant to minimize the risk of ulcers.  (Review of systems as stated in this history (HPI) or in the review of systems. Otherwise all other 12 point ROS are negative) ` ` `   Allergies (Tanisha A. Owens Shark, [place]; 03/27/2019 1:57 PM) No Known Drug Allergies [03/27/2019]: Allergies Reconciled  Medication History (Tanisha A. Owens Shark, RMA; 03/27/2019 2:00 PM) Allopurinol (100MG  Tablet, Oral) Active. Aspirin (81MG  Tablet, Oral) Active. Atorvastatin Calcium (10MG  Tablet, Oral) Active. Breo Ellipta (200-25MCG/INH Aero Pow Br Act, Inhalation) Active. Ciprofloxacin HCl (500MG  Tablet, Oral) Active. Colchicine (0.6MG  Capsule, Oral) Active. Docusate Sodium (100MG  Tablet, Oral) Active. Flonase (50MCG/DOSE Inhaler, Nasal) Active. Mucinex (600MG  Tablet ER 12HR, Oral) Active. hydroCHLOROthiazide (12.5MG  Tablet, Oral) Active. Lisinopril (20MG  Tablet, Oral) Active. metFORMIN HCl (850MG  Tablet, Oral) Active. Robaxin (500MG  Tablet, Oral) Active. Singulair (10MG  Tablet, Oral) Active. Pantoprazole Sodium (20MG  Tablet DR, Oral) Active. ProAir RespiClick ([id number] (90 Base)MCG/ACT Aero Pow Br Act, Inhalation) Active. Medications Reconciled    Vitals (Tanisha A. Brown RMA; 03/27/2019 1:57 PM) 03/27/2019 1:56 PM Weight: 237.4 lb Height: 67.5in Body Surface Area: 2.19 m Body Mass Index: 36.63 kg/m  Temp.: 98.86F  Pulse: 95 (Regular)  BP: 132/84 (Sitting, Left Arm, Standard)        Physical Exam Andres Hector MD; 03/27/2019 5:26 PM)  General Mental Status-Alert. General Appearance-Not in acute distress, Not Sickly. Orientation-Oriented X3. Hydration-Well hydrated. Voice-Normal.  Integumentary Global Assessment Upon inspection and palpation  of skin surfaces of the - Axillae: non-tender, no inflammation or ulceration, no drainage. and Distribution of scalp and body hair is normal. General Characteristics Temperature - normal warmth is noted.  Head and Neck Head-normocephalic, atraumatic with no lesions or palpable masses. Face Global Assessment - atraumatic, no absence of expression. Neck Global Assessment - no abnormal movements, no bruit auscultated on the right, no bruit auscultated on the left, no decreased range of motion, non-tender. Trachea-midline. Thyroid Gland Characteristics - non-tender.  Eye Eyeball - Left-Extraocular movements intact, No Nystagmus - Left. Eyeball - Right-Extraocular movements intact, No Nystagmus - Right. Cornea - Left-No Hazy - Left. Cornea - Right-No Hazy - Right. Sclera/Conjunctiva - Left-No scleral icterus, No Discharge - Left. Sclera/Conjunctiva - Right-No scleral icterus, No Discharge - Right. Pupil - Left-Direct reaction to light normal. Pupil - Right-Direct reaction to light normal.  ENMT Ears Pinna - Left - no drainage observed, no generalized tenderness observed. Pinna - Right - no drainage observed, no generalized tenderness observed. Nose and Sinuses External Inspection of the Nose - no destructive lesion observed. Inspection of the nares - Left - quiet respiration. Inspection of the nares - Right - quiet respiration. Mouth and Throat Lips - Upper Lip - no fissures observed, no pallor noted. Lower Lip - no fissures observed, no pallor noted. Nasopharynx - no discharge present. Oral Cavity/Oropharynx - Tongue - no dryness observed. Oral Mucosa - no cyanosis observed. Hypopharynx - no evidence of airway distress observed. Note: Hard of hearing  Chest and Lung Exam Inspection Movements - Normal and Symmetrical. Accessory muscles - No use of accessory muscles in breathing. Palpation Palpation of the chest reveals - Non-tender. Auscultation Breath sounds -  Normal and Clear.  Cardiovascular Auscultation Rhythm - Regular. Murmurs & Other Heart Sounds - Auscultation of the heart reveals - No Murmurs and No Systolic Clicks.  Abdomen Inspection Inspection of the abdomen reveals - No Visible peristalsis and No Abnormal pulsations. Umbilicus - No Bleeding, No Urine drainage. Palpation/Percussion Palpation and Percussion of the abdomen reveal - Soft, Non Tender, No Rebound tenderness, No Rigidity (guarding) and No Cutaneous hyperesthesia. Note: Abdomen soft. Morbidly obese. Moderate suprapubic umbilical diastases recti but no hernia. No umbilical hernia. Right lower quadrant vertical paramedian incision but no hernia there. Nontender. Not distended. No umbilical or incisional hernias. No guarding.  Male Genitourinary Sexual Maturity Tanner 5 - Adult hair pattern and Adult penile size and shape. Note: Right greater than left inguinal hernias. Right-sided sensitive to cough and Valsalva and does come down into the upper scrotum. Testicles larger than average on both sides very sensitive to light touch with some swelling around it. 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 Discomfort as he bulges with coughing on his right groin  Peripheral Vascular Upper Extremity Inspection - Left - No Cyanotic nailbeds - Left, Not Ischemic. Inspection - Right - No Cyanotic nailbeds - Right, Not Ischemic.  Neurologic Neurologic evaluation reveals -normal attention span and ability to concentrate, able to name objects and repeat phrases. Appropriate fund of knowledge , normal sensation and normal coordination. Mental Status Affect - not angry, not paranoid. Cranial Nerves-Normal Bilaterally. Gait-Normal.  Neuropsychiatric Mental status exam performed with findings of-able to articulate well with normal speech/language, rate, volume  and coherence, thought content normal with ability to perform basic computations and apply  abstract reasoning and no evidence of hallucinations, delusions, obsessions or homicidal/suicidal ideation.  Musculoskeletal Global Assessment Spine, Ribs and Pelvis - no instability, subluxation or laxity. Right Upper Extremity - no instability, subluxation or laxity.  Lymphatic Head & Neck  General Head & Neck Lymphatics: Bilateral - Description - No Localized lymphadenopathy. Axillary  General Axillary Region: Bilateral - Description - No Localized lymphadenopathy. Femoral & Inguinal  Generalized Femoral & Inguinal Lymphatics: Left - Description - No Localized lymphadenopathy. Right - Description - No Localized lymphadenopathy.    Assessment & Plan Andres Hector MD; 03/27/2019 5:28 PM)  BILATERAL INGUINAL HERNIA WITHOUT OBSTRUCTION OR GANGRENE, RECURRENCE NOT SPECIFIED (K40.20) Impression: Right greater than left inguinal hernias. Right-sided sensitive. No sensitive is his testicles.  Because there moderate size and seen to be more noticeable, I recommend surgical repair. Ideally laparoscopic preperitoneal approach. He suspects his wife, who is a Marine scientist, will agree with this recommendation  Usually people can go home that day. May have to watch him overnight depending. He is a diabetic that is well controlled. He has no sleep apnea. He tolerated lung wedge resection chest surgery 5 years ago without much difficulty. NO h/o urinary issues   PREOP - ING HERNIA - ENCOUNTER FOR PREOPERATIVE EXAMINATION FOR GENERAL SURGICAL PROCEDURE (Z01.818)  Current Plans You are being scheduled for surgery- Our schedulers will call you.  You should hear from our office's scheduling department within 5 working days about the location, date, and time of surgery. We try to make accommodations for patient's preferences in scheduling surgery, but sometimes the OR schedule or the surgeon's schedule prevents [place]  from making those accommodations.  If you have not heard from our office [phone]) in 5 working days, call the office and ask for your surgeon's nurse.  If you have other questions about your diagnosis, plan, or surgery, call the office and ask for your surgeon's nurse.  Written instructions provided The anatomy & physiology of the abdominal wall and pelvic floor was discussed. The pathophysiology of hernias in the inguinal and pelvic region was discussed. Natural history risks such as progressive enlargement, pain, incarceration, and strangulation was discussed. Contributors to complications such as smoking, obesity, diabetes, prior surgery, etc were discussed.  I feel the risks of no intervention will lead to serious problems that outweigh the operative risks; therefore, I recommended surgery to reduce and repair the hernia. I explained laparoscopic techniques with possible need for an open approach. I noted usual use of mesh to patch and/or buttress hernia repair  Risks such as bleeding, infection, abscess, need for further treatment, heart attack, death, and other risks were discussed. I noted a good likelihood this will help address the problem. Goals of post-operative recovery were discussed as well. Possibility that this will not correct all symptoms was explained. I stressed the importance of low-impact activity, aggressive pain control, avoiding constipation, & not pushing through pain to minimize risk of post-operative chronic pain or injury. Possibility of reherniation was discussed. We will work to minimize complications.  An educational handout further explaining the pathology & treatment options was given as well. Questions were answered. The patient expresses understanding & wishes to proceed with surgery.  Pt Education - Pamphlet Given - Laparoscopic Hernia Repair: discussed with patient and provided information. Pt Education - CCS Pain Control ([place]) Pt  Education - CCS Hernia Post-Op HCI (Ailey Wessling): discussed with patient and provided information. Pt Education - CCS Mesh education: discussed with patient and provided information.  CHRONIC LOW BACK PAIN  WITHOUT SCIATICA, UNSPECIFIED BACK PAIN LATERALITY (M54.5)  Current Plans Pt Education - Low Back Pain Exercises *: back exercises Pt Education - Low Back Pain: lower back pain  DOE (DYSPNEA ON EXERTION) (R06.00) Impression: History some dyspnea on exertion. Had an underwhelming cardiac catheterization October 2014 Dr. Ellyn Hack. Him and his numerous chronic health issues and persistent dyspnea, would like clearance for him to just make sure nothing is changed in the past 6 years.  Current Plans I recommended obtaining preoperative cardiac clearance. I am concerned about the health of the patient and the ability to tolerate the operation. Therefore, we will request clearance by cardiology to better assess operative risk & see if a reevaluation, further workup, etc is needed. Also recommendations on how medications such as for anticoagulation and blood pressure should be managed/held/restarted after surgery.  HOH (HARD OF HEARING) (H91.90)  Andres Hector, MD, FACS, MASCRS Gastrointestinal and Minimally Invasive Surgery    [address], [place], [place] [address] [phone] Main / Paging [phone] Fax

## 2019-04-06 ENCOUNTER — Other Ambulatory Visit: Payer: Self-pay | Admitting: Nurse Practitioner

## 2019-04-06 DIAGNOSIS — I10 Essential (primary) hypertension: Secondary | ICD-10-CM

## 2019-04-11 ENCOUNTER — Telehealth: Payer: Self-pay | Admitting: Nurse Practitioner

## 2019-04-11 NOTE — Telephone Encounter (Signed)
I have never prescribed albuterol solution for nebulizer machine. I sent proair in past. He was evaluated by pulmonology recently. Please have him contact pulmonology to verify if nebulizer solution is appropriate.

## 2019-04-11 NOTE — Telephone Encounter (Signed)
Dr. Ander Slade please advise, pt request refill for albuterol nebulizer solution send to [facility]. Pt is out of this medication.

## 2019-04-11 NOTE — Telephone Encounter (Signed)
Pt call requesting refill for albuterol nebulizer refill, pt stated [facility] has been trying to contact us about this. This rx d/c back in 11/2018. [place] please advise, he is out of this med.     Copied from [place] [phone]. Topic: General - Other >> Apr 11, 2019  1:02 PM Rayann Heman wrote: Reason for CRM: pt wife called and stated that she would like a call back from the nurse regarding medication

## 2019-04-12 ENCOUNTER — Other Ambulatory Visit: Payer: Self-pay | Admitting: Pulmonary Disease

## 2019-04-12 MED ORDER — ALBUTEROL SULFATE (2.5 MG/3ML) 0.083% IN NEBU: 2.5000 mg | INHALATION_SOLUTION | Freq: Four times a day (QID) | RESPIRATORY_TRACT | 1 refills | Status: DC | PRN

## 2019-04-12 NOTE — Telephone Encounter (Signed)
Left detail message inform the pt.  

## 2019-04-12 NOTE — Telephone Encounter (Signed)
Albuterol nebulization solution sent to pharmacy

## 2019-04-13 ENCOUNTER — Other Ambulatory Visit: Payer: Self-pay | Admitting: Nurse Practitioner

## 2019-04-13 NOTE — Telephone Encounter (Signed)
[facility] stated they never got the one we send in 02/14/2019. Rx resend.

## 2019-04-17 ENCOUNTER — Encounter: Payer: Self-pay | Admitting: Cardiology

## 2019-04-19 ENCOUNTER — Ambulatory Visit (INDEPENDENT_AMBULATORY_CARE_PROVIDER_SITE_OTHER): Payer: Medicare Other | Admitting: Cardiology

## 2019-04-19 ENCOUNTER — Other Ambulatory Visit: Payer: Self-pay

## 2019-04-19 ENCOUNTER — Ambulatory Visit (INDEPENDENT_AMBULATORY_CARE_PROVIDER_SITE_OTHER): Payer: Medicare Other | Admitting: Gastroenterology

## 2019-04-19 VITALS — BP 142/83 | HR 85 | Ht 67.5 in | Wt 240.0 lb

## 2019-04-19 VITALS — BP 122/72 | HR 75 | Temp 97.2°F | Ht 67.5 in | Wt 239.0 lb

## 2019-04-19 DIAGNOSIS — J984 Other disorders of lung: Secondary | ICD-10-CM | POA: Diagnosis not present

## 2019-04-19 DIAGNOSIS — I272 Pulmonary hypertension, unspecified: Secondary | ICD-10-CM | POA: Diagnosis not present

## 2019-04-19 DIAGNOSIS — I10 Essential (primary) hypertension: Secondary | ICD-10-CM

## 2019-04-19 DIAGNOSIS — R195 Other fecal abnormalities: Secondary | ICD-10-CM | POA: Diagnosis not present

## 2019-04-19 DIAGNOSIS — Z01810 Encounter for preprocedural cardiovascular examination: Secondary | ICD-10-CM | POA: Diagnosis not present

## 2019-04-19 DIAGNOSIS — R0600 Dyspnea, unspecified: Secondary | ICD-10-CM

## 2019-04-19 DIAGNOSIS — E782 Mixed hyperlipidemia: Secondary | ICD-10-CM | POA: Diagnosis not present

## 2019-04-19 DIAGNOSIS — R140 Abdominal distension (gaseous): Secondary | ICD-10-CM

## 2019-04-19 DIAGNOSIS — R0609 Other forms of dyspnea: Secondary | ICD-10-CM

## 2019-04-19 MED ORDER — METOPROLOL TARTRATE 50 MG PO TABS: 50.0000 mg | ORAL_TABLET | Freq: Once | ORAL | 0 refills | Status: DC

## 2019-04-19 NOTE — Patient Instructions (Addendum)
Medication Instructions:   SEE INSTRUCTIONS FOR CCTA  Lab work: SEE INSTRUCTION SHEET FOR CCTA   Testing/Procedures: WILL BE  A SCHEDULE  AT [place] [facility] Your physician has requested that you have an echocardiogram. Echocardiography is a painless test that uses sound waves to create images of your heart. It provides your doctor with information about the size and shape of your heart and how well your heart's chambers and valves are working. This procedure takes approximately one hour. There are no restrictions for this procedure. AND   WILL BE SCHEDULE AT [facility] - ONCE WITH PARIOR AUTHORIZATION IS APPROVED Your physician has requested that you have cardiac CT. Cardiac computed tomography (CT) is a painless test that uses an x-ray machine to take clear, detailed pictures of your heart. For further information please visit [facility]. Please follow instruction sheet as given.    Follow-Up: At [facility], you and your health needs are our priority.  As part of our continuing mission to provide you with exceptional heart care, we have created designated Provider Care Teams.  These Care Teams include your primary Cardiologist (physician) and Advanced Practice Providers (APPs -  Physician Assistants and Nurse Practitioners) who all work together to provide you with the care you need, when you need it. . You will need a follow up appointment in  2 months.  Please call our office 2 months in advance to schedule this appointment.  You may see Glenetta Hew, MD or one of the following Advanced Practice Providers on your designated Care Team:   . Rosaria Ferries, PA-C . Jory Sims, DNP, ANP  Any Other Special Instructions Will Be Listed Below (If Applicable).    Your cardiac CT will be scheduled at one of the below locations:   [facility] [address] [place], [place] [address] [phone]    If scheduled at [facility], please arrive at the [facility] main entrance of [facility] 30-45 minutes prior to test start time. Proceed to the [facility] Radiology Department (first floor) to check-in and test prep.    Please follow these instructions carefully (unless otherwise directed):  Hold all erectile dysfunction medications at least 3 days (72 hrs) prior to test. PLEASE HAVE LAB WORK DONE AT LEAST ONE WEEK BEFORE TEST.   On the Night Before the Test: . Be sure to Drink plenty of water. . Do not consume any caffeinated/decaffeinated beverages or chocolate 12 hours prior to your test. . Do not take any antihistamines 12 hours prior to your test. .   On the Day of the Test: . Drink plenty of water. Do not drink any water within one hour of the test. . Do not eat any food 4 hours prior to the test. . You may take your regular medications prior to the test.  . Take metoprolol (Lopressor) 100 MG  two hours prior to test. . HOLD Furosemide/Hydrochlorothiazide morning of the test. .       After the Test: . Drink plenty of water. . After receiving IV contrast, you may experience a mild flushed feeling. This is normal. . On occasion, you may experience a mild rash up to 24 hours after the test. This is not dangerous. If this occurs, you can take Benadryl 25 mg and increase your fluid intake. . If you experience trouble breathing, this can be serious. If it is severe call 911 IMMEDIATELY. If it is mild, please call our office. [name]  If you take any of these medications: Glipizide/Metformin, Avandament, Glucavance, please do not take 48 hours after completing test unless otherwise instructed.    Please contact the cardiac imaging nurse navigator should you have any questions/concerns Marchia Bond, RN Navigator Cardiac Imaging [place] [facility] [phone] Office  [phone] Cell

## 2019-04-19 NOTE — Progress Notes (Signed)
PCP: [name], NP  Clinic Note: Chief Complaint  Patient presents with   New Patient (Initial Visit)    Last seen in October 2014   Shortness of Breath   Pre-op Exam    Inguinal hernia surgery repair    HPI: Andres Taylor is a 73 y.o. morbidly obese male with hypertension, hyperlipidemia and diabetes mellitus-2 along with ?COPD from longstanding history of smoking who is being seen today for the evaluation of progressively worsening shortness of breath with exertion and fatigue with need for potential preop evaluation at the request of Nche, Charlene Brooke, NP.  Andres Taylor was last seen in October 2014 as part of preop assessment for right upper lobe lobectomy by Dr. Roxan Hockey.  This was also for worsening exertional dyspnea and lower extremity edema.  Echocardiogram was relatively normal at the time.  He was getting short of breath barely walking around his house. ->  In order to expedite preoperative assessment, we proceeded with right and left heart catheterization  Recent Hospitalizations: None  Studies Personally Reviewed - (if available, images/films reviewed: From Epic Chart or Care Everywhere)  R LHC April 20, 2013: Angiographically normal Coronary Arteries.  Large-caliber LAD wraps apex.  3 small diagonals.  Large caliber LCx bifurcates into the lateral OM1 and ABG LCx with small OM-PL branches prior to bifurcating into LPL and L PDA.  Nondominant RCA.  Mildly elevated LVEDP & borderline CO/CI-4.94/2.16 by both Fick and thermodilution.  RAP elevated at 14 mmHg, RVEDP 15 mmHg.  LVEDP 19 mmHg (suggest diastolic dysfunction) PCWP only 16 mmHg, but PA Mean 25 mmHg  PFTs March 13, 2019: FVC 1.83 (47% predicted - pred), FEV1 1.52 (54% pred), FEV1/FVC % 83.  SVC 1.98 (51% pred), IC 1.9 (61% Pred) RV 2.65 (110% Pred) TLC 4.63 (70% Pred).  DLCO 14.8 (63% Pred) --> normal airway resistance.  Reduced lung volumes.  No bronchodilator response.  Reduced DLCO  equal moderate loss of functional alveolar surface (not corrected for hemoglobin). -->  Recommend studies with exercise.  Interval History: Andres Taylor is returning here today after 6 years indicating that he had again same thing that was a noticing 6 years ago, he has profound exertional dyspnea and fatigue.  He is got the point where he really cannot do much activity at all without just tiring so easily getting short of breath.  He was "huffing and puffing "just walking back to the clinic room.  He says sometimes when he is breathing hard he may notice some chest discomfort, but otherwise denies any chest pain.  He says he has some occasional wheezing and coughing that seems related to allergies, but has not really noted this level of dyspnea since 2014.  No dyspnea or chest discomfort with rest.  He denies real PND orthopnea, but does have some mild edema.  No palpitations, lightheadedness, dizziness, weakness or syncope/near syncope. No TIA/amaurosis fugax symptoms. No melena, hematochezia, hematuria, or epstaxis. No claudication.  ROS: A comprehensive was performed. Review of Systems  Constitutional: Positive for malaise/fatigue (More easily fatigued). Negative for weight loss (In fact he is probably gained weight in the COVID-19 timeframe).  HENT: Positive for congestion (Some mild congestion and rhinorrhea with allergies). Negative for nosebleeds.   Respiratory: Positive for cough, shortness of breath (Per HPI) and wheezing.        Per HPI likely related to allergies  Gastrointestinal: Positive for heartburn. Negative for constipation.  Musculoskeletal: Positive for joint pain. Negative for falls and myalgias.  Neurological: Negative for dizziness, focal weakness and headaches.  Psychiatric/Behavioral: Negative for depression. The patient does not have insomnia.        Epworth score is roughly 10.  May need to consider OSA evaluation, but will defer to pulmonary    I have reviewed and (if  needed) personally updated the patient's problem list, medications, allergies, past medical and surgical history, social and family history.   Past Medical History:  Diagnosis Date   Allergic rhinitis    Allergy    Arthritis    Blood in urine    Calculus of kidney    DDD (degenerative disc disease)    Diabetes mellitus ([place])    Eustachian tube dysfunction    Fatty liver    Gout    Hyperlipidemia    Hypertension    Joint pain, knee    Leg edema    Obesity    Pneumonitis    right lung   Sciatica    Spinal stenosis     Past Surgical History:  Procedure Laterality Date   APPENDECTOMY  1976   BACK SURGERY     COLONOSCOPY  08/2010   HEMORRHOID SURGERY  2001   HERNIA REPAIR     as a baby 26 months old   LEFT AND RIGHT HEART CATHETERIZATION WITH CORONARY ANGIOGRAM N/A 04/20/2013   Procedure: LEFT AND RIGHT HEART CATHETERIZATION WITH CORONARY ANGIOGRAM;  Surgeon: Leonie Man, MD;  Location: [facility] CATH LAB;  Angiographically normal Coronary Arteries - L Dominant Cx & wraparound LAD.  Borderline CO/CI - 4.94/2.16 by both Fick & TD.  RAP elevated at 14 mmHg, RVEDP 15 mmHg.  LVEDP 19 mmHg (suggest diastolic dysfunction) PCWP only 16 mmHg, but PA Mean 25 mmHg   LITHOTRIPSY     x 2   LOBECTOMY Right    lumbar disectomy L4-5  1980   TRANSTHORACIC ECHOCARDIOGRAM  02/2013   EF 65-70%, moderate LVH (basal septal hypertrophy); normal wall motion; mild LA Dilation; PA pressures 47 mmHg   VIDEO ASSISTED THORACOSCOPY (VATS)/WEDGE RESECTION Right 08/30/2013   Procedure: VIDEO ASSISTED THORACOSCOPY (VATS)/WEDGE RESECTION;  Surgeon: Melrose Nakayama, MD;  Location: [facility] OR;  Service: Thoracic;  Laterality: Right;    Current Meds  Medication Sig   albuterol (PROVENTIL) (2.5 MG/3ML) 0.083% nebulizer solution Take 3 mLs (2.5 mg total) by nebulization every 6 (six) hours as needed for wheezing or shortness of breath.   allopurinol (ZYLOPRIM) 100 MG tablet Take 1  tablet by mouth daily   aspirin EC 81 MG tablet Take 81 mg by mouth daily.   atorvastatin (LIPITOR) 10 MG tablet Take 1 tablet (10 mg total) by mouth daily at 6 PM.   BREO ELLIPTA 200-25 MCG/INH AEPB INHALE 1 PUFF INTO THE LUNGS DAILY   cetirizine (ZYRTEC) 10 MG chewable tablet Chew 10 mg by mouth daily.   colchicine 0.6 MG tablet Take 0.6 mg by mouth 2 (two) times daily as needed (for gout). PRN for Gout   docusate sodium (COLACE) 100 MG capsule Take 1 capsule (100 mg total) by mouth daily. (Patient taking differently: Take 100 mg by mouth as needed. )   fluticasone (FLONASE) 50 MCG/ACT nasal spray as needed.    guaiFENesin (MUCINEX) 600 MG 12 hr tablet Take 600 mg by mouth daily.   hydrochlorothiazide (MICROZIDE) 12.5 MG capsule TAKE 1 CAPSULE(12.5 MG) BY MOUTH DAILY   ipratropium (ATROVENT) 0.06 % nasal spray Place 2 sprays into the nose 4 (four) times daily.   lisinopril (ZESTRIL)  20 MG tablet TAKE 1 TABLET(20 MG) BY MOUTH DAILY   metFORMIN (GLUCOPHAGE) 850 MG tablet TAKE 1 TABLET(850 MG) BY MOUTH TWICE DAILY WITH A MEAL   methocarbamol (ROBAXIN) 500 MG tablet TAKE 1 TABLET(500 MG) BY MOUTH EVERY 8 HOURS AS NEEDED FOR MUSCLE SPASMS   montelukast (SINGULAIR) 10 MG tablet Take 1 tablet (10 mg total) by mouth at bedtime. (Patient taking differently: Take 10 mg by mouth as needed. )   Multiple Vitamin (MULTIVITAMIN) tablet Take 1 tablet by mouth daily.   omega-3 acid ethyl esters (LOVAZA) 1 g capsule TAKE 2 CAPSULES BY MOUTH TWICE DAILY   pantoprazole (PROTONIX) 20 MG tablet Take 1 tablet (20 mg total) by mouth 2 (two) times daily.   PROAIR RESPICLICK 123XX123 (90 Base) MCG/ACT AEPB INHALE 2 PUFFS EVERY 4-6 HOURS AS NEEDED FOR COUGH/ WHEEZE    No Known Allergies  Social History   Tobacco Use   Smoking status: Former Smoker    Packs/day: 1.50    Years: 42.00    Pack years: 63.00    Types: Cigarettes    Quit date: 07/13/2002    Years since quitting: 16.7   Smokeless  tobacco: Never Used  Substance Use Topics   Alcohol use: Yes    Alcohol/week: 3.0 standard drinks    Types: 3 Cans of beer per week    Comment: 3 tmes weekly   Drug use: No   Social History   Social History Narrative   He and his first wife divorced in 20 14/2015.  He is now remarried and lives with his new wife.   He has 2 adult children-aged 28 and 37 (he had 3 children, 1 of his sons--actually the twin brother of 21 of the existing sons, died after complications of redo heart transplant following Adriamycin toxicity from leukemia as a child, he died at age 64.     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   He quit smoking in 2004 after 50+ pack year smoking history.  He occasionally drinks alcohol.    family history includes AAA (abdominal aortic aneurysm) in his son; Breast cancer in his maternal grandmother; Cancer in his maternal grandmother and son; Heart failure in his son; Hypertension in his maternal grandfather; Leukemia in his father; Stroke in his maternal grandfather, maternal grandmother, mother, and paternal grandmother.  Wt Readings from Last 3 Encounters:  04/19/19 240 lb (108.9 kg)  04/19/19 239 lb (108.4 kg)  03/17/19 235 lb 12.8 oz (107 kg)    PHYSICAL EXAM BP (!) 142/83    Pulse 85    Ht 5' 7.5" (1.715 m)    Wt 240 lb (108.9 kg)    SpO2 95%    BMI 37.03 kg/m  Physical Exam  Constitutional: He appears well-developed and well-nourished. No distress.  Essentially morbidly obese with BMI of 37 and cardiovascular risk factors of hypertension hyperlipidemia and diabetes.  Well-groomed.  Otherwise healthy-appearing  HENT:  Head: Normocephalic and atraumatic.  Eyes: Conjunctivae and EOM are normal.  Neck: Normal range of motion. Neck supple. No hepatojugular reflux (Cannot assess) and no JVD (Very difficult to assess due to body habitus) present. Carotid bruit is not present.  Cardiovascular: Normal rate, regular rhythm, S1 normal and S2 normal.   Occasional extrasystoles are present. PMI is not displaced (Cannot palpate). Exam reveals distant heart sounds and decreased pulses (Mildly decreased pedal pulses due to mild trace edema and body habitus). Exam reveals no gallop and no S4.  No murmur heard.  Vitals reviewed.    Adult ECG Report  Rate: 983;  Rhythm: normal sinus rhythm and Normal axis, intervals and durations;   Narrative Interpretation: Normal EKG  Other studies Reviewed: Additional studies/ records that were reviewed today include:  Recent Labs:   Lab Results  Component Value Date   CHOL 115 12/06/2018   HDL 34.40 (L) 12/06/2018   LDLCALC 51 12/06/2018   LDLDIRECT 132.0 09/06/2018   TRIG 149.0 12/06/2018   CHOLHDL 3 12/06/2018   Lab Results  Component Value Date   CREATININE 1.12 03/13/2019   BUN 23 03/13/2019   NA 137 03/13/2019   K 5.2 (H) 03/13/2019   CL 98 03/13/2019   CO2 31 03/13/2019   Lab Results  Component Value Date   HGBA1C 6.9 (H) 12/06/2018    ASSESSMENT / PLAN: Problem List Items Addressed This Visit    Hyperlipidemia - well-controlled (Chronic)   HTN (hypertension), benign (Chronic)    Borderline blood pressure today on current dose of lisinopril and HCTZ.  Monitor for now.      Relevant Orders   ECHOCARDIOGRAM COMPLETE   CT CORONARY MORPH W/CTA COR W/SCORE W/CA W/CM &/OR WO/CM   CT CORONARY FRACTIONAL FLOW RESERVE DATA PREP   CT CORONARY FRACTIONAL FLOW RESERVE FLUID ANALYSIS   Severe obesity (BMI >= 40) (HCC) (Chronic)    He continues to dyspnea from a standpoint of supply demand mismatch, restrictive lung disease/OHS.  Associated with diabetes and hypertension, but as part of the metabolic syndrome.  We will evaluate for coronary artery disease with coronary calcium score-coronary CT angiogram with possible FFR.      Pulmonary HTN (HCC) (Chronic)    Elevated PA pressures estimated on echocardiogram back in 2014.  Probably related to obesity, OHS.  He does have an Epworth  score of 10 indicating possible undiagnosed OSA.  Plan: Recheck 2D echocardiogram as a source of possible dyspnea.  If coronary CT angiogram does show significant disease, and cardiac cath was recommended, would recommend right and left heart cath.  Based on echo results, may want to consider adding calcium channel blocker for pulmonary artery vasodilation.      Relevant Orders   EKG 12-Lead   ECHOCARDIOGRAM COMPLETE   Basic metabolic panel   CT CORONARY MORPH W/CTA COR W/SCORE W/CA W/CM &/OR WO/CM   CT CORONARY FRACTIONAL FLOW RESERVE DATA PREP   CT CORONARY FRACTIONAL FLOW RESERVE FLUID ANALYSIS   Restrictive lung disease (Chronic)    Somewhat restrictive disease describes 2019.  Probably related to obesity.  This could be contributing to his dyspnea along and contributing to elevated pulmonary pressures.  He needs to lose weight.  Needs to exercise.  We will evaluate for ischemia first.      Dyspnea on exertion - Primary    Interesting, the symptoms are very similar to when I saw him 6 years ago.  At this point with an upcoming surgery scheduled, we need to get a quick answer. Plan: Check 2D echocardiogram and coronary artery calcium score-coronary CT angiogram. Coronary CTA allows [place] to evaluate both anatomic findings as well as potential physiologic findings with CT FFR.  Further plans based on this. Otherwise need to continue to titrate aggressive risk factor modification and blood pressure control.      Relevant Orders   EKG 12-Lead   ECHOCARDIOGRAM COMPLETE   Basic metabolic panel   CT CORONARY MORPH W/CTA COR W/SCORE W/CA W/CM &/OR WO/CM   CT CORONARY FRACTIONAL FLOW RESERVE DATA PREP  CT CORONARY FRACTIONAL FLOW RESERVE FLUID ANALYSIS   Pre-operative cardiovascular examination    Thankfully, inguinal surgery is relatively low risk.  However, with him having active progressively worsening exertional dyspnea, we do need to exclude an ischemic etiology.  We will also  evaluate cardiac functional capacity with an echocardiogram. Plan: Check 2D echocardiogram and coronary calcium score-coronary CT angiogram with possible CT FFR.  Make further conditions based on these results.  Factive coronary CT angiogram does not show significant disease, would not recommend additional evaluation.      Relevant Orders   EKG 12-Lead   ECHOCARDIOGRAM COMPLETE   Basic metabolic panel   CT CORONARY MORPH W/CTA COR W/SCORE W/CA W/CM &/OR WO/CM   CT CORONARY FRACTIONAL FLOW RESERVE DATA PREP   CT CORONARY FRACTIONAL FLOW RESERVE FLUID ANALYSIS      I spent a total of 35-40 minutes with the patient and chart review. >  50% of the time was spent in direct patient consultation.   Current medicines are reviewed at length with the patient today.  (+/- concerns) n/a  Patient Instructions  Medication Instructions:   SEE INSTRUCTIONS FOR CCTA  Lab work: SEE INSTRUCTION SHEET FOR CCTA   Testing/Procedures: WILL BE  A SCHEDULE  AT [facility] Your physician has requested that you have an echocardiogram. Echocardiography is a painless test that uses sound waves to create images of your heart. It provides your doctor with information about the size and shape of your heart and how well your hearts chambers and valves are working. This procedure takes approximately one hour. There are no restrictions for this procedure. AND   WILL BE SCHEDULE AT [facility] - ONCE WITH PARIOR AUTHORIZATION IS APPROVED Your physician has requested that you have cardiac CT. Cardiac computed tomography (CT) is a painless test that uses an x-ray machine to take clear, detailed pictures of your heart. For further information please visit [email or web address]. Please follow instruction sheet as given.    Follow-Up: At [facility], you and your health needs are our priority.  As part of our continuing mission to provide you with exceptional heart care, we have created  designated Provider Care Teams.  These Care Teams include your primary Cardiologist (physician) and Advanced Practice Providers (APPs -  Physician Assistants and Nurse Practitioners) who all work together to provide you with the care you need, when you need it.  You will need a follow up appointment in  2 months.  Please call our office 2 months in advance to schedule this appointment.  You may see Glenetta Hew, MD or one of the following Advanced Practice Providers on your designated Care Team:    Rosaria Ferries, PA-C  Jory Sims, DNP, ANP  Any Other Special Instructions Will Be Listed Below (If Applicable).    Your cardiac CT will be scheduled at one of the below locations:   [facility] [address] [place], [place] [address] [phone]    If scheduled at [facility], please arrive at the [facility] main entrance of [facility] 30-45 minutes prior to test start time. Proceed to the [facility] Radiology Department (first floor) to check-in and test prep.    Please follow these instructions carefully (unless otherwise directed):  Hold all erectile dysfunction medications at least 3 days (72 hrs) prior to test. PLEASE HAVE LAB WORK DONE AT LEAST ONE WEEK BEFORE TEST.   On the Night Before the Test:  Be sure  to Drink plenty of water.  Do not consume any caffeinated/decaffeinated beverages or chocolate 12 hours prior to your test.  Do not take any antihistamines 12 hours prior to your test.    On the Day of the Test:  Drink plenty of water. Do not drink any water within one hour of the test.  Do not eat any food 4 hours prior to the test.  You may take your regular medications prior to the test.   Take metoprolol (Lopressor) 100 MG  two hours prior to test.  HOLD Furosemide/Hydrochlorothiazide morning of the test.        After the Test:  Drink plenty of water.  After receiving IV contrast, you may experience a mild  flushed feeling. This is normal.  On occasion, you may experience a mild rash up to 24 hours after the test. This is not dangerous. If this occurs, you can take Benadryl 25 mg and increase your fluid intake.  If you experience trouble breathing, this can be serious. If it is severe call 911 IMMEDIATELY. If it is mild, please call our office.  If you take any of these medications: Glipizide/Metformin, Avandament, Glucavance, please do not take 48 hours after completing test unless otherwise instructed.    Please contact the cardiac imaging nurse navigator should you have any questions/concerns Marchia Bond, RN Navigator Cardiac Imaging [facility] [phone] Office  [phone] Cell         Studies Ordered:   Orders Placed This Encounter  Procedures   CT CORONARY MORPH W/CTA COR W/SCORE W/CA W/CM &/OR WO/CM   CT CORONARY FRACTIONAL FLOW RESERVE DATA PREP   CT CORONARY FRACTIONAL FLOW RESERVE FLUID ANALYSIS   Basic metabolic panel   EKG [id number]   ECHOCARDIOGRAM COMPLETE      Glenetta Hew, M.D., M.S. Interventional Cardiologist   Pager # [phone] Phone # [phone] [address] [place], [place] [address]   Thank you for choosing Heartcare at [facility]!!

## 2019-04-19 NOTE — Progress Notes (Signed)
HPI: This is a very pleasant 73 year old man who was referred to me by Nche, Charlene Brooke, NP  to evaluate abdominal pains, melenic stool, heme positive.    About a month ago he stopped taking some muscle relaxer for his chronic low back pain and also stopped diclofenac.  He was still having his usual low back pains and so he started taking Aleve 1 pill twice daily for about 10 days.  Towards the end of that he began having abdominal pains, burning in his epigastrium any past 2 or 3 very dark-colored stools.  He was not taking Pepto-Bismol.  He went to his primary care physician and underwent blood testing and CAT scan.  See those results summarized below.  He was also found to have Hemoccult positive stool.  He was told to stop taking Aleve and he was started on 20 mg of Protonix once daily which he has been very good about taking.  The pains have all completely resolved.  He has lost about 10 pounds over the past year without intention.  He has seen a surgeon about his inguinal hernia and they are planning to repair it after cardiology clearance.  He sees a cardiologist later today.    Old Data Reviewed: Colonoscopy 2012 in [facility], Dr. Dorrene German 3 subcentimeter polyps were removed, pathology showed these were hyperplastic  Blood work March 13, 2019 show hemoglobin of 12.5, normal complete metabolic profile except for a potassium of 5.2  Stool Hemoccult testing February 2019 was negative done by his primary care team.  He was seen August 31 through the by her primary care with complaints of abdominal bloating, discomfort with a "dark stool".  At the time of that examination he underwent a digital rectal examination and was found to have Hemoccult positive stool.   CT scan abdomen pelvis with IV and oral contrast September 2020 done for "abdominal pain, gastroenteritis or colitis suspected, right sided abdominal pains, abdominal distention, heme positive stool, possible hernia" showed a  "moderate right-sided inguinal hernia which contains primarily fat although a small portion of the bladder dome.  No bowel involvement.  There is a small left sided fat-containing inguinal hernia.    Review of systems: Pertinent positive and negative review of systems were noted in the above HPI section. All other review negative.   Past Medical History:  Diagnosis Date  . Allergic rhinitis   . Allergy   . Arthritis   . Blood in urine   . Calculus of kidney   . DDD (degenerative disc disease)   . Diabetes mellitus ([place])   . Eustachian tube dysfunction   . Fatty liver   . Gout   . Hyperlipidemia   . Hypertension   . Joint pain, knee   . Leg edema   . Obesity   . Pneumonitis    right lung  . Sciatica   . Shortness of breath    with minimal exertion  . Spinal stenosis     Past Surgical History:  Procedure Laterality Date  . APPENDECTOMY  1976  . BACK SURGERY    . COLONOSCOPY  08/2010  . [place]  2001  . HERNIA REPAIR     as a baby 33 months old  . LEFT AND RIGHT HEART CATHETERIZATION WITH CORONARY ANGIOGRAM N/A 04/20/2013   Procedure: LEFT AND RIGHT HEART CATHETERIZATION WITH CORONARY ANGIOGRAM;  Surgeon: Leonie Man, MD;  Location: [facility] CATH LAB;  Angiographically normal Coronary Arteries.  Mildly elevated LVEDP &  borderline CO/CI.   [name] LITHOTRIPSY     x 2  . LOBECTOMY Right   . lumbar disectomy L4-5  1980  . TRANSTHORACIC ECHOCARDIOGRAM  02/2013   EF 65-70%, moderate LVH (basal septal hypertrophy); normal wall motion; mild LA Dilation; PA pressures 47 mmHg  . VIDEO ASSISTED THORACOSCOPY (VATS)/WEDGE RESECTION Right 08/30/2013   Procedure: VIDEO ASSISTED THORACOSCOPY (VATS)/WEDGE RESECTION;  Surgeon: Melrose Nakayama, MD;  Location: [facility];  Service: Thoracic;  Laterality: Right;    Current Outpatient Medications  Medication Sig Dispense Refill  . albuterol (PROVENTIL) (2.5 MG/3ML) 0.083% nebulizer solution Take 3 mLs (2.5 mg total) by nebulization  every 6 (six) hours as needed for wheezing or shortness of breath. 360 mL 1  . allopurinol (ZYLOPRIM) 100 MG tablet Take 1 tablet by mouth daily 90 tablet 1  . aspirin EC 81 MG tablet Take 81 mg by mouth daily.    [name] atorvastatin (LIPITOR) 10 MG tablet Take 1 tablet (10 mg total) by mouth daily at 6 PM. 90 tablet 3  . BREO ELLIPTA 200-25 MCG/INH AEPB INHALE 1 PUFF INTO THE LUNGS DAILY 1 each 5  . cetirizine (ZYRTEC) 10 MG chewable tablet Chew 10 mg by mouth daily.    . colchicine 0.6 MG tablet Take 0.6 mg by mouth 2 (two) times daily as needed (for gout). PRN for Gout    . docusate sodium (COLACE) 100 MG capsule Take 1 capsule (100 mg total) by mouth daily. (Patient taking differently: Take 100 mg by mouth as needed. ) 30 capsule 0  . fluticasone (FLONASE) 50 MCG/ACT nasal spray as needed.   5  . guaiFENesin (MUCINEX) 600 MG 12 hr tablet Take 600 mg by mouth daily.    . hydrochlorothiazide (MICROZIDE) 12.5 MG capsule TAKE 1 CAPSULE(12.5 MG) BY MOUTH DAILY 90 capsule 0  . ipratropium (ATROVENT) 0.06 % nasal spray Place 2 sprays into the nose 4 (four) times daily. 30 mL 3  . lisinopril (ZESTRIL) 20 MG tablet TAKE 1 TABLET(20 MG) BY MOUTH DAILY 90 tablet 0  . metFORMIN (GLUCOPHAGE) 850 MG tablet TAKE 1 TABLET(850 MG) BY MOUTH TWICE DAILY WITH A MEAL 180 tablet 0  . methocarbamol (ROBAXIN) 500 MG tablet TAKE 1 TABLET(500 MG) BY MOUTH EVERY 8 HOURS AS NEEDED FOR MUSCLE SPASMS 90 tablet 1  . montelukast (SINGULAIR) 10 MG tablet Take 1 tablet (10 mg total) by mouth at bedtime. (Patient taking differently: Take 10 mg by mouth as needed. ) 90 tablet 3  . Multiple Vitamin (MULTIVITAMIN) tablet Take 1 tablet by mouth daily.    [name] omega-3 acid ethyl esters (LOVAZA) 1 g capsule TAKE 2 CAPSULES BY MOUTH TWICE DAILY 240 capsule 2  . pantoprazole (PROTONIX) 20 MG tablet Take 1 tablet (20 mg total) by mouth 2 (two) times daily. 60 tablet 1  . PROAIR RESPICLICK 123XX123 (90 Base) MCG/ACT AEPB INHALE 2 PUFFS EVERY 4-6  HOURS AS NEEDED FOR COUGH/ WHEEZE 1 each 1   No current facility-administered medications for this visit.     Allergies as of 04/19/2019  . (No Known Allergies)    Family History  Problem Relation Age of Onset  . Leukemia Father   . Breast cancer Maternal Grandmother   . Cancer Maternal Grandmother        breast cancer  . Stroke Maternal Grandmother   . Hypertension Maternal Grandfather   . Stroke Maternal Grandfather   . Stroke Mother   . Cancer Son  Ewing's sarcoma  . Heart attack Son   . Stroke Paternal Grandmother   . Colon cancer Neg Hx   . Esophageal cancer Neg Hx     Social History   Socioeconomic History  . Marital status: Married    Spouse name: Not on file  . Number of children: 3  . Years of education: Not on file  . Highest education level: Not on file  Occupational History  . Occupation: Ecologist  Social Needs  . Financial resource strain: Not on file  . Food insecurity    Worry: Not on file    Inability: Not on file  . Transportation needs    Medical: Not on file    Non-medical: Not on file  Tobacco Use  . Smoking status: Former Smoker    Packs/day: 1.50    Years: 42.00    Pack years: 63.00    Types: Cigarettes    Quit date: 07/13/2002    Years since quitting: 16.7  . Smokeless tobacco: Never Used  Substance and Sexual Activity  . Alcohol use: Yes    Alcohol/week: 3.0 standard drinks    Types: 3 Cans of beer per week    Comment: 3 tmes weekly  . Drug use: No  . Sexual activity: Not on file  Lifestyle  . Physical activity    Days per week: Not on file    Minutes per session: Not on file  . Stress: Not on file  Relationships  . Social Herbalist on phone: Not on file    Gets together: Not on file    Attends religious service: Not on file    Active member of club or organization: Not on file    Attends meetings of clubs or organizations: Not on file    Relationship status: Not on file  . Intimate partner  violence    Fear of current or ex partner: Not on file    Emotionally abused: Not on file    Physically abused: Not on file    Forced sexual activity: Not on file  Other Topics Concern  . Not on file  Social History Narrative   He is process separation process/divorce.  He has 2 adult children   He lives alone in [place].  He works in Therapist, sports.   He quit smoking in 2004 after 50+ pack year smoking history.  He occasionally drinks alcohol.     Physical Exam: BP 122/72   Pulse 75   Temp (!) 97.2 F (36.2 C)   Ht 5' 7.5" (1.715 m)   Wt 239 lb (108.4 kg)   BMI 36.88 kg/m  Constitutional: generally well-appearing Psychiatric: alert and oriented x3 Eyes: extraocular movements intact Mouth: oral pharynx moist, no lesions Neck: supple no lymphadenopathy Cardiovascular: heart regular rate and rhythm Lungs: clear to auscultation bilaterally Abdomen: soft, nontender, nondistended, no obvious ascites, no peritoneal signs, normal bowel sounds Extremities: no lower extremity edema bilaterally Skin: no lesions on visible extremities   Assessment and plan: 73 y.o. male with abdominal pain, dark stools, unintentional weight loss  I think he probably was having NSAID related issues with his upper GI tract that led to his symptoms of abdominal pain, burning, black stools.  Those symptoms have completely resolved since he stopped taking Aleve and started proton pump inhibitor 20 mg Protonix once daily.  I recommended we proceed with upper endoscopy and colonoscopy, it has been about 8-1/2 years since his last one, at  his soonest convenience to exclude other potential causes such as neoplasm.  I think those are unlikely.  He is seeing a cardiologist today to clear him for a inguinal hernia repair surgery.  He is going to call us after he is finished with his cardiology testing and at that point we will schedule him for the procedures mentioned above.    Please see the  "Patient Instructions" section for addition details about the plan.   Owens Loffler, MD [facility] 04/19/2019, 10:25 AM  Cc: [name], NP

## 2019-04-19 NOTE — Patient Instructions (Signed)
We will wait to hear form you after you see your cardiologist. At that time we will schedule you for for procedures.  Thank you for entrusting me with your care and choosing [facility].  Dr Ardis Hughs

## 2019-04-20 ENCOUNTER — Encounter: Payer: Self-pay | Admitting: Cardiology

## 2019-04-20 NOTE — Assessment & Plan Note (Signed)
Somewhat restrictive disease describes 2019.  Probably related to obesity.  This could be contributing to his dyspnea along and contributing to elevated pulmonary pressures.  He needs to lose weight.  Needs to exercise.  We will evaluate for ischemia first.

## 2019-04-20 NOTE — Assessment & Plan Note (Signed)
Borderline blood pressure today on current dose of lisinopril and HCTZ.  Monitor for now.

## 2019-04-20 NOTE — Assessment & Plan Note (Signed)
Elevated PA pressures estimated on echocardiogram back in 2014.  Probably related to obesity, OHS.  He does have an Epworth score of 10 indicating possible undiagnosed OSA.  Plan: Recheck 2D echocardiogram as a source of possible dyspnea.  If coronary CT angiogram does show significant disease, and cardiac cath was recommended, would recommend right and left heart cath.  Based on echo results, may want to consider adding calcium channel blocker for pulmonary artery vasodilation.

## 2019-04-20 NOTE — Assessment & Plan Note (Signed)
Thankfully, inguinal surgery is relatively low risk.  However, with him having active progressively worsening exertional dyspnea, we do need to exclude an ischemic etiology.  We will also evaluate cardiac functional capacity with an echocardiogram. Plan: Check 2D echocardiogram and coronary calcium score-coronary CT angiogram with possible CT FFR.  Make further conditions based on these results.  Factive coronary CT angiogram does not show significant disease, would not recommend additional evaluation.

## 2019-04-20 NOTE — Assessment & Plan Note (Signed)
Interesting, the symptoms are very similar to when I saw him 6 years ago.  At this point with an upcoming surgery scheduled, we need to get a quick answer. Plan: Check 2D echocardiogram and coronary artery calcium score-coronary CT angiogram. Coronary CTA allows [place] to evaluate both anatomic findings as well as potential physiologic findings with CT FFR.  Further plans based on this. Otherwise need to continue to titrate aggressive risk factor modification and blood pressure control.

## 2019-04-20 NOTE — Assessment & Plan Note (Signed)
He continues to dyspnea from a standpoint of supply demand mismatch, restrictive lung disease/OHS.  Associated with diabetes and hypertension, but as part of the metabolic syndrome.  We will evaluate for coronary artery disease with coronary calcium score-coronary CT angiogram with possible FFR.

## 2019-04-21 ENCOUNTER — Other Ambulatory Visit: Payer: Self-pay

## 2019-04-21 ENCOUNTER — Ambulatory Visit (HOSPITAL_COMMUNITY): Payer: Medicare Other | Attending: Cardiology

## 2019-04-21 DIAGNOSIS — R0600 Dyspnea, unspecified: Secondary | ICD-10-CM | POA: Diagnosis not present

## 2019-04-21 DIAGNOSIS — R0609 Other forms of dyspnea: Secondary | ICD-10-CM

## 2019-04-21 DIAGNOSIS — I10 Essential (primary) hypertension: Secondary | ICD-10-CM | POA: Diagnosis not present

## 2019-04-21 DIAGNOSIS — Z01810 Encounter for preprocedural cardiovascular examination: Secondary | ICD-10-CM | POA: Insufficient documentation

## 2019-04-21 DIAGNOSIS — I272 Pulmonary hypertension, unspecified: Secondary | ICD-10-CM | POA: Insufficient documentation

## 2019-04-21 HISTORY — PX: TRANSTHORACIC ECHOCARDIOGRAM: SHX275

## 2019-04-25 ENCOUNTER — Other Ambulatory Visit: Payer: Self-pay

## 2019-04-25 DIAGNOSIS — I272 Pulmonary hypertension, unspecified: Secondary | ICD-10-CM | POA: Diagnosis not present

## 2019-04-25 DIAGNOSIS — Z01810 Encounter for preprocedural cardiovascular examination: Secondary | ICD-10-CM | POA: Diagnosis not present

## 2019-04-25 DIAGNOSIS — R0600 Dyspnea, unspecified: Secondary | ICD-10-CM | POA: Diagnosis not present

## 2019-04-25 LAB — BASIC METABOLIC PANEL
BUN/Creatinine Ratio: 15 (ref 10–24)
BUN: 17 mg/dL (ref 8–27)
CO2: 27 mmol/L (ref 20–29)
Calcium: 9.5 mg/dL (ref 8.6–10.2)
Chloride: 100 mmol/L (ref 96–106)
Creatinine, Ser: 1.13 mg/dL (ref 0.76–1.27)
GFR calc Af Amer: 74 mL/min/{1.73_m2} (ref >59)
GFR calc non Af Amer: 64 mL/min/{1.73_m2} (ref >59)
Glucose: 101 mg/dL — ABNORMAL HIGH (ref 65–99)
Potassium: 5.3 mmol/L — ABNORMAL HIGH (ref 3.5–5.2)
Sodium: 140 mmol/L (ref 134–144)

## 2019-04-25 MED ORDER — ALBUTEROL SULFATE (2.5 MG/3ML) 0.083% IN NEBU: 2.5000 mg | INHALATION_SOLUTION | Freq: Four times a day (QID) | RESPIRATORY_TRACT | 1 refills | Status: DC | PRN

## 2019-04-27 ENCOUNTER — Ambulatory Visit: Payer: Medicare Other | Admitting: Cardiology

## 2019-05-10 ENCOUNTER — Telehealth: Payer: Self-pay | Admitting: Nurse Practitioner

## 2019-05-10 DIAGNOSIS — N1831 Chronic kidney disease, stage 3a: Secondary | ICD-10-CM

## 2019-05-10 DIAGNOSIS — E1142 Type 2 diabetes mellitus with diabetic polyneuropathy: Secondary | ICD-10-CM

## 2019-05-10 DIAGNOSIS — I10 Essential (primary) hypertension: Secondary | ICD-10-CM

## 2019-05-10 NOTE — Telephone Encounter (Signed)
Pt report taking lisinopril 20 mg BID for a long time. Charlotte please advise,

## 2019-05-10 NOTE — Telephone Encounter (Signed)
Copied from [place] [id number]. Topic: General >> May 10, 2019 11:38 AM Keene Breath wrote: Reason for CRM: Patient's wife called to ask that the doctor send another script to the pharmacy for his lisinopril (ZESTRIL) 20 MG tablet.  She stated that he takes it 2x daily, and the script they sent says 1x daily.  Please advise and call to discuss at [phone]

## 2019-05-10 NOTE — Telephone Encounter (Signed)
His old records indicate 20mg  daily. I recommend he discuss increase dose with cardiology

## 2019-05-11 NOTE — Telephone Encounter (Signed)
Pt aware of message below. Pt stated Dr. Velna Hatchet (previouse provider) changed to 1 tab BID couple years ago. Pt will call to follow up with cardiology if not hear anything from their office.   Forwarding this message to cardiology. Please advise

## 2019-05-12 MED ORDER — LISINOPRIL 20 MG PO TABS: 20.0000 mg | ORAL_TABLET | Freq: Two times a day (BID) | ORAL | 1 refills | Status: DC

## 2019-05-12 MED ORDER — HYDROCHLOROTHIAZIDE 12.5 MG PO CAPS: ORAL_CAPSULE | ORAL | 1 refills | Status: DC

## 2019-05-12 MED ORDER — METFORMIN HCL 850 MG PO TABS: ORAL_TABLET | ORAL | 1 refills | Status: DC

## 2019-05-12 NOTE — Telephone Encounter (Signed)
Charlotte please advise,  

## 2019-05-12 NOTE — Telephone Encounter (Signed)
I have seen the patient one time in 4 years.  I have no idea what he was taking before.  What I have listed is all that I can see.  I am not sure what he was truly taking.  If he was taking 20 twice daily then he should be on 20 twice daily if he is taking 20 once a day and then 20 once a day.  When patients are seen in the clinic, the medicines are reviewed by the [facility].  I would have to look at the old prescription bottles to see what he truly is taking.  Glenetta Hew, MD

## 2019-05-12 NOTE — Telephone Encounter (Signed)
Mr. Hilton records from previous pcp: Dr. Elio Forget indicate lisinopril dose of 20mg  BID. Last BP done 04/25/2019 indicated stable renal function with potassium of 5.3. He is also taking HCTZ 12.5mg  daily. He denies taking any potassium supplement. He is schedule for Cardiac CT on 05/16/2019. I advised him to schedule lab appt for repeat BMP after cardiac CT. He verbalized understanding. Lisinopril 20mg  BID was sent to his pharmacy

## 2019-05-13 ENCOUNTER — Other Ambulatory Visit: Payer: Self-pay | Admitting: Nurse Practitioner

## 2019-05-13 DIAGNOSIS — M1A9XX0 Chronic gout, unspecified, without tophus (tophi): Secondary | ICD-10-CM

## 2019-05-15 ENCOUNTER — Telehealth (HOSPITAL_COMMUNITY): Payer: Self-pay | Admitting: Emergency Medicine

## 2019-05-15 NOTE — Telephone Encounter (Signed)
Pt returning phone call regarding upcoming cardiac imaging study; pt verbalizes understanding of appt date/time, parking situation and where to check in, pre-test NPO status and medications ordered, and verified current allergies; name and call back number provided for further questions should they arise Marybeth Dandy RN Navigator Cardiac Imaging [facility] [phone] office [phone] cell   

## 2019-05-15 NOTE — Telephone Encounter (Signed)
Left message on voicemail with name and callback number Kaito Schulenburg RN Navigator Cardiac Imaging [facility] [phone] Office [phone] Cell  

## 2019-05-16 ENCOUNTER — Encounter (HOSPITAL_COMMUNITY): Payer: Self-pay | Admitting: Emergency Medicine

## 2019-05-16 ENCOUNTER — Other Ambulatory Visit: Payer: Self-pay

## 2019-05-16 ENCOUNTER — Emergency Department (HOSPITAL_COMMUNITY): Payer: Medicare Other

## 2019-05-16 ENCOUNTER — Ambulatory Visit (HOSPITAL_COMMUNITY)
Admission: RE | Admit: 2019-05-16 | Discharge: 2019-05-16 | Disposition: A | Discharge status: Home / Self Care | Payer: Medicare Other | Source: Ambulatory Visit | Attending: Cardiology | Admitting: Cardiology

## 2019-05-16 ENCOUNTER — Emergency Department (HOSPITAL_COMMUNITY)
Admission: EM | Admit: 2019-05-16 | Discharge: 2019-05-16 | Disposition: A | Discharge status: Home / Self Care | Payer: Medicare Other | Attending: Emergency Medicine | Admitting: Emergency Medicine

## 2019-05-16 DIAGNOSIS — R0602 Shortness of breath: Secondary | ICD-10-CM | POA: Insufficient documentation

## 2019-05-16 DIAGNOSIS — Z006 Encounter for examination for normal comparison and control in clinical research program: Secondary | ICD-10-CM

## 2019-05-16 DIAGNOSIS — Z01810 Encounter for preprocedural cardiovascular examination: Secondary | ICD-10-CM

## 2019-05-16 DIAGNOSIS — R0609 Other forms of dyspnea: Secondary | ICD-10-CM

## 2019-05-16 DIAGNOSIS — Z5329 Procedure and treatment not carried out because of patient's decision for other reasons: Secondary | ICD-10-CM | POA: Diagnosis not present

## 2019-05-16 DIAGNOSIS — I272 Pulmonary hypertension, unspecified: Secondary | ICD-10-CM

## 2019-05-16 DIAGNOSIS — Z8679 Personal history of other diseases of the circulatory system: Secondary | ICD-10-CM

## 2019-05-16 DIAGNOSIS — I10 Essential (primary) hypertension: Secondary | ICD-10-CM

## 2019-05-16 HISTORY — DX: Personal history of other diseases of the circulatory system: Z86.79

## 2019-05-16 LAB — CBC
HCT: 42.1 % (ref 39.0–52.0)
Hemoglobin: 13.2 g/dL (ref 13.0–17.0)
MCH: 28.6 pg (ref 26.0–34.0)
MCHC: 31.4 g/dL (ref 30.0–36.0)
MCV: 91.3 fL (ref 80.0–100.0)
Platelets: 225 10*3/uL (ref 150–400)
RBC: 4.61 MIL/uL (ref 4.22–5.81)
RDW: 13.2 % (ref 11.5–15.5)
WBC: 7.2 10*3/uL (ref 4.0–10.5)
nRBC: 0 % (ref 0.0–0.2)

## 2019-05-16 LAB — TROPONIN I (HIGH SENSITIVITY): Troponin I (High Sensitivity): 5 ng/L (ref <18)

## 2019-05-16 LAB — BASIC METABOLIC PANEL
Anion gap: 12 (ref 5–15)
BUN: 14 mg/dL (ref 8–23)
CO2: 26 mmol/L (ref 22–32)
Calcium: 9.1 mg/dL (ref 8.9–10.3)
Chloride: 100 mmol/L (ref 98–111)
Creatinine, Ser: 1.14 mg/dL (ref 0.61–1.24)
GFR calc Af Amer: >60 mL/min (ref >60)
GFR calc non Af Amer: >60 mL/min (ref >60)
Glucose, Bld: 135 mg/dL — ABNORMAL HIGH (ref 70–99)
Potassium: 4.3 mmol/L (ref 3.5–5.1)
Sodium: 138 mmol/L (ref 135–145)

## 2019-05-16 MED ORDER — NITROGLYCERIN 0.4 MG SL SUBL: 0.8000 mg | SUBLINGUAL_TABLET | Freq: Once | SUBLINGUAL | Status: DC

## 2019-05-16 MED ORDER — SODIUM CHLORIDE 0.9% FLUSH: 3.0000 mL | Freq: Once | INTRAVENOUS | Status: DC

## 2019-05-16 NOTE — ED Notes (Signed)
Pt states he has an appointment for a cardiac CT scan. Pt was unable to do cardiac CT scan due to feeling SOB due to his MD discontinuing his albuterol and other meds prior to scan. Considering pt was [facility] he was transferred to the ER. Pt states that he feels he doesn't need to wait or be seen by an ER MD due to not being able to fulfill exactly why he is here "Cardiac CT scan". Charge informed and pt states he will follow up with his PCP in regards to todays visit.

## 2019-05-16 NOTE — ED Triage Notes (Signed)
Pt states he was here for a cardiac CT because he has been very SOB. Worsens with ambulation. Denies CP. Pt was unable to complete the CT due to not being able to lay flat.

## 2019-05-17 NOTE — Research (Signed)
Cadfem Informed Consent    Patient Name:  Andres Taylor   Subject met inclusion and exclusion criteria.  The informed consent form, study requirements and expectations were reviewed with the subject and questions and concerns were addressed prior to the signing of the consent form.  The subject verbalized understanding of the trail requirements.  The subject agreed to participate in the CADFEM trial and signed the informed consent.  The informed consent was obtained prior to performance of any protocol-specific procedures for the subject.  A copy of the signed informed consent was given to the subject and a copy was placed in the subject's medical record.   Neva Seat

## 2019-05-17 NOTE — Telephone Encounter (Signed)
If he is having worsening shortness of breath  Schedule him for return visit He can be scheduled with one of the APP's  His visit was significant for a PFT showing restrictive lung disease-result of being overweight  There was significant bronchodilator response in his PFT-suggesting he may have some small airway obstruction for which he is on Breo and albuterol-optimizing those medications as best as he can-we did review inhaler technique during his visit Is also on albuterol nebulization-which he should continue using  Cardiologist note does note increased pulmonary pressures Low oxygen levels at night .  This could be caused by obstructive sleep apnea in heavier people Obtaining a sleep study and treating sleep apnea or, oxygen supplementation if needed at night will help in this regard  Surely, if he is having more problems Having him seen in the office will be the best course

## 2019-05-18 ENCOUNTER — Encounter: Payer: Self-pay | Admitting: Nurse Practitioner

## 2019-05-19 ENCOUNTER — Other Ambulatory Visit: Payer: Self-pay | Admitting: Nurse Practitioner

## 2019-05-19 DIAGNOSIS — E1142 Type 2 diabetes mellitus with diabetic polyneuropathy: Secondary | ICD-10-CM

## 2019-05-20 NOTE — Telephone Encounter (Signed)
My Chart Converstation:  Sorry for that unpleasant turn of events - >   As for a different test - yes, I have asked my nurse to cancel the CT & change to a Nuclear Stress Test (this is done @ my office).  You should be contacted by our schedulers.  Glenetta Hew, MD   ===View-only below this line===   ----- Message -----      From:Andres Taylor      Sent:05/16/2019  8:11 PM EST        GC:[id number] Ellyn Hack, MD   Subject:Non-Urgent Medical Question  Dr. Ellyn Hack, I went for the Cardiac CT Scan today, they were unable to complete the test due to my shortness of breath.  They immediately sent me to the ER where I have been until about an hour ago.  While there they did an EKG, blood work and chest x-ray.  After waiting for hours, I talked to a nurse who in turn talked to the charge nurse.  Her impression was that they would not do anything, rather, have me follow up with you.  Is there another test that can be done to evaluate my heart that would not require my lying flat?  After visiting a Pulmonologist recently, I still do not have any diagnosis or impressions of that visit for me to determine if why issues are my lungs or my heart?   Please advise Thanks, Andres Taylor

## 2019-05-22 ENCOUNTER — Telehealth: Payer: Self-pay | Admitting: Nurse Practitioner

## 2019-05-22 NOTE — Telephone Encounter (Signed)
I contacted Mr. And Mrs. Minix to discuss recent mychart message sent about possible hernia surgery. They are concerned about possible respiratory complications if he proceeded with hernia surgery. He currently has difficulty with PND, thus unable to complete cardiac CT ordered by cardiology. This was changed to Nuclear stress test by Dr. Ellyn Hack. Mr. Wicker states he does not want to schedule hernia surgery at this time until he is cleared by Dr. Ellyn Hack and Dr. Ander Slade. He stated denies any abdominal pain or melena or hematochezia in last 5month.  I advised Mr. And Mrs. Bennie to complete work-up with cardiology first, then with pulmonology prior to scheduling hernia surgery. They verbalized understanding and agreed with above instructions

## 2019-05-25 ENCOUNTER — Ambulatory Visit (INDEPENDENT_AMBULATORY_CARE_PROVIDER_SITE_OTHER): Payer: Medicare Other | Admitting: Adult Health

## 2019-05-25 ENCOUNTER — Encounter: Payer: Self-pay | Admitting: Adult Health

## 2019-05-25 ENCOUNTER — Telehealth: Payer: Self-pay | Admitting: Nurse Practitioner

## 2019-05-25 ENCOUNTER — Other Ambulatory Visit: Payer: Self-pay

## 2019-05-25 DIAGNOSIS — I10 Essential (primary) hypertension: Secondary | ICD-10-CM

## 2019-05-25 DIAGNOSIS — J45991 Cough variant asthma: Secondary | ICD-10-CM | POA: Insufficient documentation

## 2019-05-25 DIAGNOSIS — R05 Cough: Secondary | ICD-10-CM | POA: Diagnosis not present

## 2019-05-25 DIAGNOSIS — J4530 Mild persistent asthma, uncomplicated: Secondary | ICD-10-CM | POA: Diagnosis not present

## 2019-05-25 DIAGNOSIS — J984 Other disorders of lung: Secondary | ICD-10-CM | POA: Diagnosis not present

## 2019-05-25 DIAGNOSIS — R0602 Shortness of breath: Secondary | ICD-10-CM

## 2019-05-25 DIAGNOSIS — R0609 Other forms of dyspnea: Secondary | ICD-10-CM

## 2019-05-25 DIAGNOSIS — R0600 Dyspnea, unspecified: Secondary | ICD-10-CM | POA: Diagnosis not present

## 2019-05-25 DIAGNOSIS — G4733 Obstructive sleep apnea (adult) (pediatric): Secondary | ICD-10-CM

## 2019-05-25 DIAGNOSIS — J45909 Unspecified asthma, uncomplicated: Secondary | ICD-10-CM | POA: Insufficient documentation

## 2019-05-25 DIAGNOSIS — R053 Chronic cough: Secondary | ICD-10-CM

## 2019-05-25 MED ORDER — OLMESARTAN MEDOXOMIL 20 MG PO TABS: 20.0000 mg | ORAL_TABLET | Freq: Every day | ORAL | 1 refills | Status: DC

## 2019-05-25 NOTE — Telephone Encounter (Signed)
Charlotte please advise 

## 2019-05-25 NOTE — Telephone Encounter (Signed)
VLM for the pt to call back, need to inform message below and offer virtual visit for the pt next week to follow on BP since we started new rx.

## 2019-05-25 NOTE — Assessment & Plan Note (Signed)
Progressive dyspnea with activity over the last 5 years with significant increase in the last year.  Suspect this is multifactorial as patient has multiple comorbidities, morbid obesity, apparent deconditioning, he also has a right-sided elevated hemodiaphragm dating back to 2015 which may also be contributing to his shortness of breath. Does not appear to have significant pulmonary hypertension.  PFTs show a restrictive process with also positive bronchodilator response may have a component of asthma would like for him to use Breo on a scheduled basis and albuterol only as needed. We will set up for a high-resolution CT chest to rule out underlying interstitial lung disease.  Plan  Patient Instructions  Take BREO 1 puff daily , rinse after use.  Use Albuterol neb every 4-6 hrs as needed -this is your rescue  Medication .  Discuss with Primary MD that Lisinopril may be aggravating your cough .  Mucinex Twice daily  As needed  Cough/congestion  Delsym 2 tsp Twice daily  As needed  For cough.   Set up for Home sleep study .   Set up for HRCT chest .   Follow up in 4 weeks with Dr. Ander Slade or Khya Halls NP and As needed   Please contact office for sooner follow up if symptoms do not improve or worsen or seek emergency care

## 2019-05-25 NOTE — Telephone Encounter (Signed)
Patient was seen by Parrett, Fonnie Mu, NP [facility] today and specialist advised changing patient  lisinopril (ZESTRIL) 20 MG tablet due to coughing being a side effect. Patient would like a follow up regarding PCP thoughts.

## 2019-05-25 NOTE — Assessment & Plan Note (Signed)
Pulmonary function testing shows restrictive process with reversibility/positive bronchodilator response.  May have a component of asthma.  He also has a daily cough which may be representative his underlying asthma.  For now would use his Memory [place] on a more scheduled basis.  Also consider changing ACE inhibitor as it may be aggravating his underlying chronic cough.  Plan  Patient Instructions  Take BREO 1 puff daily , rinse after use.  Use Albuterol neb every 4-6 hrs as needed -this is your rescue  Medication .  Discuss with Primary MD that Lisinopril may be aggravating your cough .  Mucinex Twice daily  As needed  Cough/congestion  Delsym 2 tsp Twice daily  As needed  For cough.   Set up for Home sleep study .   Set up for HRCT chest .   Follow up in 4 weeks with Dr. Ander Slade or Parrett NP and As needed   Please contact office for sooner follow up if symptoms do not improve or worsen or seek emergency care

## 2019-05-25 NOTE — Assessment & Plan Note (Signed)
Mild obstructive sleep apnea noted on previous sleep study in 2014.  Patient has daytime sleepiness snoring and is at high risk for obstructive sleep apnea.  With set up for a home sleep study to evaluate for possible worsening sleep apnea and need for nocturnal CPAP.

## 2019-05-25 NOTE — Telephone Encounter (Signed)
Per pulmonology's request, I changed lisinopril to olmesartan. He should discontinue lisinopril and start olmesartan. He should schedule 1week f/up video appt to eval BP.

## 2019-05-25 NOTE — Assessment & Plan Note (Signed)
Suspect is secondary to underlying obesity, also has a right sided hemodiaphragm elevation dating back to 2015.  May have a component of asthma.  O2 saturations are adequate today on room air. We will check a high-resolution CT chest to rule out underlying interstitial lung disease.

## 2019-05-25 NOTE — Patient Instructions (Addendum)
Take BREO 1 puff daily , rinse after use.  Use Albuterol neb every 4-6 hrs as needed -this is your rescue  Medication .  Discuss with Primary MD that Lisinopril may be aggravating your cough .  Mucinex Twice daily  As needed  Cough/congestion  Delsym 2 tsp Twice daily  As needed  For cough.   Set up for Home sleep study .   Set up for HRCT chest .   Follow up in 4 weeks with Dr. Ander Slade or Parrett NP and As needed   Please contact office for sooner follow up if symptoms do not improve or worsen or seek emergency care

## 2019-05-25 NOTE — Assessment & Plan Note (Signed)
Chronic cough.  Advised to use Mucinex and Delsym for cough control.  Consider changing ACE inhibitor as may be aggravating cough. Continue on GERD treatment  Plan  Patient Instructions  Take BREO 1 puff daily , rinse after use.  Use Albuterol neb every 4-6 hrs as needed -this is your rescue  Medication .  Discuss with Primary MD that Lisinopril may be aggravating your cough .  Mucinex Twice daily  As needed  Cough/congestion  Delsym 2 tsp Twice daily  As needed  For cough.   Set up for Home sleep study .   Set up for HRCT chest .   Follow up in 4 weeks with Dr. Ander Slade or Parrett NP and As needed   Please contact office for sooner follow up if symptoms do not improve or worsen or seek emergency care

## 2019-05-25 NOTE — Progress Notes (Signed)
@Patient  ID: Andres Taylor, male    DOB: 28-Jun-1946, 73 y.o.   MRN: [id number]  Chief Complaint  Patient presents with  . 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 CT chest January 2015 2.7 cm nodule in the right upper lobe  Chest x-ray 2019 no acute process  05/25/2019 Follow up: Restrictive lung disease, asthma, dyspnea, cough Patient presents for a 70-month follow-up.  Patient was seen in July for a pulmonary consult for progressive shortness of breath.  Says over the last 5 years he has had a slow progression of decreased activity tolerance and increased shortness of breath with activity.  This has substantially worsened over the last 6 to 12 months.  Patient has a daily cough he describes as productive at times.  Uses Mucinex to help with cough and congestion.  He does have a history of smoking but no formal diagnosis of underlying lung disease. Pulmonary function testing done on March 13, 2019 showed moderate restriction with an FEV1 at 60%, ratio 87, FVC 50%, positive bronchodilator response, mid flow reversibility, DLCO 63%.  (Hemoglobin 13.2 11-3) He is supposed to be on Breo but says he does not use it on a consistent basis.  Is on albuterol nebulizers and uses them 1-2 times a day. Patient says he has had a cough for a very long time. On   ACE inhibitor. Patient says he works full-time.  He shows Adult nurse estate.  He says it it is hard for him to walk any amount of distance and at times has to rest frequently.  He says he was last able to do his normal activities around 5 years ago. Chest x-ray dating back to 2015 shows elevated right hemodiaphragm  Has bilateral inguinal hernias. Is trying to determine if he wants to proceed with surgery as feels he is higher risk.   Working Biochemist, clinical . Adult nurse estate. No [employer] . No basement . No hot tubs . No travel .  Travels often. Last in [place] , [place], [place] 2018-2019.   He has been told that he had some sleep apnea in the past but was felt that it was mild.  He has daytime sleepiness.  Falls asleep easily if he sits still or watches TV.  And he does snore.  He does have daytime fatigue.  Previous echo in 2014 showed moderate pulmonary hypertension however repeat echo last month April 21, 2019 showed mild pulmonary hypertension at most.  Per cardiology not significant.  His EF was preserved on echo.  He is undergoing a cardiac work-up for his ongoing shortness of breath as well.  Has an upcoming stress Myoview..  No Known Allergies  Immunization History  Administered Date(s) Administered  . Influenza Whole 05/10/2007, 07/08/2010  . Influenza,inj,Quad PF,6+ Mos 04/17/2018  . Influenza-Unspecified 07/14/2011, 06/29/2013, 03/30/2014, 04/09/2015, 05/19/2016  . Pneumococcal Conjugate-13 01/05/2012, 06/29/2013, 03/13/2014  .  Pneumococcal Polysaccharide-23 02/15/2013, 04/09/2015  . Td 02/15/2013  . Tdap 07/08/2010, 07/13/2013  . Zoster 07/21/2010, 02/15/2013    Past Medical History:  Diagnosis Date  . Allergic rhinitis   . Allergy   . Arthritis   . Blood in urine   . Calculus of kidney   . DDD (degenerative disc disease)   . Diabetes mellitus ([place])   . Eustachian tube dysfunction   . Fatty liver   . Gout   . Hyperlipidemia   . Hypertension   . Joint pain,  knee   . Leg edema   . Obesity   . Pneumonitis    right lung  . Sciatica   . Spinal stenosis     Tobacco History: Social History   Tobacco Use  Smoking Status Former Smoker  . Packs/day: 1.50  . Years: 42.00  . Pack years: 63.00  . Types: Cigarettes  . Quit date: 07/13/2002  . Years since quitting: 16.8  Smokeless Tobacco Never Used   Counseling given: Not Answered   Outpatient Medications Prior to Visit  Medication Sig Dispense Refill  . albuterol (PROVENTIL) (2.5 MG/3ML) 0.083% nebulizer solution Take 3 mLs (2.5 mg total) by nebulization every 6 (six) hours as needed for wheezing or shortness of breath. 360 mL 1  . allopurinol (ZYLOPRIM) 100 MG tablet TAKE 1 TABLET BY MOUTH DAILY 90 tablet 1  . aspirin EC 81 MG tablet Take 81 mg by mouth daily.    [name] atorvastatin (LIPITOR) 10 MG tablet Take 1 tablet (10 mg total) by mouth daily at 6 PM. 90 tablet 3  . BREO ELLIPTA 200-25 MCG/INH AEPB INHALE 1 PUFF INTO THE LUNGS DAILY 1 each 5  . cetirizine (ZYRTEC) 10 MG chewable tablet Chew 10 mg by mouth daily.    . colchicine 0.6 MG tablet Take 0.6 mg by mouth 2 (two) times daily as needed (for gout). PRN for Gout    . docusate sodium (COLACE) 100 MG capsule Take 1 capsule (100 mg total) by mouth daily. (Patient taking differently: Take 100 mg by mouth as needed. ) 30 capsule 0  . fluticasone (FLONASE) 50 MCG/ACT nasal spray as needed.   5  . guaiFENesin (MUCINEX) 600 MG 12 hr tablet Take 600 mg by mouth daily.    . hydrochlorothiazide (MICROZIDE) 12.5 MG capsule TAKE 1 CAPSULE(12.5 MG) BY MOUTH DAILY 90 capsule 1  . ipratropium (ATROVENT) 0.06 % nasal spray Place 2 sprays into the nose 4 (four) times daily. 30 mL 3  . metFORMIN (GLUCOPHAGE) 850 MG tablet TAKE 1 TABLET(850 MG) BY MOUTH TWICE DAILY WITH A MEAL 180 tablet 1  . methocarbamol (ROBAXIN) 500 MG tablet TAKE 1 TABLET(500 MG) BY MOUTH EVERY 8 HOURS AS NEEDED FOR MUSCLE SPASMS 90 tablet 1  . montelukast (SINGULAIR) 10 MG tablet Take  1 tablet (10 mg total) by mouth at bedtime. (Patient taking differently: Take 10 mg by mouth as needed. ) 90 tablet 3  . Multiple Vitamin (MULTIVITAMIN) tablet Take 1 tablet by mouth daily.    [name] omega-3 acid ethyl esters (LOVAZA) 1 g capsule TAKE 2 CAPSULES BY MOUTH TWICE DAILY 240 capsule 2  . pantoprazole (PROTONIX) 20 MG tablet Take 1 tablet (20 mg total) by mouth 2 (two) times daily. 60 tablet 1  . PROAIR RESPICLICK 123XX123 (90 Base) MCG/ACT AEPB INHALE 2 PUFFS EVERY 4-6 HOURS AS NEEDED FOR COUGH/ WHEEZE 1 each 1  . lisinopril (ZESTRIL) 20 MG tablet Take 1 tablet (20 mg total) by mouth  2 (two) times daily. 180 tablet 1  . metoprolol tartrate (LOPRESSOR) 50 MG tablet Take 1 tablet (50 mg total) by mouth once for 1 dose. TAKE TWO HOUR PRIOR TO  SCHEDULE CARDIAC TEST 2 tablet 0   No facility-administered medications prior to visit.      Review of Systems:   Constitutional:   No  weight loss, night sweats,  Fevers, chills,  +fatigue, or  lassitude.  HEENT:   No headaches,  Difficulty swallowing,  Tooth/dental problems, or  Sore throat,                No sneezing, itching, ear ache, nasal congestion, post nasal drip,   CV:  No chest pain,  Orthopnea, PND, swelling in lower extremities, anasarca, dizziness, palpitations, syncope.   GI  No heartburn, indigestion, abdominal pain, nausea, vomiting, diarrhea, change in bowel habits, loss of appetite, bloody stools.   Resp:    No chest wall deformity  Skin: no rash or lesions.  GU: no dysuria, change in color of urine, no urgency or frequency.  No flank pain, no hematuria   MS:  No joint pain or swelling.  No decreased range of motion.  No back pain.    Physical Exam  BP 116/64 (BP Location: Left Arm, Cuff Size: Normal)   Pulse 70   Temp 97.8 F (36.6 C) (Temporal)   Ht 5' 7.5" (1.715 m)   Wt 240 lb (108.9 kg)   SpO2 96%   BMI 37.03 kg/m   GEN: A/Ox3; pleasant , NAD, elderly    HEENT:  [place]/AT,   NOSE-clear, THROAT-clear, no  lesions, no postnasal drip or exudate noted.   NECK:  Supple w/ fair ROM; no JVD; normal carotid impulses w/o bruits; no thyromegaly or nodules palpated; no lymphadenopathy.    RESP  Clear  P & A; w/o, wheezes/ rales/ or rhonchi. no accessory muscle use, no dullness to percussion  CARD:  RRR, no m/r/g, tr  peripheral edema, pulses intact, no cyanosis or clubbing.  GI:   Soft & nt; nml bowel sounds; no organomegaly or masses detected.   Musco: Warm bil, no deformities or joint swelling noted.   Neuro: alert, no focal deficits noted.    Skin: Warm, no lesions or rashes    Lab Results:  CBC    Component Value Date/Time   WBC 7.2 05/16/2019 1627   RBC 4.61 05/16/2019 1627   HGB 13.2 05/16/2019 1627   HCT 42.1 05/16/2019 1627   PLT 225 05/16/2019 1627   MCV 91.3 05/16/2019 1627   MCH 28.6 05/16/2019 1627   MCHC 31.4 05/16/2019 1627   RDW 13.2 05/16/2019 1627   LYMPHSABS 0.6 (L) 12/03/2013 2030   MONOABS 0.8 12/03/2013 2030   EOSABS 0.0 12/03/2013 2030   BASOSABS 0.0 12/03/2013 2030    BMET    Component Value Date/Time   NA 138 05/16/2019 1627   NA 140 04/25/2019 1145   K 4.3 05/16/2019 1627   CL 100 05/16/2019 1627   CO2 26 05/16/2019 1627   GLUCOSE 135 (H) 05/16/2019 1627   BUN 14 05/16/2019 1627   BUN 17 04/25/2019 1145   CREATININE 1.14 05/16/2019 1627   CREATININE 0.93 04/18/2013 1400   CALCIUM 9.1 05/16/2019 1627   GFRNONAA >60 05/16/2019 1627   GFRAA >60 05/16/2019 1627    BNP No results found for: BNP  ProBNP    Component Value Date/Time   PROBNP 14.0 09/06/2018 1059    Imaging: Dg Chest 2  View  Result Date: 05/16/2019 CLINICAL DATA:  Shortness of breath EXAM: CHEST - 2 VIEW COMPARISON:  05/09/2018 FINDINGS: Cardiomegaly. Aortic atherosclerosis. Unchanged elevation of the right hemidiaphragm. Disc degenerative disease of the thoracic spine. IMPRESSION: Cardiomegaly without acute abnormality of the lungs. Unchanged elevation of the right  hemidiaphragm. Electronically Signed   By: Eddie Candle M.D.   On: 05/16/2019 16:46      PFT Results Latest Ref Rng & Units 03/13/2019  FVC-Pre L 1.83  FVC-Predicted Pre % 47  FVC-Post L 1.95  FVC-Predicted Post % 50  Pre FEV1/FVC % % 83  Post FEV1/FCV % % 87  FEV1-Pre L 1.52  FEV1-Predicted Pre % 54  FEV1-Post L 1.69  DLCO UNC% % 63  DLCO COR %Predicted % 105  TLC L 4.63  TLC % Predicted % 70  RV % Predicted % 110    No results found for: NITRICOXIDE      Assessment & Plan:   OSA (obstructive sleep apnea) Mild obstructive sleep apnea noted on previous sleep study in 2014.  Patient has daytime sleepiness snoring and is at high risk for obstructive sleep apnea.  With set up for a home sleep study to evaluate for possible worsening sleep apnea and need for nocturnal CPAP.  Dyspnea on exertion Progressive dyspnea with activity over the last 5 years with significant increase in the last year.  Suspect this is multifactorial as patient has multiple comorbidities, morbid obesity, apparent deconditioning, he also has a right-sided elevated hemodiaphragm dating back to 2015 which may also be contributing to his shortness of breath. Does not appear to have significant pulmonary hypertension.  PFTs show a restrictive process with also positive bronchodilator response may have a component of asthma would like for him to use Breo on a scheduled basis and albuterol only as needed. We will set up for a high-resolution CT chest to rule out underlying interstitial lung disease.  Plan  Patient Instructions  Take BREO 1 puff daily , rinse after use.  Use Albuterol neb every 4-6 hrs as needed -this is your rescue  Medication .  Discuss with Primary MD that Lisinopril may be aggravating your cough .  Mucinex Twice daily  As needed  Cough/congestion  Delsym 2 tsp Twice daily  As needed  For cough.   Set up for Home sleep study .   Set up for HRCT chest .   Follow up in 4 weeks with Dr.  Ander Slade or Mirza Kidney NP and As needed   Please contact office for sooner follow up if symptoms do not improve or worsen or seek emergency care         Asthma Pulmonary function testing shows restrictive process with reversibility/positive bronchodilator response.  May have a component of asthma.  He also has a daily cough which may be representative his underlying asthma.  For now would use his Memory Dance on a more scheduled basis.  Also consider changing ACE inhibitor as it may be aggravating his underlying chronic cough.  Plan  Patient Instructions  Take BREO 1 puff daily , rinse after use.  Use Albuterol neb every 4-6 hrs as needed -this is your rescue  Medication .  Discuss with Primary MD that Lisinopril may be aggravating your cough .  Mucinex Twice daily  As needed  Cough/congestion  Delsym 2 tsp Twice daily  As needed  For cough.   Set up for Home sleep study .   Set up for HRCT chest .   Follow  up in 4 weeks with Dr. Ander Slade or Royanne Warshaw NP and As needed   Please contact office for sooner follow up if symptoms do not improve or worsen or seek emergency care         Chronic cough Chronic cough.  Advised to use Mucinex and Delsym for cough control.  Consider changing ACE inhibitor as may be aggravating cough. Continue on GERD treatment  Plan  Patient Instructions  Take BREO 1 puff daily , rinse after use.  Use Albuterol neb every 4-6 hrs as needed -this is your rescue  Medication .  Discuss with Primary MD that Lisinopril may be aggravating your cough .  Mucinex Twice daily  As needed  Cough/congestion  Delsym 2 tsp Twice daily  As needed  For cough.   Set up for Home sleep study .   Set up for HRCT chest .   Follow up in 4 weeks with Dr. Ander Slade or Kiano Terrien NP and As needed   Please contact office for sooner follow up if symptoms do not improve or worsen or seek emergency care         Restrictive lung disease Suspect is secondary to underlying obesity, also has a  right sided hemodiaphragm elevation dating back to 2015.  May have a component of asthma.  O2 saturations are adequate today on room air. We will check a high-resolution CT chest to rule out underlying interstitial lung disease.     Rexene Edison, NP 05/25/2019

## 2019-05-26 NOTE — Telephone Encounter (Signed)
Patient is returning a call from the nurse.  Please call patient back at [phone]

## 2019-05-26 NOTE — Telephone Encounter (Signed)
Pt is aware and appt made  

## 2019-05-29 ENCOUNTER — Telehealth: Payer: Self-pay | Admitting: Adult Health

## 2019-05-29 NOTE — Telephone Encounter (Signed)
Undergoing pulmonary workup will need to await results until can sign off on clearance

## 2019-05-29 NOTE — Telephone Encounter (Signed)
Pt is scheduled for a CT chest on 06/01/2019.   Will route back to [place] as she has the fax from [facility].

## 2019-05-29 NOTE — Telephone Encounter (Signed)
Received fax from [facility]  Fax is requesting surgery clearance for Lap Bilateral Inguinal Hernia Repair w/ Mesh in the near future.  A note of pulmonary clearance is requested to be faxed to [phone] attn Illene Regulus CMA  Routing to [facility] NP

## 2019-06-01 ENCOUNTER — Other Ambulatory Visit: Payer: Self-pay

## 2019-06-01 ENCOUNTER — Ambulatory Visit (HOSPITAL_BASED_OUTPATIENT_CLINIC_OR_DEPARTMENT_OTHER)
Admission: RE | Admit: 2019-06-01 | Discharge: 2019-06-01 | Disposition: A | Discharge status: Home / Self Care | Payer: Medicare Other | Source: Ambulatory Visit | Attending: Adult Health | Admitting: Adult Health

## 2019-06-01 DIAGNOSIS — R0602 Shortness of breath: Secondary | ICD-10-CM | POA: Diagnosis not present

## 2019-06-02 ENCOUNTER — Other Ambulatory Visit: Payer: Self-pay | Admitting: Nurse Practitioner

## 2019-06-02 ENCOUNTER — Telehealth (INDEPENDENT_AMBULATORY_CARE_PROVIDER_SITE_OTHER): Payer: Medicare Other | Admitting: Nurse Practitioner

## 2019-06-02 ENCOUNTER — Encounter: Payer: Self-pay | Admitting: Nurse Practitioner

## 2019-06-02 DIAGNOSIS — E782 Mixed hyperlipidemia: Secondary | ICD-10-CM

## 2019-06-02 DIAGNOSIS — I10 Essential (primary) hypertension: Secondary | ICD-10-CM

## 2019-06-02 MED ORDER — OMEGA-3-ACID ETHYL ESTERS 1 G PO CAPS: 2.0000 | ORAL_CAPSULE | Freq: Two times a day (BID) | ORAL | 1 refills | Status: DC

## 2019-06-02 NOTE — Patient Instructions (Signed)
Continue current medications. Send BP reading in 1week through [employer].

## 2019-06-02 NOTE — Progress Notes (Signed)
Virtual Visit via Video Note  I connected with Andres Taylor on 06/02/19 at  1:00 PM EST by a video enabled telemedicine application and verified that I am speaking with the correct person using two identifiers.  Location: Patient: Home Provider: office  patient and provider present during video call. I discussed the limitations of evaluation and management by telemedicine and the availability of in person appointments. The patient expressed understanding and agreed to proceed.  History of Present Illness: HTN: Lisinopril changed to benicar per pulmonologist recommendation Home BP reading: 154/82, 121/78, 123/76, 128/79, 147/86, 150/83 Denies any headache, chest pain, or dizziness. Denies any change in cough or SOB with above change BP Readings from Last 3 Encounters:  06/02/19 (!) 150/83  05/25/19 116/64  05/16/19 126/77   Observations/Objective: Physical Exam  Constitutional: He is oriented to person, place, and time.  Cardiovascular: Normal rate.  Pulmonary/Chest: Effort normal.  Neurological: He is alert and oriented to person, place, and time.  Psychiatric: He has a normal mood and affect. His behavior is normal. Thought content normal.  Vitals reviewed.  Assessment and Plan: Andres Taylor was seen today for follow-up.  Diagnoses and all orders for this visit:  HTN (hypertension), benign   Follow Up Instructions: See avs   I discussed the assessment and treatment plan with the patient. The patient was provided an opportunity to ask questions and all were answered. The patient agreed with the plan and demonstrated an understanding of the instructions.   The patient was advised to call back or seek an in-person evaluation if the symptoms worsen or if the condition fails to improve as anticipated.  Wilfred Lacy, NP

## 2019-06-03 ENCOUNTER — Encounter: Payer: Self-pay | Admitting: Nurse Practitioner

## 2019-06-06 ENCOUNTER — Ambulatory Visit: Payer: Medicare Other | Admitting: Nurse Practitioner

## 2019-06-06 NOTE — Telephone Encounter (Signed)
MyChart message:  Dr. Ellyn Hack, I completed the CT Scan of my lungs today ordered by the Pulmonologist.  My breathing has been some better after a change in my blood pressure meds (caused coughing) and a clarification of my Breo and the use of the nebulizer.  I have not heard from your scheduler and am ready to complete the Nuclear Stress test so that we may have information to make a decision about my hernia surgery.  Thank you, Andres Taylor. Andres Taylor you are feeling better.  I think we can do a stress test as opposed to the CT scan because it may be quicker to get the results back. As long as your breathing is doing okay with your inhaler.  Bring it with you for the procedure.  My nurse scheduler will get in touch with you about scheduling.  Glenetta Hew, MD

## 2019-06-06 NOTE — Progress Notes (Signed)
Appt scheduled with Parrett NP for 07/03/19 @ 0900.

## 2019-06-06 NOTE — Telephone Encounter (Signed)
Per TP: needs to complete pulmonary workup

## 2019-06-07 ENCOUNTER — Other Ambulatory Visit: Payer: Self-pay | Admitting: *Deleted

## 2019-06-07 DIAGNOSIS — Z01810 Encounter for preprocedural cardiovascular examination: Secondary | ICD-10-CM

## 2019-06-07 DIAGNOSIS — R0609 Other forms of dyspnea: Secondary | ICD-10-CM

## 2019-06-07 NOTE — Progress Notes (Signed)
Per verbal order Dr Ellyn Hack- schedule lexiscan  For patient -  Pre op clearance

## 2019-06-07 NOTE — Telephone Encounter (Signed)
Order has been placed for lexiscan per  Dr Ellyn Hack.

## 2019-06-11 ENCOUNTER — Other Ambulatory Visit: Payer: Self-pay | Admitting: Nurse Practitioner

## 2019-06-13 ENCOUNTER — Telehealth (HOSPITAL_COMMUNITY): Payer: Self-pay

## 2019-06-13 NOTE — Telephone Encounter (Signed)
Encounter complete. 

## 2019-06-14 ENCOUNTER — Telehealth: Payer: Self-pay | Admitting: Nurse Practitioner

## 2019-06-14 NOTE — Telephone Encounter (Signed)
Pt aware.

## 2019-06-14 NOTE — Telephone Encounter (Signed)
Please inform Andres Taylor that I received his BP recording. Thank you for the feedback. BP is acceptable and no need to change medication at this time.

## 2019-06-15 ENCOUNTER — Ambulatory Visit (HOSPITAL_COMMUNITY)
Admission: RE | Admit: 2019-06-15 | Discharge: 2019-06-15 | Disposition: A | Discharge status: Home / Self Care | Payer: Medicare Other | Source: Ambulatory Visit | Attending: Cardiology | Admitting: Cardiology

## 2019-06-15 ENCOUNTER — Other Ambulatory Visit: Payer: Self-pay

## 2019-06-15 DIAGNOSIS — R0609 Other forms of dyspnea: Secondary | ICD-10-CM

## 2019-06-15 DIAGNOSIS — R0600 Dyspnea, unspecified: Secondary | ICD-10-CM | POA: Diagnosis not present

## 2019-06-15 DIAGNOSIS — Z01810 Encounter for preprocedural cardiovascular examination: Secondary | ICD-10-CM | POA: Diagnosis not present

## 2019-06-15 HISTORY — PX: NM MYOVIEW LTD: HXRAD82

## 2019-06-15 LAB — MYOCARDIAL PERFUSION IMAGING
LV dias vol: 83 mL (ref 62–150)
LV sys vol: 25 mL
Peak HR: 86 {beats}/min
Rest HR: 69 {beats}/min
SDS: 0
SRS: 5
SSS: 5
TID: 1.11

## 2019-06-15 MED ORDER — TECHNETIUM TC 99M TETROFOSMIN IV KIT
30.1000 | PACK | Freq: Once | INTRAVENOUS | Status: AC | PRN
  Administered 2019-06-15: 30.1 via INTRAVENOUS
  Filled 2019-06-15: qty 31

## 2019-06-15 MED ORDER — TECHNETIUM TC 99M TETROFOSMIN IV KIT
10.6000 | PACK | Freq: Once | INTRAVENOUS | Status: AC | PRN
  Administered 2019-06-15: 10.6 via INTRAVENOUS
  Filled 2019-06-15: qty 11

## 2019-06-15 MED ORDER — REGADENOSON 0.4 MG/5ML IV SOLN
0.4000 mg | Freq: Once | INTRAVENOUS | Status: AC
  Administered 2019-06-15: 0.4 mg via INTRAVENOUS

## 2019-06-19 ENCOUNTER — Other Ambulatory Visit: Payer: Self-pay | Admitting: Pulmonary Disease

## 2019-06-19 ENCOUNTER — Telehealth: Payer: Self-pay | Admitting: Nurse Practitioner

## 2019-06-19 DIAGNOSIS — E1142 Type 2 diabetes mellitus with diabetic polyneuropathy: Secondary | ICD-10-CM

## 2019-06-19 NOTE — Telephone Encounter (Signed)
Received refill request from [facility] for gabapentin 300 mg cap take 1 take BID. Please advise, this rx discontinued since 03/17/2019.

## 2019-06-20 MED ORDER — GABAPENTIN 300 MG PO CAPS: 300.0000 mg | ORAL_CAPSULE | Freq: Two times a day (BID) | ORAL | 3 refills | Status: DC

## 2019-06-23 ENCOUNTER — Other Ambulatory Visit: Payer: Self-pay

## 2019-06-23 ENCOUNTER — Ambulatory Visit (INDEPENDENT_AMBULATORY_CARE_PROVIDER_SITE_OTHER): Payer: Medicare Other | Admitting: Cardiology

## 2019-06-23 ENCOUNTER — Encounter: Payer: Self-pay | Admitting: Cardiology

## 2019-06-23 VITALS — BP 118/70 | HR 84 | Ht 68.0 in | Wt 240.0 lb

## 2019-06-23 DIAGNOSIS — I208 Other forms of angina pectoris: Secondary | ICD-10-CM | POA: Diagnosis not present

## 2019-06-23 DIAGNOSIS — R600 Localized edema: Secondary | ICD-10-CM | POA: Diagnosis not present

## 2019-06-23 DIAGNOSIS — I10 Essential (primary) hypertension: Secondary | ICD-10-CM | POA: Diagnosis not present

## 2019-06-23 DIAGNOSIS — R0609 Other forms of dyspnea: Secondary | ICD-10-CM

## 2019-06-23 DIAGNOSIS — J984 Other disorders of lung: Secondary | ICD-10-CM

## 2019-06-23 DIAGNOSIS — R0600 Dyspnea, unspecified: Secondary | ICD-10-CM | POA: Diagnosis not present

## 2019-06-23 DIAGNOSIS — E668 Other obesity: Secondary | ICD-10-CM

## 2019-06-23 DIAGNOSIS — I272 Pulmonary hypertension, unspecified: Secondary | ICD-10-CM | POA: Diagnosis not present

## 2019-06-23 DIAGNOSIS — E782 Mixed hyperlipidemia: Secondary | ICD-10-CM | POA: Diagnosis not present

## 2019-06-23 DIAGNOSIS — Z01810 Encounter for preprocedural cardiovascular examination: Secondary | ICD-10-CM

## 2019-06-23 DIAGNOSIS — I2089 Other forms of angina pectoris: Secondary | ICD-10-CM

## 2019-06-23 DIAGNOSIS — E669 Obesity, unspecified: Secondary | ICD-10-CM

## 2019-06-23 LAB — HM DIABETES EYE EXAM

## 2019-06-23 MED ORDER — HYDROCHLOROTHIAZIDE 12.5 MG PO CAPS: 12.5000 mg | ORAL_CAPSULE | Freq: Every day | ORAL | 1 refills | Status: DC | PRN

## 2019-06-23 MED ORDER — COQ10 100 MG PO CAPS: 100.0000 mg | ORAL_CAPSULE | Freq: Three times a day (TID) | ORAL | Status: DC

## 2019-06-23 MED ORDER — DILTIAZEM HCL ER COATED BEADS 180 MG PO CP24: 180.0000 mg | ORAL_CAPSULE | Freq: Every day | ORAL | 2 refills | Status: DC

## 2019-06-23 NOTE — Progress Notes (Signed)
Primary Care Provider: [name], NP Cardiologist: Glenetta Hew, MD General Surgeon: Dr. Clyda Greener  Clinic Note: Chief Complaint  Patient presents with  . Follow-up    Dyspnea  . Pre-op Exam    Test results    HPI:    Andres Taylor is a 73 y.o. male with a PMH  with hypertension, hyperlipidemia and diabetes mellitus-2 along with ?COPD and restrictive lung disease from longstanding history of smoking who presents today for 2 month  F/u to discuss test results.  Prior to be seen this October, Andres Taylor was last seen in October 2014 as part of preop assessment for right upper lobe lobectomy by Dr. Roxan Hockey.  This was also for worsening exertional dyspnea and lower extremity edema.  Echocardiogram was relatively normal at the time.  He was getting short of breath barely walking around his house. ->  In order to expedite preoperative assessment, we proceeded with right and left heart catheterization  Andres Taylor was last seen on 2019-04-19 for profound exertional dyspnea and fatigue.  Not able to do much without getting short of breath.  Was huffing and puffing walking to the clinic room.  Some associated chest discomfort.  No rest symptoms.  No PND orthopnea or edema. - Epworth score is roughly 10.  May need to consider OSA evaluation, but will defer to pulmonary . -> Coronary CTA ordered but canceled and converted to Myoview.  Echocardiogram ordered  Recent Hospitalizations: None   Reviewed  CV studies:    Studies Personally Reviewed - (if available, images/films reviewed: From Epic Chart or Care Everywhere)  Myoview 2019-06-15: EF 70%.  No ischemia or infarction.  LOW RISK   Echocardiogram 2019-04-21: EF 60 to 65%.  Mild LVH.  Normal RV.  Normal atrial size.  Mild aortic sclerosis.  No stenosis. Lipomatous intra-atrial septum  Interval History:   Andres Taylor presents today for follow-up still notes exertional dyspnea -= after 100-150 feet - get  tired & SOB.  No associated chest discomfort.  Apparently he is in the process of being evaluated for home sleep study evaluation.  He did not do very well with the inpatient study, did not sleep enough to get a good result. He really does not think that his exertional dyspnea is any different than has been over the last couple months but does think that it is much worse than it was years back and we did a heart cath.    CV Review of Symptoms (Summary): positive for - dyspnea on exertion negative for - edema, irregular heartbeat, orthopnea, palpitations, paroxysmal nocturnal dyspnea, rapid heart rate, shortness of breath or syncope/ near syncope; TIA/amaurosis fugax; claudication  The patient does not have symptoms concerning for COVID-19 infection (fever, chills, cough, or new shortness of breath).  The patient is practicing social distancing. ++ Masking.  Not much Groceries/shopping. [employer] - still doing management.    REVIEWED OF SYSTEMS   A comprehensive ROS was performed. Review of Systems  Constitutional: Negative for chills, fever, malaise/fatigue and weight loss.  HENT: Negative for congestion and nosebleeds.   Respiratory: Positive for shortness of breath (only with exertion). Negative for cough and wheezing.   Cardiovascular: Negative for leg swelling.  Gastrointestinal: Negative for blood in stool, heartburn and melena.  Genitourinary: Negative for hematuria.  Musculoskeletal: Positive for back pain (chronic LBP ). Negative for falls, joint pain and myalgias.  Neurological: Negative for dizziness and headaches.  Endo/Heme/Allergies: Negative for environmental allergies.  Psychiatric/Behavioral: Negative for depression and memory loss. The patient has insomnia. The patient is not nervous/anxious.     I have reviewed and (if needed) personally updated the patient's problem list, medications, allergies, past medical and surgical history, social and family  history.   PAST MEDICAL HISTORY   Past Medical History:  Diagnosis Date  . Allergic rhinitis   . Allergy   . Arthritis   . Blood in urine   . Calculus of Taylor   . DDD (degenerative disc disease)   . Diabetes mellitus ([place])   . Eustachian tube dysfunction   . Fatty liver   . Gout   . Hyperlipidemia   . Hypertension   . Joint pain, knee   . Leg edema   . Obesity   . Pneumonitis    right lung  . Sciatica   . Spinal stenosis     PAST SURGICAL HISTORY   Past Surgical History:  Procedure Laterality Date  . APPENDECTOMY  1976  . BACK SURGERY    . COLONOSCOPY  08/2010  . [place]  2001  . HERNIA REPAIR     as a baby 68 months old  . LEFT AND RIGHT HEART CATHETERIZATION WITH CORONARY ANGIOGRAM N/A 04/20/2013   Procedure: LEFT AND RIGHT HEART CATHETERIZATION WITH CORONARY ANGIOGRAM;  Surgeon: Leonie Man, MD;  Location: [facility] CATH LAB;  Angiographically normal Coronary Arteries - L Dom Cx (OM1, small LPL1 then LPL2 & PDA) & wraparound LAD (D1-3 small).  Borderline CO/CI - 4.94/2.16 by both Fick & TD.  RAP ~14 mmHg, RVEDP 15 mmHg.  LVEDP 19 mmHg (->diastolic dysfunction) PCWP 16 mmHg, but PA Mean 25 mmHg  . LITHOTRIPSY     x 2  . LOBECTOMY Right   . lumbar disectomy L4-5  1980  . NM MYOVIEW LTD  06/15/2019    EF 70%.  No ischemia or infarction.  LOW RISK   . TRANSTHORACIC ECHOCARDIOGRAM  04/21/2019   EF 60 to 65%.  Mild LVH.  Normal RV.  Normal atrial size.  Mild aortic sclerosis.  No stenosis. Lipomatous intra-atrial septum  . VIDEO ASSISTED THORACOSCOPY (VATS)/WEDGE RESECTION Right 08/30/2013   Procedure: VIDEO ASSISTED THORACOSCOPY (VATS)/WEDGE RESECTION;  Surgeon: Melrose Nakayama, MD;  Location: [facility];  Service: Thoracic;  Laterality: Right;     MEDICATIONS/ALLERGIES   Current Meds  Medication Sig  . allopurinol (ZYLOPRIM) 100 MG tablet TAKE 1 TABLET BY MOUTH DAILY  . aspirin EC 81 MG tablet Take 81 mg by mouth daily.  [name] atorvastatin (LIPITOR) 10 MG  tablet Take 1 tablet (10 mg total) by mouth daily at 6 PM.  . BREO ELLIPTA 200-25 MCG/INH AEPB INHALE 1 PUFF INTO THE LUNGS DAILY  . cetirizine (ZYRTEC) 10 MG chewable tablet Chew 10 mg by mouth daily.  . colchicine 0.6 MG tablet Take 0.6 mg by mouth 2 (two) times daily as needed (for gout). PRN for Gout  . docusate sodium (COLACE) 100 MG capsule Take 1 capsule (100 mg total) by mouth daily. (Patient taking differently: Take 100 mg by mouth as needed. )  . fluticasone (FLONASE) 50 MCG/ACT nasal spray as needed.   . gabapentin (NEURONTIN) 300 MG capsule Take 1 capsule (300 mg total) by mouth 2 (two) times daily.  [name] guaiFENesin (MUCINEX) 600 MG 12 hr tablet Take 600 mg by mouth daily.  . hydrochlorothiazide (MICROZIDE) 12.5 MG capsule Take 1 capsule (12.5 mg total) by mouth daily as needed (For swelling).  [name] ipratropium (ATROVENT) 0.06 %  nasal spray INSTILL 2 SPRAYS INTO THE NOSE FOUR TIMES DAILY  . metFORMIN (GLUCOPHAGE) 850 MG tablet TAKE 1 TABLET(850 MG) BY MOUTH TWICE DAILY WITH A MEAL  . methocarbamol (ROBAXIN) 500 MG tablet Take 1 tablet (500 mg total) by mouth every 12 (twelve) hours as needed for muscle spasms.  . montelukast (SINGULAIR) 10 MG tablet Take 1 tablet (10 mg total) by mouth at bedtime. (Patient taking differently: Take 10 mg by mouth as needed. )  . Multiple Vitamin (MULTIVITAMIN) tablet Take 1 tablet by mouth daily.  [name] olmesartan (BENICAR) 20 MG tablet Take 1 tablet (20 mg total) by mouth daily.  [name] omega-3 acid ethyl esters (LOVAZA) 1 g capsule Take 2 capsules (2 g total) by mouth 2 (two) times daily.  . pantoprazole (PROTONIX) 20 MG tablet Take 1 tablet (20 mg total) by mouth 2 (two) times daily.  [name] PROAIR RESPICLICK 123XX123 (90 Base) MCG/ACT AEPB INHALE 2 PUFFS EVERY 4-6 HOURS AS NEEDED FOR COUGH/ WHEEZE  . [DISCONTINUED] hydrochlorothiazide (MICROZIDE) 12.5 MG capsule TAKE 1 CAPSULE(12.5 MG) BY MOUTH DAILY    No Known Allergies   SOCIAL HISTORY/FAMILY HISTORY   Social  History   Tobacco Use  . Smoking status: Former Smoker    Packs/day: 1.50    Years: 42.00    Pack years: 63.00    Types: Cigarettes    Quit date: 07/13/2002    Years since quitting: 16.9  . Smokeless tobacco: Never Used  Substance Use Topics  . Alcohol use: Yes    Alcohol/week: 3.0 standard drinks    Types: 3 Cans of beer per week    Comment: 3 tmes weekly  . Drug use: No   Social History   Social History Narrative   He and his first wife divorced in 20 14/2015.  He is now remarried and lives with his new wife.   He has 2 adult children-aged 55 and 39 (he had 3 children, 1 of his sons--actually the twin brother of 48 of the existing sons, died after complications of redo heart transplant following Adriamycin toxicity from leukemia as a child, he died at age 68.     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   He quit smoking in 2004 after 50+ pack year smoking history.  He occasionally drinks alcohol.    Family History family history includes AAA (abdominal aortic aneurysm) in his son; Breast cancer in his maternal grandmother; Cancer in his maternal grandmother and son; Heart failure in his son; Hypertension in his maternal grandfather; Leukemia in his father; Stroke in his maternal grandfather, maternal grandmother, mother, and paternal grandmother.   OBJCTIVE -PE, EKG, labs   Wt Readings from Last 3 Encounters:  06/23/19 240 lb (108.9 kg)  06/15/19 240 lb (108.9 kg)  05/25/19 240 lb (108.9 kg)    Physical Exam: BP 118/70   Pulse 84   Ht 5\' 8"  (1.727 m)   Wt 240 lb (108.9 kg)   SpO2 95%   BMI 36.49 kg/m  Physical Exam  Constitutional: He is oriented to person, place, and time. He appears well-developed and well-nourished. No distress.  Borderline morbidly obese with BMI of 37 and risk factors including hypertension, hyperlipidemia and diabetes.  Otherwise healthy appearing.  HENT:  Head: Normocephalic and atraumatic.  Neck: No hepatojugular reflux  (Unable to assess due to body habitus) and no JVD (Very difficult to assess due to body habitus) present. Carotid bruit is not present.  Cardiovascular: Normal rate, regular rhythm, S1 normal,  S2 normal and intact distal pulses.  No extrasystoles are present. PMI is not displaced (Unable to assess). Exam reveals distant heart sounds. Exam reveals no gallop and no friction rub.  No murmur heard. Pulmonary/Chest: Effort normal and breath sounds normal. No respiratory distress. He has no wheezes. He has no rales.  Abdominal: Soft. Bowel sounds are normal. He exhibits no distension. There is no abdominal tenderness. There is no rebound.  Musculoskeletal:        General: No edema (Trivial). Normal range of motion.     Cervical back: Normal range of motion and neck supple.  Neurological: He is alert and oriented to person, place, and time.  Psychiatric: He has a normal mood and affect. His behavior is normal. Judgment and thought content normal.  Vitals reviewed.   Adult ECG Report Not checked  Recent Labs:   Lab Results  Component Value Date   CHOL 115 12/06/2018   HDL 34.40 (L) 12/06/2018   LDLCALC 51 12/06/2018   LDLDIRECT 132.0 09/06/2018   TRIG 149.0 12/06/2018   CHOLHDL 3 12/06/2018   Lab Results  Component Value Date   CREATININE 1.14 05/16/2019   BUN 14 05/16/2019   NA 138 05/16/2019   K 4.3 05/16/2019   CL 100 05/16/2019   CO2 26 05/16/2019    ASSESSMENT/PLAN    Problem List Items Addressed This Visit    Leg edema (Chronic)   Hyperlipidemia - well-controlled (Chronic)    Recent labs show excellent control of his lipids on current dose of statin.  Monitor liver function given fatty liver disease.  Continue Lovaza along with atorvastatin..      Relevant Medications   hydrochlorothiazide (MICROZIDE) 12.5 MG capsule   diltiazem (CARDIZEM CD) 180 MG 24 hr capsule   HTN (hypertension), benign (Chronic)    Blood pressure looks pretty good today he is currently on an ARB  and HCTZ.  Would like to add diltiazem as a nondiet appearing calcium channel blocker with rate control but also vasodilatory effect.  Plan will be to use HCTZ as needed for edema, and start diltiazem XT 180 mg       Relevant Medications   hydrochlorothiazide (MICROZIDE) 12.5 MG capsule   diltiazem (CARDIZEM CD) 180 MG 24 hr capsule   Moderate obesity (Chronic)    Moderate obesity by BMI, but with risk factors which are considered morbid obesity.  Unfortunately he has not been able to do a great job with weight loss, and has maintained a stable weight.      Pulmonary HTN (HCC) (Chronic)    Recent echo would not necessarily suggest significantly elevated pulmonary pressures I do not think significant evaluation is necessary, however with consideration of some component of pulmonary retention along with microvascular disease in a patient with documented fatty liver disease, diabetes, hypertension hyperlipidemia, we will start low-dose diltiazem.  Plan: DC HCTZ and start diltiazem XT 180 mg p.o. daily      Relevant Medications   hydrochlorothiazide (MICROZIDE) 12.5 MG capsule   diltiazem (CARDIZEM CD) 180 MG 24 hr capsule   Restrictive lung disease (Chronic)    Related to underlying obesity.  Also has some right-sided diaphragmatic elevation and some component of COPD per pulmonary medicine.  Pulmonary: Planning high risk CT to rule out underlying interstitial lung disease      Dyspnea on exertion    Probably multifactorial with clearly some pulmonary issues along with obesity, deconditioning and cannot exclude microvascular disease.  He has had relatively normal heart  catheterization and a nonischemic Myoview.  Echo was also relatively normal.  However clearly has risk factors for microvascular disease.  Plan: Add calcium channel blocker-diltiazem XT 180 mg p.o. daily.      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HCC)    Certainly, without direct measurement using coronary flow reserve, this could  not likely be 1% diagnosed, however not unreasonable to empirically treat with calcium channel blocker which would allow for microvascular vasodilation for both coronary microvasculature as well as pulmonary microvasculature.  Plan: Add diltiazem XT 180 mg p.o. daily      Relevant Medications   hydrochlorothiazide (MICROZIDE) 12.5 MG capsule   diltiazem (CARDIZEM CD) 180 MG 24 hr capsule   Pre-operative cardiovascular examination - Primary    He has upcoming relatively low-dose surgery, cardiac standpoint. Relatively normal echo and stress test.  Would hope to do coronary CTA with CT FFR, this was canceled because of his inability to tolerate the study.  Myoview showed no evidence of ischemia which would argue against any macrovascular disease.  He is a diabetic, but not currently on insulin, with normal renal function and no history of stroke.  No active CHF symptoms with normal echocardiogram.  Only noting exertional dyspnea but with negative Myoview would unlikely be angina.  PREOPERATIVE CARDIAC RISK ASSESSMENT   Revised Cardiac Risk Index:  High Risk Surgery: no; LOW RISK  Defined as Intraperitoneal, intrathoracic or suprainguinal vascular  Active CAD: no; nonischemic Myoview  CHF: no; No PND, orthopnea or edema with normal echo  Cerebrovascular Disease: no;   Diabetes: yes; On Insulin: no  CKD (Cr >~ 2): no; last creatinine 1.14.  Total: 0 Estimated Risk of Adverse Outcome: LOW RISK   Estimated Risk of MI, PE, VF/VT (Cardiac Arrest), Complete Heart Block: <0.9 % - > major risk would be involved with his pulmonary status and likely OSA.   ACC/AHA Guidelines for "Clearance":  Step 1 - Need for Emergency Surgery: No: Elective  If Yes - go straight to OR with perioperative surveillance  Step 2 - Active Cardiac Conditions (Unstable Angina, Decompensated HF, Significant  Arrhytmias - Complete HB, Mobitz II, Symptomatic VT or SVT, Severe Aortic Stenosis - mean gradient > 40  mmHg, Valve area < 1.0 cm2):   No: No active symptoms of angina or heart failure, no arrhythmias.  If Yes - Evaluate & Treat per ACC/AHA Guidelines  Step 3 -  Low Risk Surgery: YES  If Yes --> proceed to OR  If No --> Step 4  Step 4 - Functional Capacity >= 4 METS without symptoms: Yes, but barely.  If Yes --> proceed to OR  If No --> Step 5  Step 5 --  Clinical Risk Factors (CRF)   3 or more: He does have hypertension, hyperlipidemia and diabetes along with obesity, but has been evaluated with a normal echocardiogram and Myoview therefore no further testing is recommended.  Recommend proceeding with planned surgery without further cardiac evaluation.  From a cardiac standpoint, he would be considered a LOW RISK PATIENT for LOW RISK SURGERY.  Okay to hold aspirin preop 5-7 days.          COVID-19 Education: The signs and symptoms of COVID-19 were discussed with the patient and how to seek care for testing (follow up with PCP or arrange E-visit).   The importance of social distancing was discussed today.  I spent a total of 48minutes with the patient and chart review. >  50% of the time was spent in direct patient consultation.  Additional time spent with chart review (studies, outside notes, etc): 10 Total Time: 32 min   Current medicines are reviewed at length with the patient today.  (+/- concerns) n/a   Patient Instructions / Medication Changes & Studies & Tests Ordered   Patient Instructions  Medication Instructions:  CHANGE: How you take the Hydrochlorothiazide to once daily as needed for swelling START: Diltiazem 180 mg once daily at bedtime START: CoQ10 100 mg three times daily.   On day 1: take one pill with breakfast  On day 2: Take one pill with breakfast and one pill with lunch  On day 3: Take one pill with breakfast, lunch and dinner.  You may then start taking it three times daily. This may be cheaper for you over the counter.  *If you need a  refill on your cardiac medications before your next appointment, please call your pharmacy*  Lab Work: None ordered If you have labs (blood work) drawn today and your tests are completely normal, you will receive your results only by: [name] MyChart Message (if you have MyChart) OR . A paper copy in the mail If you have any lab test that is abnormal or we need to change your treatment, we will call you to review the results.  Testing/Procedures: None ordered  Follow-Up: At [facility], you and your health needs are our priority.  As part of our continuing mission to provide you with exceptional heart care, we have created designated Provider Care Teams.  These Care Teams include your primary Cardiologist (physician) and Advanced Practice Providers (APPs -  Physician Assistants and Nurse Practitioners) who all work together to provide you with the care you need, when you need it.  Your next appointment:   4 month(s)  The format for your next appointment:   Virtual Visit   Provider:   You may see Glenetta Hew, MD or one of the following Advanced Practice Providers on your designated Care Team:    Rosaria Ferries, PA-C  Jory Sims, DNP, ANP  Cadence Kathlen Mody, NP   Other Instructions You have been cleared for you procedure from a cardiac standpoint at a low/intermediate risk.   Studies Ordered:   No orders of the defined types were placed in this encounter.    Glenetta Hew, M.D., M.S. Interventional Cardiologist   Pager # [phone] Phone # [phone] [address] [place], [place] [address]   Thank you for choosing Heartcare at [facility]!!

## 2019-06-23 NOTE — Patient Instructions (Addendum)
Medication Instructions:  CHANGE: How you take the Hydrochlorothiazide to once daily as needed for swelling START: Diltiazem 180 mg once daily at bedtime START: CoQ10 100 mg three times daily.   On day 1: take one pill with breakfast  On day 2: Take one pill with breakfast and one pill with lunch  On day 3: Take one pill with breakfast, lunch and dinner.  You may then start taking it three times daily. This may be cheaper for you over the counter.  *If you need a refill on your cardiac medications before your next appointment, please call your pharmacy*  Lab Work: None ordered If you have labs (blood work) drawn today and your tests are completely normal, you will receive your results only by: [name] MyChart Message (if you have MyChart) OR . A paper copy in the mail If you have any lab test that is abnormal or we need to change your treatment, we will call you to review the results.  Testing/Procedures: None ordered  Follow-Up: At [facility], you and your health needs are our priority.  As part of our continuing mission to provide you with exceptional heart care, we have created designated Provider Care Teams.  These Care Teams include your primary Cardiologist (physician) and Advanced Practice Providers (APPs -  Physician Assistants and Nurse Practitioners) who all work together to provide you with the care you need, when you need it.  Your next appointment:   4 month(s)  The format for your next appointment:   Virtual Visit   Provider:   You may see Glenetta Hew, MD or one of the following Advanced Practice Providers on your designated Care Team:    Rosaria Ferries, PA-C  Jory Sims, DNP, ANP  Cadence Kathlen Mody, NP   Other Instructions You have been cleared for you procedure from a cardiac standpoint at a low/intermediate risk.

## 2019-06-25 ENCOUNTER — Other Ambulatory Visit: Payer: Self-pay | Admitting: Cardiology

## 2019-06-25 ENCOUNTER — Encounter: Payer: Self-pay | Admitting: Cardiology

## 2019-06-25 DIAGNOSIS — I10 Essential (primary) hypertension: Secondary | ICD-10-CM

## 2019-06-25 NOTE — Assessment & Plan Note (Signed)
Recent labs show excellent control of his lipids on current dose of statin.  Monitor liver function given fatty liver disease.  Continue Lovaza along with atorvastatin.Andres Taylor

## 2019-06-25 NOTE — Assessment & Plan Note (Addendum)
Certainly, without direct measurement using coronary flow reserve, this could not likely be 1% diagnosed, however not unreasonable to empirically treat with calcium channel blocker which would allow for microvascular vasodilation for both coronary microvasculature as well as pulmonary microvasculature.  Plan: Add diltiazem XT 180 mg p.o. daily

## 2019-06-25 NOTE — Assessment & Plan Note (Signed)
Related to underlying obesity.  Also has some right-sided diaphragmatic elevation and some component of COPD per pulmonary medicine.  Pulmonary: Planning high risk CT to rule out underlying interstitial lung disease

## 2019-06-25 NOTE — Assessment & Plan Note (Signed)
Blood pressure looks pretty good today he is currently on an ARB and HCTZ.  Would like to add diltiazem as a nondiet appearing calcium channel blocker with rate control but also vasodilatory effect.  Plan will be to use HCTZ as needed for edema, and start diltiazem XT 180 mg

## 2019-06-25 NOTE — Assessment & Plan Note (Signed)
Probably multifactorial with clearly some pulmonary issues along with obesity, deconditioning and cannot exclude microvascular disease.  He has had relatively normal heart catheterization and a nonischemic Myoview.  Echo was also relatively normal.  However clearly has risk factors for microvascular disease.  Plan: Add calcium channel blocker-diltiazem XT 180 mg p.o. daily.

## 2019-06-25 NOTE — Assessment & Plan Note (Addendum)
He has upcoming relatively low-dose surgery, cardiac standpoint. Relatively normal echo and stress test.  Would hope to do coronary CTA with CT FFR, this was canceled because of his inability to tolerate the study.  Myoview showed no evidence of ischemia which would argue against any macrovascular disease.  He is a diabetic, but not currently on insulin, with normal renal function and no history of stroke.  No active CHF symptoms with normal echocardiogram.  Only noting exertional dyspnea but with negative Myoview would unlikely be angina.  PREOPERATIVE CARDIAC RISK ASSESSMENT   Revised Cardiac Risk Index:  High Risk Surgery: no; LOW RISK  Defined as Intraperitoneal, intrathoracic or suprainguinal vascular  Active CAD: no; nonischemic Myoview  CHF: no; No PND, orthopnea or edema with normal echo  Cerebrovascular Disease: no;   Diabetes: yes; On Insulin: no  CKD (Cr >~ 2): no; last creatinine 1.14.  Total: 0 Estimated Risk of Adverse Outcome: LOW RISK   Estimated Risk of MI, PE, VF/VT (Cardiac Arrest), Complete Heart Block: <0.9 % - > major risk would be involved with his pulmonary status and likely OSA.   ACC/AHA Guidelines for "Clearance":  Step 1 - Need for Emergency Surgery: No: Elective  If Yes - go straight to OR with perioperative surveillance  Step 2 - Active Cardiac Conditions (Unstable Angina, Decompensated HF, Significant  Arrhytmias - Complete HB, Mobitz II, Symptomatic VT or SVT, Severe Aortic Stenosis - mean gradient > 40 mmHg, Valve area < 1.0 cm2):   No: No active symptoms of angina or heart failure, no arrhythmias.  If Yes - Evaluate & Treat per ACC/AHA Guidelines  Step 3 -  Low Risk Surgery: YES  If Yes --> proceed to OR  If No --> Step 4  Step 4 - Functional Capacity >= 4 METS without symptoms: Yes, but barely.  If Yes --> proceed to OR  If No --> Step 5  Step 5 --  Clinical Risk Factors (CRF)   3 or more: He does have hypertension,  hyperlipidemia and diabetes along with obesity, but has been evaluated with a normal echocardiogram and Myoview therefore no further testing is recommended.  Recommend proceeding with planned surgery without further cardiac evaluation.  From a cardiac standpoint, he would be considered a LOW RISK PATIENT for LOW RISK SURGERY.  Okay to hold aspirin preop 5-7 days.

## 2019-06-25 NOTE — Assessment & Plan Note (Signed)
Moderate obesity by BMI, but with risk factors which are considered morbid obesity.  Unfortunately he has not been able to do a great job with weight loss, and has maintained a stable weight.

## 2019-06-25 NOTE — Assessment & Plan Note (Signed)
Recent echo would not necessarily suggest significantly elevated pulmonary pressures I do not think significant evaluation is necessary, however with consideration of some component of pulmonary retention along with microvascular disease in a patient with documented fatty liver disease, diabetes, hypertension hyperlipidemia, we will start low-dose diltiazem.  Plan: DC HCTZ and start diltiazem XT 180 mg p.o. daily

## 2019-06-27 ENCOUNTER — Telehealth: Payer: Self-pay | Admitting: Gastroenterology

## 2019-06-27 ENCOUNTER — Encounter: Payer: Self-pay | Admitting: Gastroenterology

## 2019-06-27 NOTE — Telephone Encounter (Signed)
He can be scheduled for endo colon and previsit. Thank you

## 2019-06-27 NOTE — Telephone Encounter (Signed)
Pt called and requested to schedule a colon/EGD as discussed during 04/19/19 OV.  He stated that he saw his cardiologist 12.11.20 and has cardiac clearance.

## 2019-06-27 NOTE — Telephone Encounter (Signed)
Dr Ardis Hughs please review.  Is it ok to schedule endo colon? [facility] ok?

## 2019-06-27 NOTE — Telephone Encounter (Signed)
Yes, it is okay to schedule his endoscopy and colonoscopy for dark stools, abdominal pain.  Okay to be done at the [facility].  Thank you

## 2019-07-03 ENCOUNTER — Ambulatory Visit (INDEPENDENT_AMBULATORY_CARE_PROVIDER_SITE_OTHER): Payer: Medicare Other | Admitting: Adult Health

## 2019-07-03 ENCOUNTER — Other Ambulatory Visit: Payer: Self-pay

## 2019-07-03 ENCOUNTER — Encounter: Payer: Self-pay | Admitting: Adult Health

## 2019-07-03 DIAGNOSIS — Z23 Encounter for immunization: Secondary | ICD-10-CM | POA: Diagnosis not present

## 2019-07-03 DIAGNOSIS — G4733 Obstructive sleep apnea (adult) (pediatric): Secondary | ICD-10-CM

## 2019-07-03 DIAGNOSIS — R0600 Dyspnea, unspecified: Secondary | ICD-10-CM | POA: Diagnosis not present

## 2019-07-03 DIAGNOSIS — R053 Chronic cough: Secondary | ICD-10-CM

## 2019-07-03 DIAGNOSIS — R05 Cough: Secondary | ICD-10-CM

## 2019-07-03 DIAGNOSIS — R0609 Other forms of dyspnea: Secondary | ICD-10-CM

## 2019-07-03 DIAGNOSIS — J4530 Mild persistent asthma, uncomplicated: Secondary | ICD-10-CM

## 2019-07-03 NOTE — Progress Notes (Signed)
@Patient  ID: Andres Taylor, male    DOB: 12/24/1945, 73 y.o.   MRN: [id number]  Chief Complaint  Patient presents with  . 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 CT chest January 2015 2.7 cm nodule in the right upper lobe  Chest x-ray 2019 no acute process  Previous echo in 2014 showed moderate pulmonary hypertension however repeat echo last month April 21, 2019 showed mild pulmonary hypertension at most  Social history : Former smoker  Working Biochemist, clinical . Adult nurse estate. No [employer] . No basement . No hot tubs . No travel .  Travels often. Last in [place] , [place], [place] 2018-2019.   07/03/2019 Follow up : Restrictive Lung disease , Asthma ,  Patient returns for a 6-week follow-up.  Patient has had progressive shortness of breath with activity over the last 5 years that is substantially worsened at the beginning of 2020.  Last visit patient continued to complain of shortness of breath and daily cough.  Pulmonary function testing showed moderate restriction and a positive bronchodilator response.  Patient was recommended to use his Memory Dance on a consistent basis daily.  He was also recommended to change ACE inhibitor if possible. It was noted that patient has a elevated right hemidiaphragm dating back on chest x-ray to 2015.  Patient was  set up for a high-resolution CT chest that was completed on June 01, 2019 that showed no evidence of fibrotic interstitial lung disease.  Bandlike scarring atelectasis in the right lower lobe with elevation of the right hemidiaphragm.  This was similar to CT in 2015.,  Status post right upper lobe wedge resection.  Patient carries a diagnosis of sleep apnea.  He was set up for a home sleep study. Has not received set up .   Patient has been following with cardiology.  Patient underwent a stress Myoview on June 15, 2019 that showed low risk study and EF at 70%. Felt to have component of deconditioning as well.   Since last visit cough has resolved. Has not used his albuterol inhaler or neb. He is feeling some better but still gets winded with activity and exercise tolerance is low.   Is planning to have inguinal surgery done sometime in February .     No Known Allergies  Immunization History  Administered Date(s) Administered  . Fluad Quad(high Dose 65+) 07/03/2019  . Influenza Whole 05/10/2007, 07/08/2010  . Influenza,inj,Quad PF,6+ Mos 04/17/2018  . Influenza-Unspecified 07/14/2011, 06/29/2013, 03/30/2014, 04/09/2015, 05/19/2016  . Pneumococcal Conjugate-13 01/05/2012, 06/29/2013, 03/13/2014  . Pneumococcal Polysaccharide-23 02/15/2013, 04/09/2015  . Td 02/15/2013  . Tdap 07/08/2010, 07/13/2013  . Zoster 07/21/2010, 02/15/2013    Past Medical History:  Diagnosis Date  . Allergic rhinitis   . Allergy   . Arthritis   . Blood in urine   . Calculus of kidney   . DDD (degenerative disc disease)   . Diabetes mellitus (  [place])   . Eustachian tube dysfunction   . Fatty liver   . Gout   . Hyperlipidemia   . Hypertension   . Joint pain, knee   . Leg edema   . Obesity   . Pneumonitis    right lung  . Sciatica   . Spinal stenosis     Tobacco History: Social History   Tobacco Use  Smoking Status Former Smoker  . Packs/day: 1.50  . Years: 42.00  . Pack years: 63.00  .  Types: Cigarettes  . Quit date: 07/13/2002  . Years since quitting: 16.9  Smokeless Tobacco Never Used   Counseling given: Not Answered   Outpatient Medications Prior to Visit  Medication Sig Dispense Refill  . albuterol (PROVENTIL) (2.5 MG/3ML) 0.083% nebulizer solution Take 3 mLs (2.5 mg total) by nebulization every 6 (six) hours as needed for wheezing or shortness of breath. 360 mL 1  . allopurinol (ZYLOPRIM) 100 MG tablet TAKE 1 TABLET BY MOUTH DAILY 90 tablet 1  . aspirin EC 81 MG tablet Take 81 mg by mouth daily.    [name] atorvastatin (LIPITOR) 10 MG tablet Take 1 tablet (10 mg total) by mouth daily at 6 PM. 90 tablet 3  . BREO ELLIPTA 200-25 MCG/INH AEPB INHALE 1 PUFF INTO THE LUNGS DAILY 1 each 5  . cetirizine (ZYRTEC) 10 MG chewable tablet Chew 10 mg by mouth daily.    . Coenzyme Q10 (COQ10) 100 MG CAPS Take 100 mg by mouth 3 (three) times daily. 30 capsule   . colchicine 0.6 MG tablet Take 0.6 mg by mouth 2 (two) times daily as needed (for gout). PRN for Gout    . diltiazem (CARDIZEM CD) 180 MG 24 hr capsule Take 1 capsule (180 mg total) by mouth daily. 30 capsule 2  . docusate sodium (COLACE) 100 MG capsule Take 1 capsule (100 mg total) by mouth daily. (Patient taking differently: Take 100 mg by mouth as needed. ) 30 capsule 0  . fluticasone (FLONASE) 50 MCG/ACT nasal spray as needed.   5  . gabapentin (NEURONTIN) 300 MG capsule Take 1 capsule (300 mg total) by mouth 2 (two) times daily. 180 capsule 3  . guaiFENesin (MUCINEX) 600 MG 12 hr tablet Take 600 mg by mouth daily.    . hydrochlorothiazide (MICROZIDE) 12.5 MG capsule Take 1 capsule (12.5 mg total) by mouth daily as needed (For swelling). 30 capsule 1  . ipratropium (ATROVENT) 0.06 % nasal spray INSTILL 2 SPRAYS INTO THE NOSE FOUR TIMES DAILY 30 mL 3  . metFORMIN (GLUCOPHAGE) 850 MG tablet TAKE 1 TABLET(850 MG) BY MOUTH TWICE DAILY WITH A MEAL 180 tablet 1  . methocarbamol (ROBAXIN) 500 MG tablet Take 1 tablet (500 mg total) by  mouth every 12 (twelve) hours as needed for muscle spasms. 180 tablet 0  . montelukast (SINGULAIR) 10 MG tablet Take 1 tablet (10 mg total) by mouth at bedtime. (Patient taking differently: Take 10 mg by mouth as needed. ) 90 tablet 3  . Multiple Vitamin (MULTIVITAMIN) tablet Take 1 tablet by mouth daily.    [name] olmesartan (BENICAR) 20 MG tablet Take 1 tablet (20 mg total) by mouth daily. 90 tablet 1  . omega-3 acid ethyl esters (LOVAZA) 1 g capsule Take 2 capsules (2 g total) by mouth 2 (two) times daily. 360 capsule 1  . pantoprazole (PROTONIX) 20 MG tablet Take 1 tablet (20 mg total) by mouth 2 (two) times daily. 60 tablet 1  . [place] [id number] (  90 Base) MCG/ACT AEPB INHALE 2 PUFFS EVERY 4-6 HOURS AS NEEDED FOR COUGH/ WHEEZE 1 each 1   No facility-administered medications prior to visit.     Review of Systems:   Constitutional:   No  weight loss, night sweats,  Fevers, chills,  +fatigue, or  lassitude.  HEENT:   No headaches,  Difficulty swallowing,  Tooth/dental problems, or  Sore throat,                No sneezing, itching, ear ache, nasal congestion, post nasal drip,   CV:  No chest pain,  Orthopnea, PND, swelling in lower extremities, anasarca, dizziness, palpitations, syncope.   GI  No heartburn, indigestion, abdominal pain, nausea, vomiting, diarrhea, change in bowel habits, loss of appetite, bloody stools.   Resp:    No chest wall deformity  Skin: no rash or lesions.  GU: no dysuria, change in color of urine, no urgency or frequency.  No flank pain, no hematuria   MS:  No joint pain or swelling.  No decreased range of motion.  No back pain.    Physical Exam  BP 124/68 (BP Location: Right Arm, Patient Position: Sitting, Cuff Size: Normal)   Pulse 69   Temp 97.6 F (36.4 C)   Ht 5\' 8"  (1.727 m)   Wt 243 lb 9.6 oz (110.5 kg)   SpO2 94% Comment: on room air  BMI 37.04 kg/m   GEN: A/Ox3; pleasant , NAD, obese    HEENT:  [place]/AT,   NOSE-clear, THROAT-clear, no  lesions, no postnasal drip or exudate noted.   NECK:  Supple w/ fair ROM; no JVD; normal carotid impulses w/o bruits; no thyromegaly or nodules palpated; no lymphadenopathy.    RESP  Clear  P & A; w/o, wheezes/ rales/ or rhonchi. no accessory muscle use, no dullness to percussion  CARD:  RRR, no m/r/g, tr peripheral edema, pulses intact, no cyanosis or clubbing.  GI:   Soft & nt; nml bowel sounds; no organomegaly or masses detected.   Musco: Warm bil, no deformities or joint swelling noted.   Neuro: alert, no focal deficits noted.    Skin: Warm, no lesions or rashes    Lab Results:  CBC    Component Value Date/Time   WBC 7.2 05/16/2019 1627   RBC 4.61 05/16/2019 1627   HGB 13.2 05/16/2019 1627   HCT 42.1 05/16/2019 1627   PLT 225 05/16/2019 1627   MCV 91.3 05/16/2019 1627   MCH 28.6 05/16/2019 1627   MCHC 31.4 05/16/2019 1627   RDW 13.2 05/16/2019 1627   LYMPHSABS 0.6 (L) 12/03/2013 2030   MONOABS 0.8 12/03/2013 2030   EOSABS 0.0 12/03/2013 2030   BASOSABS 0.0 12/03/2013 2030    BMET    Component Value Date/Time   NA 138 05/16/2019 1627   NA 140 04/25/2019 1145   K 4.3 05/16/2019 1627   CL 100 05/16/2019 1627   CO2 26 05/16/2019 1627   GLUCOSE 135 (H) 05/16/2019 1627   BUN 14 05/16/2019 1627   BUN 17 04/25/2019 1145   CREATININE 1.14 05/16/2019 1627   CREATININE 0.93 04/18/2013 1400   CALCIUM 9.1 05/16/2019 1627   GFRNONAA >60 05/16/2019 1627   GFRAA >60 05/16/2019 1627    BNP No results found for: BNP  ProBNP    Component Value Date/Time   PROBNP 14.0 09/06/2018 1059    Imaging: LEXISCAN---MYOCARDIAL PERFUSION IMAGING  Result Date: 06/15/2019  Nuclear stress EF: 70%.  The left ventricular ejection fraction is  hyperdynamic (>65%).  There was no ST segment deviation noted during stress.  The study is normal.  This is a low risk study.  Normal stress nuclear study with no ischemia or infarction.  Gated ejection fraction 70% with normal wall  motion.      PFT Results Latest Ref Rng & Units 03/13/2019  FVC-Pre L 1.83  FVC-Predicted Pre % 47  FVC-Post L 1.95  FVC-Predicted Post % 50  Pre FEV1/FVC % % 83  Post FEV1/FCV % % 87  FEV1-Pre L 1.52  FEV1-Predicted Pre % 54  FEV1-Post L 1.69  DLCO UNC% % 63  DLCO COR %Predicted % 105  TLC L 4.63  TLC % Predicted % 70  RV % Predicted % 110    No results found for: NITRICOXIDE      Assessment & Plan:   Asthma Improved control on BREO and off ACE inhibitor .  Cough has resolved and decreased albuterol use  Plan  Patient Instructions  Take BREO 1 puff daily , rinse after use.  Use Albuterol neb every 4-6 hrs as needed -this is your rescue  Medication .    Mucinex Twice daily  As needed  Cough/congestion  Delsym 2 tsp Twice daily  As needed  For cough.   Complete  Home sleep study .   Activity as tolerated.  Flu shot today .   Follow up in 3 months with Dr. Ander Slade or Jamaris Theard NP and As needed   Please contact office for sooner follow up if symptoms do not improve or worsen or seek emergency care         Chronic cough Much improved off ACE inhibitor  HRCT chest negative for ILD .   Plan  Patient Instructions  Take BREO 1 puff daily , rinse after use.  Use Albuterol neb every 4-6 hrs as needed -this is your rescue  Medication .    Mucinex Twice daily  As needed  Cough/congestion  Delsym 2 tsp Twice daily  As needed  For cough.   Complete  Home sleep study .   Activity as tolerated.  Flu shot today .   Follow up in 3 months with Dr. Ander Slade or Makilah Dowda NP and As needed   Please contact office for sooner follow up if symptoms do not improve or worsen or seek emergency care         Dyspnea on exertion Suspect in multifactoral with deconditioning , obesity , asthma, and restrictive lung disease w/ elevated right hemidiaphragm.   Advance activity as tolerated.  After inguinal  surgery would benefit from pulmonary rehab/ exercise regimen .    OSA (obstructive sleep apnea) Daytime sleepiness, previous positive study in past .  Home sleep study pending .       Rexene Edison, NP 07/03/2019

## 2019-07-03 NOTE — Assessment & Plan Note (Signed)
Daytime sleepiness, previous positive study in past .  Home sleep study pending .

## 2019-07-03 NOTE — Assessment & Plan Note (Signed)
Suspect in multifactoral with deconditioning , obesity , asthma, and restrictive lung disease w/ elevated right hemidiaphragm.   Advance activity as tolerated.  After inguinal  surgery would benefit from pulmonary rehab/ exercise regimen .

## 2019-07-03 NOTE — Assessment & Plan Note (Signed)
Much improved off ACE inhibitor  HRCT chest negative for ILD .   Plan  Patient Instructions  Take BREO 1 puff daily , rinse after use.  Use Albuterol neb every 4-6 hrs as needed -this is your rescue  Medication .    Mucinex Twice daily  As needed  Cough/congestion  Delsym 2 tsp Twice daily  As needed  For cough.   Complete  Home sleep study .   Activity as tolerated.  Flu shot today .   Follow up in 3 months with Dr. Ander Slade or Lashanti Chambless NP and As needed   Please contact office for sooner follow up if symptoms do not improve or worsen or seek emergency care

## 2019-07-03 NOTE — Assessment & Plan Note (Signed)
Improved control on BREO and off ACE inhibitor .  Cough has resolved and decreased albuterol use  Plan  Patient Instructions  Take BREO 1 puff daily , rinse after use.  Use Albuterol neb every 4-6 hrs as needed -this is your rescue  Medication .    Mucinex Twice daily  As needed  Cough/congestion  Delsym 2 tsp Twice daily  As needed  For cough.   Complete  Home sleep study .   Activity as tolerated.  Flu shot today .   Follow up in 3 months with Dr. Ander Slade or Dewell Monnier NP and As needed   Please contact office for sooner follow up if symptoms do not improve or worsen or seek emergency care

## 2019-07-03 NOTE — Patient Instructions (Addendum)
Take BREO 1 puff daily , rinse after use.  Use Albuterol neb every 4-6 hrs as needed -this is your rescue  Medication .    Mucinex Twice daily  As needed  Cough/congestion  Delsym 2 tsp Twice daily  As needed  For cough.   Complete  Home sleep study .   Activity as tolerated.  Flu shot today .   Follow up in 3 months with Dr. Ander Slade or Sheelah Ritacco NP and As needed   Please contact office for sooner follow up if symptoms do not improve or worsen or seek emergency care

## 2019-07-04 ENCOUNTER — Encounter: Payer: Self-pay | Admitting: Nurse Practitioner

## 2019-07-04 ENCOUNTER — Telehealth (INDEPENDENT_AMBULATORY_CARE_PROVIDER_SITE_OTHER): Payer: Medicare Other | Admitting: Nurse Practitioner

## 2019-07-04 VITALS — BP 124/68 | Ht 68.0 in | Wt 241.0 lb

## 2019-07-04 DIAGNOSIS — E782 Mixed hyperlipidemia: Secondary | ICD-10-CM | POA: Diagnosis not present

## 2019-07-04 DIAGNOSIS — N182 Chronic kidney disease, stage 2 (mild): Secondary | ICD-10-CM | POA: Diagnosis not present

## 2019-07-04 DIAGNOSIS — I10 Essential (primary) hypertension: Secondary | ICD-10-CM

## 2019-07-04 DIAGNOSIS — E1169 Type 2 diabetes mellitus with other specified complication: Secondary | ICD-10-CM | POA: Diagnosis not present

## 2019-07-04 NOTE — Patient Instructions (Addendum)
Continue current medications. Has labs done during previsit with GI 07/25/2019. Needs to be fasting.

## 2019-07-04 NOTE — Progress Notes (Signed)
Virtual Visit via Video Note  I connected with Micheal Likens on 07/04/19 at  9:45 AM EST by a video enabled telemedicine application and verified that I am speaking with the correct person using two identifiers.  Location: Patient:home Provider:office Participants: wife, patient and provider I discussed the limitations of evaluation and management by telemedicine and the availability of in person appointments. The patient expressed understanding and agreed to proceed.  CC:HTN and DM f/up  History of Present Illness: HTN: Improved and BP at goal Also under the care by cardiology, last seen 06/23/19. cardiazem added due to chronic pulmonary HTN, benicar maintained and hydrochlorothiazide to be used prn for edema. Pending Home sleep study ordered by pulmonology. BP Readings from Last 3 Encounters:  07/04/19 124/68  07/03/19 124/68  06/23/19 118/70  reviewed recent echocardiogram, myoview with lexiscan, PFT, and BMP reports.  DM: Controlled with use of metformin, also on ARB and statin LDL not at goal, Last hgbA1c 6.9 Negative DM retinopathy and urine microalbumin. Stable diabetic neuropathy with gabapentin. Inactive due to chronic dyspnea with minimal exertion. Reports complains with low fat/low carb diet.   Wt Readings from Last 3 Encounters:  07/04/19 241 lb (109.3 kg)  07/03/19 243 lb 9.6 oz (110.5 kg)  06/23/19 240 lb (108.9 kg)   Observations/Objective: Physical Exam  Constitutional: He is oriented to person, place, and time. No distress.  Cardiovascular: Normal rate.  Pulmonary/Chest: Effort normal.  Neurological: He is alert and oriented to person, place, and time.  Psychiatric: He has a normal mood and affect. His behavior is normal. Thought content normal.  Vitals reviewed.  Assessment and Plan: Josiya was seen today for follow-up.  Diagnoses and all orders for this visit:  Type 2 diabetes mellitus with other specified complication, without long-term  current use of insulin ([place]) -     Hemoglobin A1c; Future -     Microalbumin / creatinine urine ratio; Future -     Hepatic function panel; Future  Mixed hyperlipidemia -     HTN_4 Lipid panel; Future -     Hepatic function panel; Future  HTN (hypertension), benign  CKD (chronic kidney disease) stage 2, GFR 60-89 ml/min   Follow Up Instructions: See avs   I discussed the assessment and treatment plan with the patient. The patient was provided an opportunity to ask questions and all were answered. The patient agreed with the plan and demonstrated an understanding of the instructions.   The patient was advised to call back or seek an in-person evaluation if the symptoms worsen or if the condition fails to improve as anticipated.  Wilfred Lacy, NP

## 2019-07-04 NOTE — Assessment & Plan Note (Signed)
Improved renal function. Negative urine protein. BMP Latest Ref Rng & Units 05/16/2019 04/25/2019 03/13/2019  Glucose 70 - 99 mg/dL 135(H) 101(H) 116(H)  BUN 8 - 23 mg/dL 14 17 23   Creatinine 0.61 - 1.24 mg/dL 1.14 1.13 1.12  BUN/Creat Ratio 10 - 24 - 15 -  Sodium 135 - 145 mmol/L 138 140 137  Potassium 3.5 - 5.1 mmol/L 4.3 5.3(H) 5.2(H)  Chloride 98 - 111 mmol/L 100 100 98  CO2 22 - 32 mmol/L 26 27 31   Calcium 8.9 - 10.3 mg/dL 9.1 9.5 10.5   Continue to monitor.

## 2019-07-12 ENCOUNTER — Ambulatory Visit: Payer: Medicare Other

## 2019-07-12 ENCOUNTER — Other Ambulatory Visit: Payer: Self-pay

## 2019-07-12 DIAGNOSIS — R0602 Shortness of breath: Secondary | ICD-10-CM

## 2019-07-12 DIAGNOSIS — G4733 Obstructive sleep apnea (adult) (pediatric): Secondary | ICD-10-CM | POA: Diagnosis not present

## 2019-07-19 ENCOUNTER — Telehealth: Payer: Self-pay | Admitting: Pulmonary Disease

## 2019-07-19 DIAGNOSIS — G4733 Obstructive sleep apnea (adult) (pediatric): Secondary | ICD-10-CM | POA: Diagnosis not present

## 2019-07-19 NOTE — Telephone Encounter (Signed)
Called and spoke with Patient.  Dr. Judson Roch results and recommendations given.  Understanding stated.  DME order placed.  Nothing further at this time.    Dr. Ander Slade has reviewed the home sleep test this showed obstructive sleep apnea.   Recommendations   Treatment options are CPAP with the settings auto 5 to 15.    Weight loss measures .   Advise against driving while sleepy & against medication with sedative side effects.  Encouraged lateral sleeping position, elevating head of bed by 30 degrees may help.   Make appointment for 3 months for compliance with download with Dr. Ander Slade.

## 2019-07-25 ENCOUNTER — Ambulatory Visit (AMBULATORY_SURGERY_CENTER): Payer: Self-pay | Admitting: *Deleted

## 2019-07-25 ENCOUNTER — Other Ambulatory Visit: Payer: Self-pay

## 2019-07-25 VITALS — Temp 97.3°F | Ht 68.0 in | Wt 243.8 lb

## 2019-07-25 DIAGNOSIS — R195 Other fecal abnormalities: Secondary | ICD-10-CM

## 2019-07-25 DIAGNOSIS — Z01818 Encounter for other preprocedural examination: Secondary | ICD-10-CM

## 2019-07-25 DIAGNOSIS — R140 Abdominal distension (gaseous): Secondary | ICD-10-CM

## 2019-07-25 MED ORDER — SUPREP BOWEL PREP KIT 17.5-3.13-1.6 GM/177ML PO SOLN: 1.0000 | Freq: Once | ORAL | 0 refills | Status: AC

## 2019-07-25 NOTE — Progress Notes (Signed)

## 2019-08-02 ENCOUNTER — Ambulatory Visit (INDEPENDENT_AMBULATORY_CARE_PROVIDER_SITE_OTHER): Payer: Medicare Other

## 2019-08-02 ENCOUNTER — Other Ambulatory Visit: Payer: Self-pay | Admitting: Gastroenterology

## 2019-08-02 DIAGNOSIS — Z1159 Encounter for screening for other viral diseases: Secondary | ICD-10-CM

## 2019-08-02 LAB — SARS CORONAVIRUS 2 (TAT 6-24 HRS): SARS Coronavirus 2: NEGATIVE

## 2019-08-05 ENCOUNTER — Ambulatory Visit: Payer: Medicare Other | Attending: Internal Medicine

## 2019-08-05 DIAGNOSIS — Z23 Encounter for immunization: Secondary | ICD-10-CM

## 2019-08-05 NOTE — Progress Notes (Signed)
   Covid-19 Vaccination Clinic  Name:  Mandela Abbot    MRN: [id number] DOB: 05/05/1946  08/05/2019  Mr. Luft was observed post Covid-19 immunization for 15 minutes without incidence. He was provided with Vaccine Information Sheet and instruction to access the V-Safe system.   Mr. Ocanas was instructed to call 911 with any severe reactions post vaccine: [name] Difficulty breathing  . Swelling of your face and throat  . A fast heartbeat  . A bad rash all over your body  . Dizziness and weakness    Immunizations Administered    Name Date Dose VIS Date Route   Pfizer COVID-19 Vaccine 08/05/2019  2:57 PM 0.3 mL 06/23/2019 Intramuscular   Manufacturer: [place]   Lot: [id number]   [place]: [id number]

## 2019-08-07 ENCOUNTER — Ambulatory Visit (AMBULATORY_SURGERY_CENTER): Payer: Medicare Other | Admitting: Gastroenterology

## 2019-08-07 ENCOUNTER — Other Ambulatory Visit: Payer: Self-pay

## 2019-08-07 ENCOUNTER — Encounter: Payer: Self-pay | Admitting: Gastroenterology

## 2019-08-07 VITALS — BP 108/66 | HR 70 | Temp 97.1°F | Resp 19 | Ht 68.0 in | Wt 243.0 lb

## 2019-08-07 DIAGNOSIS — K228 Other specified diseases of esophagus: Secondary | ICD-10-CM | POA: Diagnosis not present

## 2019-08-07 DIAGNOSIS — R140 Abdominal distension (gaseous): Secondary | ICD-10-CM | POA: Diagnosis not present

## 2019-08-07 DIAGNOSIS — K2980 Duodenitis without bleeding: Secondary | ICD-10-CM | POA: Diagnosis not present

## 2019-08-07 DIAGNOSIS — K2270 Barrett's esophagus without dysplasia: Secondary | ICD-10-CM

## 2019-08-07 DIAGNOSIS — R195 Other fecal abnormalities: Secondary | ICD-10-CM

## 2019-08-07 DIAGNOSIS — K2951 Unspecified chronic gastritis with bleeding: Secondary | ICD-10-CM | POA: Diagnosis not present

## 2019-08-07 DIAGNOSIS — D123 Benign neoplasm of transverse colon: Secondary | ICD-10-CM

## 2019-08-07 DIAGNOSIS — D125 Benign neoplasm of sigmoid colon: Secondary | ICD-10-CM

## 2019-08-07 DIAGNOSIS — K2950 Unspecified chronic gastritis without bleeding: Secondary | ICD-10-CM

## 2019-08-07 HISTORY — PX: UPPER GI ENDOSCOPY: SHX6162

## 2019-08-07 HISTORY — PX: COLONOSCOPY: SHX174

## 2019-08-07 MED ORDER — SODIUM CHLORIDE 0.9 % IV SOLN: 500.0000 mL | Freq: Once | INTRAVENOUS | Status: DC

## 2019-08-07 NOTE — Op Note (Signed)
[place] Patient Name: Andres Taylor Procedure Date: 08/07/2019 10:14 AM MRN: [id number] Endoscopist: Milus Banister , MD Age: 74 Referring MD:  Date of Birth: 11-Mar-1946 Gender: Male Account #: [id number] Procedure:                Colonoscopy Indications:              Heme positive stool Medicines:                Monitored Anesthesia Care Procedure:                Pre-Anesthesia Assessment:                           - Prior to the procedure, a History and Physical                            was performed, and patient medications and                            allergies were reviewed. [name] tolerance of                            previous anesthesia was also reviewed. The risks                            and benefits of the procedure and the sedation                            options and risks were discussed with the patient.                            All questions were answered, and informed consent                            was obtained. Prior Anticoagulants: The patient has                            taken no previous anticoagulant or antiplatelet                            agents. ASA Grade Assessment: III - A patient with                            severe systemic disease. After reviewing the risks                            and benefits, the patient was deemed in                            satisfactory condition to undergo the procedure.                           After obtaining informed consent, the colonoscope  was passed under direct vision. Throughout the                            procedure, the patient's blood pressure, pulse, and                            oxygen saturations were monitored continuously. The                            Colonoscope was introduced through the anus and                            advanced to the the cecum, identified by                            appendiceal orifice and ileocecal valve. The                          colonoscopy was performed without difficulty. The                            patient tolerated the procedure well. The quality                            of the bowel preparation was good. The ileocecal                            valve, appendiceal orifice, and rectum were                            photographed. Scope In: 10:16:17 AM Scope Out: 10:37:43 AM Scope Withdrawal Time: 0 hours 14 minutes 14 seconds  Total Procedure Duration: 0 hours 21 minutes 26 seconds  Findings:                 Two sessile polyps were found in the sigmoid colon                            and hepatic flexure. The polyps were 6 to 10 mm in                            size. These polyps were removed with a cold snare.                            Resection and retrieval were complete.                           Multiple small and large-mouthed diverticula were                            found in the left colon.                           The exam was otherwise without abnormality on  direct and retroflexion views. Complications:            No immediate complications. Estimated blood loss:                            None. Estimated Blood Loss:     Estimated blood loss: none. Impression:               - Two 6 to 10 mm polyps in the sigmoid colon and at                            the hepatic flexure, removed with a cold snare.                            Resected and retrieved.                           - Diverticulosis in the left colon.                           - The examination was otherwise normal on direct                            and retroflexion views. Recommendation:           - EGD now.                           - Await pathology results. Milus Banister, MD 08/07/2019 10:41:54 AM This report has been signed electronically.

## 2019-08-07 NOTE — Op Note (Signed)
[place] Patient Name: Andres Taylor Procedure Date: 08/07/2019 10:14 AM MRN: [id number] Endoscopist: Milus Banister , MD Age: 74 Referring MD:  Date of Birth: 1945-12-06 Gender: Male Account #: [id number] Procedure:                Upper GI endoscopy Indications:              Heme positive stool, dark stools several months ago Medicines:                Monitored Anesthesia Care Procedure:                Pre-Anesthesia Assessment:                           - Prior to the procedure, a History and Physical                            was performed, and patient medications and                            allergies were reviewed. [name] tolerance of                            previous anesthesia was also reviewed. The risks                            and benefits of the procedure and the sedation                            options and risks were discussed with the patient.                            All questions were answered, and informed consent                            was obtained. Prior Anticoagulants: The patient has                            taken no previous anticoagulant or antiplatelet                            agents. ASA Grade Assessment: III - A patient with                            severe systemic disease. After reviewing the risks                            and benefits, the patient was deemed in                            satisfactory condition to undergo the procedure.                           After obtaining informed consent, the endoscope was  passed under direct vision. Throughout the                            procedure, the patient's blood pressure, pulse, and                            oxygen saturations were monitored continuously. The                            Endoscope was introduced through the mouth, and                            advanced to the second part of duodenum. The upper                            GI  endoscopy was accomplished without difficulty.                            The patient tolerated the procedure well. Scope In: Scope Out: Findings:                 Irregular, non-nodular Z line. Biopsied taken and                            sent to pathology.                           Mild inflammation characterized by erythema,                            friability and granularity was found in the gastric                            antrum. Biopsies were taken with a cold forceps for                            histology.                           Duodenitis (erythema, granularity, erosion) within                            the duodenal bulb. Biopsies taken and sent to                            pathology.                           The exam was otherwise without abnormality. Complications:            No immediate complications. Estimated blood loss:                            None. Estimated Blood Loss:     Estimated blood loss: none. Impression:               - Irregular Z-line, biopsied.                           -  Gastritis and duodenitis, biopsied.                           - The examination was otherwise normal. Recommendation:           - Patient has a contact number available for                            emergencies. The signs and symptoms of potential                            delayed complications were discussed with the                            patient. Return to normal activities tomorrow.                            Written discharge instructions were provided to the                            patient.                           - Resume previous diet.                           - Continue present medications.                           - Await pathology results. Milus Banister, MD 08/07/2019 10:55:31 AM This report has been signed electronically.

## 2019-08-07 NOTE — Patient Instructions (Signed)
HANDOUTS PROVIDED ON: GASTRITIS, POLYPS,  DIVERTICULOSIS  The polyps removed/biopsies taken today have been sent for pathology.  The results can take 1-3 weeks to receive.  When your next colonoscopy should occur will be based on the pathology results.    You may resume your previous diet and medication schedule.  Thank you for allowing [place] to care for you today!!!  YOU HAD AN ENDOSCOPIC PROCEDURE TODAY AT [place]:   Refer to the procedure report that was given to you for any specific questions about what was found during the examination.  If the procedure report does not answer your questions, please call your gastroenterologist to clarify.  If you requested that your care partner not be given the details of your procedure findings, then the procedure report has been included in a sealed envelope for you to review at your convenience later.  YOU SHOULD EXPECT: Some feelings of bloating in the abdomen. Passage of more gas than usual.  Walking can help get rid of the air that was put into your GI tract during the procedure and reduce the bloating. If you had a lower endoscopy (such as a colonoscopy or flexible sigmoidoscopy) you may notice spotting of blood in your stool or on the toilet paper. If you underwent a bowel prep for your procedure, you may not have a normal bowel movement for a few days.  Please Note:  You might notice some irritation and congestion in your nose or some drainage.  This is from the oxygen used during your procedure.  There is no need for concern and it should clear up in a day or so.  SYMPTOMS TO REPORT IMMEDIATELY:   Following lower endoscopy (colonoscopy or flexible sigmoidoscopy):  Excessive amounts of blood in the stool  Significant tenderness or worsening of abdominal pains  Swelling of the abdomen that is new, acute  Fever of 100F or higher   Following upper endoscopy (EGD)  Vomiting of blood or coffee ground material  New chest pain or  pain under the shoulder blades  Painful or persistently difficult swallowing  New shortness of breath  Fever of 100F or higher  Black, tarry-looking stools  For urgent or emergent issues, a gastroenterologist can be reached at any hour by calling [phone].   DIET:  We do recommend a small meal at first, but then you may proceed to your regular diet.  Drink plenty of fluids but you should avoid alcoholic beverages for 24 hours.  ACTIVITY:  You should plan to take it easy for the rest of today and you should NOT DRIVE or use heavy machinery until tomorrow (because of the sedation medicines used during the test).    FOLLOW UP: Our staff will call the number listed on your records 48-72 hours following your procedure to check on you and address any questions or concerns that you may have regarding the information given to you following your procedure. If we do not reach you, we will leave a message.  We will attempt to reach you two times.  During this call, we will ask if you have developed any symptoms of COVID 19. If you develop any symptoms (ie: fever, flu-like symptoms, shortness of breath, cough etc.) before then, please call [phone].  If you test positive for Covid 19 in the 2 weeks post procedure, please call and report this information to [place].    If any biopsies were taken you will be contacted by phone or by letter within  the next 1-3 weeks.  Please call us at [phone] if you have not heard about the biopsies in 3 weeks.    SIGNATURES/CONFIDENTIALITY: You and/or your care partner have signed paperwork which will be entered into your electronic medical record.  These signatures attest to the fact that that the information above on your After Visit Summary has been reviewed and is understood.  Full responsibility of the confidentiality of this discharge information lies with you and/or your care-partner.

## 2019-08-07 NOTE — Progress Notes (Signed)
Called to room to assist during endoscopic procedure.  Patient ID and intended procedure confirmed with present staff. Received instructions for my participation in the procedure from the performing physician.  

## 2019-08-07 NOTE — Progress Notes (Signed)
PT taken to PACU. Monitors in place. VSS. Report given to RN. 

## 2019-08-07 NOTE — Progress Notes (Signed)
Pt's states no medical or surgical changes since previsit or office visit. Temp by JB. VS by DT.

## 2019-08-09 ENCOUNTER — Telehealth: Payer: Self-pay | Admitting: *Deleted

## 2019-08-09 NOTE — Telephone Encounter (Signed)
Attempted f/u phone call. No answer. Left message. °

## 2019-08-09 NOTE — Telephone Encounter (Signed)
Message left

## 2019-08-10 ENCOUNTER — Telehealth: Payer: Self-pay | Admitting: Nurse Practitioner

## 2019-08-10 ENCOUNTER — Telehealth: Payer: Self-pay | Admitting: Adult Health

## 2019-08-10 NOTE — Telephone Encounter (Signed)
Elmo Putt [id number] from Dr Venancio Poisson [facility] called because they need surgery clearance for pt that can be verbal or written for surgery for hernia repair. She said it can also be put into epic. Please advise

## 2019-08-10 NOTE — Telephone Encounter (Signed)
Called [facility] and spoke with [place]. She states pt is needing pulmonary clearance for laparoscopic BIL inguinal hernia repair. There isnt a scheduled surgery date for this yet. Pt was last seen in 06/2019 by Rexene Edison, NP.   TP please advise if you are able to clear pt for surgery. [facility] states it is ok to document in phone note for clearance as they can view Epic.

## 2019-08-10 NOTE — Telephone Encounter (Signed)
We have clearance from cardiology, but also need clearance from pulmonology. I see they reached out to that office, so I will wait for that response.

## 2019-08-10 NOTE — Telephone Encounter (Signed)
Spoke with representative and she will fax over the form for surgical clearance for [place] to fill out prior to schedule the surgery.   Waiting for the form.

## 2019-08-11 ENCOUNTER — Other Ambulatory Visit: Payer: Self-pay

## 2019-08-11 ENCOUNTER — Encounter: Payer: Self-pay | Admitting: Gastroenterology

## 2019-08-11 MED ORDER — OMEPRAZOLE 40 MG PO CPDR: 40.0000 mg | DELAYED_RELEASE_CAPSULE | Freq: Every day | ORAL | 3 refills | Status: DC

## 2019-08-11 NOTE — Telephone Encounter (Signed)
Called [facility], spoke with Sunday Spillers as Elmo Putt was unavailable.  Informed Sunday Spillers that the clearance has been documented in the phone note created yesterday [id number] for Elmo Putt to view.  Nothing further needed; will sign off.

## 2019-08-11 NOTE — Telephone Encounter (Signed)
Recently seen in office / Patient is cleared for surgery from pulmonary standpoint  - he is a moderate surgical risk from pulmonary stand point as he has multiple co-morbidities along with age .   He has asthma , restrictive lung disease . Is not on oxygen and is independent.   Discussed this with patient and will send to surgeon. Dr. Johney Maine .    Major Pulmonary risks identified in the multifactorial risk analysis are but not limited to a) pneumonia; b) recurrent intubation risk; c) prolonged or recurrent acute respiratory failure needing mechanical ventilation; d) prolonged hospitalization; e) DVT/Pulmonary embolism; f) Acute Pulmonary edema  Recommend 1. Short duration of surgery as much as possible and avoid paralytic if possible 2. Recovery in step down or ICU with Pulmonary consultation if indicated.  3. Has mild CPAP , may need CPAP post op  4.  DVT prophylaxis if appropriate.  5. Aggressive pulmonary toilet with o2, bronchodilatation, and incentive spirometry and early ambulation   [name] Parrett NP-C  [facility]  08/11/2019

## 2019-08-13 ENCOUNTER — Other Ambulatory Visit: Payer: Self-pay | Admitting: Nurse Practitioner

## 2019-08-13 DIAGNOSIS — E782 Mixed hyperlipidemia: Secondary | ICD-10-CM

## 2019-08-13 MED ORDER — ATORVASTATIN CALCIUM 10 MG PO TABS: 10.0000 mg | ORAL_TABLET | Freq: Every day | ORAL | 1 refills | Status: DC

## 2019-08-13 NOTE — Progress Notes (Signed)
Please remind him about need for blood draw

## 2019-08-14 ENCOUNTER — Telehealth: Payer: Self-pay | Admitting: Nurse Practitioner

## 2019-08-14 NOTE — Progress Notes (Signed)
Pt has an appt 08/15/2019

## 2019-08-14 NOTE — Telephone Encounter (Signed)
Medical clearance letter written

## 2019-08-14 NOTE — Telephone Encounter (Signed)
Form faxed

## 2019-08-14 NOTE — Telephone Encounter (Signed)
Form faxed and send to scan.

## 2019-08-15 ENCOUNTER — Other Ambulatory Visit (INDEPENDENT_AMBULATORY_CARE_PROVIDER_SITE_OTHER): Payer: Medicare Other

## 2019-08-15 ENCOUNTER — Other Ambulatory Visit: Payer: Self-pay

## 2019-08-15 DIAGNOSIS — E1169 Type 2 diabetes mellitus with other specified complication: Secondary | ICD-10-CM | POA: Diagnosis not present

## 2019-08-15 DIAGNOSIS — E782 Mixed hyperlipidemia: Secondary | ICD-10-CM | POA: Diagnosis not present

## 2019-08-15 LAB — LIPID PANEL
Cholesterol: 106 mg/dL (ref 0–200)
HDL: 35.7 mg/dL — ABNORMAL LOW (ref >39.00)
LDL Cholesterol: 51 mg/dL (ref 0–99)
NonHDL: 70.44
Total CHOL/HDL Ratio: 3
Triglycerides: 98 mg/dL (ref 0.0–149.0)
VLDL: 19.6 mg/dL (ref 0.0–40.0)

## 2019-08-15 LAB — HEPATIC FUNCTION PANEL
ALT: 21 U/L (ref 0–53)
AST: 18 U/L (ref 0–37)
Albumin: 4.4 g/dL (ref 3.5–5.2)
Alkaline Phosphatase: 66 U/L (ref 39–117)
Bilirubin, Direct: 0.1 mg/dL (ref 0.0–0.3)
Total Bilirubin: 0.5 mg/dL (ref 0.2–1.2)
Total Protein: 7 g/dL (ref 6.0–8.3)

## 2019-08-15 LAB — MICROALBUMIN / CREATININE URINE RATIO
Creatinine,U: 89.8 mg/dL
Microalb Creat Ratio: 0.8 mg/g (ref 0.0–30.0)
Microalb, Ur: <0.7 mg/dL (ref 0.0–1.9)

## 2019-08-15 LAB — HEMOGLOBIN A1C: Hgb A1c MFr Bld: 6.6 % — ABNORMAL HIGH (ref 4.6–6.5)

## 2019-08-21 ENCOUNTER — Ambulatory Visit: Payer: Self-pay | Admitting: Surgery

## 2019-08-22 DIAGNOSIS — H2513 Age-related nuclear cataract, bilateral: Secondary | ICD-10-CM | POA: Diagnosis not present

## 2019-08-22 DIAGNOSIS — H25043 Posterior subcapsular polar age-related cataract, bilateral: Secondary | ICD-10-CM | POA: Diagnosis not present

## 2019-08-27 ENCOUNTER — Ambulatory Visit: Payer: Medicare Other | Attending: Internal Medicine

## 2019-08-27 DIAGNOSIS — Z23 Encounter for immunization: Secondary | ICD-10-CM | POA: Insufficient documentation

## 2019-08-27 NOTE — Progress Notes (Signed)
   Covid-19 Vaccination Clinic  Name:  Andres Taylor    MRN: [id number] DOB: [birth year]  08/27/2019  [name] was observed post Covid-19 immunization for 15 minutes without incidence. He was provided with Vaccine Information Sheet and instruction to access the V-Safe system.   [name] was instructed to call 911 with any severe reactions post vaccine: [name] Difficulty breathing  . Swelling of your face and throat  . A fast heartbeat  . A bad rash all over your body  . Dizziness and weakness    Immunizations Administered    Name Date Dose VIS Date Route   Pfizer COVID-19 Vaccine 08/27/2019 12:24 PM 0.3 mL 06/23/2019 Intramuscular   Manufacturer: [place]   Lot: [id number]   [place]: [id number]

## 2019-09-13 ENCOUNTER — Other Ambulatory Visit: Payer: Self-pay

## 2019-09-13 ENCOUNTER — Encounter (HOSPITAL_BASED_OUTPATIENT_CLINIC_OR_DEPARTMENT_OTHER): Payer: Self-pay | Admitting: Surgery

## 2019-09-13 NOTE — Progress Notes (Signed)
Anesthesia Review: No  PCP: Andres Robson NP last office visit 08/13/19 in epic Cardiologist : Dr. Roni Taylor last office note and clearance 06/25/2019 in epic Pulmnologist: T. Parrett NP office visit and clearance 08/10/19 in epic Chest x-ray :05/16/19 in epic EKG :06/19/19 in epic Stress test:06/15/19 in epic Echo :04/21/19 in epic Cardiac Cath : greater than 2 years Sleep Study/ CPAP : 07/12/19 in epic Fasting Blood Sugar :  N/A    / Checks Blood Sugar -- times a day:  Does not check at home last Hgb A1C 6.6 08/15/19 in peic Blood Thinner/ Instructions /Last Dose:N/A ASA / Instructions/ Last Dose :  Has not been informed to stop Aspirin  Patient denies shortness of breath, chest pain, fever, and cough at this phone interview.

## 2019-09-13 NOTE — Progress Notes (Addendum)
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 that wife could come and sit in waiting room) NPO:  No food after midnight/Clear liquids until 4:30 AM DOS Arrival time: 53AM Lab needs dos---- Istat 8         Lab results------N/A COVID test ------3/8 at 1130A Medications to take morning of surgery: Allopurinol, Omeprazole, Nasal Spray, Inhaler Pre op orders in epic: Yes Diabetic medication -----N/A Patient Special Instructions -----Bring prescription medications in original containers day of surgery, Bring CPAP machine and supplies Pre-Op special Istructions -----N/A Ride home: OWER-[name] (wife) [phone]  Patient verbalized understanding of instructions that were given at this phone interview. Patient denies shortness of breath, chest pain, fever, cough a this phone interview.

## 2019-09-17 ENCOUNTER — Other Ambulatory Visit: Payer: Self-pay | Admitting: Cardiology

## 2019-09-18 ENCOUNTER — Other Ambulatory Visit (HOSPITAL_COMMUNITY)
Admission: RE | Admit: 2019-09-18 | Discharge: 2019-09-18 | Disposition: A | Discharge status: Home / Self Care | Payer: Medicare Other | Source: Ambulatory Visit | Attending: Surgery | Admitting: Surgery

## 2019-09-18 DIAGNOSIS — Z01812 Encounter for preprocedural laboratory examination: Secondary | ICD-10-CM | POA: Diagnosis not present

## 2019-09-18 DIAGNOSIS — Z20822 Contact with and (suspected) exposure to covid-19: Secondary | ICD-10-CM | POA: Insufficient documentation

## 2019-09-19 LAB — SARS CORONAVIRUS 2 (TAT 6-24 HRS): SARS Coronavirus 2: NEGATIVE

## 2019-09-20 NOTE — Anesthesia Preprocedure Evaluation (Addendum)
Anesthesia Evaluation  Patient identified by MRN, date of birth, ID band Patient awake    Reviewed: Allergy & Precautions, NPO status , Patient's Chart, lab work & pertinent test results  History of Anesthesia Complications Negative for: history of anesthetic complications  Airway Mallampati: II  TM Distance: >3 FB Neck ROM: Full    Dental  (+) Dental Advisory Given, Teeth Intact   Pulmonary sleep apnea and Continuous Positive Airway Pressure Ventilation , COPD,  COPD inhaler, former smoker,  09/18/2019 SARS coronavirus NEG Restrictive lung disease   breath sounds clear to auscultation       Cardiovascular hypertension, Pt. on medications (-) angina Rhythm:Regular Rate:Normal  06/2019 Stress: EF 70%, no ST-T changes 04/2019 ECHO: EF 60-65%, mild aortic sclerosis without stenosis, valves OK   Neuro/Psych Chronic back pain    GI/Hepatic Neg liver ROS, GERD  Medicated and Controlled,  Endo/Other  diabetes (glu 118), Oral Hypoglycemic AgentsMorbid obesity  Renal/GU Renal InsufficiencyRenal disease     Musculoskeletal  (+) Arthritis ,   Abdominal (+) + obese,   Peds  Hematology negative hematology ROS (+)   Anesthesia Other Findings   Reproductive/Obstetrics                            Anesthesia Physical Anesthesia Plan  ASA: III  Anesthesia Plan: General   Post-op Pain Management:    Induction: Intravenous  PONV Risk Score and Plan: 2 and Ondansetron and Dexamethasone  Airway Management Planned: Oral ETT  Additional Equipment:   Intra-op Plan:   Post-operative Plan: Extubation in OR  Informed Consent: I have reviewed the patients History and Physical, chart, labs and discussed the procedure including the risks, benefits and alternatives for the proposed anesthesia with the patient or authorized representative who has indicated his/her understanding and acceptance.     Dental  advisory given  Plan Discussed with: CRNA and Surgeon  Anesthesia Plan Comments:        Anesthesia Quick Evaluation

## 2019-09-21 ENCOUNTER — Ambulatory Visit (HOSPITAL_BASED_OUTPATIENT_CLINIC_OR_DEPARTMENT_OTHER)
Admission: RE | Admit: 2019-09-21 | Discharge: 2019-09-21 | Disposition: A | Discharge status: Home / Self Care | Payer: Medicare Other | Attending: Surgery | Admitting: Surgery

## 2019-09-21 ENCOUNTER — Encounter (HOSPITAL_BASED_OUTPATIENT_CLINIC_OR_DEPARTMENT_OTHER)
Admission: RE | Disposition: A | Discharge status: Home / Self Care | Payer: Self-pay | Source: Home / Self Care | Attending: Surgery

## 2019-09-21 ENCOUNTER — Ambulatory Visit (HOSPITAL_BASED_OUTPATIENT_CLINIC_OR_DEPARTMENT_OTHER): Payer: Medicare Other | Admitting: Anesthesiology

## 2019-09-21 ENCOUNTER — Encounter (HOSPITAL_BASED_OUTPATIENT_CLINIC_OR_DEPARTMENT_OTHER): Payer: Self-pay | Admitting: Surgery

## 2019-09-21 ENCOUNTER — Other Ambulatory Visit: Payer: Self-pay

## 2019-09-21 DIAGNOSIS — K4090 Unilateral inguinal hernia, without obstruction or gangrene, not specified as recurrent: Secondary | ICD-10-CM | POA: Diagnosis not present

## 2019-09-21 DIAGNOSIS — I272 Pulmonary hypertension, unspecified: Secondary | ICD-10-CM | POA: Diagnosis not present

## 2019-09-21 DIAGNOSIS — D176 Benign lipomatous neoplasm of spermatic cord: Secondary | ICD-10-CM | POA: Insufficient documentation

## 2019-09-21 DIAGNOSIS — Z7984 Long term (current) use of oral hypoglycemic drugs: Secondary | ICD-10-CM | POA: Insufficient documentation

## 2019-09-21 DIAGNOSIS — Z87891 Personal history of nicotine dependence: Secondary | ICD-10-CM | POA: Diagnosis not present

## 2019-09-21 DIAGNOSIS — I129 Hypertensive chronic kidney disease with stage 1 through stage 4 chronic kidney disease, or unspecified chronic kidney disease: Secondary | ICD-10-CM | POA: Insufficient documentation

## 2019-09-21 DIAGNOSIS — Z6837 Body mass index (BMI) 37.0-37.9, adult: Secondary | ICD-10-CM | POA: Insufficient documentation

## 2019-09-21 DIAGNOSIS — G4733 Obstructive sleep apnea (adult) (pediatric): Secondary | ICD-10-CM | POA: Insufficient documentation

## 2019-09-21 DIAGNOSIS — K219 Gastro-esophageal reflux disease without esophagitis: Secondary | ICD-10-CM | POA: Insufficient documentation

## 2019-09-21 DIAGNOSIS — E782 Mixed hyperlipidemia: Secondary | ICD-10-CM | POA: Insufficient documentation

## 2019-09-21 DIAGNOSIS — Z79899 Other long term (current) drug therapy: Secondary | ICD-10-CM | POA: Diagnosis not present

## 2019-09-21 DIAGNOSIS — K4091 Unilateral inguinal hernia, without obstruction or gangrene, recurrent: Secondary | ICD-10-CM | POA: Insufficient documentation

## 2019-09-21 DIAGNOSIS — T884XXA Failed or difficult intubation, initial encounter: Secondary | ICD-10-CM

## 2019-09-21 DIAGNOSIS — Z7982 Long term (current) use of aspirin: Secondary | ICD-10-CM | POA: Diagnosis not present

## 2019-09-21 DIAGNOSIS — N182 Chronic kidney disease, stage 2 (mild): Secondary | ICD-10-CM | POA: Diagnosis not present

## 2019-09-21 DIAGNOSIS — K4020 Bilateral inguinal hernia, without obstruction or gangrene, not specified as recurrent: Secondary | ICD-10-CM

## 2019-09-21 DIAGNOSIS — K660 Peritoneal adhesions (postprocedural) (postinfection): Secondary | ICD-10-CM | POA: Diagnosis not present

## 2019-09-21 DIAGNOSIS — J309 Allergic rhinitis, unspecified: Secondary | ICD-10-CM | POA: Diagnosis not present

## 2019-09-21 DIAGNOSIS — Z9089 Acquired absence of other organs: Secondary | ICD-10-CM | POA: Diagnosis not present

## 2019-09-21 DIAGNOSIS — H9190 Unspecified hearing loss, unspecified ear: Secondary | ICD-10-CM | POA: Diagnosis not present

## 2019-09-21 DIAGNOSIS — J449 Chronic obstructive pulmonary disease, unspecified: Secondary | ICD-10-CM | POA: Insufficient documentation

## 2019-09-21 DIAGNOSIS — Z7951 Long term (current) use of inhaled steroids: Secondary | ICD-10-CM | POA: Diagnosis not present

## 2019-09-21 DIAGNOSIS — M109 Gout, unspecified: Secondary | ICD-10-CM | POA: Diagnosis not present

## 2019-09-21 DIAGNOSIS — E1122 Type 2 diabetes mellitus with diabetic chronic kidney disease: Secondary | ICD-10-CM | POA: Diagnosis not present

## 2019-09-21 HISTORY — DX: Chronic kidney disease, stage 2 (mild): N18.2

## 2019-09-21 HISTORY — DX: Benign neoplasm of right bronchus and lung: D14.31

## 2019-09-21 HISTORY — DX: Obstructive sleep apnea (adult) (pediatric): G47.33

## 2019-09-21 HISTORY — DX: Unspecified asthma, uncomplicated: J45.909

## 2019-09-21 HISTORY — DX: Personal history of urinary calculi: Z87.442

## 2019-09-21 HISTORY — DX: Chronic cough: R05.3

## 2019-09-21 HISTORY — DX: Unilateral inguinal hernia, without obstruction or gangrene, not specified as recurrent: K40.90

## 2019-09-21 HISTORY — DX: Presence of external hearing-aid: Z97.4

## 2019-09-21 HISTORY — DX: Other forms of dyspnea: R06.09

## 2019-09-21 HISTORY — DX: Presence of spectacles and contact lenses: Z97.3

## 2019-09-21 HISTORY — DX: Other disorders of lung: J98.4

## 2019-09-21 HISTORY — DX: Dyspnea, unspecified: R06.00

## 2019-09-21 HISTORY — DX: Unspecified glaucoma: H40.9

## 2019-09-21 HISTORY — DX: Gastro-esophageal reflux disease without esophagitis: K21.9

## 2019-09-21 HISTORY — DX: Obstructive sleep apnea (adult) (pediatric): Z99.89

## 2019-09-21 HISTORY — PX: INGUINAL HERNIA REPAIR: SHX194

## 2019-09-21 LAB — POCT I-STAT, CHEM 8
BUN: 21 mg/dL (ref 8–23)
Calcium, Ion: 1.13 mmol/L — ABNORMAL LOW (ref 1.15–1.40)
Chloride: 99 mmol/L (ref 98–111)
Creatinine, Ser: 1.2 mg/dL (ref 0.61–1.24)
Glucose, Bld: 118 mg/dL — ABNORMAL HIGH (ref 70–99)
HCT: 39 % (ref 39.0–52.0)
Hemoglobin: 13.3 g/dL (ref 13.0–17.0)
Potassium: 3.8 mmol/L (ref 3.5–5.1)
Sodium: 138 mmol/L (ref 135–145)
TCO2: 29 mmol/L (ref 22–32)

## 2019-09-21 LAB — GLUCOSE, CAPILLARY: Glucose-Capillary: 238 mg/dL — ABNORMAL HIGH (ref 70–99)

## 2019-09-21 SURGERY — REPAIR, HERNIA, INGUINAL, BILATERAL, LAPAROSCOPIC
Anesthesia: General | Site: Abdomen | Laterality: Bilateral

## 2019-09-21 MED ORDER — PROPOFOL 10 MG/ML IV BOLUS
INTRAVENOUS | Status: DC | PRN
  Administered 2019-09-21: 50 mg via INTRAVENOUS
  Administered 2019-09-21: 150 mg via INTRAVENOUS
  Administered 2019-09-21: 50 mg via INTRAVENOUS

## 2019-09-21 MED ORDER — MIDAZOLAM HCL 2 MG/2ML IJ SOLN
0.5000 mg | Freq: Once | INTRAMUSCULAR | Status: DC | PRN
  Filled 2019-09-21: qty 2

## 2019-09-21 MED ORDER — LIDOCAINE HCL (CARDIAC) PF 100 MG/5ML IV SOSY
PREFILLED_SYRINGE | INTRAVENOUS | Status: DC | PRN
  Administered 2019-09-21: 40 mg via INTRAVENOUS

## 2019-09-21 MED ORDER — ROCURONIUM BROMIDE 100 MG/10ML IV SOLN
INTRAVENOUS | Status: DC | PRN
  Administered 2019-09-21: 70 mg via INTRAVENOUS
  Administered 2019-09-21: 30 mg via INTRAVENOUS
  Administered 2019-09-21: 20 mg via INTRAVENOUS

## 2019-09-21 MED ORDER — DIPHENHYDRAMINE HCL 50 MG/ML IJ SOLN
INTRAMUSCULAR | Status: AC
  Filled 2019-09-21: qty 1

## 2019-09-21 MED ORDER — PHENYLEPHRINE HCL (PRESSORS) 10 MG/ML IV SOLN
INTRAVENOUS | Status: AC
  Filled 2019-09-21: qty 1

## 2019-09-21 MED ORDER — ACETAMINOPHEN 500 MG PO TABS
1000.0000 mg | ORAL_TABLET | ORAL | Status: AC
  Administered 2019-09-21: 1000 mg via ORAL
  Filled 2019-09-21: qty 2

## 2019-09-21 MED ORDER — BUPIVACAINE LIPOSOME 1.3 % IJ SUSP
20.0000 mL | Freq: Once | INTRAMUSCULAR | Status: DC
  Filled 2019-09-21: qty 20

## 2019-09-21 MED ORDER — ROCURONIUM BROMIDE 10 MG/ML (PF) SYRINGE
PREFILLED_SYRINGE | INTRAVENOUS | Status: AC
  Filled 2019-09-21: qty 10

## 2019-09-21 MED ORDER — GABAPENTIN 300 MG PO CAPS
300.0000 mg | ORAL_CAPSULE | ORAL | Status: AC
  Administered 2019-09-21: 300 mg via ORAL
  Filled 2019-09-21: qty 1

## 2019-09-21 MED ORDER — ACETAMINOPHEN 500 MG PO TABS
ORAL_TABLET | ORAL | Status: AC
  Filled 2019-09-21: qty 2

## 2019-09-21 MED ORDER — EPHEDRINE SULFATE 50 MG/ML IJ SOLN
INTRAMUSCULAR | Status: DC | PRN
  Administered 2019-09-21: 20 mg via INTRAVENOUS

## 2019-09-21 MED ORDER — CEFAZOLIN SODIUM-DEXTROSE 2-4 GM/100ML-% IV SOLN
2.0000 g | INTRAVENOUS | Status: AC
  Administered 2019-09-21: 2 g via INTRAVENOUS
  Filled 2019-09-21: qty 100

## 2019-09-21 MED ORDER — DIPHENHYDRAMINE HCL 50 MG/ML IJ SOLN
INTRAMUSCULAR | Status: DC | PRN
  Administered 2019-09-21: 25 mg via INTRAVENOUS

## 2019-09-21 MED ORDER — KETOROLAC TROMETHAMINE 30 MG/ML IJ SOLN
INTRAMUSCULAR | Status: AC
  Filled 2019-09-21: qty 1

## 2019-09-21 MED ORDER — FENTANYL CITRATE (PF) 100 MCG/2ML IJ SOLN
25.0000 ug | INTRAMUSCULAR | Status: DC | PRN
  Filled 2019-09-21: qty 1

## 2019-09-21 MED ORDER — CHLORHEXIDINE GLUCONATE CLOTH 2 % EX PADS
6.0000 | MEDICATED_PAD | Freq: Once | CUTANEOUS | Status: DC
  Filled 2019-09-21: qty 6

## 2019-09-21 MED ORDER — LIDOCAINE 2% (20 MG/ML) 5 ML SYRINGE
INTRAMUSCULAR | Status: AC
  Filled 2019-09-21: qty 5

## 2019-09-21 MED ORDER — SUGAMMADEX SODIUM 200 MG/2ML IV SOLN
INTRAVENOUS | Status: DC | PRN
  Administered 2019-09-21: 200 mg via INTRAVENOUS

## 2019-09-21 MED ORDER — BUPIVACAINE-EPINEPHRINE 0.25% -1:200000 IJ SOLN
INTRAMUSCULAR | Status: DC | PRN
  Administered 2019-09-21: 20 mL

## 2019-09-21 MED ORDER — TRAMADOL HCL 50 MG PO TABS: 50.0000 mg | ORAL_TABLET | Freq: Four times a day (QID) | ORAL | 0 refills | Status: DC | PRN

## 2019-09-21 MED ORDER — GLYCOPYRROLATE 0.2 MG/ML IJ SOLN
INTRAMUSCULAR | Status: DC | PRN
  Administered 2019-09-21: .2 mg via INTRAVENOUS

## 2019-09-21 MED ORDER — PHENYLEPHRINE 40 MCG/ML (10ML) SYRINGE FOR IV PUSH (FOR BLOOD PRESSURE SUPPORT)
PREFILLED_SYRINGE | INTRAVENOUS | Status: AC
  Filled 2019-09-21: qty 10

## 2019-09-21 MED ORDER — CEFAZOLIN SODIUM-DEXTROSE 2-4 GM/100ML-% IV SOLN
INTRAVENOUS | Status: AC
  Filled 2019-09-21: qty 100

## 2019-09-21 MED ORDER — PROMETHAZINE HCL 25 MG/ML IJ SOLN
6.2500 mg | INTRAMUSCULAR | Status: DC | PRN
  Filled 2019-09-21: qty 1

## 2019-09-21 MED ORDER — ONDANSETRON HCL 4 MG/2ML IJ SOLN
INTRAMUSCULAR | Status: DC | PRN
  Administered 2019-09-21: 4 mg via INTRAVENOUS

## 2019-09-21 MED ORDER — PROPOFOL 10 MG/ML IV BOLUS
INTRAVENOUS | Status: AC
  Filled 2019-09-21: qty 40

## 2019-09-21 MED ORDER — GABAPENTIN 300 MG PO CAPS
ORAL_CAPSULE | ORAL | Status: AC
  Filled 2019-09-21: qty 1

## 2019-09-21 MED ORDER — FENTANYL CITRATE (PF) 100 MCG/2ML IJ SOLN
INTRAMUSCULAR | Status: DC | PRN
  Administered 2019-09-21: 25 ug via INTRAVENOUS
  Administered 2019-09-21: 50 ug via INTRAVENOUS
  Administered 2019-09-21: 100 ug via INTRAVENOUS
  Administered 2019-09-21: 25 ug via INTRAVENOUS

## 2019-09-21 MED ORDER — KETOROLAC TROMETHAMINE 30 MG/ML IJ SOLN
INTRAMUSCULAR | Status: DC | PRN
  Administered 2019-09-21: 15 mg via INTRAVENOUS

## 2019-09-21 MED ORDER — SODIUM CHLORIDE 0.9 % IV SOLN
INTRAVENOUS | Status: DC | PRN
  Administered 2019-09-21: 100 ug/min via INTRAVENOUS

## 2019-09-21 MED ORDER — BUPIVACAINE LIPOSOME 1.3 % IJ SUSP
INTRAMUSCULAR | Status: DC | PRN
  Administered 2019-09-21: 20 mL

## 2019-09-21 MED ORDER — DEXAMETHASONE SODIUM PHOSPHATE 4 MG/ML IJ SOLN
INTRAMUSCULAR | Status: DC | PRN
  Administered 2019-09-21: 10 mg via INTRAVENOUS

## 2019-09-21 MED ORDER — GLYCOPYRROLATE PF 0.2 MG/ML IJ SOSY
PREFILLED_SYRINGE | INTRAMUSCULAR | Status: AC
  Filled 2019-09-21: qty 1

## 2019-09-21 MED ORDER — EPHEDRINE 5 MG/ML INJ
INTRAVENOUS | Status: AC
  Filled 2019-09-21: qty 10

## 2019-09-21 MED ORDER — LACTATED RINGERS IV SOLN
INTRAVENOUS | Status: DC
  Filled 2019-09-21 (×2): qty 1000

## 2019-09-21 MED ORDER — PHENYLEPHRINE HCL (PRESSORS) 10 MG/ML IV SOLN
INTRAVENOUS | Status: DC | PRN
  Administered 2019-09-21 (×4): 80 ug via INTRAVENOUS
  Administered 2019-09-21: 40 ug via INTRAVENOUS
  Administered 2019-09-21: 80 ug via INTRAVENOUS
  Administered 2019-09-21: 40 ug via INTRAVENOUS

## 2019-09-21 MED ORDER — DEXAMETHASONE SODIUM PHOSPHATE 10 MG/ML IJ SOLN
INTRAMUSCULAR | Status: AC
  Filled 2019-09-21: qty 1

## 2019-09-21 MED ORDER — ONDANSETRON HCL 4 MG/2ML IJ SOLN
INTRAMUSCULAR | Status: AC
  Filled 2019-09-21: qty 2

## 2019-09-21 MED ORDER — MEPERIDINE HCL 25 MG/ML IJ SOLN
6.2500 mg | INTRAMUSCULAR | Status: DC | PRN
  Filled 2019-09-21: qty 1

## 2019-09-21 MED ORDER — FENTANYL CITRATE (PF) 100 MCG/2ML IJ SOLN
INTRAMUSCULAR | Status: AC
  Filled 2019-09-21: qty 2

## 2019-09-21 SURGICAL SUPPLY — 45 items
APPLICATOR COTTON TIP 6 STRL (MISCELLANEOUS) ×1 IMPLANT
APPLICATOR COTTON TIP 6IN STRL (MISCELLANEOUS) ×3
CABLE HIGH FREQUENCY MONO STRZ (ELECTRODE) ×3 IMPLANT
CANISTER SUCT 3000ML PPV (MISCELLANEOUS) IMPLANT
CHLORAPREP W/TINT 26 (MISCELLANEOUS) ×3 IMPLANT
COVER WAND RF STERILE (DRAPES) ×3 IMPLANT
DECANTER SPIKE VIAL GLASS SM (MISCELLANEOUS) ×3 IMPLANT
DERMABOND ADVANCED (GAUZE/BANDAGES/DRESSINGS) ×2
DERMABOND ADVANCED .7 DNX12 (GAUZE/BANDAGES/DRESSINGS) ×1 IMPLANT
DEVICE SECURE STRAP 25 ABSORB (INSTRUMENTS) IMPLANT
DRAPE WARM FLUID 44X44 (DRAPES) ×3 IMPLANT
DRSG TEGADERM 2-3/8X2-3/4 SM (GAUZE/BANDAGES/DRESSINGS) ×3 IMPLANT
DRSG TEGADERM 4X4.75 (GAUZE/BANDAGES/DRESSINGS) ×3 IMPLANT
ELECT REM PT RETURN 9FT ADLT (ELECTROSURGICAL) ×3
ELECTRODE REM PT RTRN 9FT ADLT (ELECTROSURGICAL) ×1 IMPLANT
GAUZE 4X4 16PLY RFD (DISPOSABLE) ×3 IMPLANT
GLOVE ECLIPSE 8.0 STRL XLNG CF (GLOVE) ×3 IMPLANT
GLOVE INDICATOR 8.0 STRL GRN (GLOVE) ×3 IMPLANT
GOWN STRL REUS W/TWL XL LVL3 (GOWN DISPOSABLE) ×3 IMPLANT
IRRIG SUCT STRYKERFLOW 2 WTIP (MISCELLANEOUS) ×3
IRRIGATION SUCT STRKRFLW 2 WTP (MISCELLANEOUS) ×1 IMPLANT
KIT TURNOVER CYSTO (KITS) ×3 IMPLANT
MANIFOLD NEPTUNE II (INSTRUMENTS) ×3 IMPLANT
MESH HERNIA 6X6 BARD (Mesh General) ×3 IMPLANT
MESH HERNIA BARD 6X6 (Mesh General) ×6 IMPLANT
NEEDLE HYPO 22GX1.5 SAFETY (NEEDLE) ×3 IMPLANT
NS IRRIG 500ML POUR BTL (IV SOLUTION) ×6 IMPLANT
PACK BASIN DAY SURGERY FS (CUSTOM PROCEDURE TRAY) ×3 IMPLANT
PAD POSITIONING PINK XL (MISCELLANEOUS) ×3 IMPLANT
SCISSORS LAP 5X35 DISP (ENDOMECHANICALS) ×3 IMPLANT
SET TUBE SMOKE EVAC HIGH FLOW (TUBING) ×3 IMPLANT
SLEEVE ADV FIXATION 5X100MM (TROCAR) ×3 IMPLANT
SPONGE GAUZE 2X2 8PLY STER LF (GAUZE/BANDAGES/DRESSINGS) ×1
SPONGE GAUZE 2X2 8PLY STRL LF (GAUZE/BANDAGES/DRESSINGS) ×2 IMPLANT
SUT MNCRL AB 4-0 PS2 18 (SUTURE) ×3 IMPLANT
SUT PDS AB 1 CT1 27 (SUTURE) ×6 IMPLANT
SUT VIC AB 2-0 SH 27 (SUTURE) ×2
SUT VIC AB 2-0 SH 27XBRD (SUTURE) ×1 IMPLANT
SUT VICRYL 0 UR6 27IN ABS (SUTURE) IMPLANT
TOWEL OR 17X26 10 PK STRL BLUE (TOWEL DISPOSABLE) ×3 IMPLANT
TRAY DSU PREP LF (CUSTOM PROCEDURE TRAY) IMPLANT
TRAY LAPAROSCOPIC (CUSTOM PROCEDURE TRAY) ×3 IMPLANT
TROCAR ADV FIXATION 5X100MM (TROCAR) ×3 IMPLANT
TROCAR XCEL BLUNT TIP 100MML (ENDOMECHANICALS) ×3 IMPLANT
WATER STERILE IRR 500ML POUR (IV SOLUTION) IMPLANT

## 2019-09-21 NOTE — Op Note (Signed)
09/21/2019  10:43 AM  PATIENT:  Andres Taylor  74 y.o. 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  PROCEDURE:   LAPAROSCOPIC BILATERAL INGUINAL HERNIA REPAIRS LAPAROSCOPIC LYSIS OF ADHESIONS X 90 MIN (1/2 CASE) TAP BLOCK - BILATERAL  SURGEON:  [name], MD  ASSISTANT: None  ANESTHESIA:     Regional ilioinguinal and genitofemoral and spermatic cord nerve blocks  General  Nerve block provided with liposomal bupivacaine (Experel) mixed with 0.25% bupivacaine as a Bilateral TAP block x 61mL each side at the level of the transverse abdominis & preperitoneal spaces along the flank at the anterior axillary line, from subcostal ridge to iliac crest under laparoscopic guidance    EBL:  Total I/O In: -  Out: 20 [Blood:20].  See anesthesia record  Delay start of Pharmacological VTE agent (>24hrs) due to surgical blood loss or risk of bleeding:  no  DRAINS: NONE  SPECIMEN: Spermatic cord lipomas and large right hernia sac and (not sent)  DISPOSITION OF SPECIMEN:  N/A  COUNTS:  YES  PLAN OF CARE: Discharge to home after PACU  PATIENT DISPOSITION:  PACU - hemodynamically stable.  INDICATION: Pleasant gentleman with obvious large right groin bulging consistent with inguinal hernia.  Recalls having a left inguinal hernia repair done when he was a toddler.  Concern for possible recurrence there as well.  History of prior open appendectomy.  Offered  laparoscopic possible open exploration and repair of hernias found  The anatomy & physiology of the abdominal wall and pelvic floor was discussed.  The pathophysiology of hernias in the inguinal and pelvic region was discussed.  Natural history risks such as progressive enlargement, pain, incarceration & strangulation was discussed.   Contributors to complications such as smoking, obesity, diabetes, prior surgery, etc were discussed.    I feel the risks of no intervention will lead to serious problems that outweigh the operative risks; therefore, I recommended surgery to reduce and repair the hernia.  I explained laparoscopic techniques with possible need for an open approach.  I noted usual use of mesh to patch and/or buttress hernia repair  Risks such as bleeding, infection, abscess, need for further treatment, heart attack, death, and other risks were discussed.  I noted a good likelihood this will help address the problem.   Goals of post-operative recovery were discussed as well.  Possibility that this will not correct all symptoms was explained.  I stressed the importance of low-impact activity, aggressive pain control, avoiding constipation, & not pushing through pain to minimize risk of post-operative chronic pain or injury. Possibility of reherniation was discussed.  We will work to minimize complications.     An educational handout further explaining the pathology & treatment options was given as well.  Questions were answered.  The patient expresses understanding & wishes to proceed with surgery.  OR FINDINGS: Large indirect right inguinal hernia with component of femoral hernia as well.  No direct space hernia or obturator hernia.  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  Skeletonized and excised and removed.  Left  side.  Probable indirect inguinal primary repair in the past.  Direct space inguinal hernia.  No femoral or obturator hernias.  DESCRIPTION:  The patient was  identified & brought into the operating room. The patient was positioned supine with arms tucked. SCDs were active during the entire case. The patient underwent general anesthesia without any difficulty.  The abdomen was prepped and draped in a sterile fashion. [name] bladder was emptied.  A Surgical Timeout confirmed our plan.  I made a transverse incision through the inferior umbilical fold.  I made a small transverse nick through the anterior rectus fascia contralateral to the inguinal hernia side and placed a 0-vicryl stitch through the fascia.  I placed a Hasson trocar into the preperitoneal plane.  Entry was clean.  We induced carbon dioxide insufflation. Camera inspection revealed no injury.  I used a 13mm angled scope to bluntly free the peritoneum off the infraumbilical anterior abdominal wall.  I created enough of a preperitoneal pocket to place 87mm ports into the right & left mid-abdomen into this preperitoneal cavity.  I focused attention on the RIGHT pelvis since that was the dominant hernia side.   I used blunt & focused sharp dissection to free the peritoneum off the flank and down to the pubic rim.  I freed the anteriolateral bladder wall off the anteriolateral pelvic wall, sparing midline attachments.   I located a swath of peritoneum going into a hernia fascial defect at the  internal ring consistent with  an indirect inguinal hernia..  I gradually freed the peritoneal hernia sac off safely and reduced it into the preperitoneal space.  I opened up the hernia sac to confirm anatomy and make sure that all contents were reduced out of the inguinal hernia.  I freed the peritoneum off the spermatic vessels & vas deferens.  I freed peritoneum off the retroperitoneum along the psoas muscle.  Spermatic cord lipoma was dissected away & removed.  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  Removed that up the central from the  local fascial defect.  I primarily repaired the peritoneal hernia sac defect for a good high ligation using 2-0 Vicryl using laparoscopic intracorporeal suturing.  I checked & assured hemostasis.     I turned attention on the opposite  LEFT pelvis.  I did dissection in a similar, mirror-image fashion. The patient had a direct space inguinal hernia.[name]   Spermatic cord lipoma was dissected away & removed.    I checked & assured hemostasis.     I chose 15x15 cm sheets of standard medium weight polypropylene mesh (Bard), one for each side.  I cut a single sigmoid-shaped slit ~6cm from a corner of each mesh.  I placed the meshes into the preperitoneal space & laid them as overlapping diamonds such that at the inferior points, a 6x6 cm corner flap rested in the true anterolateral pelvis, covering the obturator & femoral foramina.   I allowed the bladder to return to the pubis, this helping tuck the corners of the mesh in the anteriolateral pelvis.  The medial corners overlapped each other across midline cephalad to the pubic rim.   Given the numerous hernias of moderate size, I placed a third 15x15cm mesh in the center as a vertical diamond.  The lateral wings of the mesh overlap across the direct spaces and internal rings where the dominant hernias were.  This provided good coverage and reinforcement of the hernia  repairs.  Because of the central mesh placement with good overlap, I did not place any tacks.   I held the hernia sacs cephalad & evacuated carbon dioxide.  I closed the buccal fascia with #1 PDS absorbable suture.  I closed the skin using 4-0 monocryl stitch.  Sterile dressings were applied.   The patient was extubated & arrived in the PACU in stable condition..  I had discussed postoperative care with the patient in the holding area.  Instructions are written in the chart.  I discussed operative findings, updated the patient's status, discussed probable steps to recovery, and gave postoperative  recommendations to the patient's spouse.  Recommendations were made.  Questions were answered.  She expressed understanding & appreciation.   [name], M.D., F.A.C.S. Gastrointestinal and Minimally Invasive Surgery [facility] [address], [place], [place] [address] [phone] Main / Paging  09/21/2019 10:43 AM

## 2019-09-21 NOTE — H&P (Signed)
Andres Taylor  DOB: 1946/03/13 Married / Language: English / Race: White Male  Patient Care Team: Nche, Charlene Brooke, NP as PCP - General (Internal Medicine) Leonie Man, MD as PCP - Cardiology (Cardiology) Leonie Man, MD as Consulting Physician (Cardiology) Michael Boston, MD as Consulting Physician (General Surgery) Melrose Nakayama, MD as Consulting Physician (Cardiothoracic Surgery) Parrett, Fonnie Mu, NP as Nurse Practitioner (Pulmonary Disease)  ` ` Patient sent for surgical consultation at the request of Wilfred Lacy, NP [facility]  Chief Complaint: Bilateral inguinal hernias. Right greater than left. ` ` The patient is a pleasant gentleman who comes today with a walker. Apparently he had an inguinal hernia repaired when he was an infant at 47 months of age. He is having some crampy abdominal pain of concern. Concern for possible orchiectomy distant placed on a short course of Cipro. CT scan was done 31st of August. It showed right greater than left inguinal hernias. Surgical consultation requested.  Patient comes today with a walker. Usually uses a little bit for balance but has a very painful left ankle that he feels is due to a gout flare. He is relatively active and independent despite his chronic low back pain and obesity. He had to have chest surgery done for a lung mass. Turnout to be benign. Had cardiac catheterization done before hand that was mostly underwhelming by Dr. Ellyn Hack with cardiology back in 2014. Patient notes he's had an inguinal hernia for a while but was recommended to just follow with according to his prior primary care physician. His main abdominal complaints were epigastric and not really in the right lower quadrant her lower abdomen. His epigastric pain is gone away. With the moderately large inguinal hernia noted on recent CAT scan, reevaluation reconsidered. Patient bleeds Cipro course and feels like his  testicles are not that bothersome now, although later he confessed they've always been very sensitive. He is a diabetic on oral hypoglycemics. His A1c is always been in the sixes. His wife is a retired Marine scientist and wondered if he needed surgery. There is some concern about ulcers, so he taken off his diclofenac. He notes he had better pain control with that, but was compliant to minimize the risk of ulcers.  (Review of systems as stated in this history (HPI) or in the review of systems. Otherwise all other 12 point ROS are negative) ` ` `   Allergies (Tanisha A. Owens Shark, [place]; 03/27/2019 1:57 PM) No Known Drug Allergies [03/27/2019]: Allergies Reconciled  Medication History (Tanisha A. Owens Shark, RMA; 03/27/2019 2:00 PM) Allopurinol (100MG  Tablet, Oral) Active. Aspirin (81MG  Tablet, Oral) Active. Atorvastatin Calcium (10MG  Tablet, Oral) Active. Breo Ellipta (200-25MCG/INH Aero Pow Br Act, Inhalation) Active. Ciprofloxacin HCl (500MG  Tablet, Oral) Active. Colchicine (0.6MG  Capsule, Oral) Active. Docusate Sodium (100MG  Tablet, Oral) Active. Flonase (50MCG/DOSE Inhaler, Nasal) Active. Mucinex (600MG  Tablet ER 12HR, Oral) Active. hydroCHLOROthiazide (12.5MG  Tablet, Oral) Active. Lisinopril (20MG  Tablet, Oral) Active. metFORMIN HCl (850MG  Tablet, Oral) Active. Robaxin (500MG  Tablet, Oral) Active. Singulair (10MG  Tablet, Oral) Active. Pantoprazole Sodium (20MG  Tablet DR, Oral) Active. ProAir RespiClick ([id number] (90 Base)MCG/ACT Aero Pow Br Act, Inhalation) Active. Medications Reconciled    Vitals (Tanisha A. Brown RMA; 03/27/2019 1:57 PM) 03/27/2019 1:56 PM Weight: 237.4 lb Height: 67.5in Body Surface Area: 2.19 m Body Mass Index: 36.63 kg/m  Temp.: 98.79F  Pulse: 95 (Regular)  BP: 132/84 (Sitting, Left Arm, Standard)   BP 129/63   Pulse 72   Temp (!) 97.5 F (36.4 C) (  Oral)   Resp 16   Ht 5' 7.5" (1.715 m)   Wt 108.9 kg   SpO2 97%   BMI 37.03  kg/m       Physical Exam [name] MD; 03/27/2019 5:26 PM)  General Mental Status-Alert. General Appearance-Not in acute distress, Not Sickly. Orientation-Oriented X3. Hydration-Well hydrated. Voice-Normal.  Integumentary Global Assessment Upon inspection and palpation of skin surfaces of the - Axillae: non-tender, no inflammation or ulceration, no drainage. and Distribution of scalp and body hair is normal. General Characteristics Temperature - normal warmth is noted.  Head and Neck Head-normocephalic, atraumatic with no lesions or palpable masses. Face Global Assessment - atraumatic, no absence of expression. Neck Global Assessment - no abnormal movements, no bruit auscultated on the right, no bruit auscultated on the left, no decreased range of motion, non-tender. Trachea-midline. Thyroid Gland Characteristics - non-tender.  Eye Eyeball - Left-Extraocular movements intact, No Nystagmus - Left. Eyeball - Right-Extraocular movements intact, No Nystagmus - Right. Cornea - Left-No Hazy - Left. Cornea - Right-No Hazy - Right. Sclera/Conjunctiva - Left-No scleral icterus, No Discharge - Left. Sclera/Conjunctiva - Right-No scleral icterus, No Discharge - Right. Pupil - Left-Direct reaction to light normal. Pupil - Right-Direct reaction to light normal.  ENMT Ears Pinna - Left - no drainage observed, no generalized tenderness observed. Pinna - Right - no drainage observed, no generalized tenderness observed. Nose and Sinuses External Inspection of the Nose - no destructive lesion observed. Inspection of the nares - Left - quiet respiration. Inspection of the nares - Right - quiet respiration. Mouth and Throat Lips - Upper Lip - no fissures observed, no pallor noted. Lower Lip - no fissures observed, no pallor noted. Nasopharynx - no discharge present. Oral Cavity/Oropharynx - Tongue - no dryness observed. Oral Mucosa - no  cyanosis observed. Hypopharynx - no evidence of airway distress observed. Note: Hard of hearing  Chest and Lung Exam Inspection Movements - Normal and Symmetrical. Accessory muscles - No use of accessory muscles in breathing. Palpation Palpation of the chest reveals - Non-tender. Auscultation Breath sounds - Normal and Clear.  Cardiovascular Auscultation Rhythm - Regular. Murmurs & Other Heart Sounds - Auscultation of the heart reveals - No Murmurs and No Systolic Clicks.  Abdomen Inspection Inspection of the abdomen reveals - No Visible peristalsis and No Abnormal pulsations. Umbilicus - No Bleeding, No Urine drainage. Palpation/Percussion Palpation and Percussion of the abdomen reveal - Soft, Non Tender, No Rebound tenderness, No Rigidity (guarding) and No Cutaneous hyperesthesia. Note: Abdomen soft. Morbidly obese. Moderate suprapubic umbilical diastases recti but no hernia. No umbilical hernia. Right lower quadrant vertical paramedian incision but no hernia there. Nontender. Not distended. No umbilical or incisional hernias. No guarding.  Male Genitourinary Sexual Maturity Tanner 5 - Adult hair pattern and Adult penile size and shape. Note: Right greater than left inguinal hernias. Right-sided sensitive to cough and Valsalva and does come down into the upper scrotum. Testicles larger than average on both sides very sensitive to light touch with some swelling around it. 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 Discomfort as he bulges with coughing on his right groin  Peripheral Vascular Upper Extremity Inspection - Left - No Cyanotic nailbeds - Left, Not Ischemic. Inspection - Right - No Cyanotic nailbeds - Right, Not Ischemic.  Neurologic Neurologic evaluation reveals -normal attention span and ability to concentrate, able to  name objects and  repeat phrases. Appropriate fund of knowledge , normal sensation and normal coordination. Mental Status Affect - not angry, not paranoid. Cranial Nerves-Normal Bilaterally. Gait-Normal.  Neuropsychiatric Mental status exam performed with findings of-able to articulate well with normal speech/language, rate, volume and coherence, thought content normal with ability to perform basic computations and apply abstract reasoning and no evidence of hallucinations, delusions, obsessions or homicidal/suicidal ideation.  Musculoskeletal Global Assessment Spine, Ribs and Pelvis - no instability, subluxation or laxity. Right Upper Extremity - no instability, subluxation or laxity.  Lymphatic Head & Neck  General Head & Neck Lymphatics: Bilateral - Description - No Localized lymphadenopathy. Axillary  General Axillary Region: Bilateral - Description - No Localized lymphadenopathy. Femoral & Inguinal  Generalized Femoral & Inguinal Lymphatics: Left - Description - No Localized lymphadenopathy. Right - Description - No Localized lymphadenopathy.    Assessment & Plan   BILATERAL INGUINAL HERNIA WITHOUT OBSTRUCTION OR GANGRENE, RECURRENCE NOT SPECIFIED (K40.20) Impression: Right greater than left inguinal hernias. Right-sided sensitive. No sensitive is his testicles.  Because there moderate size and seen to be more noticeable, I recommend surgical repair. Ideally laparoscopic preperitoneal approach. He suspects his wife, who is a Marine scientist, will agree with this recommendation  Usually people can go home that day. May have to watch him overnight depending. He is a diabetic that is well controlled. He has no sleep apnea. He tolerated lung wedge resection chest surgery 5 years ago without much difficulty. NO h/o urinary issues   The anatomy & physiology of the abdominal wall and pelvic floor was discussed. The pathophysiology of hernias in the inguinal and pelvic region was discussed.  Natural history risks such as progressive enlargement, pain, incarceration, and strangulation was discussed. Contributors to complications such as smoking, obesity, diabetes, prior surgery, etc were discussed.  I feel the risks of no intervention will lead to serious problems that outweigh the operative risks; therefore, I recommended surgery to reduce and repair the hernia. I explained laparoscopic techniques with possible need for an open approach. I noted usual use of mesh to patch and/or buttress hernia repair  Risks such as bleeding, infection, abscess, need for further treatment, heart attack, death, and other risks were discussed. I noted a good likelihood this will help address the problem. Goals of post-operative recovery were discussed as well. Possibility that this will not correct all symptoms was explained. I stressed the importance of low-impact activity, aggressive pain control, avoiding constipation, & not pushing through pain to minimize risk of post-operative chronic pain or injury. Possibility of reherniation was discussed. We will work to minimize complications.  An educational handout further explaining the pathology & treatment options was given as well. Questions were answered. The patient expresses understanding & wishes to proceed with surgery.   HOH (HARD OF HEARING) (H91.90)  [name], MD, FACS, MASCRS Gastrointestinal and Minimally Invasive Surgery    [address], [place], [place] [address] [phone] Main / Paging [phone] Fax

## 2019-09-21 NOTE — Anesthesia Postprocedure Evaluation (Signed)
Anesthesia Post Note  Patient: Andres Taylor  Procedure(s) Performed: LAPAROSCOPIC BILATERAL INGUINAL HERNIA REPAIR, LYSIS OF ADHESIONS (Bilateral Abdomen)     Patient location during evaluation: PACU Anesthesia Type: General Level of consciousness: awake and alert, patient cooperative and oriented Pain management: pain level controlled Vital Signs Assessment: post-procedure vital signs reviewed and stable Respiratory status: spontaneous breathing, nonlabored ventilation and respiratory function stable Cardiovascular status: blood pressure returned to baseline and stable Postop Assessment: no apparent nausea or vomiting Anesthetic complications: no Comments: Discussed difficult intubation, with successful VideoGlide, with pt and his wife. Written explanation given in discharge instructions    Last Vitals:  Vitals:   09/21/19 1330 09/21/19 1345  BP: 96/62 100/63  Pulse: 81 85  Resp: 11 18  Temp:    SpO2: 91% 96%    Last Pain:  Vitals:   09/21/19 1415  TempSrc:   PainSc: 0-No pain                 Zorian Gunderman,E. Philena Obey

## 2019-09-21 NOTE — Interval H&P Note (Signed)
History and Physical Interval Note:  09/21/2019 7:38 AM  Andres Taylor  has presented today for surgery, with the diagnosis of bilateral inguinal new hernias.  The various methods of treatment have been discussed with the patient and family. After consideration of risks, benefits and other options for treatment, the patient has consented to  Procedure(s): [place] (Bilateral) as a surgical intervention.  [name] history has been reviewed, patient examined, no change in status, stable for surgery.  I have reviewed the patient's chart and labs.  Questions were answered to the patient's satisfaction.     Andres Taylor  I have re-reviewed the the patient's records, history, medications, and allergies.  I have re-examined the patient.  I again discussed intraoperative plans and goals of post-operative recovery.  The patient agrees to proceed.  [place]  August 31, 1945 [id number]  Patient Care Team: Nche, Charlene Brooke, NP as PCP - General (Internal Medicine) Leonie Man, MD as PCP - Cardiology (Cardiology) Leonie Man, MD as Consulting Physician (Cardiology) Michael Boston, MD as Consulting Physician (General Surgery) Melrose Nakayama, MD as Consulting Physician (Cardiothoracic Surgery) Melvenia Needles, NP as Nurse Practitioner (Pulmonary Disease)  Patient Active Problem List   Diagnosis Date Noted   Asthma 05/25/2019   Abdominal bloating 03/13/2019   Indigestion 03/13/2019   Heme positive stool 03/13/2019   Orchitis, right 03/13/2019   Generalized abdominal pain 03/13/2019   Chronic cough 09/08/2018   Lumbar radiculopathy, right 06/07/2018   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   CKD (chronic kidney disease) stage 2, GFR 60-89 ml/min 05/17/2018   OSA (obstructive sleep apnea) 05/17/2018   Restrictive lung disease 05/17/2018   Cervical radiculopathy 05/17/2018   Diabetic  neuropathy associated with type 2 diabetes mellitus ([place]) 05/17/2018   DM (diabetes mellitus), type 2 ([place]) 05/17/2018   Gout 05/17/2018   Dermatochalasis of both upper eyelids 02/25/2017   Acute angle-closure glaucoma of both eyes 12/04/2016   Hyperopia with astigmatism and presbyopia, bilateral 12/01/2016   Nuclear sclerotic cataract of both eyes 12/01/2016   Posterior subcapsular age-related cataract of both eyes 12/01/2016   Vitreous floater, bilateral 12/01/2016   Lung mass 08/30/2013   Moderate obesity 04/18/2013   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[place]) 04/18/2013   Pulmonary HTN ([place]) 04/18/2013   Pre-operative cardiovascular examination 04/18/2013   Other and unspecified coagulation defects 04/18/2013   Dyspnea on exertion 04/17/2013   Leg edema    Mixed hyperlipidemia    HTN (hypertension), benign     Right upper lobe lung nodule 03/10/2013    Past Medical History:  Diagnosis Date   Allergic rhinitis    Allergy    Arthritis    Asthma    Blood in urine    Cataract    forming    Chronic cough    CKD (chronic kidney disease), stage II    DDD (degenerative disc disease)    Diabetes mellitus ([place])    DOE (dyspnea on exertion)    Eustachian tube dysfunction    Fatty liver    GERD (gastroesophageal reflux disease)    Glaucoma    Gout    Hard of hearing    bilateral    History of cardiomegaly 05/16/2019   Noted on CXR   History of kidney stones    Hyperlipidemia    Hypertension    Inguinal hernia    Bilateral   Joint pain, knee    Leg edema  Lung tumor (benign), right    Right upper lobe lung mass   Obesity    OSA on CPAP    Pneumonitis    right lung   Restrictive lung disease    Sciatica    Spinal stenosis    Wears contact lenses    Wears glasses    Wears hearing aid in both ears     Past Surgical History:  Procedure Laterality Date   APPENDECTOMY  1976   COLONOSCOPY  08/2010   COLONOSCOPY  08/07/2019   HEMORRHOID SURGERY  2001   HERNIA REPAIR     as a  baby 36 months old   LEFT AND RIGHT HEART CATHETERIZATION WITH CORONARY ANGIOGRAM N/A 04/20/2013   Procedure: LEFT AND RIGHT HEART CATHETERIZATION WITH CORONARY ANGIOGRAM;  Surgeon: Leonie Man, MD;  Location: [facility] CATH LAB;  Angiographically normal Coronary Arteries - L Dom Cx (OM1, small LPL1 then LPL2 & PDA) & wraparound LAD (D1-3 small).  Borderline CO/CI - 4.94/2.16 by both Fick & TD.  RAP ~14 mmHg, RVEDP 15 mmHg.  LVEDP 19 mmHg (->diastolic dysfunction) PCWP 16 mmHg, but PA Mean 25 mmHg   LITHOTRIPSY     x 2   LOBECTOMY Right    lumbar disectomy L4-5  1980   NM MYOVIEW LTD  06/15/2019    EF 70%.  No ischemia or infarction.  LOW RISK    TRANSTHORACIC ECHOCARDIOGRAM  04/21/2019   EF 60 to 65%.  Mild LVH.  Normal RV.  Normal atrial size.  Mild aortic sclerosis.  No stenosis. Lipomatous intra-atrial septum   UPPER GI ENDOSCOPY  08/07/2019   VIDEO ASSISTED THORACOSCOPY (VATS)/WEDGE RESECTION Right 08/30/2013   Procedure: VIDEO ASSISTED THORACOSCOPY (VATS)/WEDGE RESECTION;  Surgeon: Melrose Nakayama, MD;  Location: [facility] OR;  Service: Thoracic;  Laterality: Right;    Social History   Socioeconomic History   Marital status: Married    Spouse name: Not on file   Number of children: 3   Years of education: Not on file   Highest education level: High school graduate  Occupational History   Occupation: Real Air traffic controller: [name]  Tobacco Use   Smoking status: Former Smoker    Packs/day: 1.50    Years: 42.00    Pack years: 63.00    Types: Cigarettes    Quit date: 07/13/2002    Years since quitting: 17.2   Smokeless tobacco: Former Systems developer  Substance and Sexual Activity   Alcohol use: Yes    Alcohol/week: 3.0 standard drinks    Types: 3 Cans of beer per week    Comment: 3 tmes weekly   Drug use: No   Sexual activity: Not on file  Other Topics Concern   Not on file  Social History Narrative   He and his first wife divorced in 20 14/2015.  He is now  remarried and lives with his new wife.   He has 2 adult children-aged 9 and 24 (he had 3 children, 1 of his sons--actually the twin brother of 58 of the existing sons, died after complications of redo heart transplant following Adriamycin toxicity from leukemia as a child, he died at age 29.     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   He quit smoking in 2004 after 50+ pack year smoking history.  He occasionally drinks alcohol.   Social Determinants of Health   Financial Resource Strain:    Difficulty of Paying Living Expenses:  Food Insecurity:    Worried About Charity fundraiser in the Last Year:    Arboriculturist in the Last Year:   Transportation Needs:    Film/video editor (Medical):    Lack of Transportation (Non-Medical):   Physical Activity:    Days of Exercise per Week:    Minutes of Exercise per Session:   Stress:    Feeling of Stress :   Social Connections:    Frequency of Communication with Friends and Family:    Frequency of Social Gatherings with Friends and Family:    Attends Religious Services:    Active Member of Clubs or Organizations:    Attends Music therapist:    Marital Status:   Intimate Partner Violence:    Fear of Current or Ex-Partner:    Emotionally Abused:    Physically Abused:    Sexually Abused:     Family History  Problem Relation Age of Onset   Leukemia Father    Breast cancer Maternal Grandmother    Cancer Maternal Grandmother        breast cancer   Stroke Maternal Grandmother    Hypertension Maternal Grandfather    Stroke Maternal Grandfather    Stroke Mother    Cancer Son        Ewing's sarcoma   Heart failure Son        Related to Adriamycin toxicity.  Had 2 consecutive heart transplants   AAA (abdominal aortic aneurysm) Son    Stroke Paternal Grandmother    Colon cancer Neg Hx    Esophageal cancer Neg Hx    Colon polyps Neg Hx    Rectal cancer Neg Hx    Stomach cancer Neg Hx      Medications Prior to Admission  Medication Sig Dispense Refill Last Dose   allopurinol (ZYLOPRIM) 100 MG tablet TAKE 1 TABLET BY MOUTH DAILY (Patient taking differently: every morning. Take 1 tablet by mouth daily) 90 tablet 1 09/21/2019 at Unknown time   aspirin EC 81 MG tablet Take 81 mg by mouth daily.   09/20/2019 at Unknown time   atorvastatin (LIPITOR) 10 MG tablet Take 1 tablet (10 mg total) by mouth daily at 6 PM. 90 tablet 1 09/20/2019 at Unknown time   BREO ELLIPTA 200-25 MCG/INH AEPB INHALE 1 PUFF INTO THE LUNGS DAILY 1 each 5 09/20/2019 at Unknown time   cetirizine (ZYRTEC) 10 MG chewable tablet Chew 10 mg by mouth daily.   09/21/2019 at Unknown time   Coenzyme Q10 (COQ10) 100 MG CAPS Take 100 mg by mouth 3 (three) times daily. 30 capsule  Past Week at Unknown time   colchicine 0.6 MG tablet Take 0.6 mg by mouth 2 (two) times daily as needed (for gout). PRN for Gout   Past Month at Unknown time   diltiazem (CARDIZEM CD) 180 MG 24 hr capsule TAKE 1 CAPSULE(180 MG) BY MOUTH DAILY 30 capsule 6 09/21/2019 at Unknown time   fluticasone (FLONASE) 50 MCG/ACT nasal spray as needed.   5 Past Week at Unknown time   gabapentin (NEURONTIN) 300 MG capsule Take 1 capsule (300 mg total) by mouth 2 (two) times daily. 180 capsule 3 Past Week at Unknown time   hydrochlorothiazide (MICROZIDE) 12.5 MG capsule Take 1 capsule (12.5 mg total) by mouth daily as needed (For swelling). (Patient taking differently: Take 12.5 mg by mouth daily. ) 30 capsule 1 09/21/2019 at Unknown time   ipratropium (ATROVENT) 0.06 % nasal spray  INSTILL 2 SPRAYS INTO THE NOSE FOUR TIMES DAILY 30 mL 3 Past Month at Unknown time   metFORMIN (GLUCOPHAGE) 850 MG tablet TAKE 1 TABLET(850 MG) BY MOUTH TWICE DAILY WITH A MEAL 180 tablet 1 09/20/2019 at Unknown time   methocarbamol (ROBAXIN) 500 MG tablet Take 1 tablet (500 mg total) by mouth every 12 (twelve) hours as needed for muscle spasms. 180 tablet 0 Past Month at Unknown time    montelukast (SINGULAIR) 10 MG tablet Take 1 tablet (10 mg total) by mouth at bedtime. (Patient taking differently: Take 10 mg by mouth as needed. ) 90 tablet 3 Past Week at Unknown time   Multiple Vitamin (MULTIVITAMIN) tablet Take 1 tablet by mouth daily.   Past Week at Unknown time   olmesartan (BENICAR) 20 MG tablet Take 1 tablet (20 mg total) by mouth daily. 90 tablet 1 09/21/2019 at Unknown time   omega-3 acid ethyl esters (LOVAZA) 1 g capsule Take 2 capsules (2 g total) by mouth 2 (two) times daily. 360 capsule 1 Past Week at Unknown time   omeprazole (PRILOSEC) 40 MG capsule Take 1 capsule (40 mg total) by mouth daily. 90 capsule 3 09/20/2019 at Unknown time   PROAIR RESPICLICK 123XX123 (90 Base) MCG/ACT AEPB INHALE 2 PUFFS EVERY 4-6 HOURS AS NEEDED FOR COUGH/ WHEEZE 1 each 1 Past Week at Unknown time   albuterol (PROVENTIL) (2.5 MG/3ML) 0.083% nebulizer solution Take 3 mLs (2.5 mg total) by nebulization every 6 (six) hours as needed for wheezing or shortness of breath. (Patient not taking: Reported on 09/13/2019) 360 mL 1 Not Taking at Unknown time   guaiFENesin (MUCINEX) 600 MG 12 hr tablet Take 600 mg by mouth daily.   More than a month at Unknown time    Current Facility-Administered Medications  Medication Dose Route Frequency Provider Last Rate Last Admin   bupivacaine liposome (EXPAREL) 1.3 % injection 266 mg  20 mL Infiltration Once Michael Boston, MD       bupivacaine liposome (EXPAREL) 1.3 % injection    PRN Michael Boston, MD   20 mL at 09/21/19 0732   bupivacaine-EPINEPHrine (MARCAINE W/ EPI) 0.25% -1:200000 (with pres) injection    PRN Michael Boston, MD   20 mL at 09/21/19 0732   ceFAZolin (ANCEF) IVPB 2g/100 mL premix  2 g Intravenous On Call to OR Michael Boston, MD       Chlorhexidine Gluconate Cloth 2 % PADS 6 each  6 each Topical Once Michael Boston, MD       And   Chlorhexidine Gluconate Cloth 2 % PADS 6 each  6 each Topical Once Michael Boston, MD       lactated ringers infusion    Intravenous Continuous Lidia Collum, MD 50 mL/hr at 09/21/19 0629 New Bag at 09/21/19 0629     Not on File  BP 129/63   Pulse 72   Temp (!) 97.5 F (36.4 C) (Oral)   Resp 16   Ht 5' 7.5" (1.715 m)   Wt 108.9 kg   SpO2 97%   BMI 37.03 kg/m   Labs: Results for orders placed or performed during the hospital encounter of 09/21/19 (from the past 48 hour(s))  I-STAT, chem 8     Status: Abnormal   Collection Time: 09/21/19  6:18 AM  Result Value Ref Range   Sodium 138 135 - 145 mmol/L   Potassium 3.8 3.5 - 5.1 mmol/L   Chloride 99 98 - 111 mmol/L   BUN 21 8 -  23 mg/dL   Creatinine, Ser 1.20 0.61 - 1.24 mg/dL   Glucose, Bld 118 (H) 70 - 99 mg/dL    Comment: Glucose reference range applies only to samples taken after fasting for at least 8 hours.   Calcium, Ion 1.13 (L) 1.15 - 1.40 mmol/L   TCO2 29 22 - 32 mmol/L   Hemoglobin 13.3 13.0 - 17.0 g/dL   HCT 39.0 39.0 - 52.0 %    Imaging / Studies: No results found.   Andres Taylor, M.D., F.A.C.S. Gastrointestinal and Minimally Invasive Surgery [facility] [address], [place], [place] [address] [phone] Main / Paging  09/21/2019 7:38 AM

## 2019-09-21 NOTE — Discharge Instructions (Addendum)
September 21, 2019  Marily Memos [facility] [address] [place] [address]   Dear Mr. Strohmeyer,   I hope you are doing well after your surgery.  Included with this note is the letter you should receive after your surgery. I think it is always a good idea to let the anesthesia team know about previous difficult intubations.  This will letter help you do that for future surgeries.  During your recent procedure at [facility], your anesthesia team determined that you were a difficult intubation.  This means that there was difficulty in placing a breathing tube from your mouth into your windpipe.  This procedure is commonly performed for general anesthetics for the purpose of providing oxygen and anesthetic gases during the operation.  Routinely, this is done using an instrument, known as a laryngoscope, to visualize the vocal cords and directly place the breathing tube into the trachea. We were not able to visualize your larynx well with this conventional equipment, however we did so very easily with a "Glidescope."  You were very safe and stable during the intubation and subsequent surgery. Our technique is documented in our anesthetic record.  This record is available should others need to review it. If you have any questions about the procedure, please let me know and I will be happy to help.    It is very important for you to tell your Anesthesiologist when you have future operations, that you are a difficult intubation so that other arrangements, personnel, or instruments can be obtained to ensure your safety. We recommend that you obtain and wear a Medical Alert Bracelet in case an emergency arises.  There are many companies who will sell you such a bracelet in many styles.  They are easily found on the internet by searching "Medical Alert Bracelet."  Yours should have the words "Difficult Intubation" written on the bracelet.   It was a pleasure to take care of you for your surgery, and I hope the best  for you in your recovery and the future. Should you have any questions, please feel free to contact me at [phone].   Respectfully,  Annye Asa, MD  [place]: POST OP INSTRUCTIONS  ######################################################################  EAT Gradually transition to a high fiber diet with a fiber supplement over the next few weeks after discharge.  Start with a pureed / full liquid diet (see below)  WALK Walk an hour a day.  Control your pain to do that.    CONTROL PAIN Control pain so that you can walk, sleep, tolerate sneezing/coughing, and go up/down stairs.  HAVE A BOWEL MOVEMENT DAILY Keep your bowels regular to avoid problems.  OK to try a laxative to override constipation.  OK to use an antidairrheal to slow down diarrhea.  Call if not better after 2 tries  CALL IF YOU HAVE PROBLEMS/CONCERNS Call if you are still struggling despite following these instructions. Call if you have concerns not answered by these instructions  ######################################################################    1. DIET: Follow a light bland diet & liquids the first 24 hours after arrival home, such as soup, liquids, starches, etc.  Be sure to drink plenty of fluids.  Quickly advance to a usual solid diet within a few days.  Avoid fast food or heavy meals as your are more likely to get nauseated or have irregular bowels.  A low-fat, high-fiber diet for the rest of your life is ideal.   2. Take your usually prescribed home  medications unless otherwise directed.  3. PAIN CONTROL: a. Pain is best controlled by a usual combination of three different methods TOGETHER: i. Ice/Heat ii. Over the counter pain medication iii. Prescription pain medication b. Most patients will experience some swelling and bruising around the hernia(s) such as the bellybutton, groins, or old incisions.  Ice packs or heating pads (30-60 minutes up to 6 times a  day) will help. Use ice for the first few days to help decrease swelling and bruising, then switch to heat to help relax tight/sore spots and speed recovery.  Some people prefer to use ice alone, heat alone, alternating between ice & heat.  Experiment to what works for you.  Swelling and bruising can take several weeks to resolve.   c. It is helpful to take an over-the-counter pain medication regularly for the first few weeks.  Choose one of the following that works best for you: i. Naproxen (Aleve, etc)  Two 220mg  tabs twice a day ii. Ibuprofen (Advil, etc) Three 200mg  tabs four times a day (every meal & bedtime) iii. Acetaminophen (Tylenol, etc) 325-650mg  four times a day (every meal & bedtime) d. A  prescription for pain medication should be given to you upon discharge.  Take your pain medication as prescribed.  i. If you are having problems/concerns with the prescription medicine (does not control pain, nausea, vomiting, rash, itching, etc), please call us [phone] to see if we need to switch you to a different pain medicine that will work better for you and/or control your side effect better. ii. If you need a refill on your pain medication, please contact your pharmacy.  They will contact our office to request authorization. Prescriptions will not be filled after 5 pm or on week-ends.  4. Avoid getting constipated.  Between the surgery and the pain medications, it is common to experience some constipation.  Increasing fluid intake and taking a fiber supplement (such as Metamucil, Citrucel, FiberCon, MiraLax, etc) 1-2 times a day regularly will usually help prevent this problem from occurring.  A mild laxative (prune juice, Milk of Magnesia, MiraLax, etc) should be taken according to package directions if there are no bowel movements after 48 hours.    5. Wash / shower every day.  You may shower over the dressings as they are waterproof.    6. Remove your waterproof bandages, skin tapes, and  other bandages 5 days after surgery. You may replace a dressing/Band-Aid to cover the incision for comfort if you wish. You may leave the incisions open to air.  You may replace a dressing/Band-Aid to cover an incision for comfort if you wish.  Continue to shower over incision(s) after the dressing is off.  7. ACTIVITIES as tolerated:   a. You may resume regular (light) daily activities beginning the next day--such as daily self-care, walking, climbing stairs--gradually increasing activities as tolerated.  Control your pain so that you can walk an hour a day.  If you can walk 30 minutes without difficulty, it is safe to try more intense activity such as jogging, treadmill, bicycling, low-impact aerobics, swimming, etc. b. Save the most intensive and strenuous activity for last such as sit-ups, heavy lifting, contact sports, etc  Refrain from any heavy lifting or straining until you are off narcotics for pain control.   c. DO NOT PUSH THROUGH PAIN.  Let pain be your guide: If it hurts to do something, don't do it.  Pain is your body warning you to avoid that activity for  another week until the pain goes down. d. You may drive when you are no longer taking prescription pain medication, you can comfortably wear a seatbelt, and you can safely maneuver your car and apply brakes. e. Dennis Bast may have sexual intercourse when it is comfortable.   8. FOLLOW UP in our office a. Please call [facility] at [phone] to set up an appointment to see your surgeon in the office for a follow-up appointment approximately 2-3 weeks after your surgery. b. Make sure that you call for this appointment the day you arrive home to insure a convenient appointment time.  9.  If you have disability of FMLA / Family leave forms, please bring the forms to the office for processing.  (do not give to your surgeon).  WHEN TO CALL us [phone]: 1. Poor pain control 2. Reactions / problems with new medications (rash/itching, nausea,  etc)  3. Fever over 101.5 F (38.5 C) 4. Inability to urinate 5. Nausea and/or vomiting 6. Worsening swelling or bruising 7. Continued bleeding from incision. 8. Increased pain, redness, or drainage from the incision   The clinic staff is available to answer your questions during regular business hours (8:30am-5pm).  Please don't hesitate to call and ask to speak to one of our nurses for clinical concerns.   If you have a medical emergency, go to the nearest emergency room or call 911.  A surgeon from [facility] is always on call at the hospitals in [facility], [place], [place], [place], [place]  [address] ?  [address] [address], [place], [place]   [address] MAIN: [phone] ? TOLL FREE: [phone] ? FAX: [phone] [email or web address]  Post Anesthesia Home Care Instructions  Activity: Get plenty of rest for the remainder of the day. A responsible adult should stay with you for 24 hours following the procedure.  For the next 24 hours, DO NOT: -Drive a car -Paediatric nurse -Drink alcoholic beverages -Take any medication unless instructed by your physician -Make any legal decisions or sign important papers.  Meals: Start with liquid foods such as gelatin or soup. Progress to regular foods as tolerated. Avoid greasy, spicy, heavy foods. If nausea and/or vomiting occur, drink only clear liquids until the nausea and/or vomiting subsides. Call your physician if vomiting continues.  Special Instructions/Symptoms: Your throat may feel dry or sore from the anesthesia or the breathing tube placed in your throat during surgery. If this causes discomfort, gargle with warm salt water. The discomfort should disappear within 24 hours.   Information for Discharge Teaching: EXPAREL (bupivacaine liposome injectable suspension)   Your surgeon or anesthesiologist gave you EXPAREL(bupivacaine) to help control your pain after  surgery.   EXPAREL is a local anesthetic that provides pain relief by numbing the tissue around the surgical site.  EXPAREL is designed to release pain medication over time and can control pain for up to 72 hours.  Depending on how you respond to EXPAREL, you may require less pain medication during your recovery.  Possible side effects:  Temporary loss of sensation or ability to move in the area where bupivacaine was injected.  Nausea, vomiting, constipation  Rarely, numbness and tingling in your mouth or lips, lightheadedness, or anxiety may occur.  Call your doctor right away if you think you may be experiencing any of these sensations, or if you have other questions regarding possible side effects.  Follow all other discharge instructions given to you by your surgeon or  nurse. Eat a healthy diet and drink plenty of water or other fluids.  If you return to the hospital for any reason within 96 hours following the administration of EXPAREL, it is important for health care providers to know that you have received this anesthetic. A teal colored band has been placed on your arm with the date, time and amount of EXPAREL you have received in order to alert and inform your health care providers. Please leave this armband in place for the full 96 hours following administration, and then you may remove the band.

## 2019-09-21 NOTE — Anesthesia Procedure Notes (Addendum)
Procedure Name: Intubation Date/Time: 09/21/2019 7:53 AM Performed by: Justice Rocher, CRNA Pre-anesthesia Checklist: Patient identified, Emergency Drugs available, Suction available and Patient being monitored Patient Re-evaluated:Patient Re-evaluated prior to induction Oxygen Delivery Method: Circle system utilized Preoxygenation: Pre-oxygenation with 100% oxygen Induction Type: IV induction and Cricoid Pressure applied Ventilation: Mask ventilation without difficulty Laryngoscope Size: Mac and 4 Grade View: Grade III Tube type: Oral Tube size: 8.0 mm Number of attempts: 3 Airway Equipment and Method: Stylet,  Oral airway,  Bougie stylet and Video-laryngoscopy Placement Confirmation: ETT inserted through vocal cords under direct vision,  positive ETCO2 and breath sounds checked- equal and bilateral Secured at: 24 cm Tube secured with: Tape Dental Injury: Teeth and Oropharynx as per pre-operative assessment  Difficulty Due To: Difficulty was unanticipated and Difficult Airway- due to anterior larynx Comments: Pt ambulated and positioned self with assistance on OR bed. SOB FM O2 applied . Ramped shoulders on grey wedge support with head foam pillow.  Pre O2 well. DL x1 ( unable to visualize ). Passed airway to Dr Glennon Mac MDA . DL x1 with CP Attempted Bouge insertion - unable. Mask ventilated easily with 9.0 O/A. Glidescope used with successful intubation DL x 1 ETT 8.0 BBS equal. Soft gauze wrapped around tongue blade used as O/ A Noted small lip tear - ointment applied.   *difficult intubation letter

## 2019-09-21 NOTE — Transfer of Care (Signed)
Immediate Anesthesia Transfer of Care Note  Patient: Andres Taylor  Procedure(s) Performed: Procedure(s) (LRB): LAPAROSCOPIC BILATERAL INGUINAL HERNIA REPAIR, LYSIS OF ADHESIONS (Bilateral)  Patient Location: PACU  Anesthesia Type: General  Level of Consciousness: awake, sedated, patient cooperative and responds to stimulation  Airway & Oxygen Therapy: Patient Spontanous Breathing and Patient connected to face mask oxygen  Post-op Assessment: Report given to PACU RN, Post -op Vital signs reviewed and stable and Patient moving all extremities  Post vital signs: Reviewed and stable  Complications: No apparent anesthesia complications

## 2019-10-11 ENCOUNTER — Telehealth: Payer: Self-pay | Admitting: Nurse Practitioner

## 2019-10-11 NOTE — Progress Notes (Signed)
°  Chronic Care Management   Outreach Note  10/11/2019 Name: Andres Taylor MRN: [id number] DOB: 03-07-1946  Referred by: [name], NP Reason for referral : No chief complaint on file.   An unsuccessful telephone outreach was attempted today. The patient was referred to the pharmacist for assistance with care management and care coordination.   Follow Up Plan:   [name] Upstream Scheduler

## 2019-10-12 ENCOUNTER — Telehealth: Payer: Self-pay | Admitting: Nurse Practitioner

## 2019-10-12 NOTE — Progress Notes (Signed)
  Chronic Care Management   Note  10/12/2019 Name: [name] MRN: [id number] DOB: [birth year]  Andres Taylor is a 74 y.o. year old male who is a primary care patient of Nche, Charlene Brooke, NP. I reached out to [facility] by phone today in response to a referral sent by [name] [facility] PCP, Nche, Charlene Brooke, NP.   [name] was given information about Chronic Care Management services today including:  1. CCM service includes personalized support from designated clinical staff supervised by his physician, including individualized plan of care and coordination with other care providers 2. 24/7 contact phone numbers for assistance for urgent and routine care needs. 3. Service will only be billed when office clinical staff spend 20 minutes or more in a month to coordinate care. 4. Only one practitioner may furnish and bill the service in a calendar month. 5. The patient may stop CCM services at any time (effective at the end of the month) by phone call to the office staff.   Patient agreed to services and verbal consent obtained.   Follow up plan:   [name] Upstream Scheduler

## 2019-10-16 ENCOUNTER — Ambulatory Visit: Payer: Medicare Other

## 2019-10-16 ENCOUNTER — Other Ambulatory Visit: Payer: Self-pay

## 2019-10-16 DIAGNOSIS — I208 Other forms of angina pectoris: Secondary | ICD-10-CM

## 2019-10-16 DIAGNOSIS — E782 Mixed hyperlipidemia: Secondary | ICD-10-CM

## 2019-10-16 DIAGNOSIS — E1142 Type 2 diabetes mellitus with diabetic polyneuropathy: Secondary | ICD-10-CM

## 2019-10-16 DIAGNOSIS — I272 Pulmonary hypertension, unspecified: Secondary | ICD-10-CM

## 2019-10-16 DIAGNOSIS — J4530 Mild persistent asthma, uncomplicated: Secondary | ICD-10-CM

## 2019-10-16 DIAGNOSIS — I10 Essential (primary) hypertension: Secondary | ICD-10-CM

## 2019-10-16 NOTE — Chronic Care Management (AMB) (Signed)
Chronic Care Management Pharmacy  Name: Andres Taylor  MRN: [id number] DOB: [birth year]  Chief Complaint/ HPI  Andres Taylor,  74 y.o. , male presents for their Initial CCM visit with the clinical pharmacist via telephone due to COVID-19 Pandemic.  PCP : [name], NP  Their chronic conditions include: hypertension, asthma, type 2 diabetes, hyperlipidemia, gout  Office Visits: 07/04/19: Patient TeleVisit with Wilfred Lacy for follow-up. Labs and BP stable, no medication changes made at that time. 06/02/19: Patient Video visit with Wilfred Lacy for follow-up. ACE inhibitor changed by pulmonolgist from lisinopril to olmesartan. BP noted to be elevated at 150/83. Patient told to monitor BP and report in a week. No changes made to medications.   Consult Visits:  09/21/19: Patient presented to [facility] for bilateral inguinal hernia repair  08/22/19: Patient presented to Dr. Devonne Doughty (Opthamology) for cataract evaluation. Patient noted to be presurgical, no medication changes noted.  07/03/19: Patient presented to Rexene Edison, NP (pulmonology) for follow-up. Patient noted to have worsening shortness of breath and daily cough, possibly related to ACE inhibitor. Pulmonary function testing showed moderate restriction and a positive bronchodilator response. Patient encouraged on daily Breo use and to switch ACE inhibitors. Patient given influenza vaccine.  06/22/19: Patient presented to Dr. Devonne Doughty (Opthamology) for cataract evaluation. Patient counseled on contact lens hygiene   SDOH Interventions     Most Recent Value  SDOH Interventions  SDOH Interventions for the Following Domains  Physical Activity  Physical Activity Interventions  Patient Refused     Medications: Outpatient Encounter Medications as of 10/16/2019  Medication Sig  . allopurinol (ZYLOPRIM) 100 MG tablet TAKE 1 TABLET BY MOUTH DAILY (Patient taking differently: every  morning. Take 1 tablet by mouth daily)  . aspirin EC 81 MG tablet Take 81 mg by mouth daily.  [name] atorvastatin (LIPITOR) 10 MG tablet Take 1 tablet (10 mg total) by mouth daily at 6 PM.  . BREO ELLIPTA 200-25 MCG/INH AEPB INHALE 1 PUFF INTO THE LUNGS DAILY  . cetirizine (ZYRTEC) 10 MG chewable tablet Chew 10 mg by mouth daily.  . Coenzyme Q10 (COQ10) 100 MG CAPS Take 100 mg by mouth 3 (three) times daily. (Patient taking differently: Take 100 mg by mouth in the morning and at bedtime. )  . colchicine 0.6 MG tablet Take 0.6 mg by mouth 2 (two) times daily as needed (for gout). PRN for Gout  . diltiazem (CARDIZEM CD) 180 MG 24 hr capsule TAKE 1 CAPSULE(180 MG) BY MOUTH DAILY  . gabapentin (NEURONTIN) 300 MG capsule Take 1 capsule (300 mg total) by mouth 2 (two) times daily. (Patient taking differently: Take 300 mg by mouth at bedtime. )  . guaiFENesin (MUCINEX) 600 MG 12 hr tablet Take 600 mg by mouth daily.  . hydrochlorothiazide (MICROZIDE) 12.5 MG capsule Take 1 capsule (12.5 mg total) by mouth daily as needed (For swelling). (Patient taking differently: Take 12.5 mg by mouth daily. )  . ipratropium (ATROVENT) 0.06 % nasal spray INSTILL 2 SPRAYS INTO THE NOSE FOUR TIMES DAILY (Patient taking differently: Place 2 sprays into both nostrils in the morning and at bedtime. )  . metFORMIN (GLUCOPHAGE) 850 MG tablet TAKE 1 TABLET(850 MG) BY MOUTH TWICE DAILY WITH A MEAL  . methocarbamol (ROBAXIN) 500 MG tablet Take 1 tablet (500 mg total) by mouth every 12 (twelve) hours as needed for muscle spasms.  . montelukast (SINGULAIR) 10 MG tablet Take 1 tablet (10 mg total) by  mouth at bedtime.  . Multiple Vitamin (MULTIVITAMIN) tablet Take 1 tablet by mouth daily.  [name] olmesartan (BENICAR) 20 MG tablet Take 1 tablet (20 mg total) by mouth daily.  [name] omega-3 acid ethyl esters (LOVAZA) 1 g capsule Take 2 capsules (2 g total) by mouth 2 (two) times daily.  [name] albuterol (PROVENTIL) (2.5 MG/3ML) 0.083% nebulizer solution  Take 3 mLs (2.5 mg total) by nebulization every 6 (six) hours as needed for wheezing or shortness of breath. (Patient not taking: Reported on 09/13/2019)  . fluticasone (FLONASE) 50 MCG/ACT nasal spray as needed.   [name] omeprazole (PRILOSEC) 40 MG capsule Take 1 capsule (40 mg total) by mouth daily. (Patient not taking: Reported on 10/16/2019)  . PROAIR RESPICLICK 123XX123 (90 Base) MCG/ACT AEPB INHALE 2 PUFFS EVERY 4-6 HOURS AS NEEDED FOR COUGH/ WHEEZE (Patient not taking: Reported on 10/16/2019)  . traMADol (ULTRAM) 50 MG tablet Take 1-2 tablets (50-100 mg total) by mouth every 6 (six) hours as needed for moderate pain or severe pain. (Patient not taking: Reported on 10/16/2019)   No facility-administered encounter medications on file as of 10/16/2019.    Current Diagnosis/Assessment:  Goals Addressed            This Visit's Progress   . Chronic Care Management       CARE PLAN ENTRY  Current Barriers:  . Chronic Disease Management support, education, and care coordination needs related to hypertension, asthma, type 2 diabetes, hyperlipidemia, gout  Clinical Goal(s): Over the next 180 days, patient will:  . Work with the care management team to address educational, disease management, and care coordination needs  . Begin or continue self health monitoring activities as directed today  . Call provider office for new or worsened signs and symptoms  . Call care management team with questions or concerns . Maintain blood pressure less than 140/80 . Maintain A1c less than 7%  . Maintain LDL (Bad cholesterol) less than 70    Interventions: . Evaluation of current treatment plans and patient's adherence to plan as established by provider . Assessed patient understanding of disease states . Assessed patient's education and care coordination needs . Provided disease specific education to patient   Telephone follow up appointment with pharmacy team member scheduled for: 04/15/20 at 2:00 PM         Asthma / Restrictive Lung Disease   Eosinophil count:   Lab Results  Component Value Date/Time   EOSPCT 0 12/03/2013 08:30 PM  %                               Eos (Absolute):  Lab Results  Component Value Date/Time   EOSABS 0.0 12/03/2013 08:30 PM    Tobacco Status:  Social History   Tobacco Use  Smoking Status Former Smoker  . Packs/day: 1.50  . Years: 42.00  . Pack years: 63.00  . Types: Cigarettes  . Quit date: 07/13/2002  . Years since quitting: 17.2  Smokeless Tobacco Former User    Patient has failed these meds in past: n/a Patient is currently controlled on the following medications:   Proventil 0.083% neb (does not use)  ProAir Respiclick 123XX123 mcg/act 2 puff q4-6 hr PRN   Breo Ellipta 200-25 mcg/inh 1 puff daily   Montelukast 10 mg nightly PRN     Cetirizine 10 mg chew daily   Flonase 50 mcg/act daily PRN (does not use)  Guaifenesin 600 mg dailly  Ipatropium 0.06% nasal spray (uses 1-2x daily)  Using maintenance inhaler regularly? Yes Frequency of rescue inhaler use:  infrequently  We discussed: Patient reports breathing significantly improved since he has started taking his Breo inhaler consistently every day. His coughing symptoms have significantly improved recently as well. His breathing does worsen somewhat due to allergies.   Plan  Recommend decreasing guaifenesin to PRN use and seeing if cough remains stable.  Continue all other medications. ,  Diabetes   Recent Relevant Labs: Lab Results  Component Value Date/Time   HGBA1C 6.6 (H) 08/15/2019 08:56 AM   HGBA1C 6.9 (H) 12/06/2018 11:01 AM   MICROALBUR <0.7 08/15/2019 08:56 AM   MICROALBUR 1.0 06/06/2018 11:14 AM     Checking BG: Never  Recent FBG Readings: n/a Recent pre-meal BG readings: n/a Recent 2hr PP BG readings:  n/a Recent HS BG readings: n/a  Patient has failed these meds in past: n/a Patient is currently controlled on the following medications:   Metformin 850 mg twice  daily  A1c goal <7%   Last diabetic Foot exam:  Lab Results  Component Value Date/Time   HMDIABEYEEXA No Retinopathy 06/23/2019 12:00 AM    Last diabetic Eye exam: No results found for: HMDIABFOOTEX   We discussed: diet and exercise extensively Patient avoids sweets. Primarily drinks water, Zero Gatorade. 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 Wife checks weekly.   Patient home BP readings are ranging: patient unsure   We discussed diet and exercise extensively. Patient denies symptoms of headache, dizziness, or unsteadiness. Patient reports his cough has completely resolved since switching from lisinopril to olmesartan.    Plan  Continue current medications     Hyperlipidemia   Lipid Panel     Component Value Date/Time   CHOL 106 08/15/2019 0856   TRIG 98.0 08/15/2019 0856   HDL 35.70 (L) 08/15/2019 0856   CHOLHDL 3 08/15/2019 0856   VLDL 19.6 08/15/2019 0856   LDLCALC 51 08/15/2019 0856   LDLDIRECT 132.0 09/06/2018 1059     The ASCVD Risk score (Goff DC Jr., et al., 2013) failed to calculate for the following reasons:   The valid total cholesterol range is 130 to 320 mg/dL   Patient has failed these meds in past: n/a Patient is currently controlled on the following medications:   Atorvastatin 10 mg daily   Lovaza 2g BID (started 09/06/18)   Aspirin 81 mg daily   We discussed:  diet and exercise extensively  Plan Given significant reduction in triglycerides, recommend decreasing  Lovaza to 2g daily or discontinuing completely to reduce cost/polypharmacy burden on patient.   Gout   Uric Acid 12/06/18 6.6   Patient has failed these meds in past: n/a Patient is currently controlled on the following medications:  Allopurinol 100 mg daily   Colchicine 0.6 mg BID PRN   We discussed: Patient has not had gout attack or symptoms of gout in many years despite slightly elevated Uric Acid  level.    Plan  Continue current medications   Chronic Pain   Patient has failed these meds in past: n/a Patient is currently managed on the following medications:   Gabapentin 300 mg BID   Methocarbamol 500 mg BID PRN- takes most days for back pain  We discussed: Patient mainly takes gabapentin PRN in the evenings to help manage his restless leg. Denies symptoms of CNS depression.   Plan  Continue current medications   Misc/OTC   Co Q10 100 mg TID - recommended by Dr. Glenetta Hew. Only takes BID  Multivitamin daily  Vaccines   Reviewed and discussed patient's vaccination history.    Immunization History  Administered Date(s) Administered  . Fluad Quad(high Dose 65+) 07/03/2019  . Influenza Whole 05/10/2007, 07/08/2010  . Influenza,inj,Quad PF,6+ Mos 04/17/2018  . Influenza-Unspecified 07/14/2011, 06/29/2013, 03/30/2014, 04/09/2015, 05/19/2016  . PFIZER SARS-COV-2 Vaccination 08/05/2019, 08/27/2019  . Pneumococcal Conjugate-13 01/05/2012, 06/29/2013, 03/13/2014  . Pneumococcal Polysaccharide-23 02/15/2013, 04/09/2015  . Td 02/15/2013  . Tdap 07/08/2010, 07/13/2013  . Zoster 07/21/2010, 02/15/2013    Medication Management   Pt uses [facility] for all medications. No >5 day gap noted on external fill history.   Follow up: 6 months  Doristine Section [phone]

## 2019-10-16 NOTE — Progress Notes (Signed)
Referral created for CCM/thx dmf

## 2019-10-16 NOTE — Patient Instructions (Addendum)
Visit Information  Goals Addressed            This Visit's Progress   . Chronic Care Management       CARE PLAN ENTRY  Current Barriers:  . Chronic Disease Management support, education, and care coordination needs related to hypertension, asthma, type 2 diabetes, hyperlipidemia, gout  Clinical Goal(s): Over the next 180 days, patient will:  . Work with the care management team to address educational, disease management, and care coordination needs  . Begin or continue self health monitoring activities as directed today  . Call provider office for new or worsened signs and symptoms  . Call care management team with questions or concerns . Maintain blood pressure less than 140/80 . Maintain A1c less than 7%  . Maintain LDL (Bad cholesterol) less than 70    Interventions: . Evaluation of current treatment plans and patient's adherence to plan as established by provider . Assessed patient understanding of disease states . Assessed patient's education and care coordination needs . Provided disease specific education to patient   Telephone follow up appointment with pharmacy team member scheduled for: 04/15/20 at 2:00 PM        Andres Taylor was given information about Chronic Care Management services today including:  1. CCM service includes personalized support from designated clinical staff supervised by his physician, including individualized plan of care and coordination with other care providers 2. 24/7 contact phone numbers for assistance for urgent and routine care needs. 3. Standard insurance, coinsurance, copays and deductibles apply for chronic care management only during months in which we provide at least 20 minutes of these services. Most insurances cover these services at 100%, however patients may be responsible for any copay, coinsurance and/or deductible if applicable. This service may help you avoid the need for more expensive face-to-face services. 4. Only one  practitioner may furnish and bill the service in a calendar month. 5. The patient may stop CCM services at any time (effective at the end of the month) by phone call to the office staff.  Patient agreed to services and verbal consent obtained.   The patient verbalized understanding of instructions provided today and agreed to receive a mailed copy of patient instruction and/or educational materials. Telephone follow up appointment with pharmacy team member scheduled for: 04/15/20  Doristine Section [id number] DASH Eating Plan DASH stands for "Dietary Approaches to Stop Hypertension." The DASH eating plan is a healthy eating plan that has been shown to reduce high blood pressure (hypertension). It may also reduce your risk for type 2 diabetes, heart disease, and stroke. The DASH eating plan may also help with weight loss. What are tips for following this plan?  General guidelines  Avoid eating more than 2,300 mg (milligrams) of salt (sodium) a day. If you have hypertension, you may need to reduce your sodium intake to 1,500 mg a day.  Limit alcohol intake to no more than 1 drink a day for nonpregnant women and 2 drinks a day for men. One drink equals 12 oz of beer, 5 oz of wine, or 1 oz of hard liquor.  Work with your health care provider to maintain a healthy body weight or to lose weight. Ask what an ideal weight is for you.  Get at least 30 minutes of exercise that causes your heart to beat faster (aerobic exercise) most days of the week. Activities may include walking, swimming, or biking.  Work with your health care provider or diet and nutrition specialist (dietitian)  to adjust your eating plan to your individual calorie needs. Reading food labels   Check food labels for the amount of sodium per serving. Choose foods with less than 5 percent of the Daily Value of sodium. Generally, foods with less than 300 mg of sodium per serving fit into this eating plan.  To find whole  grains, look for the word "whole" as the first word in the ingredient list. Shopping  Buy products labeled as "low-sodium" or "no salt added."  Buy fresh foods. Avoid canned foods and premade or frozen meals. Cooking  Avoid adding salt when cooking. Use salt-free seasonings or herbs instead of table salt or sea salt. Check with your health care provider or pharmacist before using salt substitutes.  Do not fry foods. Cook foods using healthy methods such as baking, boiling, grilling, and broiling instead.  Cook with heart-healthy oils, such as olive, canola, soybean, or sunflower oil. Meal planning  Eat a balanced diet that includes: ? 5 or more servings of fruits and vegetables each day. At each meal, try to fill half of your plate with fruits and vegetables. ? Up to 6-8 servings of whole grains each day. ? Less than 6 oz of lean meat, poultry, or fish each day. A 3-oz serving of meat is about the same size as a deck of cards. One egg equals 1 oz. ? 2 servings of low-fat dairy each day. ? A serving of nuts, seeds, or beans 5 times each week. ? Heart-healthy fats. Healthy fats called Omega-3 fatty acids are found in foods such as flaxseeds and coldwater fish, like sardines, salmon, and mackerel.  Limit how much you eat of the following: ? Canned or prepackaged foods. ? Food that is high in trans fat, such as fried foods. ? Food that is high in saturated fat, such as fatty meat. ? Sweets, desserts, sugary drinks, and other foods with added sugar. ? Full-fat dairy products.  Do not salt foods before eating.  Try to eat at least 2 vegetarian meals each week.  Eat more home-cooked food and less restaurant, buffet, and fast food.  When eating at a restaurant, ask that your food be prepared with less salt or no salt, if possible. What foods are recommended? The items listed may not be a complete list. Talk with your dietitian about what dietary choices are best for  you. Grains Whole-grain or whole-wheat bread. Whole-grain or whole-wheat pasta. Brown rice. [place]. Bulgur. Whole-grain and low-sodium cereals. Pita bread. Low-fat, low-sodium crackers. Whole-wheat flour tortillas. Vegetables Fresh or frozen vegetables (raw, steamed, roasted, or grilled). Low-sodium or reduced-sodium tomato and vegetable juice. Low-sodium or reduced-sodium tomato sauce and tomato paste. Low-sodium or reduced-sodium canned vegetables. Fruits All fresh, dried, or frozen fruit. Canned fruit in natural juice (without added sugar). Meat and other protein foods Skinless chicken or [place]. Ground chicken or [place]. Pork with fat trimmed off. Fish and seafood. Egg whites. Dried beans, peas, or lentils. Unsalted nuts, nut butters, and seeds. Unsalted canned beans. Lean cuts of beef with fat trimmed off. Low-sodium, lean deli meat. Dairy Low-fat (1%) or fat-free (skim) milk. Fat-free, low-fat, or reduced-fat cheeses. Nonfat, low-sodium ricotta or cottage cheese. Low-fat or nonfat yogurt. Low-fat, low-sodium cheese. Fats and oils Soft margarine without trans fats. Vegetable oil. Low-fat, reduced-fat, or light mayonnaise and salad dressings (reduced-sodium). Canola, safflower, olive, soybean, and sunflower oils. Avocado. Seasoning and other foods Herbs. Spices. Seasoning mixes without salt. Unsalted popcorn and pretzels. Fat-free sweets. What foods are not  recommended? The items listed may not be a complete list. Talk with your dietitian about what dietary choices are best for you. Grains Baked goods made with fat, such as croissants, muffins, or some breads. Dry pasta or rice meal packs. Vegetables Creamed or fried vegetables. Vegetables in a cheese sauce. Regular canned vegetables (not low-sodium or reduced-sodium). Regular canned tomato sauce and paste (not low-sodium or reduced-sodium). Regular tomato and vegetable juice (not low-sodium or reduced-sodium). Angie Fava.  Olives. Fruits Canned fruit in a light or heavy syrup. Fried fruit. Fruit in cream or butter sauce. Meat and other protein foods Fatty cuts of meat. Ribs. Fried meat. Berniece Salines. Sausage. Bologna and other processed lunch meats. Salami. Fatback. Hotdogs. Bratwurst. Salted nuts and seeds. Canned beans with added salt. Canned or smoked fish. Whole eggs or egg yolks. Chicken or [place] with skin. Dairy Whole or 2% milk, cream, and half-and-half. Whole or full-fat cream cheese. Whole-fat or sweetened yogurt. Full-fat cheese. Nondairy creamers. Whipped toppings. Processed cheese and cheese spreads. Fats and oils Butter. Stick margarine. Lard. Shortening. Ghee. Bacon fat. Tropical oils, such as coconut, palm kernel, or palm oil. Seasoning and other foods Salted popcorn and pretzels. Onion salt, garlic salt, seasoned salt, table salt, and sea salt. Worcestershire sauce. Tartar sauce. Barbecue sauce. Teriyaki sauce. Soy sauce, including reduced-sodium. Steak sauce. Canned and packaged gravies. Fish sauce. Oyster sauce. Cocktail sauce. Horseradish that you find on the shelf. Ketchup. Mustard. Meat flavorings and tenderizers. Bouillon cubes. Hot sauce and Tabasco sauce. Premade or packaged marinades. Premade or packaged taco seasonings. Relishes. Regular salad dressings. Where to find more information:  National Heart, Lung, and [place]: https://wilson-eaton.com/  American Heart Association: www.heart.org Summary  The DASH eating plan is a healthy eating plan that has been shown to reduce high blood pressure (hypertension). It may also reduce your risk for type 2 diabetes, heart disease, and stroke.  With the DASH eating plan, you should limit salt (sodium) intake to 2,300 mg a day. If you have hypertension, you may need to reduce your sodium intake to 1,500 mg a day.  When on the DASH eating plan, aim to eat more fresh fruits and vegetables, whole grains, lean proteins, low-fat dairy, and heart-healthy  fats.  Work with your health care provider or diet and nutrition specialist (dietitian) to adjust your eating plan to your individual calorie needs. This information is not intended to replace advice given to you by your health care provider. Make sure you discuss any questions you have with your health care provider. Document Revised: 06/11/2017 Document Reviewed: 06/22/2016 Elsevier Patient Education  2020 [employer].

## 2019-10-17 ENCOUNTER — Telehealth: Payer: Self-pay

## 2019-10-17 NOTE — Telephone Encounter (Signed)
-----   Message from [name], NP sent at 10/16/2019  6:50 PM EDT ----- Ok to decrease lovaza to 2g daily  Thank you ----- Message ----- From: Germaine Pomfret, [place]: 10/16/2019   2:55 PM EDT To: [name], NP  Baldo Ash,  Patient's most recent lipid panel showed significant reduction. What would you think about stopping or decreasing his Lovaza to once daily to reduce patient's cost/polypharmacy burden? I would be happy to communicate either change with the patient if you were in agreement.

## 2019-10-17 NOTE — Telephone Encounter (Signed)
Plan communicated with patient. Patient stated that he would prefer to decrease Lovaza to 1g twice daily rather than 2g daily. Explained to patient that would be acceptable. He verbalized his understanding and repeated the plan back correctly to me.  Doristine Section [phone]

## 2019-10-20 ENCOUNTER — Telehealth: Payer: Self-pay | Admitting: *Deleted

## 2019-10-20 ENCOUNTER — Telehealth (INDEPENDENT_AMBULATORY_CARE_PROVIDER_SITE_OTHER): Payer: Medicare Other | Admitting: Cardiology

## 2019-10-20 VITALS — BP 137/74 | HR 68 | Ht 67.0 in | Wt 235.0 lb

## 2019-10-20 DIAGNOSIS — I10 Essential (primary) hypertension: Secondary | ICD-10-CM | POA: Diagnosis not present

## 2019-10-20 DIAGNOSIS — R0600 Dyspnea, unspecified: Secondary | ICD-10-CM | POA: Diagnosis not present

## 2019-10-20 DIAGNOSIS — R0609 Other forms of dyspnea: Secondary | ICD-10-CM

## 2019-10-20 DIAGNOSIS — G4730 Sleep apnea, unspecified: Secondary | ICD-10-CM

## 2019-10-20 DIAGNOSIS — Z902 Acquired absence of lung [part of]: Secondary | ICD-10-CM

## 2019-10-20 DIAGNOSIS — E119 Type 2 diabetes mellitus without complications: Secondary | ICD-10-CM

## 2019-10-20 DIAGNOSIS — Z9889 Other specified postprocedural states: Secondary | ICD-10-CM | POA: Diagnosis not present

## 2019-10-20 DIAGNOSIS — E1142 Type 2 diabetes mellitus with diabetic polyneuropathy: Secondary | ICD-10-CM

## 2019-10-20 DIAGNOSIS — J984 Other disorders of lung: Secondary | ICD-10-CM

## 2019-10-20 NOTE — Progress Notes (Signed)
Virtual Visit via Telephone Note   This visit type was conducted due to national recommendations for restrictions regarding the COVID-19 Pandemic (e.g. social distancing) in an effort to limit this patient's exposure and mitigate transmission in our community.  Due to his co-morbid illnesses, this patient is at least at moderate risk for complications without adequate follow up.  This format is felt to be most appropriate for this patient at this time.  The patient did not have access to video technology/had technical difficulties with video requiring transitioning to audio format only (telephone).  All issues noted in this document were discussed and addressed.  No physical exam could be performed with this format.  Please refer to the patient's chart for his  consent to telehealth for [facility].   The patient was identified using 2 identifiers.  Date:  10/20/2019   ID:  Andres, Taylor 1946/02/24, MRN [id number]  Patient Location: Home Provider Location: Home  PCP:  [name], NP  Cardiologist:  Glenetta Hew, MD  Electrophysiologist:  None   Evaluation Performed:  Follow-Up Visit  Chief Complaint:  No new complaints  History of Present Illness:    Andres Taylor is a 74 y.o. male with history of hypertension, COPD, dyslipidemia, and non-insulin-dependent diabetes.  He has a history of dyspnea on exertion.  Catheterization done in 2014 showed normal coronaries. He does have a history of restrictive lung disease and is followed by [facility].  He was evaluated by cardiology in December 2020 for preop clearance prior to an inguinal hernia repair.  Myoview in December 2020 was low risk.  Previous echocardiogram in October 2020 showed an ejection fraction of 60 to 65%.  The patient underwent laparoscopic hernia repair in March 2021.  Apparently the surgery was a little more extensive than expected.  The patient had a ruptured appendix in his 34s and had 2  operations then.  There was scar tissue when he went for his hernia surgery that had to be repaired.  He is recovering but he admits its not going quite as quickly as he thought it would.  He continues to have dyspnea on exertion but this is unchanged.  The patient does not have symptoms concerning for COVID-19 infection (fever, chills, cough, or new shortness of breath).    Past Medical History:  Diagnosis Date  . Allergic rhinitis   . Allergy   . Arthritis   . Asthma   . Blood in urine   . Cataract    forming   . Chronic cough   . CKD (chronic kidney disease), stage II   . DDD (degenerative disc disease)   . Diabetes mellitus ([place])   . DOE (dyspnea on exertion)   . Eustachian tube dysfunction   . Fatty liver   . GERD (gastroesophageal reflux disease)   . Glaucoma   . Gout   . Hard of hearing    bilateral   . History of cardiomegaly 05/16/2019   Noted on CXR  . History of kidney stones   . Hyperlipidemia   . Hypertension   . Inguinal hernia    Bilateral  . Joint pain, knee   . Leg edema   . Lung tumor (benign), right    Right upper lobe lung mass  . Obesity   . OSA on CPAP   . Pneumonitis    right lung  . Restrictive lung disease   . Sciatica   . Spinal stenosis   . Wears  contact lenses   . Wears glasses   . Wears hearing aid in both ears    Past Surgical History:  Procedure Laterality Date  . APPENDECTOMY  1976  . COLONOSCOPY  08/2010  . COLONOSCOPY  08/07/2019  . [place]  2001  . [place]   as a baby 80 months old  . INGUINAL HERNIA REPAIR Bilateral 09/21/2019   Procedure: LAPAROSCOPIC BILATERAL INGUINAL HERNIA REPAIR, LYSIS OF ADHESIONS;  Surgeon: Michael Boston, MD;  Location: [facility];  Service: General;  Laterality: Bilateral;  . LEFT AND RIGHT HEART CATHETERIZATION WITH CORONARY ANGIOGRAM N/A 04/20/2013   Procedure: LEFT AND RIGHT HEART CATHETERIZATION WITH CORONARY ANGIOGRAM;  Surgeon: Leonie Man, MD;  [facility];  Angiographically normal Coronary Arteries - L Dom Cx (OM1, small LPL1 then LPL2 & PDA) & wraparound LAD (D1-3 small).  Borderline CO/CI - 4.94/2.16 by both Fick & TD.  RAP ~14 mmHg, RVEDP 15 mmHg.  LVEDP 19 mmHg (->diastolic dysfunction) PCWP 16 mmHg, but PA Mean 25 mmHg  . LITHOTRIPSY     x 2  . LOBECTOMY Right   . lumbar disectomy L4-5  1980  . NM MYOVIEW LTD  06/15/2019    EF 70%.  No ischemia or infarction.  LOW RISK   . TRANSTHORACIC ECHOCARDIOGRAM  04/21/2019   EF 60 to 65%.  Mild LVH.  Normal RV.  Normal atrial size.  Mild aortic sclerosis.  No stenosis. Lipomatous intra-atrial septum  . UPPER GI ENDOSCOPY  08/07/2019  . VIDEO ASSISTED THORACOSCOPY (VATS)/WEDGE RESECTION Right 08/30/2013   Procedure: VIDEO ASSISTED THORACOSCOPY (VATS)/WEDGE RESECTION;  Surgeon: Melrose Nakayama, MD;  Location: [facility];  Service: Thoracic;  Laterality: Right;     Current Meds  Medication Sig  . allopurinol (ZYLOPRIM) 100 MG tablet TAKE 1 TABLET BY MOUTH DAILY (Patient taking differently: every morning. Take 1 tablet by mouth daily)  . aspirin EC 81 MG tablet Take 81 mg by mouth daily.  [name] atorvastatin (LIPITOR) 10 MG tablet Take 1 tablet (10 mg total) by mouth daily at 6 PM.  . BREO ELLIPTA 200-25 MCG/INH AEPB INHALE 1 PUFF INTO THE LUNGS DAILY  . cetirizine (ZYRTEC) 10 MG chewable tablet Chew 10 mg by mouth daily.  . Coenzyme Q10 (COQ10) 100 MG CAPS Take 100 mg by mouth 3 (three) times daily. (Patient taking differently: Take 100 mg by mouth in the morning and at bedtime. )  . colchicine 0.6 MG tablet Take 0.6 mg by mouth 2 (two) times daily as needed (for gout). PRN for Gout  . diltiazem (CARDIZEM CD) 180 MG 24 hr capsule TAKE 1 CAPSULE(180 MG) BY MOUTH DAILY  . gabapentin (NEURONTIN) 300 MG capsule Take 1 capsule (300 mg total) by mouth 2 (two) times daily. (Patient taking differently: Take 300 mg by mouth at bedtime. )  . guaiFENesin (MUCINEX) 600 MG 12 hr  tablet Take 600 mg by mouth daily.  . hydrochlorothiazide (MICROZIDE) 12.5 MG capsule Take 1 capsule (12.5 mg total) by mouth daily as needed (For swelling). (Patient taking differently: Take 12.5 mg by mouth daily. )  . ipratropium (ATROVENT) 0.06 % nasal spray INSTILL 2 SPRAYS INTO THE NOSE FOUR TIMES DAILY (Patient taking differently: Place 2 sprays into both nostrils in the morning and at bedtime. )  . metFORMIN (GLUCOPHAGE) 850 MG tablet TAKE 1 TABLET(850 MG) BY MOUTH TWICE DAILY WITH A MEAL  . methocarbamol (ROBAXIN) 500 MG tablet Take 1 tablet (500  mg total) by mouth every 12 (twelve) hours as needed for muscle spasms.  . montelukast (SINGULAIR) 10 MG tablet Take 1 tablet (10 mg total) by mouth at bedtime.  . Multiple Vitamin (MULTIVITAMIN) tablet Take 1 tablet by mouth daily.  [name] olmesartan (BENICAR) 20 MG tablet Take 1 tablet (20 mg total) by mouth daily.  [name] omega-3 acid ethyl esters (LOVAZA) 1 g capsule Take 2 capsules (2 g total) by mouth 2 (two) times daily. (Patient taking differently: Take 1 capsule by mouth 2 (two) times daily. )  . omeprazole (PRILOSEC) 40 MG capsule Take 1 capsule (40 mg total) by mouth daily.  [name] PROAIR RESPICLICK [id number] (90 Base) MCG/ACT AEPB INHALE 2 PUFFS EVERY 4-6 HOURS AS NEEDED FOR COUGH/ WHEEZE     Allergies:   Patient has no allergy information on record.   Social History   Tobacco Use  . Smoking status: Former Smoker    Packs/day: 1.50    Years: 42.00    Pack years: 63.00    Types: Cigarettes    Quit date: 07/13/2002    Years since quitting: 17.2  . Smokeless tobacco: Former Network engineer Use Topics  . Alcohol use: Yes    Alcohol/week: 3.0 standard drinks    Types: 3 Cans of beer per week    Comment: 3 tmes weekly  . Drug use: No     Family Hx: [name] family history includes AAA (abdominal aortic aneurysm) in his son; Breast cancer in his maternal grandmother; Cancer in his maternal grandmother and son; Heart failure in his son;  Hypertension in his maternal grandfather; Leukemia in his father; Stroke in his maternal grandfather, maternal grandmother, mother, and paternal grandmother. There is no history of Colon cancer, Esophageal cancer, Colon polyps, Rectal cancer, or Stomach cancer.  ROS:   Please see the history of present illness.    All other systems reviewed and are negative.   Prior CV studies:   The following studies were reviewed today: Myoview dec 2020 Echo Oct 2020  Labs/Other Tests and Data Reviewed:    EKG:  An ECG dated 06/19/2019 was personally reviewed today and demonstrated:  NSR, HR 64 RAD  Recent Labs: 05/16/2019: Platelets 225 08/15/2019: ALT 21 09/21/2019: BUN 21; Creatinine, Ser 1.20; Hemoglobin 13.3; Potassium 3.8; Sodium 138   Recent Lipid Panel Lab Results  Component Value Date/Time   CHOL 106 08/15/2019 08:56 AM   TRIG 98.0 08/15/2019 08:56 AM   HDL 35.70 (L) 08/15/2019 08:56 AM   CHOLHDL 3 08/15/2019 08:56 AM   LDLCALC 51 08/15/2019 08:56 AM   LDLDIRECT 132.0 09/06/2018 10:59 AM    Wt Readings from Last 3 Encounters:  10/20/19 235 lb (106.6 kg)  09/21/19 240 lb (108.9 kg)  08/07/19 243 lb (110.2 kg)     Objective:    Vital Signs:  BP 137/74   Pulse 68   Ht 5\' 7"  (1.702 m)   Wt 235 lb (106.6 kg)   SpO2 98%   BMI 36.81 kg/m    VITAL SIGNS:  reviewed  ASSESSMENT & PLAN:    DOE- Chronic problem.  Restrictive lung disease- Followed by [facility]  S/P RUL lung mass resection- 2015  Mild sleep apnea- Home sleep study Dec 2020  Normal coronaries- Normal cors in 2014, low risk Myoview Dec 2020  HTN- Controlled  NIDDM- On Glucophage  S/P inguinal hernia repair- 09/21/2019  Plan:  Same Rx.  F/U Dr Ellyn Hack in 6 months.   COVID-19 Education: The signs  and symptoms of COVID-19 were discussed with the patient and how to seek care for testing (follow up with PCP or arrange E-visit).  The importance of social distancing was discussed today.  Time:    Today, I have spent 15 minutes with the patient with telehealth technology discussing the above problems.     Medication Adjustments/Labs and Tests Ordered: Current medicines are reviewed at length with the patient today.  Concerns regarding medicines are outlined above.   Tests Ordered: No orders of the defined types were placed in this encounter.   Medication Changes: No orders of the defined types were placed in this encounter.   Follow Up:  In Person Dr Ellyn Hack in 6 months  Signed, [name], Hershal Coria  10/20/2019 9:33 AM    [place]

## 2019-10-20 NOTE — Patient Instructions (Signed)
Medication Instructions:  Your physician recommends that you continue on your current medications as directed. Please refer to the Current Medication list given to you today.  *If you need a refill on your cardiac medications before your next appointment, please call your pharmacy*   Lab Work: None at this time If you have labs (blood work) drawn today and your tests are completely normal, you will receive your results only by: [name] MyChart Message (if you have MyChart) OR . A paper copy in the mail If you have any lab test that is abnormal or we need to change your treatment, we will call you to review the results.   Testing/Procedures: None at this time   Follow-Up: At [facility], you and your health needs are our priority.  As part of our continuing mission to provide you with exceptional heart care, we have created designated Provider Care Teams.  These Care Teams include your primary Cardiologist (physician) and Advanced Practice Providers (APPs -  Physician Assistants and Nurse Practitioners) who all work together to provide you with the care you need, when you need it.  We recommend signing up for the patient portal called "MyChart".  Sign up information is provided on this After Visit Summary.  MyChart is used to connect with patients for Virtual Visits (Telemedicine).  Patients are able to view lab/test results, encounter notes, upcoming appointments, etc.  Non-urgent messages can be sent to your provider as well.   To learn more about what you can do with MyChart, go to [email or web address].    Your next appointment:   6 month(s)  The format for your next appointment:   In Person  Provider:   Glenetta Hew, MD   Other Instructions

## 2019-10-20 NOTE — Telephone Encounter (Signed)
  Patient Consent for Virtual Visit         Andres Taylor has provided verbal consent on 10/20/2019 for a virtual visit (video or telephone).   CONSENT FOR VIRTUAL VISIT FOR:  Andres Taylor  By participating in this virtual visit I agree to the following:  I hereby voluntarily request, consent and authorize [facility] and its employed or contracted physicians, physician assistants, nurse practitioners or other licensed health care professionals (the Practitioner), to provide me with telemedicine health care services (the "Services") as deemed necessary by the treating Practitioner. I acknowledge and consent to receive the Services by the Practitioner via telemedicine. I understand that the telemedicine visit will involve communicating with the Practitioner through live audiovisual communication technology and the disclosure of certain medical information by electronic transmission. I acknowledge that I have been given the opportunity to request an in-person assessment or other available alternative prior to the telemedicine visit and am voluntarily participating in the telemedicine visit.  I understand that I have the right to withhold or withdraw my consent to the use of telemedicine in the course of my care at any time, without affecting my right to future care or treatment, and that the Practitioner or I may terminate the telemedicine visit at any time. I understand that I have the right to inspect all information obtained and/or recorded in the course of the telemedicine visit and may receive copies of available information for a reasonable fee.  I understand that some of the potential risks of receiving the Services via telemedicine include:  [name] Delay or interruption in medical evaluation due to technological equipment failure or disruption; . Information transmitted may not be sufficient (e.g. poor resolution of images) to allow for appropriate medical decision making by the  Practitioner; and/or  . In rare instances, security protocols could fail, causing a breach of personal health information.  Furthermore, I acknowledge that it is my responsibility to provide information about my medical history, conditions and care that is complete and accurate to the best of my ability. I acknowledge that Practitioner's advice, recommendations, and/or decision may be based on factors not within their control, such as incomplete or inaccurate data provided by me or distortions of diagnostic images or specimens that may result from electronic transmissions. I understand that the practice of medicine is not an exact science and that Practitioner makes no warranties or guarantees regarding treatment outcomes. I acknowledge that a copy of this consent can be made available to me via my patient portal ([facility]), or I can request a printed copy by calling the office of [facility].    I understand that my insurance will be billed for this visit.   I have read or had this consent read to me. . I understand the contents of this consent, which adequately explains the benefits and risks of the Services being provided via telemedicine.  . I have been provided ample opportunity to ask questions regarding this consent and the Services and have had my questions answered to my satisfaction. . I give my informed consent for the services to be provided through the use of telemedicine in my medical care

## 2019-10-24 ENCOUNTER — Other Ambulatory Visit: Payer: Self-pay

## 2019-10-24 DIAGNOSIS — E1142 Type 2 diabetes mellitus with diabetic polyneuropathy: Secondary | ICD-10-CM

## 2019-10-24 MED ORDER — METFORMIN HCL 850 MG PO TABS: ORAL_TABLET | ORAL | 0 refills | Status: DC

## 2019-11-05 ENCOUNTER — Other Ambulatory Visit: Payer: Self-pay | Admitting: Nurse Practitioner

## 2019-11-05 DIAGNOSIS — M1A9XX0 Chronic gout, unspecified, without tophus (tophi): Secondary | ICD-10-CM

## 2019-11-05 DIAGNOSIS — R053 Chronic cough: Secondary | ICD-10-CM

## 2019-11-05 DIAGNOSIS — R05 Cough: Secondary | ICD-10-CM

## 2019-11-06 ENCOUNTER — Other Ambulatory Visit: Payer: Self-pay | Admitting: Nurse Practitioner

## 2019-11-06 ENCOUNTER — Telehealth: Payer: Self-pay | Admitting: Nurse Practitioner

## 2019-11-06 DIAGNOSIS — I10 Essential (primary) hypertension: Secondary | ICD-10-CM

## 2019-11-06 MED ORDER — HYDROCHLOROTHIAZIDE 12.5 MG PO CAPS: 12.5000 mg | ORAL_CAPSULE | Freq: Every day | ORAL | 1 refills | Status: DC | PRN

## 2019-11-06 NOTE — Telephone Encounter (Signed)
rx sent

## 2019-11-06 NOTE — Telephone Encounter (Signed)
Ok to refill. Due to f/up with me in 56months

## 2019-11-06 NOTE — Telephone Encounter (Signed)
[facility] requesting refill for HCTZ 12.5 mg. Dr. Ellyn Hack sent in 60 tab in 06/23/19 for edema PRN and follow with Heart care 4 mo.   Charl[place] please advise, ok to refills

## 2019-11-10 ENCOUNTER — Telehealth: Payer: Self-pay | Admitting: Nurse Practitioner

## 2019-11-10 DIAGNOSIS — M1A9XX0 Chronic gout, unspecified, without tophus (tophi): Secondary | ICD-10-CM

## 2019-11-10 MED ORDER — COLCHICINE 0.6 MG PO TABS: 0.6000 mg | ORAL_TABLET | Freq: Two times a day (BID) | ORAL | 1 refills | Status: DC | PRN

## 2019-11-10 NOTE — Telephone Encounter (Signed)
Patient is requesting refill of Colchicine 0.6 mg tablets sent to same [facility] they use.

## 2019-11-11 ENCOUNTER — Other Ambulatory Visit: Payer: Self-pay | Admitting: Nurse Practitioner

## 2019-11-13 NOTE — Telephone Encounter (Signed)
Pt notified of refill

## 2019-11-14 ENCOUNTER — Ambulatory Visit: Payer: Self-pay

## 2019-11-14 DIAGNOSIS — I10 Essential (primary) hypertension: Secondary | ICD-10-CM

## 2019-11-14 DIAGNOSIS — J4530 Mild persistent asthma, uncomplicated: Secondary | ICD-10-CM

## 2019-11-14 NOTE — Chronic Care Management (AMB) (Signed)
Asthma assessment was performed for Andres Taylor, 10-21-1945 on 11/14/2019 by Cristie Hem. [name] FEV1 was 60% on 03/13/19. Over the past 6 months, the following interventions have been made to improve breathing: 10/20/19: Patient presented to [name], PA-C (Cardiology) for dyspnea on exertion. Hernia repair in March 2021, patient still recovering. No medication changes made. 10/16/19: Pharmacist CCM Visit. The patient was last seen by pulmonology on 07/03/19. The patient has not had an ED visit since their last CPP follow up. [name] current asthma meds are: [name] 200-25 mcg/inh 1 puff daily (11/09/19- [id number]), ProAir Respiclick ([id number]). [name] current Adherence Review Medications are: atorvastatin 10 (11/05/19 85 DS), Olmesartan 20 (08/26/19 90DS), Metformin 850 (10/24/19 90DS). They currently do not have a greater than 5 day gap between last fill. Patient's Albuterol inhaler use: Has not needed to use rescue inhaler at all in past month. The patient does not forget to use their maintenance inhaler daily. He reports increased symptoms in allergies due to pollen in the air, but no worsening shortness of breath.  Patient's CAT Score Response:  How often are you coughing?: Scale 2  How much mucus are you feeling in your chest?: Scale 2 How much chest tightness are you having?: Scale 0  How is your breath when you walk up a hill or flight of stairs?: Scale 2 How are your limitations to doing activities at home?: Scale 0 How would you rate your confidence in leaving your home due to your lung condition?: Scale 0  How are you sleeping?: Scale 0  How is your energy level?: Scale 0.  Their current CAT score is 6.  Doristine Section Clinical Pharmacist  Claudie Fisherman  [phone]

## 2019-11-15 ENCOUNTER — Other Ambulatory Visit: Payer: Self-pay | Admitting: Pulmonary Disease

## 2019-11-20 ENCOUNTER — Other Ambulatory Visit: Payer: Self-pay | Admitting: Nurse Practitioner

## 2019-11-20 DIAGNOSIS — I10 Essential (primary) hypertension: Secondary | ICD-10-CM

## 2019-11-24 ENCOUNTER — Telehealth: Payer: Self-pay

## 2019-11-24 NOTE — Telephone Encounter (Signed)
Charlotte please advise.  Pt requesting?  Methocarbamol 500mg   Last fill-06/12/19 Last office visit-07/04/19  Pt has upcoming following up scheduled for 01/02/20 for DM, HTN, and hyperlipidemia.

## 2019-11-26 ENCOUNTER — Other Ambulatory Visit: Payer: Self-pay | Admitting: Nurse Practitioner

## 2019-11-27 MED ORDER — METHOCARBAMOL 500 MG PO TABS: 500.0000 mg | ORAL_TABLET | Freq: Two times a day (BID) | ORAL | 0 refills | Status: DC | PRN

## 2019-11-27 NOTE — Telephone Encounter (Signed)
Ok to refill 

## 2019-11-27 NOTE — Addendum Note (Signed)
Addended by: Lucila Maine on: 11/27/2019 02:52 PM   Modules accepted: Orders

## 2019-11-27 NOTE — Telephone Encounter (Signed)
Rx sent an patient notified.

## 2019-11-30 IMAGING — DX CHEST - 2 VIEW
2 series · 2 of 2 positions shown · non-contrast
Comparison: Chest radiograph 05/09/2018

CLINICAL DATA: Shortness of breath.

EXAM:
CHEST - 2 VIEW

[chest pa]
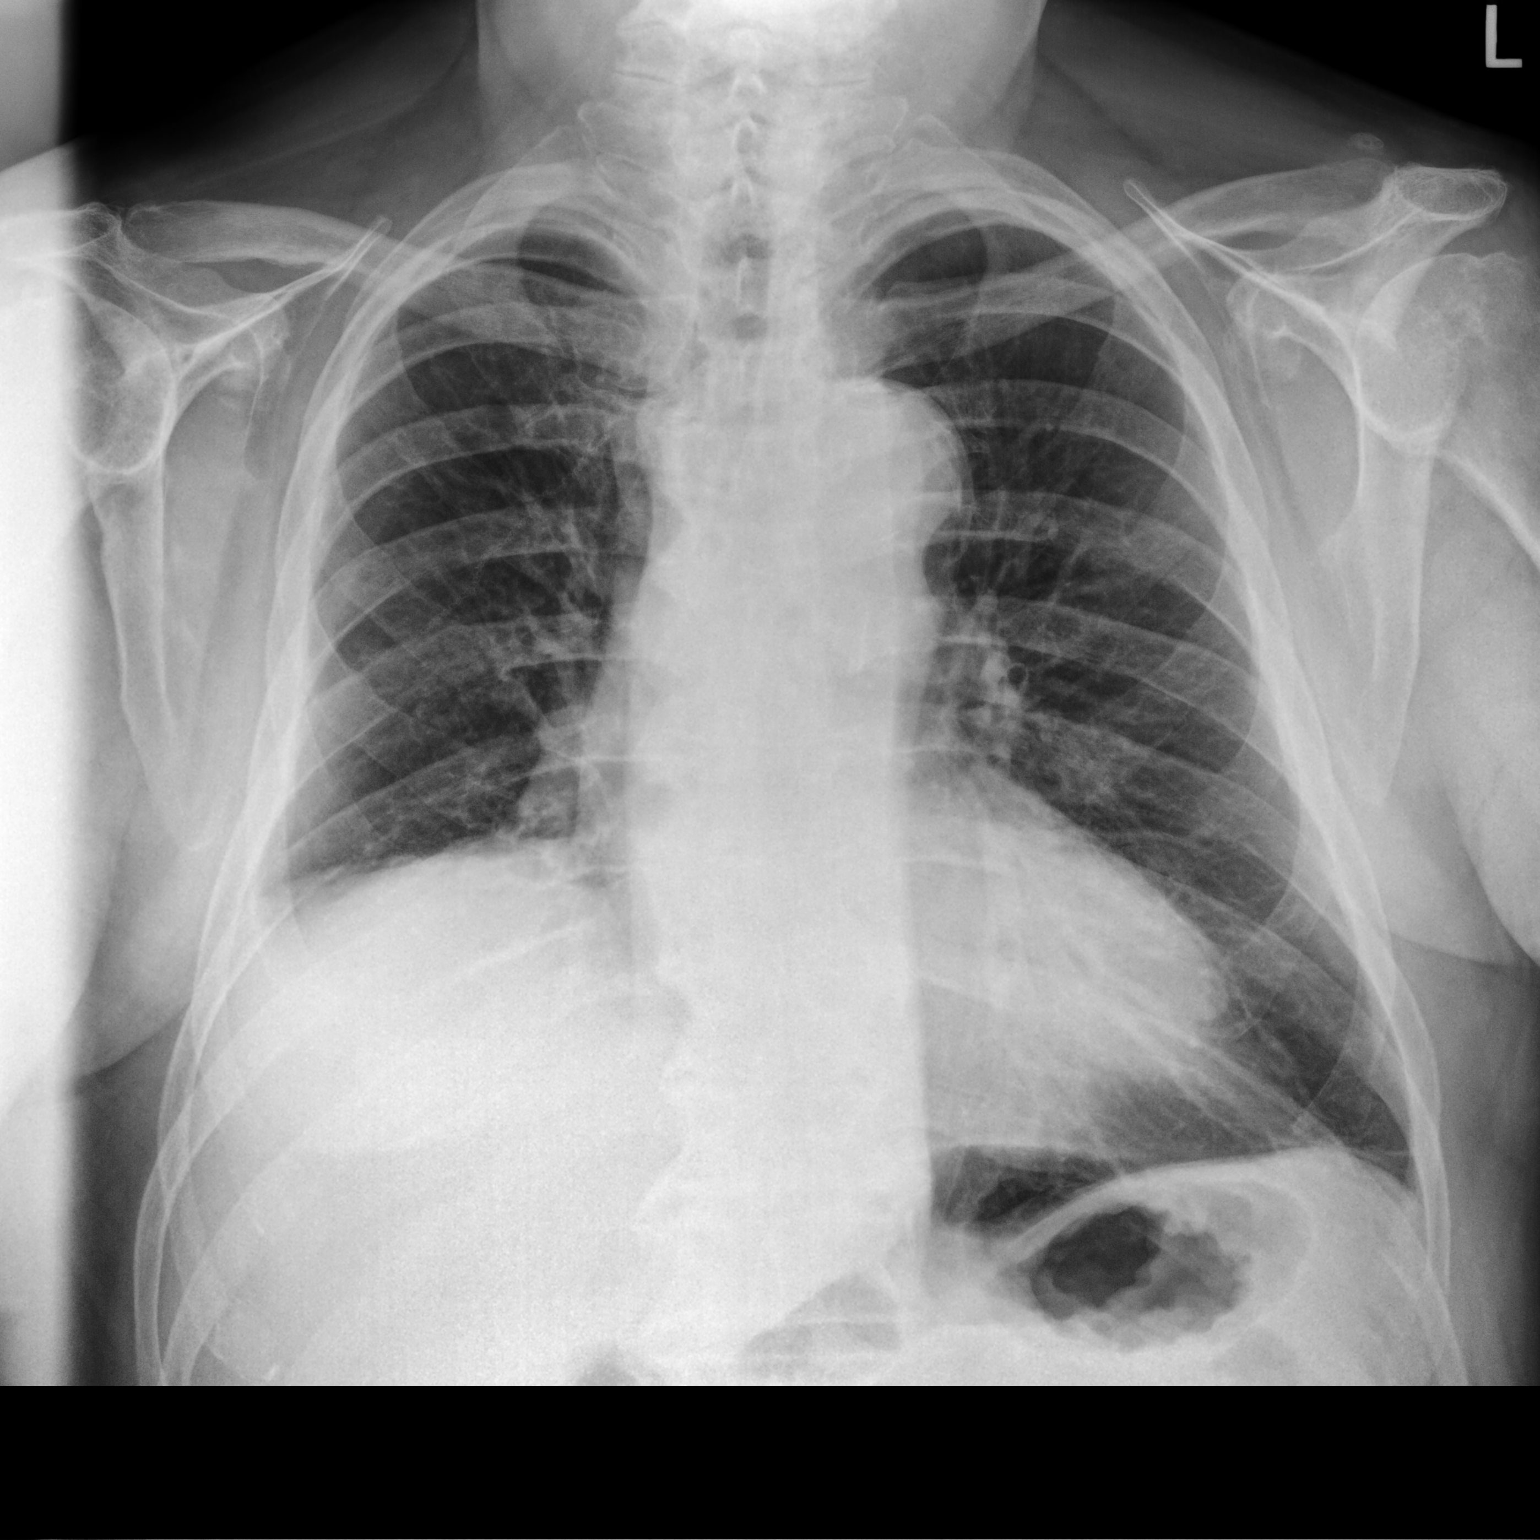

[chest lat]
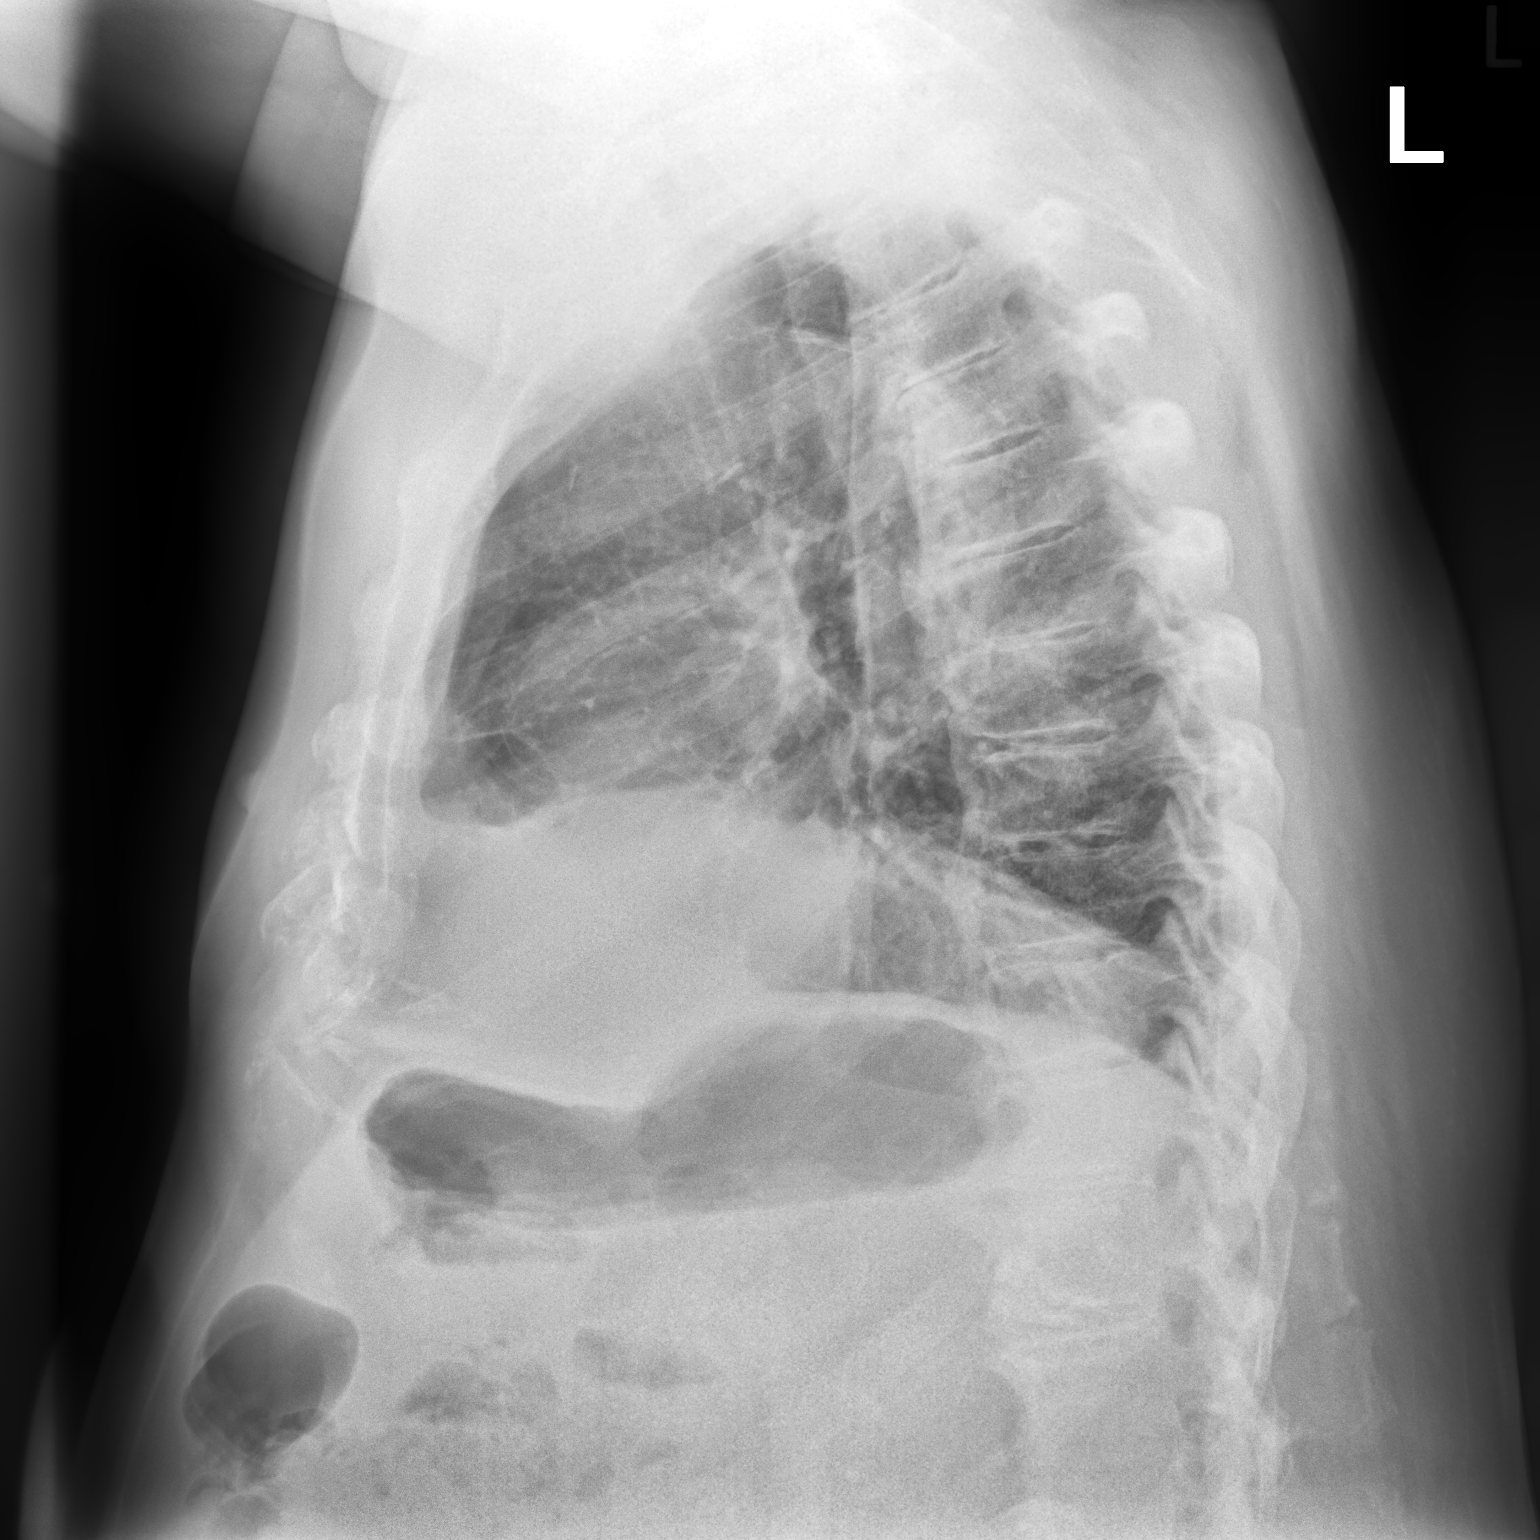

[2 of 2 positions shown; findings below may reference images not displayed]

FINDINGS: Stable cardiac and mediastinal contours. Elevation of the right
hemidiaphragm. No consolidative pulmonary opacities. No pleural
effusion or pneumothorax. Thoracic spine degenerative changes.
IMPRESSION: No acute cardiopulmonary process.

## 2019-12-05 ENCOUNTER — Other Ambulatory Visit: Payer: Self-pay | Admitting: Nurse Practitioner

## 2019-12-05 DIAGNOSIS — E782 Mixed hyperlipidemia: Secondary | ICD-10-CM

## 2019-12-10 ENCOUNTER — Other Ambulatory Visit: Payer: Self-pay | Admitting: Nurse Practitioner

## 2019-12-18 ENCOUNTER — Other Ambulatory Visit: Payer: Self-pay | Admitting: Nurse Practitioner

## 2019-12-18 DIAGNOSIS — I10 Essential (primary) hypertension: Secondary | ICD-10-CM

## 2020-01-01 ENCOUNTER — Other Ambulatory Visit: Payer: Self-pay

## 2020-01-02 ENCOUNTER — Encounter: Payer: Self-pay | Admitting: Nurse Practitioner

## 2020-01-02 ENCOUNTER — Ambulatory Visit (INDEPENDENT_AMBULATORY_CARE_PROVIDER_SITE_OTHER): Payer: Medicare Other | Admitting: Nurse Practitioner

## 2020-01-02 VITALS — BP 132/80 | HR 66 | Temp 97.1°F | Ht 67.0 in | Wt 243.6 lb

## 2020-01-02 DIAGNOSIS — I10 Essential (primary) hypertension: Secondary | ICD-10-CM

## 2020-01-02 DIAGNOSIS — E782 Mixed hyperlipidemia: Secondary | ICD-10-CM | POA: Diagnosis not present

## 2020-01-02 DIAGNOSIS — N182 Chronic kidney disease, stage 2 (mild): Secondary | ICD-10-CM

## 2020-01-02 DIAGNOSIS — M5416 Radiculopathy, lumbar region: Secondary | ICD-10-CM

## 2020-01-02 DIAGNOSIS — E1142 Type 2 diabetes mellitus with diabetic polyneuropathy: Secondary | ICD-10-CM | POA: Diagnosis not present

## 2020-01-02 DIAGNOSIS — M1A9XX0 Chronic gout, unspecified, without tophus (tophi): Secondary | ICD-10-CM | POA: Diagnosis not present

## 2020-01-02 LAB — LIPID PANEL
Cholesterol: 118 mg/dL (ref 0–200)
HDL: 34.9 mg/dL — ABNORMAL LOW (ref >39.00)
LDL Cholesterol: 55 mg/dL (ref 0–99)
NonHDL: 82.8
Total CHOL/HDL Ratio: 3
Triglycerides: 137 mg/dL (ref 0.0–149.0)
VLDL: 27.4 mg/dL (ref 0.0–40.0)

## 2020-01-02 LAB — HEMOGLOBIN A1C: Hgb A1c MFr Bld: 6.3 % (ref 4.6–6.5)

## 2020-01-02 LAB — URIC ACID: Uric Acid, Serum: 5.8 mg/dL (ref 4.0–7.8)

## 2020-01-02 LAB — BASIC METABOLIC PANEL
BUN: 14 mg/dL (ref 6–23)
CO2: 31 mEq/L (ref 19–32)
Calcium: 9.6 mg/dL (ref 8.4–10.5)
Chloride: 102 mEq/L (ref 96–112)
Creatinine, Ser: 0.97 mg/dL (ref 0.40–1.50)
GFR: 75.38 mL/min (ref >60.00)
Glucose, Bld: 100 mg/dL — ABNORMAL HIGH (ref 70–99)
Potassium: 4.4 mEq/L (ref 3.5–5.1)
Sodium: 138 mEq/L (ref 135–145)

## 2020-01-02 MED ORDER — OLMESARTAN MEDOXOMIL 20 MG PO TABS: ORAL_TABLET | ORAL | 3 refills | Status: DC

## 2020-01-02 MED ORDER — METHOCARBAMOL 500 MG PO TABS: 500.0000 mg | ORAL_TABLET | Freq: Two times a day (BID) | ORAL | 1 refills | Status: DC

## 2020-01-02 MED ORDER — GABAPENTIN 300 MG PO CAPS: 300.0000 mg | ORAL_CAPSULE | Freq: Every day | ORAL | 3 refills | Status: DC

## 2020-01-02 NOTE — Patient Instructions (Signed)
Go to lab for blood draw.

## 2020-01-02 NOTE — Assessment & Plan Note (Signed)
Controlled with metformin Positive neuropathy, managed with gabapentin at HS only Hx of CKD-3 but negative urine microalbumin LDL at goal with lipitor and lovaza Up to date with eye exam Repeat HgbA1c today

## 2020-01-02 NOTE — Progress Notes (Signed)
Subjective:  Patient ID: Andres Taylor, male    DOB: 02-09-1946  Age: 74 y.o. 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  Reviewed past Medical, Social and Family history today.  Outpatient Medications Prior to Visit  Medication Sig Dispense Refill  . aspirin EC 81 MG tablet Take 81 mg by mouth daily.    [name] BREO ELLIPTA 200-25 MCG/INH AEPB INHALE 1 PUFF INTO THE LUNGS DAILY 60 each 5  . cetirizine (ZYRTEC) 10 MG chewable tablet Chew 10 mg by mouth daily.    . Coenzyme Q10 (COQ10) 100 MG CAPS Take 100 mg by mouth 3 (three) times daily. (Patient taking differently: Take 100 mg by mouth in the morning and at bedtime. ) 30 capsule   . colchicine 0.6 MG tablet Take 1 tablet (0.6 mg total) by mouth 2 (two) times daily as needed (for gout). PRN for Gout 6 tablet 1  . diltiazem (CARDIZEM CD) 180 MG 24 hr capsule TAKE 1 CAPSULE(180 MG) BY MOUTH DAILY 30 capsule 6  . guaiFENesin (MUCINEX) 600 MG 12 hr tablet Take 600 mg by mouth daily.    . hydrochlorothiazide (MICROZIDE) 12.5 MG capsule Take 1 capsule (12.5 mg total) by mouth daily as needed (For swelling). 30 capsule 1  . ipratropium (ATROVENT) 0.06 % nasal spray Place 2 sprays into both nostrils in the morning and at bedtime. 15 mL 3  . montelukast (SINGULAIR) 10 MG tablet  TAKE 1 TABLET(10 MG) BY MOUTH AT BEDTIME 90 tablet 3  . Multiple Vitamin (MULTIVITAMIN) tablet Take 1 tablet by mouth daily.    [name] omega-3 acid ethyl esters (LOVAZA) 1 g capsule Take 1 capsule (1 g total) by mouth daily. 90 capsule 3  . omeprazole (PRILOSEC) 40 MG capsule Take 1 capsule (40 mg total) by mouth daily. 90 capsule 3  . PROAIR RESPICLICK 403 (90 Base) MCG/ACT AEPB INHALE 2 PUFFS EVERY 4-6 HOURS AS NEEDED FOR COUGH/ WHEEZE 1 each 0  . traMADol (ULTRAM) 50 MG tablet Take 1-2 tablets (50-100 mg total) by mouth every 6 (six) hours as needed for moderate pain or severe pain. 20 tablet 0  . allopurinol (ZYLOPRIM) 100 MG tablet TAKE 1 TABLET BY MOUTH DAILY 90 tablet 1  . atorvastatin (LIPITOR) 10 MG tablet Take 1 tablet (10 mg total) by mouth daily at 6 PM. 90 tablet 1  . gabapentin (NEURONTIN) 300 MG capsule Take 1 capsule (300 mg total) by mouth 2 (two) times daily. (Patient taking differently: Take 300 mg by mouth at bedtime. ) 180 capsule 3  . metFORMIN (GLUCOPHAGE) 850 MG tablet Take 1bid (Plz sched appt for June) 180 tablet 0  . methocarbamol (ROBAXIN) 500 MG tablet Take 1 tablet (500 mg total) by mouth every 12 (twelve) hours as needed for muscle spasms. 180 tablet 0  .  olmesartan (BENICAR) 20 MG tablet TAKE 1 TABLET(20 MG) BY MOUTH DAILY. DISCONTINUE LISINOPRIL 90 tablet 1  . fluticasone (FLONASE) 50 MCG/ACT nasal spray as needed.  (Patient not taking: Reported on 01/02/2020)  5   No facility-administered medications prior to visit.    ROS See HPI  Objective:  BP 132/80   Pulse 66   Temp (!) 97.1 F (36.2 C) (Tympanic)   Ht 5\' 7"  (1.702 m)   Wt 243 lb 9.6 oz (110.5 kg)   SpO2 97%   BMI 38.15 kg/m   BP Readings from Last 3 Encounters:  01/02/20 132/80  10/20/19 137/74  09/21/19 118/73   Wt Readings from Last 3 Encounters:  01/02/20 243 lb 9.6 oz (110.5 kg)  10/20/19 235 lb (106.6 kg)  09/21/19 240 lb (108.9 kg)   Physical Exam Constitutional:      Appearance: He  is obese.  Cardiovascular:     Rate and Rhythm: Normal rate and regular rhythm.     Pulses: Normal pulses.     Heart sounds: Normal heart sounds.  Pulmonary:     Effort: Pulmonary effort is normal.     Breath sounds: Normal breath sounds.  Musculoskeletal:     Right lower leg: Edema present.     Left lower leg: Edema present.  Neurological:     Mental Status: He is alert and oriented to person, place, and time.    Lab Results  Component Value Date   WBC 7.2 05/16/2019   HGB 13.3 09/21/2019   HCT 39.0 09/21/2019   PLT 225 05/16/2019   GLUCOSE 100 (H) 01/02/2020   CHOL 118 01/02/2020   TRIG 137.0 01/02/2020   HDL 34.90 (L) 01/02/2020   LDLDIRECT 132.0 09/06/2018   LDLCALC 55 01/02/2020   ALT 21 08/15/2019   AST 18 08/15/2019   NA 138 01/02/2020   K 4.4 01/02/2020   CL 102 01/02/2020   CREATININE 0.97 01/02/2020   BUN 14 01/02/2020   CO2 31 01/02/2020   INR 1.07 08/28/2013   HGBA1C 6.3 01/02/2020   MICROALBUR <0.7 08/15/2019    Assessment & Plan:  This visit occurred during the SARS-CoV-2 public health emergency.  Safety protocols were in place, including screening questions prior to the visit, additional usage of staff PPE, and extensive cleaning of exam room while observing appropriate contact time as indicated for disinfecting solutions.   Andres Taylor was seen today for follow-up.  Diagnoses and all orders for this visit:  HTN (hypertension), benign -     Basic metabolic panel -     olmesartan (BENICAR) 20 MG tablet; TAKE 1 TABLET(20 MG) BY MOUTH DAILY.  Type 2 diabetes mellitus with diabetic polyneuropathy, without long-term current use of insulin (HCC) -     Hemoglobin A1c -     metFORMIN (GLUCOPHAGE) 850 MG tablet; Take 1bid  CKD (chronic kidney disease) stage 2, GFR 60-89 ml/min -     Basic metabolic panel  Mixed hyperlipidemia -     Lipid panel -     atorvastatin (LIPITOR) 10 MG tablet; Take 1 tablet (10 mg total) by mouth daily at 6 PM.  Chronic gout  without tophus, unspecified cause, unspecified site -     Uric acid -     allopurinol (ZYLOPRIM) 100 MG tablet; Take 1 tablet (100 mg total) by mouth daily.  Diabetic polyneuropathy associated with type 2 diabetes mellitus (HCC) -     gabapentin (NEURONTIN) 300 MG capsule; Take 1 capsule (300 mg total) by mouth  at bedtime.  Lumbar radiculopathy, right -     gabapentin (NEURONTIN) 300 MG capsule; Take 1 capsule (300 mg total) by mouth at bedtime. -     methocarbamol (ROBAXIN) 500 MG tablet; Take 1 tablet (500 mg total) by mouth every 12 (twelve) hours.   I have changed Caryn Section. Pulley's olmesartan, gabapentin, methocarbamol, allopurinol, and metFORMIN. I am also having him maintain his aspirin EC, fluticasone, multivitamin, cetirizine, guaiFENesin, CoQ10, omeprazole, diltiazem, traMADol, montelukast, hydrochlorothiazide, colchicine, ProAir RespiClick, ipratropium, omega-3 acid ethyl esters, Breo Ellipta, and atorvastatin.  Meds ordered this encounter  Medications  . olmesartan (BENICAR) 20 MG tablet    Sig: TAKE 1 TABLET(20 MG) BY MOUTH DAILY.    Dispense:  90 tablet    Refill:  3    Order Specific Question:   Supervising Provider    Answer:   Ronnald Nian [[id number]]  . gabapentin (NEURONTIN) 300 MG capsule    Sig: Take 1 capsule (300 mg total) by mouth at bedtime.    Dispense:  90 capsule    Refill:  3    Order Specific Question:   Supervising Provider    Answer:   Ronnald Nian [[id number]]  . methocarbamol (ROBAXIN) 500 MG tablet    Sig: Take 1 tablet (500 mg total) by mouth every 12 (twelve) hours.    Dispense:  180 tablet    Refill:  1    Order Specific Question:   Supervising Provider    Answer:   Ronnald Nian [id number]  . allopurinol (ZYLOPRIM) 100 MG tablet    Sig: Take 1 tablet (100 mg total) by mouth daily.    Dispense:  90 tablet    Refill:  3    Order Specific Question:   Supervising Provider    Answer:   Ronnald Nian [[id number]]  . atorvastatin  (LIPITOR) 10 MG tablet    Sig: Take 1 tablet (10 mg total) by mouth daily at 6 PM.    Dispense:  90 tablet    Refill:  3    Order Specific Question:   Supervising Provider    Answer:   Ronnald Nian [[id number]]  . metFORMIN (GLUCOPHAGE) 850 MG tablet    Sig: Take 1bid    Dispense:  180 tablet    Refill:  3    Order Specific Question:   Supervising Provider    Answer:   Ronnald Nian [[id number]]    Problem List Items Addressed This Visit      Cardiovascular and Mediastinum   HTN (hypertension), benign - Primary (Chronic)   Relevant Medications   olmesartan (BENICAR) 20 MG tablet   atorvastatin (LIPITOR) 10 MG tablet   Other Relevant Orders   Basic metabolic panel (Completed)     Endocrine   Diabetic neuropathy associated with type 2 diabetes mellitus (HCC)   Relevant Medications   olmesartan (BENICAR) 20 MG tablet   gabapentin (NEURONTIN) 300 MG capsule   atorvastatin (LIPITOR) 10 MG tablet   metFORMIN (GLUCOPHAGE) 850 MG tablet   DM (diabetes mellitus), type 2 (HCC)    Controlled with metformin Positive neuropathy, managed with gabapentin at HS only Hx of CKD-3 but negative urine microalbumin LDL at goal with lipitor and lovaza Up to date with eye exam Repeat HgbA1c today      Relevant Medications   olmesartan (BENICAR) 20 MG tablet   atorvastatin (LIPITOR) 10 MG tablet   metFORMIN (GLUCOPHAGE) 850 MG tablet   Other Relevant Orders   Hemoglobin  A1c (Completed)     Nervous and Auditory   Lumbar radiculopathy, right   Relevant Medications   gabapentin (NEURONTIN) 300 MG capsule   methocarbamol (ROBAXIN) 500 MG tablet     Genitourinary   CKD (chronic kidney disease) stage 2, GFR 60-89 ml/min   Relevant Orders   Basic metabolic panel (Completed)     Other   Gout   Relevant Medications   allopurinol (ZYLOPRIM) 100 MG tablet   Other Relevant Orders   Uric acid (Completed)   Mixed hyperlipidemia   Relevant Medications   olmesartan (BENICAR) 20 MG  tablet   atorvastatin (LIPITOR) 10 MG tablet   Other Relevant Orders   Lipid panel (Completed)       Follow-up: Return in about 6 months (around 07/03/2020) for DM and HTN, hyperlipidemia (F2F or video).  Wilfred Lacy, NP

## 2020-01-05 MED ORDER — ATORVASTATIN CALCIUM 10 MG PO TABS: 10.0000 mg | ORAL_TABLET | Freq: Every day | ORAL | 3 refills | Status: DC

## 2020-01-05 MED ORDER — ALLOPURINOL 100 MG PO TABS: 100.0000 mg | ORAL_TABLET | Freq: Every day | ORAL | 3 refills | Status: DC

## 2020-01-05 MED ORDER — METFORMIN HCL 850 MG PO TABS: ORAL_TABLET | ORAL | 3 refills | Status: DC

## 2020-01-22 ENCOUNTER — Other Ambulatory Visit: Payer: Self-pay | Admitting: Nurse Practitioner

## 2020-01-22 DIAGNOSIS — I10 Essential (primary) hypertension: Secondary | ICD-10-CM

## 2020-01-23 DIAGNOSIS — M546 Pain in thoracic spine: Secondary | ICD-10-CM | POA: Diagnosis not present

## 2020-01-23 DIAGNOSIS — M9903 Segmental and somatic dysfunction of lumbar region: Secondary | ICD-10-CM | POA: Diagnosis not present

## 2020-01-23 DIAGNOSIS — M9902 Segmental and somatic dysfunction of thoracic region: Secondary | ICD-10-CM | POA: Diagnosis not present

## 2020-01-23 DIAGNOSIS — M542 Cervicalgia: Secondary | ICD-10-CM | POA: Diagnosis not present

## 2020-01-23 DIAGNOSIS — M9901 Segmental and somatic dysfunction of cervical region: Secondary | ICD-10-CM | POA: Diagnosis not present

## 2020-01-23 DIAGNOSIS — M545 Low back pain: Secondary | ICD-10-CM | POA: Diagnosis not present

## 2020-02-02 DIAGNOSIS — M9901 Segmental and somatic dysfunction of cervical region: Secondary | ICD-10-CM | POA: Diagnosis not present

## 2020-02-02 DIAGNOSIS — M542 Cervicalgia: Secondary | ICD-10-CM | POA: Diagnosis not present

## 2020-02-02 DIAGNOSIS — M9902 Segmental and somatic dysfunction of thoracic region: Secondary | ICD-10-CM | POA: Diagnosis not present

## 2020-02-02 DIAGNOSIS — M545 Low back pain: Secondary | ICD-10-CM | POA: Diagnosis not present

## 2020-02-02 DIAGNOSIS — M9903 Segmental and somatic dysfunction of lumbar region: Secondary | ICD-10-CM | POA: Diagnosis not present

## 2020-02-02 DIAGNOSIS — M546 Pain in thoracic spine: Secondary | ICD-10-CM | POA: Diagnosis not present

## 2020-02-06 DIAGNOSIS — M545 Low back pain: Secondary | ICD-10-CM | POA: Diagnosis not present

## 2020-02-06 DIAGNOSIS — M542 Cervicalgia: Secondary | ICD-10-CM | POA: Diagnosis not present

## 2020-02-06 DIAGNOSIS — M9901 Segmental and somatic dysfunction of cervical region: Secondary | ICD-10-CM | POA: Diagnosis not present

## 2020-02-06 DIAGNOSIS — M9903 Segmental and somatic dysfunction of lumbar region: Secondary | ICD-10-CM | POA: Diagnosis not present

## 2020-02-06 DIAGNOSIS — M546 Pain in thoracic spine: Secondary | ICD-10-CM | POA: Diagnosis not present

## 2020-02-06 DIAGNOSIS — M9902 Segmental and somatic dysfunction of thoracic region: Secondary | ICD-10-CM | POA: Diagnosis not present

## 2020-02-07 DIAGNOSIS — M546 Pain in thoracic spine: Secondary | ICD-10-CM | POA: Diagnosis not present

## 2020-02-07 DIAGNOSIS — M9902 Segmental and somatic dysfunction of thoracic region: Secondary | ICD-10-CM | POA: Diagnosis not present

## 2020-02-07 DIAGNOSIS — M9901 Segmental and somatic dysfunction of cervical region: Secondary | ICD-10-CM | POA: Diagnosis not present

## 2020-02-07 DIAGNOSIS — M545 Low back pain: Secondary | ICD-10-CM | POA: Diagnosis not present

## 2020-02-07 DIAGNOSIS — M542 Cervicalgia: Secondary | ICD-10-CM | POA: Diagnosis not present

## 2020-02-07 DIAGNOSIS — M9903 Segmental and somatic dysfunction of lumbar region: Secondary | ICD-10-CM | POA: Diagnosis not present

## 2020-02-08 DIAGNOSIS — M545 Low back pain: Secondary | ICD-10-CM | POA: Diagnosis not present

## 2020-02-08 DIAGNOSIS — M9902 Segmental and somatic dysfunction of thoracic region: Secondary | ICD-10-CM | POA: Diagnosis not present

## 2020-02-08 DIAGNOSIS — M9901 Segmental and somatic dysfunction of cervical region: Secondary | ICD-10-CM | POA: Diagnosis not present

## 2020-02-08 DIAGNOSIS — M542 Cervicalgia: Secondary | ICD-10-CM | POA: Diagnosis not present

## 2020-02-08 DIAGNOSIS — M9903 Segmental and somatic dysfunction of lumbar region: Secondary | ICD-10-CM | POA: Diagnosis not present

## 2020-02-08 DIAGNOSIS — M546 Pain in thoracic spine: Secondary | ICD-10-CM | POA: Diagnosis not present

## 2020-02-13 DIAGNOSIS — M545 Low back pain: Secondary | ICD-10-CM | POA: Diagnosis not present

## 2020-02-13 DIAGNOSIS — M546 Pain in thoracic spine: Secondary | ICD-10-CM | POA: Diagnosis not present

## 2020-02-13 DIAGNOSIS — M9901 Segmental and somatic dysfunction of cervical region: Secondary | ICD-10-CM | POA: Diagnosis not present

## 2020-02-13 DIAGNOSIS — M9902 Segmental and somatic dysfunction of thoracic region: Secondary | ICD-10-CM | POA: Diagnosis not present

## 2020-02-13 DIAGNOSIS — M9903 Segmental and somatic dysfunction of lumbar region: Secondary | ICD-10-CM | POA: Diagnosis not present

## 2020-02-13 DIAGNOSIS — M542 Cervicalgia: Secondary | ICD-10-CM | POA: Diagnosis not present

## 2020-02-14 DIAGNOSIS — M9903 Segmental and somatic dysfunction of lumbar region: Secondary | ICD-10-CM | POA: Diagnosis not present

## 2020-02-14 DIAGNOSIS — M9902 Segmental and somatic dysfunction of thoracic region: Secondary | ICD-10-CM | POA: Diagnosis not present

## 2020-02-14 DIAGNOSIS — M546 Pain in thoracic spine: Secondary | ICD-10-CM | POA: Diagnosis not present

## 2020-02-14 DIAGNOSIS — M545 Low back pain: Secondary | ICD-10-CM | POA: Diagnosis not present

## 2020-02-14 DIAGNOSIS — M542 Cervicalgia: Secondary | ICD-10-CM | POA: Diagnosis not present

## 2020-02-14 DIAGNOSIS — M9901 Segmental and somatic dysfunction of cervical region: Secondary | ICD-10-CM | POA: Diagnosis not present

## 2020-02-15 DIAGNOSIS — M545 Low back pain: Secondary | ICD-10-CM | POA: Diagnosis not present

## 2020-02-15 DIAGNOSIS — M9903 Segmental and somatic dysfunction of lumbar region: Secondary | ICD-10-CM | POA: Diagnosis not present

## 2020-02-15 DIAGNOSIS — M542 Cervicalgia: Secondary | ICD-10-CM | POA: Diagnosis not present

## 2020-02-15 DIAGNOSIS — M9901 Segmental and somatic dysfunction of cervical region: Secondary | ICD-10-CM | POA: Diagnosis not present

## 2020-02-15 DIAGNOSIS — M9902 Segmental and somatic dysfunction of thoracic region: Secondary | ICD-10-CM | POA: Diagnosis not present

## 2020-02-15 DIAGNOSIS — M546 Pain in thoracic spine: Secondary | ICD-10-CM | POA: Diagnosis not present

## 2020-02-20 ENCOUNTER — Telehealth: Payer: Self-pay

## 2020-02-20 DIAGNOSIS — M542 Cervicalgia: Secondary | ICD-10-CM | POA: Diagnosis not present

## 2020-02-20 DIAGNOSIS — M9903 Segmental and somatic dysfunction of lumbar region: Secondary | ICD-10-CM | POA: Diagnosis not present

## 2020-02-20 DIAGNOSIS — M9901 Segmental and somatic dysfunction of cervical region: Secondary | ICD-10-CM | POA: Diagnosis not present

## 2020-02-20 DIAGNOSIS — M545 Low back pain: Secondary | ICD-10-CM | POA: Diagnosis not present

## 2020-02-20 DIAGNOSIS — M9902 Segmental and somatic dysfunction of thoracic region: Secondary | ICD-10-CM | POA: Diagnosis not present

## 2020-02-20 DIAGNOSIS — M546 Pain in thoracic spine: Secondary | ICD-10-CM | POA: Diagnosis not present

## 2020-02-20 NOTE — Progress Notes (Signed)
I have attempted without success to contact this patient by phone three times to do his hypertension Disease State call. I left a Voice message for patient to return my call.  Bessie Kellihan,CPA Clinical Pharmacist Assistant [phone]    

## 2020-02-21 DIAGNOSIS — M9903 Segmental and somatic dysfunction of lumbar region: Secondary | ICD-10-CM | POA: Diagnosis not present

## 2020-02-21 DIAGNOSIS — M546 Pain in thoracic spine: Secondary | ICD-10-CM | POA: Diagnosis not present

## 2020-02-21 DIAGNOSIS — M9902 Segmental and somatic dysfunction of thoracic region: Secondary | ICD-10-CM | POA: Diagnosis not present

## 2020-02-21 DIAGNOSIS — M542 Cervicalgia: Secondary | ICD-10-CM | POA: Diagnosis not present

## 2020-02-21 DIAGNOSIS — M545 Low back pain: Secondary | ICD-10-CM | POA: Diagnosis not present

## 2020-02-21 DIAGNOSIS — M9901 Segmental and somatic dysfunction of cervical region: Secondary | ICD-10-CM | POA: Diagnosis not present

## 2020-02-22 DIAGNOSIS — M546 Pain in thoracic spine: Secondary | ICD-10-CM | POA: Diagnosis not present

## 2020-02-22 DIAGNOSIS — M545 Low back pain: Secondary | ICD-10-CM | POA: Diagnosis not present

## 2020-02-22 DIAGNOSIS — M542 Cervicalgia: Secondary | ICD-10-CM | POA: Diagnosis not present

## 2020-02-22 DIAGNOSIS — M9903 Segmental and somatic dysfunction of lumbar region: Secondary | ICD-10-CM | POA: Diagnosis not present

## 2020-02-22 DIAGNOSIS — M9901 Segmental and somatic dysfunction of cervical region: Secondary | ICD-10-CM | POA: Diagnosis not present

## 2020-02-22 DIAGNOSIS — M9902 Segmental and somatic dysfunction of thoracic region: Secondary | ICD-10-CM | POA: Diagnosis not present

## 2020-02-23 DIAGNOSIS — H524 Presbyopia: Secondary | ICD-10-CM | POA: Diagnosis not present

## 2020-02-23 DIAGNOSIS — H43393 Other vitreous opacities, bilateral: Secondary | ICD-10-CM | POA: Diagnosis not present

## 2020-02-23 DIAGNOSIS — Z7984 Long term (current) use of oral hypoglycemic drugs: Secondary | ICD-10-CM | POA: Diagnosis not present

## 2020-02-23 DIAGNOSIS — H52203 Unspecified astigmatism, bilateral: Secondary | ICD-10-CM | POA: Diagnosis not present

## 2020-02-23 DIAGNOSIS — H5203 Hypermetropia, bilateral: Secondary | ICD-10-CM | POA: Diagnosis not present

## 2020-02-23 DIAGNOSIS — H25043 Posterior subcapsular polar age-related cataract, bilateral: Secondary | ICD-10-CM | POA: Diagnosis not present

## 2020-02-23 DIAGNOSIS — H16223 Keratoconjunctivitis sicca, not specified as Sjogren's, bilateral: Secondary | ICD-10-CM | POA: Diagnosis not present

## 2020-02-23 DIAGNOSIS — E119 Type 2 diabetes mellitus without complications: Secondary | ICD-10-CM | POA: Diagnosis not present

## 2020-02-23 DIAGNOSIS — H2513 Age-related nuclear cataract, bilateral: Secondary | ICD-10-CM | POA: Diagnosis not present

## 2020-02-28 DIAGNOSIS — D485 Neoplasm of uncertain behavior of skin: Secondary | ICD-10-CM | POA: Diagnosis not present

## 2020-02-28 DIAGNOSIS — L570 Actinic keratosis: Secondary | ICD-10-CM | POA: Diagnosis not present

## 2020-02-28 DIAGNOSIS — Z85828 Personal history of other malignant neoplasm of skin: Secondary | ICD-10-CM | POA: Diagnosis not present

## 2020-02-28 DIAGNOSIS — L814 Other melanin hyperpigmentation: Secondary | ICD-10-CM | POA: Diagnosis not present

## 2020-02-28 DIAGNOSIS — D225 Melanocytic nevi of trunk: Secondary | ICD-10-CM | POA: Diagnosis not present

## 2020-02-29 DIAGNOSIS — M546 Pain in thoracic spine: Secondary | ICD-10-CM | POA: Diagnosis not present

## 2020-02-29 DIAGNOSIS — M542 Cervicalgia: Secondary | ICD-10-CM | POA: Diagnosis not present

## 2020-02-29 DIAGNOSIS — M9902 Segmental and somatic dysfunction of thoracic region: Secondary | ICD-10-CM | POA: Diagnosis not present

## 2020-02-29 DIAGNOSIS — M545 Low back pain: Secondary | ICD-10-CM | POA: Diagnosis not present

## 2020-02-29 DIAGNOSIS — M9901 Segmental and somatic dysfunction of cervical region: Secondary | ICD-10-CM | POA: Diagnosis not present

## 2020-02-29 DIAGNOSIS — M9903 Segmental and somatic dysfunction of lumbar region: Secondary | ICD-10-CM | POA: Diagnosis not present

## 2020-03-12 DIAGNOSIS — M546 Pain in thoracic spine: Secondary | ICD-10-CM | POA: Diagnosis not present

## 2020-03-12 DIAGNOSIS — M545 Low back pain: Secondary | ICD-10-CM | POA: Diagnosis not present

## 2020-03-12 DIAGNOSIS — M9903 Segmental and somatic dysfunction of lumbar region: Secondary | ICD-10-CM | POA: Diagnosis not present

## 2020-03-12 DIAGNOSIS — M542 Cervicalgia: Secondary | ICD-10-CM | POA: Diagnosis not present

## 2020-03-12 DIAGNOSIS — M9902 Segmental and somatic dysfunction of thoracic region: Secondary | ICD-10-CM | POA: Diagnosis not present

## 2020-03-12 DIAGNOSIS — M9901 Segmental and somatic dysfunction of cervical region: Secondary | ICD-10-CM | POA: Diagnosis not present

## 2020-03-13 DIAGNOSIS — M9902 Segmental and somatic dysfunction of thoracic region: Secondary | ICD-10-CM | POA: Diagnosis not present

## 2020-03-13 DIAGNOSIS — M542 Cervicalgia: Secondary | ICD-10-CM | POA: Diagnosis not present

## 2020-03-13 DIAGNOSIS — M546 Pain in thoracic spine: Secondary | ICD-10-CM | POA: Diagnosis not present

## 2020-03-13 DIAGNOSIS — M9901 Segmental and somatic dysfunction of cervical region: Secondary | ICD-10-CM | POA: Diagnosis not present

## 2020-03-13 DIAGNOSIS — M545 Low back pain: Secondary | ICD-10-CM | POA: Diagnosis not present

## 2020-03-13 DIAGNOSIS — M9903 Segmental and somatic dysfunction of lumbar region: Secondary | ICD-10-CM | POA: Diagnosis not present

## 2020-03-17 DIAGNOSIS — Z23 Encounter for immunization: Secondary | ICD-10-CM | POA: Diagnosis not present

## 2020-03-28 ENCOUNTER — Telehealth: Payer: Self-pay

## 2020-03-28 IMAGING — DX DG CHEST 2V
2 series · 2 of 2 positions shown · non-contrast
Comparison: 05/09/2018

CLINICAL DATA: Shortness of breath

EXAM:
CHEST - 2 VIEW

[chest pa]
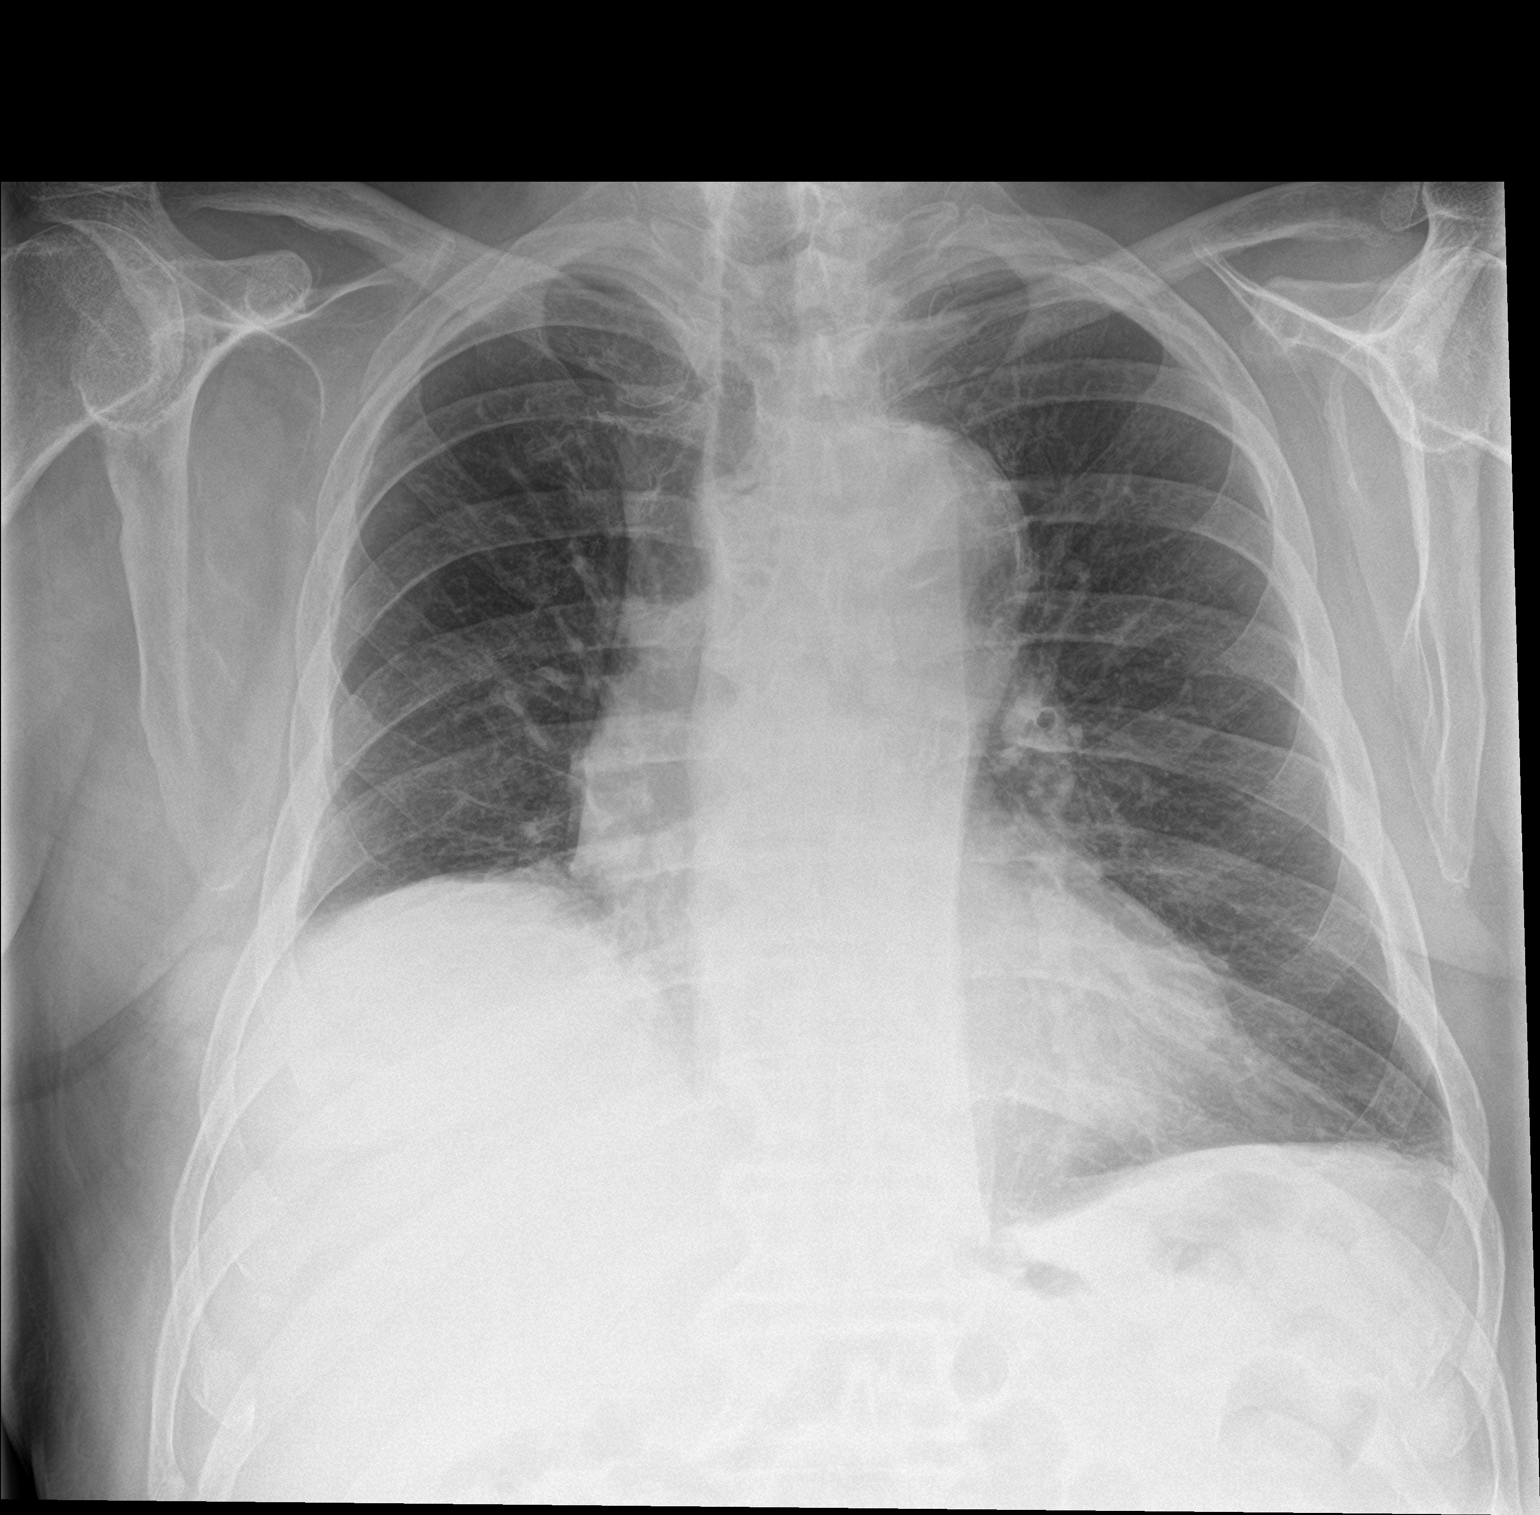

[chest lat]
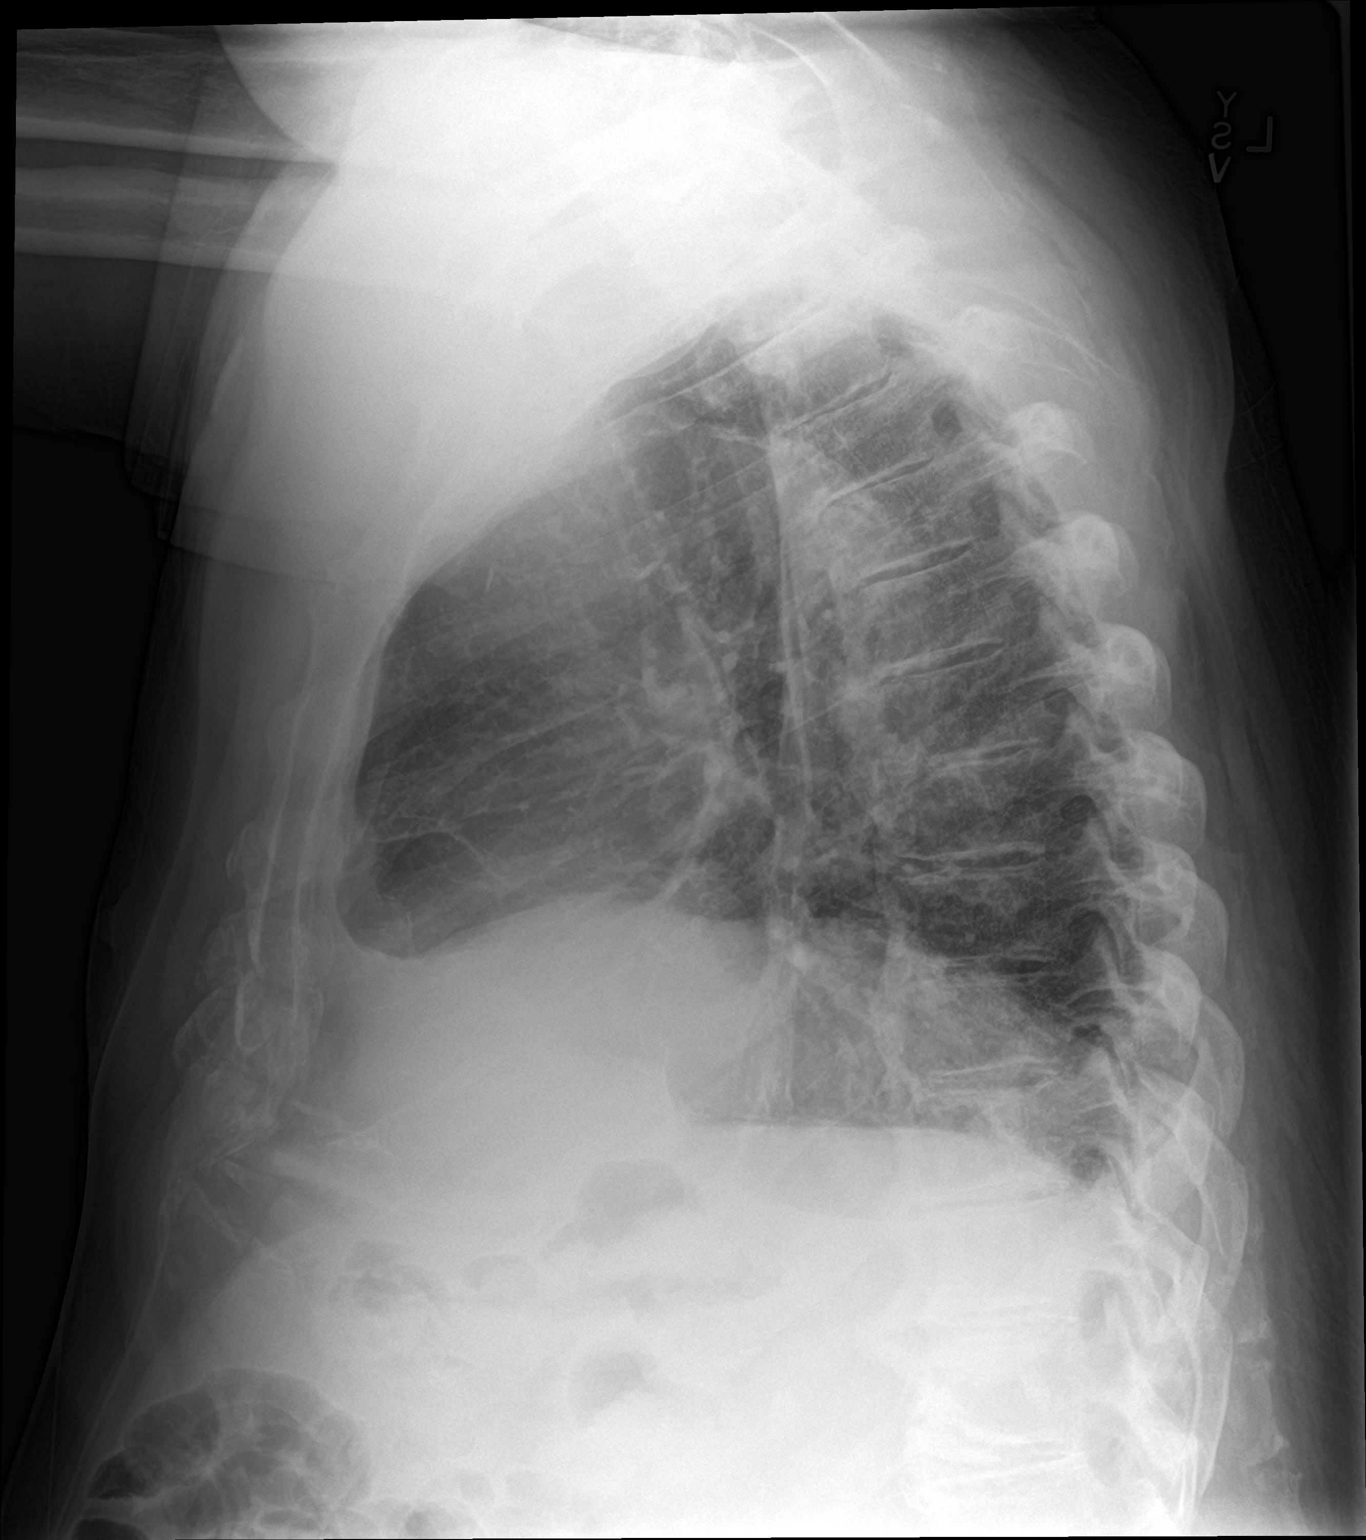

[2 of 2 positions shown; findings below may reference images not displayed]

FINDINGS: Cardiomegaly. Aortic atherosclerosis. Unchanged elevation of the
right hemidiaphragm. Disc degenerative disease of the thoracic
spine.
IMPRESSION: Cardiomegaly without acute abnormality of the lungs. Unchanged
elevation of the right hemidiaphragm.

## 2020-03-28 NOTE — Progress Notes (Signed)
I have attempted without success to contact this patient by phone three times to do his Hypertension and Diabetes Disease State call. I left a Voice message for patient to return my call.   [place] Pharmacist Assistant [phone]   Maryjean Ka

## 2020-04-11 ENCOUNTER — Telehealth: Payer: Self-pay

## 2020-04-11 NOTE — Progress Notes (Signed)
Chronic Care Management Pharmacy Assistant   Name: Andres Taylor  MRN: [id number] DOB: [birth year]  Reason for Encounter: Medication Review    PCP : [name], NP  Allergies:  Not on File  Medications: Outpatient Encounter Medications as of 04/11/2020  Medication Sig  . allopurinol (ZYLOPRIM) 100 MG tablet Take 1 tablet (100 mg total) by mouth daily.  [name] aspirin EC 81 MG tablet Take 81 mg by mouth daily.  [name] atorvastatin (LIPITOR) 10 MG tablet Take 1 tablet (10 mg total) by mouth daily at 6 PM.  . BREO ELLIPTA 200-25 MCG/INH AEPB INHALE 1 PUFF INTO THE LUNGS DAILY  . cetirizine (ZYRTEC) 10 MG chewable tablet Chew 10 mg by mouth daily.  . Coenzyme Q10 (COQ10) 100 MG CAPS Take 100 mg by mouth 3 (three) times daily. (Patient taking differently: Take 100 mg by mouth in the morning and at bedtime. )  . colchicine 0.6 MG tablet Take 1 tablet (0.6 mg total) by mouth 2 (two) times daily as needed (for gout). PRN for Gout  . diltiazem (CARDIZEM CD) 180 MG 24 hr capsule TAKE 1 CAPSULE(180 MG) BY MOUTH DAILY  . fluticasone (FLONASE) 50 MCG/ACT nasal spray as needed.  (Patient not taking: Reported on 01/02/2020)  . gabapentin (NEURONTIN) 300 MG capsule Take 1 capsule (300 mg total) by mouth at bedtime.  [name] guaiFENesin (MUCINEX) 600 MG 12 hr tablet Take 600 mg by mouth daily.  . hydrochlorothiazide (MICROZIDE) 12.5 MG capsule TAKE 1 CAPSULE(12.5 MG) BY MOUTH DAILY AS NEEDED FOR SWELLING  . ipratropium (ATROVENT) 0.06 % nasal spray Place 2 sprays into both nostrils in the morning and at bedtime.  . metFORMIN (GLUCOPHAGE) 850 MG tablet Take 1bid  . methocarbamol (ROBAXIN) 500 MG tablet Take 1 tablet (500 mg total) by mouth every 12 (twelve) hours.  . montelukast (SINGULAIR) 10 MG tablet TAKE 1 TABLET(10 MG) BY MOUTH AT BEDTIME  . Multiple Vitamin (MULTIVITAMIN) tablet Take 1 tablet by mouth daily.  [name] olmesartan (BENICAR) 20 MG tablet TAKE 1 TABLET(20 MG) BY MOUTH DAILY.  [name] omega-3  acid ethyl esters (LOVAZA) 1 g capsule Take 1 capsule (1 g total) by mouth daily.  [name] omeprazole (PRILOSEC) 40 MG capsule Take 1 capsule (40 mg total) by mouth daily.  [name] PROAIR RESPICLICK 585 (90 Base) MCG/ACT AEPB INHALE 2 PUFFS EVERY 4-6 HOURS AS NEEDED FOR COUGH/ WHEEZE  . traMADol (ULTRAM) 50 MG tablet Take 1-2 tablets (50-100 mg total) by mouth every 6 (six) hours as needed for moderate pain or severe pain.   No facility-administered encounter medications on file as of 04/11/2020.    Current Diagnosis: Patient Active Problem List   Diagnosis Date Noted  . Difficult airway for intubation 09/21/2019  . Bilateral inguinal hernia (BIH) 09/21/2019  . Asthma 05/25/2019  . Abdominal bloating 03/13/2019  . Indigestion 03/13/2019  . Heme positive stool 03/13/2019  . Orchitis, right 03/13/2019  . Generalized abdominal pain 03/13/2019  . Chronic cough 09/08/2018  . Lumbar radiculopathy, right 06/07/2018  . 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 CKD (chronic kidney disease) stage 2, GFR 60-89 ml/min 05/17/2018  . OSA on CPAP 05/17/2018  . Restrictive lung disease 05/17/2018  . Cervical radiculopathy 05/17/2018  . Diabetic neuropathy associated with type 2 diabetes mellitus ([place]) 05/17/2018  . DM (diabetes mellitus), type 2 ([place]) 05/17/2018  . Gout 05/17/2018  . Dermatochalasis of both upper eyelids 02/25/2017  . Acute angle-closure glaucoma of both  eyes 12/04/2016  . Hyperopia with astigmatism and presbyopia, bilateral 12/01/2016  . Nuclear sclerotic cataract of both eyes 12/01/2016  . Posterior subcapsular age-related cataract of both eyes 12/01/2016  . Vitreous floater, bilateral 12/01/2016  . Lung mass 08/30/2013  . Moderate obesity 04/18/2013  . 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 Pulmonary HTN ([place]) 04/18/2013  . Pre-operative cardiovascular examination 04/18/2013  . 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 Dyspnea on  exertion 04/17/2013  . Leg edema   . Mixed hyperlipidemia   . HTN (hypertension), benign   .  Right upper lobe lung nodule 03/10/2013    Goals Addressed   None     Follow-Up:  Medication Cost Review   Completed medication cost analysis for patient current medication.  [place] Pharmacist Assistant [phone]

## 2020-04-12 ENCOUNTER — Telehealth: Payer: Self-pay

## 2020-04-12 NOTE — Progress Notes (Signed)
Left a voice message to confirmed patient telephone  appointment on 04/15/2020 for CCM at 2:00pm with Junius Argyle the Clinical pharmacist.   [place] Pharmacist Assistant [phone]

## 2020-04-15 ENCOUNTER — Other Ambulatory Visit: Payer: Self-pay | Admitting: Cardiology

## 2020-04-15 ENCOUNTER — Telehealth: Payer: Medicare Other

## 2020-04-15 NOTE — Chronic Care Management (AMB) (Deleted)
Chronic Care Management Pharmacy  Name: Andres Taylor  MRN: [id number] DOB: January 15, 1946  Chief Complaint/ HPI  Andres Taylor,  74 y.o. , male presents for their Initial CCM visit with the clinical pharmacist via telephone due to COVID-19 Pandemic.  PCP : [name], NP  Their chronic conditions include: hypertension, asthma, type 2 diabetes, hyperlipidemia, gout  Office Visits: 01/02/20: Patient presented to [facility] for follow-up. Gabapentin decreased to 300 mg daily.  10/16/19: CPP Visit  Consult Visits:  02/23/20: Patient presented to Dr. Trinna Post (Ophthalmology) for Diabetic Eye Exam 10/20/19: Patient presented to [name], PA-C (Pulmonology) for follow-up. No medication changes made.    Medications: Outpatient Encounter Medications as of 04/15/2020  Medication Sig   allopurinol (ZYLOPRIM) 100 MG tablet Take 1 tablet (100 mg total) by mouth daily.   aspirin EC 81 MG tablet Take 81 mg by mouth daily.   atorvastatin (LIPITOR) 10 MG tablet Take 1 tablet (10 mg total) by mouth daily at 6 PM.   BREO ELLIPTA 200-25 MCG/INH AEPB INHALE 1 PUFF INTO THE LUNGS DAILY   cetirizine (ZYRTEC) 10 MG chewable tablet Chew 10 mg by mouth daily.   Coenzyme Q10 (COQ10) 100 MG CAPS Take 100 mg by mouth 3 (three) times daily. (Patient taking differently: Take 100 mg by mouth in the morning and at bedtime. )   colchicine 0.6 MG tablet Take 1 tablet (0.6 mg total) by mouth 2 (two) times daily as needed (for gout). PRN for Gout   diltiazem (CARDIZEM CD) 180 MG 24 hr capsule TAKE 1 CAPSULE(180 MG) BY MOUTH DAILY   fluticasone (FLONASE) 50 MCG/ACT nasal spray as needed.  (Patient not taking: Reported on 01/02/2020)   gabapentin (NEURONTIN) 300 MG capsule Take 1 capsule (300 mg total) by mouth at bedtime.   guaiFENesin (MUCINEX) 600 MG 12 hr tablet Take 600 mg by mouth daily.   hydrochlorothiazide (MICROZIDE) 12.5 MG capsule TAKE 1 CAPSULE(12.5 MG) BY MOUTH DAILY AS  NEEDED FOR SWELLING   ipratropium (ATROVENT) 0.06 % nasal spray Place 2 sprays into both nostrils in the morning and at bedtime.   metFORMIN (GLUCOPHAGE) 850 MG tablet Take 1bid   methocarbamol (ROBAXIN) 500 MG tablet Take 1 tablet (500 mg total) by mouth every 12 (twelve) hours.   montelukast (SINGULAIR) 10 MG tablet TAKE 1 TABLET(10 MG) BY MOUTH AT BEDTIME   Multiple Vitamin (MULTIVITAMIN) tablet Take 1 tablet by mouth daily.   olmesartan (BENICAR) 20 MG tablet TAKE 1 TABLET(20 MG) BY MOUTH DAILY.   omega-3 acid ethyl esters (LOVAZA) 1 g capsule Take 1 capsule (1 g total) by mouth daily.   omeprazole (PRILOSEC) 40 MG capsule Take 1 capsule (40 mg total) by mouth daily.   PROAIR RESPICLICK 694 (90 Base) MCG/ACT AEPB INHALE 2 PUFFS EVERY 4-6 HOURS AS NEEDED FOR COUGH/ WHEEZE   traMADol (ULTRAM) 50 MG tablet Take 1-2 tablets (50-100 mg total) by mouth every 6 (six) hours as needed for moderate pain or severe pain.   No facility-administered encounter medications on file as of 04/15/2020.    Current Diagnosis/Assessment:  Goals Addressed   None     Asthma / Restrictive Lung Disease   Eosinophil count:   Lab Results  Component Value Date/Time   EOSPCT 0 12/03/2013 08:30 PM  %                               Eos (Absolute):  Lab Results  Component Value Date/Time   EOSABS 0.0 12/03/2013 08:30 PM    Tobacco Status:  Social History   Tobacco Use  Smoking Status Former Smoker   Packs/day: 1.50   Years: 42.00   Pack years: 63.00   Types: Cigarettes   Quit date: 07/13/2002   Years since quitting: 17.7  Smokeless Tobacco Former User    Patient has failed these meds in past: n/a Patient is currently controlled on the following medications:   Proventil 0.083% neb (does not use)  ProAir Respiclick 329 mcg/act 2 puff q4-6 hr PRN   Breo Ellipta 200-25 mcg/inh 1 puff daily   Montelukast 10 mg nightly PRN     Cetirizine 10 mg chew daily   Flonase 50 mcg/act daily  PRN (does not use)  Guaifenesin 600 mg dailly   Ipatropium 0.06% nasal spray (uses 1-2x daily)  Using maintenance inhaler regularly? Yes Frequency of rescue inhaler use:  infrequently  We discussed: Patient reports breathing significantly improved since he has started taking his Breo inhaler consistently every day. His coughing symptoms have significantly improved recently as well. His breathing does worsen somewhat due to allergies.   Plan  Recommend decreasing guaifenesin to PRN use and seeing if cough remains stable.  Continue all other medications. ,  Diabetes   Recent Relevant Labs: Lab Results  Component Value Date/Time   HGBA1C 6.3 01/02/2020 01:34 PM   HGBA1C 6.6 (H) 08/15/2019 08:56 AM   MICROALBUR <0.7 08/15/2019 08:56 AM   MICROALBUR 1.0 06/06/2018 11:14 AM     Checking BG: Never  Recent FBG Readings: n/a Recent pre-meal BG readings: n/a Recent 2hr PP BG readings:  n/a Recent HS BG readings: n/a  Patient has failed these meds in past: n/a Patient is currently controlled on the following medications:   Metformin 850 mg twice daily  A1c goal <7%   Last diabetic Foot exam:  Lab Results  Component Value Date/Time   HMDIABEYEEXA No Retinopathy 06/23/2019 12:00 AM    Last diabetic Eye exam: No results found for: HMDIABFOOTEX   We discussed: diet and exercise extensively Patient avoids sweets. Primarily drinks water, Zero Gatorade. 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 Wife  checks weekly.   Patient home BP readings are ranging: patient unsure   We discussed diet and exercise extensively. Patient denies symptoms of headache, dizziness, or unsteadiness. Patient reports his cough has completely resolved since switching from lisinopril to olmesartan.    Plan  Continue current medications     Hyperlipidemia   Lipid Panel     Component Value Date/Time   CHOL 118 01/02/2020 1334   TRIG 137.0 01/02/2020 1334   HDL 34.90 (L) 01/02/2020 1334   CHOLHDL 3 01/02/2020 1334   VLDL 27.4 01/02/2020 1334   LDLCALC 55 01/02/2020 1334   LDLDIRECT 132.0 09/06/2018 1059     The ASCVD Risk score (Goff DC Jr., et al., 2013) failed to calculate for the following reasons:  The valid total cholesterol range is 130 to 320 mg/dL   Patient has failed these meds in past: n/a Patient is currently controlled on the following medications:   Atorvastatin 10 mg daily   Lovaza 2g BID (started 09/06/18)   Aspirin 81 mg daily   We discussed:  diet and exercise extensively  Plan Given significant reduction in triglycerides, recommend decreasing Lovaza to 2g daily or discontinuing completely to reduce cost/polypharmacy burden on patient.   Gout   Uric Acid 12/06/18 6.6   Patient has failed these meds in past: n/a Patient is currently controlled on the following medications:  Allopurinol 100 mg daily   Colchicine 0.6 mg BID PRN   We discussed: Patient has not had gout attack or symptoms of gout in many years despite slightly elevated Uric Acid level.    Plan  Continue current medications   Chronic Pain   Patient has failed these meds in past: n/a Patient is currently managed on the following medications:   Gabapentin 300 mg BID   Methocarbamol 500 mg BID PRN- takes most days for back pain  We discussed: Patient mainly takes gabapentin PRN in the evenings to help manage his restless leg. Denies symptoms of CNS depression.   Plan  Continue current  medications   Misc/OTC   Co Q10 100 mg TID - recommended by Dr. Glenetta Hew. Only takes BID  Multivitamin daily  Vaccines   Reviewed and discussed patient's vaccination history.    Immunization History  Administered Date(s) Administered   Fluad Quad(high Dose 65+) 07/03/2019   Influenza Whole 05/10/2007, 07/08/2010   Influenza,inj,Quad PF,6+ Mos 04/17/2018   Influenza-Unspecified 07/14/2011, 06/29/2013, 03/30/2014, 04/09/2015, 05/19/2016   PFIZER SARS-COV-2 Vaccination 08/05/2019, 08/27/2019   Pneumococcal Conjugate-13 01/05/2012, 06/29/2013, 03/13/2014   Pneumococcal Polysaccharide-23 02/15/2013, 04/09/2015   Td 02/15/2013   Tdap 07/08/2010, 07/13/2013   Zoster 07/21/2010, 02/15/2013    Medication Management   Pt uses [facility] for all medications. No >5 day gap noted on external fill history.   Follow up: 6 months  Doristine Section [phone]

## 2020-04-26 ENCOUNTER — Telehealth: Payer: Self-pay | Admitting: Nurse Practitioner

## 2020-04-26 NOTE — Telephone Encounter (Signed)
Patient dropped off Disability Parking Placard form for Andres Taylor to fill out.

## 2020-05-02 ENCOUNTER — Ambulatory Visit: Payer: Self-pay

## 2020-05-02 DIAGNOSIS — I10 Essential (primary) hypertension: Secondary | ICD-10-CM

## 2020-05-02 DIAGNOSIS — E1142 Type 2 diabetes mellitus with diabetic polyneuropathy: Secondary | ICD-10-CM

## 2020-05-02 NOTE — Chronic Care Management (AMB) (Signed)
Patient called today stating he had been experiencing cold extremities and wondering if it is being caused by olmesartan. Patient symptoms more likely to be related to his ongoing neuropathy rather and olmesartan or other medications.   He also requested a new Rx for furosemide. He states he uses this medication very sporadically (last refilled >2 years ago), but that it is very helpful when he has significant swelling. He takes HCTZ daily for his blood pressure.   Doristine Section Clinical Pharmacist [facility]  [phone]

## 2020-05-07 ENCOUNTER — Encounter: Payer: Self-pay | Admitting: Cardiology

## 2020-05-07 ENCOUNTER — Ambulatory Visit (INDEPENDENT_AMBULATORY_CARE_PROVIDER_SITE_OTHER): Payer: Medicare Other | Admitting: Cardiology

## 2020-05-07 ENCOUNTER — Other Ambulatory Visit: Payer: Self-pay

## 2020-05-07 VITALS — BP 128/66 | HR 67 | Ht 67.5 in | Wt 242.2 lb

## 2020-05-07 DIAGNOSIS — E668 Other obesity: Secondary | ICD-10-CM | POA: Diagnosis not present

## 2020-05-07 DIAGNOSIS — I10 Essential (primary) hypertension: Secondary | ICD-10-CM

## 2020-05-07 DIAGNOSIS — I272 Pulmonary hypertension, unspecified: Secondary | ICD-10-CM

## 2020-05-07 DIAGNOSIS — E782 Mixed hyperlipidemia: Secondary | ICD-10-CM

## 2020-05-07 DIAGNOSIS — Z9989 Dependence on other enabling machines and devices: Secondary | ICD-10-CM | POA: Diagnosis not present

## 2020-05-07 DIAGNOSIS — R600 Localized edema: Secondary | ICD-10-CM

## 2020-05-07 DIAGNOSIS — G4733 Obstructive sleep apnea (adult) (pediatric): Secondary | ICD-10-CM | POA: Diagnosis not present

## 2020-05-07 DIAGNOSIS — I208 Other forms of angina pectoris: Secondary | ICD-10-CM

## 2020-05-07 MED ORDER — FUROSEMIDE 20 MG PO TABS
20.0000 mg | ORAL_TABLET | ORAL | 6 refills | Status: DC | PRN
Start: 2020-05-07 — End: 2020-11-28

## 2020-05-07 NOTE — Progress Notes (Signed)
Primary Care Provider: [name], NP Cardiologist: Glenetta Hew, MD Electrophysiologist: None  Clinic Note: Chief Complaint  Patient presents with  . Follow-up    6 month  . Shortness of Breath    stable    HPI:    Andres Taylor is a 74 y.o. male with a PMH below who presents today for delayed 41-month follow-up.  hypertension, hyperlipidemia and diabetes mellitus-2 along with ?COPD and restrictive lung disease from longstanding history of smoking ; Epworth score 10.  Problem List Items Addressed This Visit    Leg edema (Chronic)   HTN (hypertension), benign (Chronic)   Moderate obesity (Chronic)   Pulmonary HTN ([place]) - Primary (Chronic)   Mixed hyperlipidemia     I last saw him on June 23, 2019 for preop evaluation for his hernia repair.  Echocardiogram and Myoview reviewed below.  Cleared for surgery.  Has exertional dyspnea after 100 to 150 feet.  Feels tired and short of breath.  No chest discomfort.  Being evaluated for OSA.  Recent Hospitalizations:   September 21, 2019-laparoscopic bilateral inguinal hernia repair.  Tye Vigo was last seen on October 20, 2019 by [name], PA-this was in follow-up from laparoscopic hernia repair that was complicated due to scar tissue from previous ruptured appendix.  Was not recovering as quickly as expected. ->  Overall stable from a cardiac standpoint.  No changes.  Reviewed  CV studies:    The following studies were reviewed today: (if available, images/films reviewed: From Epic Chart or Care Everywhere) . No new studies:   Interval History:   Andres Taylor is here today for routine follow-up doing well.  No major complaints.  He says he has off-and-on swelling in 1 or 2 days a week.  Goes down with bring his feet up, but it bothers him a little bit. He does state that he is sleeping much better with CPAP feels less fatigued of the day.  Less dyspneic.  He really has been on mobile since  his surgery, and has gained some weight.  He little frustrated by this but knows he needs to get back into doing some exercise and watching his diet.  With CPAP, he does not have any PND orthopnea.  He has little orthopnea without CPAP.  Stable edema, but may be a little bit worse.  No chest pain or pressure with rest or exertion.  CV Review of Symptoms (Summary): positive for - dyspnea on exertion, edema, orthopnea, shortness of breath and Not orthopneic if he is using CPAP.  And notes no PND negative for - chest pain, irregular heartbeat, palpitations, paroxysmal nocturnal dyspnea, rapid heart rate or Syncope/near syncope or TIA/amaurosis fugax  The patient does not have symptoms concerning for COVID-19 infection (fever, chills, cough, or new shortness of breath).   REVIEWED OF SYSTEMS   Review of Systems  Constitutional: Negative for malaise/fatigue (Does not have a lot of energy, but not fatigue.) and weight loss (He actually gained weight.).  HENT: Negative for nosebleeds.   Respiratory: Positive for shortness of breath (Stable baseline exertional dyspnea).        Sleeping better with CPAP.  Cardiovascular: Positive for leg swelling (Off-and-on edema.).  Gastrointestinal: Negative for blood in stool, constipation and melena.  Musculoskeletal: Positive for back pain (Chronic low back pain.).  Neurological: Negative for dizziness and headaches.  Psychiatric/Behavioral: Negative for depression and memory loss. The patient is not nervous/anxious and does not have insomnia.  I have reviewed and (if needed) personally updated the patient's problem list, medications, allergies, past medical and surgical history, social and family history.   PAST MEDICAL HISTORY   Past Medical History:  Diagnosis Date  . Allergic rhinitis   . Allergy   . Arthritis   . Asthma   . Blood in urine   . Cataract    forming   . Chronic cough   . CKD (chronic kidney disease), stage II   . DDD  (degenerative disc disease)   . Diabetes mellitus ([place])   . DOE (dyspnea on exertion)   . Eustachian tube dysfunction   . Fatty liver   . GERD (gastroesophageal reflux disease)   . Glaucoma   . Gout   . Hard of hearing    bilateral   . History of cardiomegaly 05/16/2019   Noted on CXR  . History of kidney stones   . Hyperlipidemia   . Hypertension   . Inguinal hernia    Bilateral  . Joint pain, knee   . Leg edema   . Lung tumor (benign), right    Right upper lobe lung mass  . Obesity   . OSA on CPAP   . Pneumonitis    right lung  . Restrictive lung disease   . Sciatica   . Spinal stenosis   . Wears contact lenses   . Wears glasses   . Wears hearing aid in both ears     PAST SURGICAL HISTORY   Past Surgical History:  Procedure Laterality Date  . APPENDECTOMY  1976  . COLONOSCOPY  08/2010  . COLONOSCOPY  08/07/2019  . [place]  2001  . [place]   as a baby 4 months old  . INGUINAL HERNIA REPAIR Bilateral 09/21/2019   Procedure: LAPAROSCOPIC BILATERAL INGUINAL HERNIA REPAIR, LYSIS OF ADHESIONS;  Surgeon: Michael Boston, MD;  Location: [place];  Service: General;  Laterality: Bilateral;  . LEFT AND RIGHT HEART CATHETERIZATION WITH CORONARY ANGIOGRAM N/A 04/20/2013   Procedure: LEFT AND RIGHT HEART CATHETERIZATION WITH CORONARY ANGIOGRAM;  Surgeon: Leonie Man, MD;  Location: [facility] CATH LAB;  Angiographically normal Coronary Arteries - L Dom Cx (OM1, small LPL1 then LPL2 & PDA) & wraparound LAD (D1-3 small).  Borderline CO/CI - 4.94/2.16 by both Fick & TD.  RAP ~14 mmHg, RVEDP 15 mmHg.  LVEDP 19 mmHg (->diastolic dysfunction) PCWP 16 mmHg, but PA Mean 25 mmHg  . LITHOTRIPSY     x 2  . LOBECTOMY Right   . lumbar disectomy L4-5  1980  . NM MYOVIEW LTD  06/15/2019    EF 70%.  No ischemia or infarction.  LOW RISK   . TRANSTHORACIC ECHOCARDIOGRAM  04/21/2019   EF 60 to 65%.  Mild LVH.  Normal RV.  Normal atrial size.   Mild aortic sclerosis.  No stenosis. Lipomatous intra-atrial septum  . UPPER GI ENDOSCOPY  08/07/2019  . VIDEO ASSISTED THORACOSCOPY (VATS)/WEDGE RESECTION Right 08/30/2013   Procedure: VIDEO ASSISTED THORACOSCOPY (VATS)/WEDGE RESECTION;  Surgeon: Melrose Nakayama, MD;  Location: [place];  Service: Thoracic;  Laterality: Right;    Immunization History  Administered Date(s) Administered  . Fluad Quad(high Dose 65+) 07/03/2019  . Influenza Whole 05/10/2007, 07/08/2010  . Influenza,inj,Quad PF,6+ Mos 04/17/2018  . Influenza-Unspecified 07/14/2011, 06/29/2013, 03/30/2014, 04/09/2015, 05/19/2016  . PFIZER SARS-COV-2 Vaccination 08/05/2019, 08/27/2019, 04/03/2020  . Pneumococcal Conjugate-13 01/05/2012, 06/29/2013, 03/13/2014  . Pneumococcal Polysaccharide-23 02/15/2013, 04/09/2015  . Td 02/15/2013  .  Tdap 07/08/2010, 07/13/2013  . Zoster 07/21/2010, 02/15/2013    MEDICATIONS/ALLERGIES   Current Meds  Medication Sig  . allopurinol (ZYLOPRIM) 100 MG tablet Take 1 tablet (100 mg total) by mouth daily.  [name] aspirin EC 81 MG tablet Take 81 mg by mouth daily.  [name] atorvastatin (LIPITOR) 10 MG tablet Take 1 tablet (10 mg total) by mouth daily at 6 PM.  . BREO ELLIPTA 200-25 MCG/INH AEPB INHALE 1 PUFF INTO THE LUNGS DAILY  . cetirizine (ZYRTEC) 10 MG chewable tablet Chew 10 mg by mouth daily.  . Coenzyme Q-10 100 MG capsule Take 100 mg by mouth 2 (two) times daily.  . colchicine 0.6 MG tablet Take 1 tablet (0.6 mg total) by mouth 2 (two) times daily as needed (for gout). PRN for Gout  . diltiazem (CARDIZEM CD) 180 MG 24 hr capsule TAKE 1 CAPSULE(180 MG) BY MOUTH DAILY  . gabapentin (NEURONTIN) 300 MG capsule Take 1 capsule (300 mg total) by mouth at bedtime.  [name] guaiFENesin (MUCINEX) 600 MG 12 hr tablet Take 600 mg by mouth daily.  . hydrochlorothiazide (MICROZIDE) 12.5 MG capsule TAKE 1 CAPSULE(12.5 MG) BY MOUTH DAILY AS NEEDED FOR SWELLING  . ipratropium (ATROVENT) 0.06 % nasal spray Place 2  sprays into both nostrils in the morning and at bedtime.  . metFORMIN (GLUCOPHAGE) 850 MG tablet Take 1bid  . methocarbamol (ROBAXIN) 500 MG tablet Take 1 tablet (500 mg total) by mouth every 12 (twelve) hours.  . montelukast (SINGULAIR) 10 MG tablet TAKE 1 TABLET(10 MG) BY MOUTH AT BEDTIME  . Multiple Vitamin (MULTIVITAMIN) tablet Take 1 tablet by mouth daily.  [name] olmesartan (BENICAR) 20 MG tablet TAKE 1 TABLET(20 MG) BY MOUTH DAILY.  [name] omega-3 acid ethyl esters (LOVAZA) 1 g capsule Take 1 capsule (1 g total) by mouth daily.  [name] PROAIR RESPICLICK 101 (90 Base) MCG/ACT AEPB INHALE 2 PUFFS EVERY 4-6 HOURS AS NEEDED FOR COUGH/ WHEEZE  . [DISCONTINUED] Coenzyme Q10 (COQ10) 100 MG CAPS Take 100 mg by mouth 3 (three) times daily. (Patient taking differently: Take 100 mg by mouth in the morning and at bedtime. )  . [DISCONTINUED] fluticasone (FLONASE) 50 MCG/ACT nasal spray as needed.     Not on File  SOCIAL HISTORY/FAMILY HISTORY   Reviewed in Epic:  Pertinent findings: Has started on CPAP.  OBJCTIVE -PE, EKG, labs   Wt Readings from Last 3 Encounters:  05/07/20 242 lb 3.2 oz (109.9 kg)  01/02/20 243 lb 9.6 oz (110.5 kg)  10/20/19 235 lb (106.6 kg)    Physical Exam: BP 128/66   Pulse 67   Ht 5' 7.5" (1.715 m)   Wt 242 lb 3.2 oz (109.9 kg)   BMI 37.37 kg/m  Physical Exam Vitals reviewed.  Constitutional:      General: He is not in acute distress.    Appearance: He is not ill-appearing or toxic-appearing.     Comments: Given comorbidities, BMI of 37.7 is morbidly obese.  Well-groomed and otherwise healthy appearing.  HENT:     Head: Normocephalic and atraumatic.  Neck:     Vascular: No carotid bruit or JVD (Unable to assess due to body habitus).  Cardiovascular:     Rate and Rhythm: Normal rate and regular rhythm.     Chest Wall: PMI is not displaced (Unable to palpate).     Pulses: Intact distal pulses.     Heart sounds: S1 normal and S2 normal. Heart sounds are distant. No  murmur heard.  No friction  rub. No gallop.   Pulmonary:     Effort: Pulmonary effort is normal. No respiratory distress.     Breath sounds: Decreased breath sounds (Decreased throughout.  Mostly distant) present. No wheezing, rhonchi or rales.     Comments: Baseline social muscle use, nonlabored. Musculoskeletal:        General: Swelling (Trivial bilateral LE) present. Normal range of motion.     Cervical back: Normal range of motion and neck supple.  Neurological:     General: No focal deficit present.     Mental Status: He is alert and oriented to person, place, and time. Mental status is at baseline.  Psychiatric:        Mood and Affect: Mood normal.        Behavior: Behavior normal.        Thought Content: Thought content normal.        Judgment: Judgment normal.     Adult ECG Report  Rate: 67 ;  Rhythm: normal sinus rhythm and Normal axis, intervals & durations;   Narrative Interpretation: normal  Recent Labs:    Lab Results  Component Value Date   CHOL 118 01/02/2020   HDL 34.90 (L) 01/02/2020   LDLCALC 55 01/02/2020   LDLDIRECT 132.0 09/06/2018   TRIG 137.0 01/02/2020   CHOLHDL 3 01/02/2020   Lab Results  Component Value Date   CREATININE 0.97 01/02/2020   BUN 14 01/02/2020   NA 138 01/02/2020   K 4.4 01/02/2020   CL 102 01/02/2020   CO2 31 01/02/2020   No results found for: TSH  ASSESSMENT/PLAN    Problem List Items Addressed This Visit    Leg edema (Chronic)    The intention was to have furosemide added as PRN.  However this never got added.  We will restart furosemide as needed which she will take in lieu of HCTZ.      Relevant Orders   EKG 12-Lead (Completed)   Mixed hyperlipidemia (Chronic)    Labs look great with LDL 55 on 10 mg of atorvastatin plus Lovaza. . With fatty liver disease, we would recommend aggressive lipid management, but monitor liver function.      Relevant Medications   furosemide (LASIX) 20 MG tablet   HTN (hypertension),  benign (Chronic)    Stable blood pressure.  He is on diltiazem irbesartan HCTZ.  Using diltiazem for combination rate control and vasodilation with likely pulmonary hypertension      Relevant Medications   furosemide (LASIX) 20 MG tablet   Other Relevant Orders   EKG 12-Lead (Completed)   Moderate obesity (Chronic)    Moderate obesity by BMI, but with comorbidities would be considered morbid obesity.  Stressed importance of dietary modification, increase exercise with plans for weight loss.      Pulmonary HTN ([facility]) - Primary (Chronic)    Not necessarily seen as far as evidence on echocardiogram.  However with sleep apnea and obesity, some component of OHS is likely if not restrictive lung disease from body habitus.  We started diltiazem with plans to stop HCTZ however he is still taking it, but noting some edema.  Plan: Allow for as needed Lasix 20 mg for edema which she will take in lieu of HCTZ. Continue diltiazem. Continue CPAP Weight loss recommendation      Relevant Medications   furosemide (LASIX) 20 MG tablet   Other Relevant Orders   EKG 12-Lead (Completed)   OSA on CPAP (Chronic)    Sleeping better now that  he is on CPAP.      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  Microvascular ischemia rule out with stress test.  Plan: Continue diltiazem at current dose.      Relevant Medications   furosemide (LASIX) 20 MG tablet       COVID-19 Education: The signs and symptoms of COVID-19 were discussed with the patient and how to seek care for testing (follow up with PCP or arrange E-visit).   The importance of social distancing and COVID-19 vaccination was discussed today.  The patient is practicing social distancing & Masking.   I spent a total of 21 minutes with the patient spent in direct patient consultation.  Additional time spent with chart review  / charting (studies, outside notes, etc): 10 Total Time: 31 min   Current  medicines are reviewed at length with the patient today.  (+/- concerns) none  This visit occurred during the SARS-CoV-2 public health emergency.  Safety protocols were in place, including screening questions prior to the visit, additional usage of staff PPE, and extensive cleaning of exam room while observing appropriate contact time as indicated for disinfecting solutions.  Notice: This dictation was prepared with Dragon dictation along with smaller phrase technology. Any transcriptional errors that result from this process are unintentional and may not be corrected upon review.  Patient Instructions / Medication Changes & Studies & Tests Ordered   Patient Instructions  Medication Instructions:  MAY YOU LASIX  20 MG ( FUROSEMIDE)  INSTEAD OF YOUR HCTZ  AS NEEDED FOR SWELLING  *If you need a refill on your cardiac medications before your next appointment, please call your pharmacy*   Lab Work: NOT NEEDED   Testing/Procedures: NOT NEEDED   Follow-Up: At [facility], you and your health needs are our priority.  As part of our continuing mission to provide you with exceptional heart care, we have created designated Provider Care Teams.  These Care Teams include your primary Cardiologist (physician) and Advanced Practice Providers (APPs -  Physician Assistants and Nurse Practitioners) who all work together to provide you with the care you need, when you need it.  We recommend signing up for the patient portal called "MyChart".  Sign up information is provided on this After Visit Summary.  MyChart is used to connect with patients for Virtual Visits (Telemedicine).  Patients are able to view lab/test results, encounter notes, upcoming appointments, etc.  Non-urgent messages can be sent to your provider as well.   To learn more about what you can do with MyChart, go to [email or web address].    Your next appointment:   12 month(s)  The format for your next appointment:   In  Person  Provider:   Glenetta Hew, MD   Other Instructions     Studies Ordered:   Orders Placed This Encounter  Procedures  . EKG 12-Lead     Glenetta Hew, M.D., M.S. Interventional Cardiologist   Pager # [phone] Phone # [phone] [address] [place], [place] [address]   Thank you for choosing Heartcare at [facility]!!

## 2020-05-07 NOTE — Patient Instructions (Signed)
Medication Instructions:  MAY YOU LASIX  20 MG ( FUROSEMIDE)  INSTEAD OF YOUR HCTZ  AS NEEDED FOR SWELLING  *If you need a refill on your cardiac medications before your next appointment, please call your pharmacy*   Lab Work: NOT NEEDED   Testing/Procedures: NOT NEEDED   Follow-Up: At [facility], you and your health needs are our priority.  As part of our continuing mission to provide you with exceptional heart care, we have created designated Provider Care Teams.  These Care Teams include your primary Cardiologist (physician) and Advanced Practice Providers (APPs -  Physician Assistants and Nurse Practitioners) who all work together to provide you with the care you need, when you need it.  We recommend signing up for the patient portal called "MyChart".  Sign up information is provided on this After Visit Summary.  MyChart is used to connect with patients for Virtual Visits (Telemedicine).  Patients are able to view lab/test results, encounter notes, upcoming appointments, etc.  Non-urgent messages can be sent to your provider as well.   To learn more about what you can do with MyChart, go to [email or web address].    Your next appointment:   12 month(s)  The format for your next appointment:   In Person  Provider:   Glenetta Hew, MD   Other Instructions

## 2020-05-09 ENCOUNTER — Telehealth: Payer: Self-pay

## 2020-05-09 NOTE — Progress Notes (Signed)
Left a voice message to touch base with patient to see if his symptoms of Cold extremities improved.  [place] Pharmacist Assistant [phone]

## 2020-05-17 ENCOUNTER — Encounter: Payer: Self-pay | Admitting: Cardiology

## 2020-05-17 NOTE — Assessment & Plan Note (Signed)
Stable blood pressure.  He is on diltiazem irbesartan HCTZ.  Using diltiazem for combination rate control and vasodilation with likely pulmonary hypertension

## 2020-05-17 NOTE — Assessment & Plan Note (Addendum)
Not necessarily seen as far as evidence on echocardiogram.  However with sleep apnea and obesity, some component of OHS is likely if not restrictive lung disease from body habitus.  We started diltiazem with plans to stop HCTZ however he is still taking it, but noting some edema.  Plan: Allow for as needed Lasix 20 mg for edema which she will take in lieu of HCTZ. Continue diltiazem. Continue CPAP Weight loss recommendation

## 2020-05-17 NOTE — Assessment & Plan Note (Signed)
Sleeping better now that he is on CPAP.

## 2020-05-17 NOTE — Assessment & Plan Note (Signed)
The intention was to have furosemide added as PRN.  However this never got added.  We will restart furosemide as needed which she will take in lieu of HCTZ.

## 2020-05-17 NOTE — Assessment & Plan Note (Signed)
Moderate obesity by BMI, but with comorbidities would be considered morbid obesity.  Stressed importance of dietary modification, increase exercise with plans for weight loss.

## 2020-05-17 NOTE — Assessment & Plan Note (Signed)
Labs look great with LDL 55 on 10 mg of atorvastatin plus Lovaza. . With fatty liver disease, we would recommend aggressive lipid management, but monitor liver function.

## 2020-05-17 NOTE — Assessment & Plan Note (Signed)
Cannot exclude microvascular ischemia.  Microvascular ischemia rule out with stress test.  Plan: Continue diltiazem at current dose.

## 2020-05-20 ENCOUNTER — Telehealth: Payer: Self-pay

## 2020-05-20 DIAGNOSIS — I10 Essential (primary) hypertension: Secondary | ICD-10-CM

## 2020-05-20 NOTE — Progress Notes (Signed)
Chronic Care Management Pharmacy Assistant   Name: Andres Taylor  MRN: [id number] DOB: [birth year]  Reason for Encounter:Hypertension  Disease State Call.  PCP : [name], NP  Allergies:  Not on File  Medications: Outpatient Encounter Medications as of 05/20/2020  Medication Sig  . allopurinol (ZYLOPRIM) 100 MG tablet Take 1 tablet (100 mg total) by mouth daily.  [name] aspirin EC 81 MG tablet Take 81 mg by mouth daily.  [name] atorvastatin (LIPITOR) 10 MG tablet Take 1 tablet (10 mg total) by mouth daily at 6 PM.  . BREO ELLIPTA 200-25 MCG/INH AEPB INHALE 1 PUFF INTO THE LUNGS DAILY  . cetirizine (ZYRTEC) 10 MG chewable tablet Chew 10 mg by mouth daily.  . Coenzyme Q-10 100 MG capsule Take 100 mg by mouth 2 (two) times daily.  . colchicine 0.6 MG tablet Take 1 tablet (0.6 mg total) by mouth 2 (two) times daily as needed (for gout). PRN for Gout  . diltiazem (CARDIZEM CD) 180 MG 24 hr capsule TAKE 1 CAPSULE(180 MG) BY MOUTH DAILY  . furosemide (LASIX) 20 MG tablet Take 1 tablet (20 mg total) by mouth as needed. DO NOT TAKE HCTZ THE DAY YOU USE MEDICATION AS NEEDED FOR SWELLING OR SHORTNESS OF BREATH  . gabapentin (NEURONTIN) 300 MG capsule Take 1 capsule (300 mg total) by mouth at bedtime.  [name] guaiFENesin (MUCINEX) 600 MG 12 hr tablet Take 600 mg by mouth daily.  . hydrochlorothiazide (MICROZIDE) 12.5 MG capsule TAKE 1 CAPSULE(12.5 MG) BY MOUTH DAILY AS NEEDED FOR SWELLING  . ipratropium (ATROVENT) 0.06 % nasal spray Place 2 sprays into both nostrils in the morning and at bedtime.  . metFORMIN (GLUCOPHAGE) 850 MG tablet Take 1bid  . methocarbamol (ROBAXIN) 500 MG tablet Take 1 tablet (500 mg total) by mouth every 12 (twelve) hours.  . montelukast (SINGULAIR) 10 MG tablet TAKE 1 TABLET(10 MG) BY MOUTH AT BEDTIME  . Multiple Vitamin (MULTIVITAMIN) tablet Take 1 tablet by mouth daily.  [name] olmesartan (BENICAR) 20 MG tablet TAKE 1 TABLET(20 MG) BY MOUTH DAILY.  [name] omega-3 acid ethyl  esters (LOVAZA) 1 g capsule Take 1 capsule (1 g total) by mouth daily.  [name] omeprazole (PRILOSEC) 40 MG capsule Take 1 capsule (40 mg total) by mouth daily.  [name] PROAIR RESPICLICK 673 (90 Base) MCG/ACT AEPB INHALE 2 PUFFS EVERY 4-6 HOURS AS NEEDED FOR COUGH/ WHEEZE   No facility-administered encounter medications on file as of 05/20/2020.    Current Diagnosis: Patient Active Problem List   Diagnosis Date Noted  . Difficult airway for intubation 09/21/2019  . Bilateral inguinal hernia (BIH) 09/21/2019  . Asthma 05/25/2019  . Abdominal bloating 03/13/2019  . Indigestion 03/13/2019  . Heme positive stool 03/13/2019  . Orchitis, right 03/13/2019  . Generalized abdominal pain 03/13/2019  . Chronic cough 09/08/2018  . Lumbar radiculopathy, right 06/07/2018  . 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 CKD (chronic kidney disease) stage 2, GFR 60-89 ml/min 05/17/2018  . OSA on CPAP 05/17/2018  . Restrictive lung disease 05/17/2018  . Cervical radiculopathy 05/17/2018  . Diabetic neuropathy associated with type 2 diabetes mellitus ([place]) 05/17/2018  . DM (diabetes mellitus), type 2 ([place]) 05/17/2018  . Gout 05/17/2018  . Dermatochalasis of both upper eyelids 02/25/2017  . Acute angle-closure glaucoma of both eyes 12/04/2016  . Hyperopia with astigmatism and presbyopia, bilateral 12/01/2016  . Nuclear sclerotic cataract of both eyes 12/01/2016  . Posterior subcapsular age-related cataract  of both eyes 12/01/2016  . Vitreous floater, bilateral 12/01/2016  . Lung mass 08/30/2013  . Moderate obesity 04/18/2013  . 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 Pulmonary HTN ([place]) 04/18/2013  . Pre-operative cardiovascular examination 04/18/2013  . 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 Dyspnea on exertion 04/17/2013  . Leg edema   . Mixed hyperlipidemia   . HTN (hypertension), benign   .  Right upper lobe lung nodule 03/10/2013    Follow-Up:   Pharmacist Review   Reviewed chart prior to disease state call. Spoke with patient regarding BP  Recent Office Vitals: BP Readings from Last 3 Encounters:  05/07/20 128/66  01/02/20 132/80  10/20/19 137/74   Pulse Readings from Last 3 Encounters:  05/07/20 67  01/02/20 66  10/20/19 68    Wt Readings from Last 3 Encounters:  05/07/20 242 lb 3.2 oz (109.9 kg)  01/02/20 243 lb 9.6 oz (110.5 kg)  10/20/19 235 lb (106.6 kg)     Kidney Function Lab Results  Component Value Date/Time   CREATININE 0.97 01/02/2020 01:34 PM   CREATININE 1.20 09/21/2019 06:18 AM   CREATININE 0.93 04/18/2013 02:00 PM   GFR 75.38 01/02/2020 01:34 PM   GFRNONAA >60 05/16/2019 04:27 PM   GFRAA >60 05/16/2019 04:27 PM    BMP Latest Ref Rng & Units 01/02/2020 09/21/2019 05/16/2019  Glucose 70 - 99 mg/dL 100(H) 118(H) 135(H)  BUN 6 - 23 mg/dL 14 21 14   Creatinine 0.40 - 1.50 mg/dL 0.97 1.20 1.14  BUN/Creat Ratio 10 - 24 - - -  Sodium 135 - 145 mEq/L 138 138 138  Potassium 3.5 - 5.1 mEq/L 4.4 3.8 4.3  Chloride 96 - 112 mEq/L 102 99 100  CO2 19 - 32 mEq/L 31 - 26  Calcium 8.4 - 10.5 mg/dL 9.6 - 9.1    . Current antihypertensive regimen:    Diltiazem CD 180 mg daily               HCTZ 12.5 mg daily             Olmesartan 20 mg daily . How often are you checking your Blood Pressure? infrequently . Current home BP readings:  o Patient states he really does not check his blood pressure a lot and have no idea of what they are ranging. . What recent interventions/DTPs have been made by any provider to improve Blood Pressure control since last CPP Visit: None ID . Any recent hospitalizations or ED visits since last visit with CPP? No . What diet changes have been made to improve Blood Pressure Control?  o Patient states he cooks at home and eats out using salt for taste. . What exercise is being done to improve your Blood Pressure Control?  o Patient states he does not exercise at this time.  Adherence  Review: Is the patient currently on ACE/ARB medication? Yes Does the patient have >5 day gap between last estimated fill dates? Yes   [facility] Pharmacist Assistant [phone]

## 2020-06-04 ENCOUNTER — Other Ambulatory Visit: Payer: Self-pay | Admitting: Nurse Practitioner

## 2020-06-14 ENCOUNTER — Other Ambulatory Visit: Payer: Self-pay | Admitting: Nurse Practitioner

## 2020-06-14 DIAGNOSIS — M5416 Radiculopathy, lumbar region: Secondary | ICD-10-CM

## 2020-06-14 DIAGNOSIS — E1142 Type 2 diabetes mellitus with diabetic polyneuropathy: Secondary | ICD-10-CM

## 2020-06-14 NOTE — Telephone Encounter (Signed)
Please see message and advise.  Thank you. ° °

## 2020-06-14 NOTE — Telephone Encounter (Signed)
Last OV 01/02/20 Last fill 01/02/20  #90/3

## 2020-06-18 ENCOUNTER — Other Ambulatory Visit: Payer: Self-pay | Admitting: Adult Health

## 2020-06-21 ENCOUNTER — Other Ambulatory Visit: Payer: Self-pay | Admitting: Pulmonary Disease

## 2020-06-21 MED ORDER — IPRATROPIUM BROMIDE 0.06 % NA SOLN: 2.0000 | Freq: Two times a day (BID) | NASAL | 6 refills | Status: DC

## 2020-06-21 NOTE — Progress Notes (Unsigned)
atrovent nasal refilled

## 2020-06-21 NOTE — Telephone Encounter (Signed)
Please advise on patient mychart message. Advised patient he needs to make an appointment with the office since it has been a year.  I can't get the pharmacy to refill my nasal spray prescription

## 2020-06-21 NOTE — Telephone Encounter (Signed)
Refill for Atrovent nasal sent to pharmacy

## 2020-06-27 ENCOUNTER — Other Ambulatory Visit: Payer: Self-pay | Admitting: Nurse Practitioner

## 2020-06-27 DIAGNOSIS — M1A9XX0 Chronic gout, unspecified, without tophus (tophi): Secondary | ICD-10-CM

## 2020-06-27 NOTE — Telephone Encounter (Signed)
Last fill 01/05/20  #90/3 Last OV 01/02/20

## 2020-06-29 ENCOUNTER — Other Ambulatory Visit: Payer: Self-pay | Admitting: Nurse Practitioner

## 2020-06-29 DIAGNOSIS — I10 Essential (primary) hypertension: Secondary | ICD-10-CM

## 2020-07-01 NOTE — Telephone Encounter (Signed)
Last OV 01/02/20 Last fill 01/22/20  #90/1

## 2020-07-23 ENCOUNTER — Other Ambulatory Visit: Payer: Self-pay | Admitting: Gastroenterology

## 2020-07-29 ENCOUNTER — Telehealth: Payer: Self-pay

## 2020-07-29 NOTE — Progress Notes (Signed)
Chronic Care Management Pharmacy Assistant   Name: Andres Taylor  MRN: [id number] DOB: Sep 21, 1945  Reason for Encounter: Medication Review/General Adherence Call.  Patient Questions:  1.  Have you seen any other providers since your last visit? No  2.  Any changes in your medicines or health? No    PCP : [name], NP  Allergies:  Not on File  Medications: Outpatient Encounter Medications as of 07/29/2020  Medication Sig  . allopurinol (ZYLOPRIM) 100 MG tablet TAKE 1 TABLET BY MOUTH DAILY  . aspirin EC 81 MG tablet Take 81 mg by mouth daily.  [name] atorvastatin (LIPITOR) 10 MG tablet Take 1 tablet (10 mg total) by mouth daily at 6 PM.  . BREO ELLIPTA 200-25 MCG/INH AEPB INHALE 1 PUFF INTO THE LUNGS DAILY  . cetirizine (ZYRTEC) 10 MG chewable tablet Chew 10 mg by mouth daily.  . Coenzyme Q-10 100 MG capsule Take 100 mg by mouth 2 (two) times daily.  . colchicine 0.6 MG tablet Take 1 tablet (0.6 mg total) by mouth 2 (two) times daily as needed (for gout). PRN for Gout  . diltiazem (CARDIZEM CD) 180 MG 24 hr capsule TAKE 1 CAPSULE(180 MG) BY MOUTH DAILY  . furosemide (LASIX) 20 MG tablet Take 1 tablet (20 mg total) by mouth as needed. DO NOT TAKE HCTZ THE DAY YOU USE MEDICATION AS NEEDED FOR SWELLING OR SHORTNESS OF BREATH  . gabapentin (NEURONTIN) 300 MG capsule TAKE 1 CAPSULE(300 MG) BY MOUTH TWICE DAILY  . guaiFENesin (MUCINEX) 600 MG 12 hr tablet Take 600 mg by mouth daily.  . hydrochlorothiazide (MICROZIDE) 12.5 MG capsule TAKE 1 CAPSULE(12.5 MG) BY MOUTH DAILY AS NEEDED FOR SWELLING  . ipratropium (ATROVENT) 0.06 % nasal spray Place 2 sprays into both nostrils in the morning and at bedtime.  . metFORMIN (GLUCOPHAGE) 850 MG tablet Take 1bid  . methocarbamol (ROBAXIN) 500 MG tablet Take 1 tablet (500 mg total) by mouth every 12 (twelve) hours.  . montelukast (SINGULAIR) 10 MG tablet TAKE 1 TABLET(10 MG) BY MOUTH AT BEDTIME  . Multiple Vitamin (MULTIVITAMIN) tablet  Take 1 tablet by mouth daily.  [name] olmesartan (BENICAR) 20 MG tablet TAKE 1 TABLET(20 MG) BY MOUTH DAILY.  [name] omega-3 acid ethyl esters (LOVAZA) 1 g capsule Take 1 capsule (1 g total) by mouth daily.  [name] omeprazole (PRILOSEC) 40 MG capsule TAKE 1 CAPSULE(40 MG) BY MOUTH DAILY  . PROAIR RESPICLICK 846 (90 Base) MCG/ACT AEPB INHALE 2 PUFFS EVERY 4-6 HOURS AS NEEDED FOR COUGH/ WHEEZE   No facility-administered encounter medications on file as of 07/29/2020.    Current Diagnosis: Patient Active Problem List   Diagnosis Date Noted  . Difficult airway for intubation 09/21/2019  . Bilateral inguinal hernia (BIH) 09/21/2019  . Asthma 05/25/2019  . Abdominal bloating 03/13/2019  . Indigestion 03/13/2019  . Heme positive stool 03/13/2019  . Orchitis, right 03/13/2019  . Generalized abdominal pain 03/13/2019  . Chronic cough 09/08/2018  . Lumbar radiculopathy, right 06/07/2018  . 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 CKD (chronic kidney disease) stage 2, GFR 60-89 ml/min 05/17/2018  . OSA on CPAP 05/17/2018  . Restrictive lung disease 05/17/2018  . Cervical radiculopathy 05/17/2018  . Diabetic neuropathy associated with type 2 diabetes mellitus ([place]) 05/17/2018  . DM (diabetes mellitus), type 2 ([place]) 05/17/2018  . Gout 05/17/2018  . Dermatochalasis of both upper eyelids 02/25/2017  . Acute angle-closure glaucoma of both eyes 12/04/2016  .  Hyperopia with astigmatism and presbyopia, bilateral 12/01/2016  . Nuclear sclerotic cataract of both eyes 12/01/2016  . Posterior subcapsular age-related cataract of both eyes 12/01/2016  . Vitreous floater, bilateral 12/01/2016  . Lung mass 08/30/2013  . Moderate obesity 04/18/2013  . 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 Pulmonary HTN ([place]) 04/18/2013  . Pre-operative cardiovascular examination 04/18/2013  . 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 Dyspnea on exertion 04/17/2013  . Leg edema   .  Mixed hyperlipidemia   . HTN (hypertension), benign   .  Right upper lobe lung nodule 03/10/2013    Goals Addressed   None    Called patient and discussed medication adherence  with patient, no issues at this time with current medication.   Patient denies ED visit since her last CPP follow up.  Patient denies any side effects with her medication. Patient denies any problems with her current pharmacy  Patient states he is doing great with no concerns at this time.Patient reports he does cook at home using salt for taste.     Follow-Up:  Pharmacist Review   Bessie [facility] Pharmacist Assistant [phone]

## 2020-09-09 ENCOUNTER — Other Ambulatory Visit: Payer: Self-pay | Admitting: Nurse Practitioner

## 2020-09-09 DIAGNOSIS — M5416 Radiculopathy, lumbar region: Secondary | ICD-10-CM

## 2020-09-27 ENCOUNTER — Other Ambulatory Visit: Payer: Self-pay | Admitting: Nurse Practitioner

## 2020-09-27 ENCOUNTER — Other Ambulatory Visit: Payer: Self-pay | Admitting: Cardiology

## 2020-09-27 DIAGNOSIS — R053 Chronic cough: Secondary | ICD-10-CM

## 2020-09-27 DIAGNOSIS — E782 Mixed hyperlipidemia: Secondary | ICD-10-CM

## 2020-09-27 DIAGNOSIS — I10 Essential (primary) hypertension: Secondary | ICD-10-CM

## 2020-10-02 ENCOUNTER — Other Ambulatory Visit: Payer: Self-pay

## 2020-10-02 ENCOUNTER — Ambulatory Visit (INDEPENDENT_AMBULATORY_CARE_PROVIDER_SITE_OTHER): Payer: Medicare Other | Admitting: Adult Health

## 2020-10-02 ENCOUNTER — Encounter: Payer: Self-pay | Admitting: Adult Health

## 2020-10-02 VITALS — BP 140/64 | HR 71 | Temp 97.9°F | Ht 68.0 in | Wt 243.6 lb

## 2020-10-02 DIAGNOSIS — Z9989 Dependence on other enabling machines and devices: Secondary | ICD-10-CM | POA: Diagnosis not present

## 2020-10-02 DIAGNOSIS — J4530 Mild persistent asthma, uncomplicated: Secondary | ICD-10-CM | POA: Diagnosis not present

## 2020-10-02 DIAGNOSIS — G4733 Obstructive sleep apnea (adult) (pediatric): Secondary | ICD-10-CM | POA: Diagnosis not present

## 2020-10-02 MED ORDER — IPRATROPIUM BROMIDE 0.06 % NA SOLN: 2.0000 | Freq: Two times a day (BID) | NASAL | 6 refills | Status: DC

## 2020-10-02 NOTE — Progress Notes (Signed)
@Patient  ID: Andres Taylor, male    DOB: 1946/07/02, 75 y.o.   MRN: [id number]  Chief Complaint  Patient presents with  . Follow-up    Referring provider: [name], NP  HPI: 75 year old male former smoker quit 2004 seen for pulmonary consult January 17, 2019 for shortness of breath and cough.  Found to have restrictive lung disease with an asthmatic component. Medical history significant for mild obstructive sleep apnea   allergic rhinitis, obesity, diabetes, benign right upper lung mass status post resection 2015     TEST/EVENTS :  Chest x-ray May 16, 2019 cardiomegaly, unchanged elevation of right hemidiaphragm  EchoOctober 2020 EF 60 to 65%,mildly elevated pulmonary artery systolic pressure  CT chest report 2014 showed some fibrotic changes. CT chest January 2015 2.7 cm nodule in the right upper lobe  Chest x-ray 2019 no acute process  Previous echo in 2014 showed moderate pulmonary hypertension however repeat echo last month April 21, 2019 showed mild pulmonary hypertension at most  Social history : Former smoker  Working Biochemist, clinical . Adult nurse estate. No [employer] . No basement . No hot tubs . No travel .  Travels often. Last in [place] , [place], [place] 2018-2019.  10/02/2020 Follow up : OSA , Asthma  Patient returns for a follow-up visit.  Last seen December 2020.  Patient has underlying asthma.  Says overall breathing has been doing well.  He denies any flare of cough or wheezing.  No increased albuterol use.  Does have chronic allergies uses Singulair and Zyrtec daily.   Patient has obstructive sleep apnea is on CPAP at bedtime.  CPAP download was requested.  Patient says he has been wearing his CPAP each night.  Usually gets in about 6 to 7 hours of sleep.  Feels that it really helps him.  Patient denies any increased daytime sleepiness.  Cough resolved off ACE    Not on File  Immunization History  Administered Date(s) Administered   . Fluad Quad(high Dose 65+) 07/03/2019  . Influenza Whole 05/10/2007, 07/08/2010  . Influenza,inj,Quad PF,6+ Mos 04/17/2018  . Influenza-Unspecified 07/14/2011, 06/29/2013, 03/30/2014, 04/09/2015, 05/19/2016  . PFIZER(Purple Top)SARS-COV-2 Vaccination 08/05/2019, 08/27/2019, 04/03/2020  . Pneumococcal Conjugate-13 01/05/2012, 06/29/2013, 03/13/2014  . Pneumococcal Polysaccharide-23 02/15/2013, 04/09/2015  . Td 02/15/2013  . Tdap 07/08/2010, 07/13/2013  . Zoster 07/21/2010, 02/15/2013    Past Medical History:  Diagnosis Date  . Allergic rhinitis   . Allergy   . Arthritis   . Asthma   . Blood in urine   . Cataract    forming   . Chronic cough   . CKD (chronic kidney disease), stage II   . DDD (degenerative disc disease)   . Diabetes mellitus ([place])   . DOE (dyspnea on exertion)   . Eustachian tube dysfunction   . Fatty liver   . GERD (gastroesophageal reflux disease)   . Glaucoma   . Gout   . Hard of hearing    bilateral   . History of cardiomegaly 05/16/2019   Noted on CXR  . History of kidney stones   . Hyperlipidemia   . Hypertension   . Inguinal hernia    Bilateral  . Joint pain, knee   . Leg edema   . Lung tumor (benign), right    Right upper lobe lung mass  . Obesity   . OSA on CPAP   . Pneumonitis    right lung  . Restrictive lung disease   . Sciatica   . Spinal  stenosis   . Wears contact lenses   . Wears glasses   . Wears hearing aid in both ears     Tobacco History: Social History   Tobacco Use  Smoking Status Former Smoker  . Packs/day: 1.50  . Years: 42.00  . Pack years: 63.00  . Types: Cigarettes  . Quit date: 07/13/2002  . Years since quitting: 18.2  Smokeless Tobacco Former Air traffic controller given: Not Answered   Outpatient Medications Prior to Visit  Medication Sig Dispense Refill  . allopurinol (ZYLOPRIM) 100 MG tablet TAKE 1 TABLET BY MOUTH DAILY 90 tablet 3  . aspirin EC 81 MG tablet Take 81 mg by mouth daily.    [name]  atorvastatin (LIPITOR) 10 MG tablet TAKE 1 TABLET(10 MG) BY MOUTH DAILY AT 6 PM 90 tablet 1  . cetirizine (ZYRTEC) 10 MG chewable tablet Chew 10 mg by mouth daily.    . Coenzyme Q-10 100 MG capsule Take 100 mg by mouth 2 (two) times daily.    . colchicine 0.6 MG tablet Take 1 tablet (0.6 mg total) by mouth 2 (two) times daily as needed (for gout). PRN for Gout 6 tablet 1  . diltiazem (CARDIZEM CD) 180 MG 24 hr capsule TAKE 1 CAPSULE(180 MG) BY MOUTH DAILY 30 capsule 6  . furosemide (LASIX) 20 MG tablet Take 1 tablet (20 mg total) by mouth as needed. DO NOT TAKE HCTZ THE DAY YOU USE MEDICATION AS NEEDED FOR SWELLING OR SHORTNESS OF BREATH 30 tablet 6  . guaiFENesin (MUCINEX) 600 MG 12 hr tablet Take 600 mg by mouth daily.    . hydrochlorothiazide (MICROZIDE) 12.5 MG capsule TAKE 1 CAPSULE(12.5 MG) BY MOUTH DAILY AS NEEDED FOR SWELLING 90 capsule 1  . metFORMIN (GLUCOPHAGE) 850 MG tablet Take 1bid 180 tablet 3  . methocarbamol (ROBAXIN) 500 MG tablet TAKE 1 TABLET(500 MG) BY MOUTH EVERY 12 HOURS 180 tablet 1  . montelukast (SINGULAIR) 10 MG tablet TAKE 1 TABLET(10 MG) BY MOUTH AT BEDTIME 90 tablet 1  . Multiple Vitamin (MULTIVITAMIN) tablet Take 1 tablet by mouth daily.    [name] olmesartan (BENICAR) 20 MG tablet TAKE 1 TABLET(20 MG) BY MOUTH DAILY. 90 tablet 3  . omega-3 acid ethyl esters (LOVAZA) 1 g capsule Take 1 capsule (1 g total) by mouth daily. 90 capsule 3  . omeprazole (PRILOSEC) 40 MG capsule TAKE 1 CAPSULE(40 MG) BY MOUTH DAILY 90 capsule 2  . PROAIR RESPICLICK 101 (90 Base) MCG/ACT AEPB INHALE 2 PUFFS EVERY 4-6 HOURS AS NEEDED FOR COUGH/ WHEEZE 1 each 0  . ipratropium (ATROVENT) 0.06 % nasal spray Place 2 sprays into both nostrils in the morning and at bedtime. 15 mL 6  . BREO ELLIPTA 200-25 MCG/INH AEPB INHALE 1 PUFF INTO THE LUNGS DAILY (Patient not taking: No sig reported) 60 each 5  . gabapentin (NEURONTIN) 300 MG capsule TAKE 1 CAPSULE(300 MG) BY MOUTH TWICE DAILY 180 capsule 3   No  facility-administered medications prior to visit.     Review of Systems:   Constitutional:   No  weight loss, night sweats,  Fevers, chills, + fatigue, or  lassitude.  HEENT:   No headaches,  Difficulty swallowing,  Tooth/dental problems, or  Sore throat,                No sneezing, itching, ear ache, nasal congestion, post nasal drip,   CV:  No chest pain,  Orthopnea, PND, swelling in lower extremities, anasarca, dizziness, palpitations, syncope.  GI  No heartburn, indigestion, abdominal pain, nausea, vomiting, diarrhea, change in bowel habits, loss of appetite, bloody stools.   Resp: No shortness of breath with exertion or at rest.  No excess mucus, no productive cough,  No non-productive cough,  No coughing up of blood.  No change in color of mucus.  No wheezing.  No chest wall deformity  Skin: no rash or lesions.  GU: no dysuria, change in color of urine, no urgency or frequency.  No flank pain, no hematuria   MS:  No joint pain or swelling.  No decreased range of motion.  No back pain.    Physical Exam  BP 140/64 (BP Location: Left Arm, Cuff Size: Normal)   Pulse 71   Temp 97.9 F (36.6 C) (Temporal)   Ht 5\' 8"  (1.727 m)   Wt 243 lb 9.6 oz (110.5 kg)   SpO2 98%   BMI 37.04 kg/m   GEN: A/Ox3; pleasant , NAD, well nourished    HEENT:  [place]/AT,  , NOSE-clear, THROAT-clear, no lesions, no postnasal drip or exudate noted.   NECK:  Supple w/ fair ROM; no JVD; normal carotid impulses w/o bruits; no thyromegaly or nodules palpated; no lymphadenopathy.    RESP  Clear  P & A; w/o, wheezes/ rales/ or rhonchi. no accessory muscle use, no dullness to percussion  CARD:  RRR, no m/r/g, no peripheral edema, pulses intact, no cyanosis or clubbing.  GI:   Soft & nt; nml bowel sounds; no organomegaly or masses detected.   Musco: Warm bil, no deformities or joint swelling noted.   Neuro: alert, no focal deficits noted.    Skin: Warm, no lesions or rashes    Lab  Results:  CBC  Imaging: No results found.    PFT Results Latest Ref Rng & Units 03/13/2019  FVC-Pre L 1.83  FVC-Predicted Pre % 47  FVC-Post L 1.95  FVC-Predicted Post % 50  Pre FEV1/FVC % % 83  Post FEV1/FCV % % 87  FEV1-Pre L 1.52  FEV1-Predicted Pre % 54  FEV1-Post L 1.69  DLCO uncorrected ml/min/mmHg 14.80  DLCO UNC% % 63  DLVA Predicted % 105  TLC L 4.63  TLC % Predicted % 70  RV % Predicted % 110    No results found for: NITRICOXIDE      Assessment & Plan:   No problem-specific Assessment & Plan notes found for this encounter.     Rexene Edison, NP 10/02/2020

## 2020-10-02 NOTE — Patient Instructions (Addendum)
Keep up good work  Continue on CPAP At bedtime   Work on healthy weight loss.  CPAP download.  Do not drive if sleepy .  Saline nasal rinses Twice daily   Saline nasal gel At bedtime   Follow up in 1 year with Dr. Ander Slade or Zabdiel Dripps NP and As needed   Please contact office for sooner follow up if symptoms do not improve or worsen or seek emergency care

## 2020-10-03 NOTE — Assessment & Plan Note (Signed)
Continue on CPAP at bedtime.  CPAP download requested.  Patient education given  Plan  Patient Instructions  Keep up good work  Continue on CPAP At bedtime   Work on healthy weight loss.  CPAP download.  Do not drive if sleepy .  Saline nasal rinses Twice daily   Saline nasal gel At bedtime   Follow up in 1 year with Dr. Ander Slade or Zamarian Scarano NP and As needed   Please contact office for sooner follow up if symptoms do not improve or worsen or seek emergency care

## 2020-10-03 NOTE — Telephone Encounter (Signed)
PLEASE HAVE PRIMARY TO FILL MEDICATION - IT WAS PRESCRIBED  By  Myrla Halsted.  medication refused

## 2020-10-03 NOTE — Assessment & Plan Note (Signed)
Continue with trigger prevention.  Currently controlled.  Continue on current regimen

## 2020-10-07 ENCOUNTER — Other Ambulatory Visit: Payer: Self-pay

## 2020-10-07 ENCOUNTER — Encounter: Payer: Self-pay | Admitting: Nurse Practitioner

## 2020-10-07 ENCOUNTER — Ambulatory Visit (INDEPENDENT_AMBULATORY_CARE_PROVIDER_SITE_OTHER): Payer: Medicare Other | Admitting: Nurse Practitioner

## 2020-10-07 VITALS — BP 118/70 | HR 69 | Temp 97.5°F | Ht 67.0 in | Wt 246.0 lb

## 2020-10-07 DIAGNOSIS — N182 Chronic kidney disease, stage 2 (mild): Secondary | ICD-10-CM | POA: Diagnosis not present

## 2020-10-07 DIAGNOSIS — I10 Essential (primary) hypertension: Secondary | ICD-10-CM

## 2020-10-07 DIAGNOSIS — H43393 Other vitreous opacities, bilateral: Secondary | ICD-10-CM | POA: Diagnosis not present

## 2020-10-07 DIAGNOSIS — E782 Mixed hyperlipidemia: Secondary | ICD-10-CM | POA: Diagnosis not present

## 2020-10-07 DIAGNOSIS — E1142 Type 2 diabetes mellitus with diabetic polyneuropathy: Secondary | ICD-10-CM

## 2020-10-07 DIAGNOSIS — E790 Hyperuricemia without signs of inflammatory arthritis and tophaceous disease: Secondary | ICD-10-CM | POA: Diagnosis not present

## 2020-10-07 DIAGNOSIS — M1A9XX0 Chronic gout, unspecified, without tophus (tophi): Secondary | ICD-10-CM

## 2020-10-07 DIAGNOSIS — Z6838 Body mass index (BMI) 38.0-38.9, adult: Secondary | ICD-10-CM | POA: Diagnosis not present

## 2020-10-07 DIAGNOSIS — I272 Pulmonary hypertension, unspecified: Secondary | ICD-10-CM

## 2020-10-07 DIAGNOSIS — H2513 Age-related nuclear cataract, bilateral: Secondary | ICD-10-CM | POA: Diagnosis not present

## 2020-10-07 DIAGNOSIS — H524 Presbyopia: Secondary | ICD-10-CM | POA: Diagnosis not present

## 2020-10-07 DIAGNOSIS — M5416 Radiculopathy, lumbar region: Secondary | ICD-10-CM | POA: Diagnosis not present

## 2020-10-07 DIAGNOSIS — H52203 Unspecified astigmatism, bilateral: Secondary | ICD-10-CM | POA: Diagnosis not present

## 2020-10-07 DIAGNOSIS — H5203 Hypermetropia, bilateral: Secondary | ICD-10-CM | POA: Diagnosis not present

## 2020-10-07 DIAGNOSIS — H25043 Posterior subcapsular polar age-related cataract, bilateral: Secondary | ICD-10-CM | POA: Diagnosis not present

## 2020-10-07 LAB — HEPATIC FUNCTION PANEL
ALT: 21 U/L (ref 0–53)
AST: 18 U/L (ref 0–37)
Albumin: 4.6 g/dL (ref 3.5–5.2)
Alkaline Phosphatase: 65 U/L (ref 39–117)
Bilirubin, Direct: 0.1 mg/dL (ref 0.0–0.3)
Total Bilirubin: 0.6 mg/dL (ref 0.2–1.2)
Total Protein: 7.2 g/dL (ref 6.0–8.3)

## 2020-10-07 LAB — BASIC METABOLIC PANEL
BUN: 26 mg/dL — ABNORMAL HIGH (ref 6–23)
CO2: 31 mEq/L (ref 19–32)
Calcium: 9.9 mg/dL (ref 8.4–10.5)
Chloride: 99 mEq/L (ref 96–112)
Creatinine, Ser: 1.13 mg/dL (ref 0.40–1.50)
GFR: 63.31 mL/min (ref >60.00)
Glucose, Bld: 149 mg/dL — ABNORMAL HIGH (ref 70–99)
Potassium: 5.3 mEq/L — ABNORMAL HIGH (ref 3.5–5.1)
Sodium: 138 mEq/L (ref 135–145)

## 2020-10-07 LAB — LIPID PANEL
Cholesterol: 193 mg/dL (ref 0–200)
HDL: 38 mg/dL — ABNORMAL LOW (ref >39.00)
NonHDL: 155.34
Total CHOL/HDL Ratio: 5
Triglycerides: 242 mg/dL — ABNORMAL HIGH (ref 0.0–149.0)
VLDL: 48.4 mg/dL — ABNORMAL HIGH (ref 0.0–40.0)

## 2020-10-07 LAB — URIC ACID: Uric Acid, Serum: 6.6 mg/dL (ref 4.0–7.8)

## 2020-10-07 LAB — LDL CHOLESTEROL, DIRECT: Direct LDL: 117 mg/dL

## 2020-10-07 LAB — HEMOGLOBIN A1C: Hgb A1c MFr Bld: 6.7 % — ABNORMAL HIGH (ref 4.6–6.5)

## 2020-10-07 MED ORDER — ATORVASTATIN CALCIUM 10 MG PO TABS
ORAL_TABLET | ORAL | 3 refills | Status: DC
Start: 2020-10-07 — End: 2021-10-07

## 2020-10-07 MED ORDER — GABAPENTIN 300 MG PO CAPS
ORAL_CAPSULE | ORAL | 3 refills | Status: DC
Start: 2020-10-07 — End: 2021-04-09

## 2020-10-07 MED ORDER — OMEGA-3-ACID ETHYL ESTERS 1 G PO CAPS: 1.0000 g | ORAL_CAPSULE | Freq: Every day | ORAL | 3 refills | Status: DC

## 2020-10-07 MED ORDER — OLMESARTAN MEDOXOMIL 20 MG PO TABS
ORAL_TABLET | ORAL | 3 refills | Status: DC
Start: 2020-10-07 — End: 2021-02-05

## 2020-10-07 MED ORDER — ALLOPURINOL 100 MG PO TABS: 100.0000 mg | ORAL_TABLET | Freq: Every day | ORAL | 3 refills | Status: DC

## 2020-10-07 MED ORDER — HYDROCHLOROTHIAZIDE 12.5 MG PO CAPS: 12.5000 mg | ORAL_CAPSULE | Freq: Every day | ORAL | 1 refills | Status: DC

## 2020-10-07 MED ORDER — DILTIAZEM HCL ER COATED BEADS 180 MG PO CP24: ORAL_CAPSULE | ORAL | 3 refills | Status: DC

## 2020-10-07 MED ORDER — METFORMIN HCL 850 MG PO TABS: ORAL_TABLET | ORAL | 3 refills | Status: DC

## 2020-10-07 NOTE — Progress Notes (Signed)
Subjective:   Andres Taylor is a 75 y.o. male who presents for an Initial Medicare Annual Wellness Visit.  I connected with Andres Taylor today by telephone and verified that I am speaking with the correct person using two identifiers. Location patient: home Location provider: work Persons participating in the virtual visit: patient, Marine scientist.    I discussed the limitations, risks, security and privacy concerns of performing an evaluation and management service by telephone and the availability of in person appointments. I also discussed with the patient that there may be a patient responsible charge related to this service. The patient expressed understanding and verbally consented to this telephonic visit.    Interactive audio and video telecommunications were attempted between this provider and patient, however failed, due to patient having technical difficulties OR patient did not have access to video capability.  We continued and completed visit with audio only.  Some vital signs may be absent or patient reported.   Time Spent with patient on telephone encounter: 25 minutes   Review of Systems     Cardiac Risk Factors include: advanced age (>72men, >76 women);diabetes mellitus;dyslipidemia;obesity (BMI >30kg/m2)     Objective:    Today's Vitals   10/08/20 0945  Weight: 246 lb (111.6 kg)  Height: 5\' 7"  (1.702 m)   Body mass index is 38.53 kg/m.  Advanced Directives 10/08/2020 09/21/2019 08/07/2019 05/16/2019 08/30/2013 08/28/2013 04/20/2013  Does Patient Have a Medical Advance Directive? Yes (No Data) Yes No Patient has advance directive, copy not in chart Patient has advance directive, copy not in chart Patient has advance directive, copy not in chart  Type of Advance Directive [place];Living will - [place];Living will - Living will;Healthcare Power of Attorney Living will;Healthcare Power of [place]  Does  patient want to make changes to medical advance directive? - - No - Patient declined - No change requested No change requested -  Copy of [place] in Chart? No - copy requested - No - copy requested - Copy requested from family Copy requested from family -  Pre-existing out of facility DNR order (yellow form or pink MOST form) - - - - No - -    Current Medications (verified) Outpatient Encounter Medications as of 10/08/2020  Medication Sig  . allopurinol (ZYLOPRIM) 100 MG tablet Take 1 tablet (100 mg total) by mouth daily.  [name] aspirin EC 81 MG tablet Take 81 mg by mouth daily.  [name] atorvastatin (LIPITOR) 10 MG tablet TAKE 1 TABLET(10 MG) BY MOUTH DAILY AT 6 PM  . BREO ELLIPTA 200-25 MCG/INH AEPB INHALE 1 PUFF INTO THE LUNGS DAILY  . cetirizine (ZYRTEC) 10 MG chewable tablet Chew 10 mg by mouth daily.  . Coenzyme Q-10 100 MG capsule Take 100 mg by mouth 2 (two) times daily.  . colchicine 0.6 MG tablet Take 1 tablet (0.6 mg total) by mouth 2 (two) times daily as needed (for gout). PRN for Gout  . diltiazem (CARDIZEM CD) 180 MG 24 hr capsule TAKE 1 CAPSULE(180 MG) BY MOUTH DAILY  . furosemide (LASIX) 20 MG tablet Take 1 tablet (20 mg total) by mouth as needed. DO NOT TAKE HCTZ THE DAY YOU USE MEDICATION AS NEEDED FOR SWELLING OR SHORTNESS OF BREATH  . gabapentin (NEURONTIN) 300 MG capsule TAKE 1 CAPSULE(300 MG) BY MOUTH TWICE DAILY  . guaiFENesin (MUCINEX) 600 MG 12 hr tablet Take 600 mg by mouth daily.  . hydrochlorothiazide (MICROZIDE) 12.5 MG capsule  Take 1 capsule (12.5 mg total) by mouth daily.  [name] ipratropium (ATROVENT) 0.06 % nasal spray Place 2 sprays into both nostrils in the morning and at bedtime.  . metFORMIN (GLUCOPHAGE) 850 MG tablet Take 1bid  . methocarbamol (ROBAXIN) 500 MG tablet TAKE 1 TABLET(500 MG) BY MOUTH EVERY 12 HOURS  . montelukast (SINGULAIR) 10 MG tablet TAKE 1 TABLET(10 MG) BY MOUTH AT BEDTIME  . Multiple Vitamin (MULTIVITAMIN) tablet Take 1 tablet by  mouth daily.  [name] olmesartan (BENICAR) 20 MG tablet TAKE 1 TABLET(20 MG) BY MOUTH DAILY.  [name] omega-3 acid ethyl esters (LOVAZA) 1 g capsule Take 1 capsule (1 g total) by mouth daily.  [name] omeprazole (PRILOSEC) 40 MG capsule TAKE 1 CAPSULE(40 MG) BY MOUTH DAILY  . PROAIR RESPICLICK 063 (90 Base) MCG/ACT AEPB INHALE 2 PUFFS EVERY 4-6 HOURS AS NEEDED FOR COUGH/ WHEEZE   No facility-administered encounter medications on file as of 10/08/2020.    Allergies (verified) Patient has no known allergies.   History: Past Medical History:  Diagnosis Date  . Abdominal bloating 03/13/2019  . Allergic rhinitis   . Allergy   . Arthritis   . Asthma   . Blood in urine   . Cataract    forming   . Chronic cough   . CKD (chronic kidney disease), stage II   . DDD (degenerative disc disease)   . Diabetes mellitus ([place])   . DOE (dyspnea on exertion)   . Eustachian tube dysfunction   . Fatty liver   . GERD (gastroesophageal reflux disease)   . Glaucoma   . Gout   . Hard of hearing    bilateral   . Heme positive stool 03/13/2019  . History of cardiomegaly 05/16/2019   Noted on CXR  . History of kidney stones   . Hyperlipidemia   . Hypertension   . Inguinal hernia    Bilateral  . Joint pain, knee   . Leg edema   . Lung tumor (benign), right    Right upper lobe lung mass  . Obesity   . OSA on CPAP   . Pneumonitis    right lung  . Pulmonary HTN ([place]) 04/18/2013  . Restrictive lung disease   . Sciatica   . Spinal stenosis   . Wears contact lenses   . Wears glasses   . Wears hearing aid in both ears    Past Surgical History:  Procedure Laterality Date  . APPENDECTOMY  1976  . COLONOSCOPY  08/2010  . COLONOSCOPY  08/07/2019  . [place]  2001  . [place]   as a baby 32 months old  . INGUINAL HERNIA REPAIR Bilateral 09/21/2019   Procedure: LAPAROSCOPIC BILATERAL INGUINAL HERNIA REPAIR, LYSIS OF ADHESIONS;  Surgeon: Michael Boston, MD;  Location: [facility];  Service: General;  Laterality: Bilateral;  . LEFT AND RIGHT HEART CATHETERIZATION WITH CORONARY ANGIOGRAM N/A 04/20/2013   Procedure: LEFT AND RIGHT HEART CATHETERIZATION WITH CORONARY ANGIOGRAM;  Surgeon: Leonie Man, MD;  Location: [facility] CATH LAB;  Angiographically normal Coronary Arteries - L Dom Cx (OM1, small LPL1 then LPL2 & PDA) & wraparound LAD (D1-3 small).  Borderline CO/CI - 4.94/2.16 by both Fick & TD.  RAP ~14 mmHg, RVEDP 15 mmHg.  LVEDP 19 mmHg (->diastolic dysfunction) PCWP 16 mmHg, but PA Mean 25 mmHg  . LITHOTRIPSY     x 2  . LOBECTOMY Right   . lumbar disectomy L4-5  1980  . NM  MYOVIEW LTD  06/15/2019    EF 70%.  No ischemia or infarction.  LOW RISK   . TRANSTHORACIC ECHOCARDIOGRAM  04/21/2019   EF 60 to 65%.  Mild LVH.  Normal RV.  Normal atrial size.  Mild aortic sclerosis.  No stenosis. Lipomatous intra-atrial septum  . UPPER GI ENDOSCOPY  08/07/2019  . VIDEO ASSISTED THORACOSCOPY (VATS)/WEDGE RESECTION Right 08/30/2013   Procedure: VIDEO ASSISTED THORACOSCOPY (VATS)/WEDGE RESECTION;  Surgeon: Melrose Nakayama, MD;  Location: [facility];  Service: Thoracic;  Laterality: Right;   Family History  Problem Relation Age of Onset  . Leukemia Father   . Breast cancer Maternal Grandmother   . Cancer Maternal Grandmother        breast cancer  . Stroke Maternal Grandmother   . Hypertension Maternal Grandfather   . Stroke Maternal Grandfather   . Stroke Mother   . Cancer Son        Ewing's sarcoma  . Heart failure Son        Related to Adriamycin toxicity.  Had 2 consecutive heart transplants  . AAA (abdominal aortic aneurysm) Son   . Stroke Paternal Grandmother   . Colon cancer Neg Hx   . Esophageal cancer Neg Hx   . Colon polyps Neg Hx   . Rectal cancer Neg Hx   . Stomach cancer Neg Hx    Social History   Socioeconomic History  . Marital status: Married    Spouse name: Not on file  . Number of children: 3  . Years of education: Not on file  .  Highest education level: High school graduate  Occupational History  . Occupation: Real Air traffic controller: [name]  Tobacco Use  . Smoking status: Former Smoker    Packs/day: 1.50    Years: 42.00    Pack years: 63.00    Types: Cigarettes    Quit date: 07/13/2002    Years since quitting: 18.2  . Smokeless tobacco: Former Network engineer  . Vaping Use: Never used  Substance and Sexual Activity  . Alcohol use: Yes    Alcohol/week: 3.0 standard drinks    Types: 3 Cans of beer per week    Comment: 3 tmes weekly  . Drug use: No  . Sexual activity: Not on file  Other Topics Concern  . Not on file  Social History Narrative   He and his first wife divorced in 53 14/2015.  He is now remarried and lives with his new wife.   He has 2 adult children-aged 76 and 2 (he had 3 children, 1 of his sons--actually the twin brother of 40 of the existing sons, died after complications of redo heart transplant following Adriamycin toxicity from leukemia as a child, he died at age 57.     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   He quit smoking in 2004 after 50+ pack year smoking history.  He occasionally drinks alcohol.   Social Determinants of Health   Financial Resource Strain: Low Risk   . Difficulty of Paying Living Expenses: Not hard at all  Food Insecurity: No Food Insecurity  . Worried About Charity fundraiser in the Last Year: Never true  . Ran Out of Food in the Last Year: Never true  Transportation Needs: No Transportation Needs  . Lack of Transportation (Medical): No  . Lack of Transportation (Non-Medical): No  Physical Activity: Inactive  . Days of Exercise per Week: 0  days  . Minutes of Exercise per Session: 0 min  Stress: No Stress Concern Present  . Feeling of Stress : Not at all  Social Connections: Socially Integrated  . Frequency of Communication with Friends and Family: More than three times a week  . Frequency of Social Gatherings  with Friends and Family: More than three times a week  . Attends Religious Services: 1 to 4 times per year  . Active Member of Clubs or Organizations: Yes  . Attends Archivist Meetings: 1 to 4 times per year  . Marital Status: Married    Tobacco Counseling Counseling given: Not Answered   Clinical Intake:  Pre-visit preparation completed: Yes  Pain : No/denies pain     Nutritional Status: BMI > 30  Obese Nutritional Risks: None Diabetes: Yes CBG done?: No Did pt. bring in CBG monitor from home?: No (phone visit)  How often do you need to have someone help you when you read instructions, pamphlets, or other written materials from your doctor or pharmacy?: 1 - Never  Diabetes:  Is the patient diabetic?  Yes  If diabetic, was a CBG obtained today?  No  Did the patient bring in their glucometer from home?  No  How often do you monitor your CBG's? occasionally.   Financial Strains and Diabetes Management:  Are you having any financial strains with the device, your supplies or your medication? No .  Does the patient want to be seen by Chronic Care Management for management of their diabetes?  No  Would the patient like to be referred to a Nutritionist or for Diabetic Management?  No   Diabetic Exams:  Diabetic Eye Exam: Completed 09/2020. Awaiting notes fom Dr. Trinna Post  Diabetic Foot Exam: Completed 10/07/2020.    Interpreter Needed?: No  Information entered by :: Caroleen Hamman LPN   Activities of Daily Living In your present state of health, do you have any difficulty performing the following activities: 10/08/2020  Hearing? Y  Comment hearing aids  Vision? N  Difficulty concentrating or making decisions? N  Walking or climbing stairs? Y  Comment uses a walker  Dressing or bathing? N  Doing errands, shopping? N  Preparing Food and eating ? N  Using the Toilet? N  In the past six months, have you accidently leaked urine? N  Do you have problems with  loss of bowel control? N  Managing your Medications? N  Managing your Finances? N  Housekeeping or managing your Housekeeping? N  Some recent data might be hidden    Patient Care Team: Nche, Charlene Brooke, NP as PCP - General (Internal Medicine) Leonie Man, MD as PCP - Cardiology (Cardiology) Leonie Man, MD as Consulting Physician (Cardiology) Michael Boston, MD as Consulting Physician (General Surgery) Melrose Nakayama, MD as Consulting Physician (Cardiothoracic Surgery) Parrett, Fonnie Mu, NP as Nurse Practitioner (Pulmonary Disease) Germaine Pomfret, [facility] as Pharmacist (Pharmacist)  Indicate any recent Medical Services you may have received from other than [facility] providers in the past year (date may be approximate).     Assessment:   This is a routine wellness examination for [facility].  Hearing/Vision screen  Hearing Screening   125Hz  250Hz  500Hz  1000Hz  2000Hz  3000Hz  4000Hz  6000Hz  8000Hz   Right ear:           Left ear:           Comments: Wears hearing aids  Vision Screening Comments: Wears contacts Last eye exam-10/07/2020-Dr. Trinna Post  Dietary issues and  exercise activities discussed: Current Exercise Habits: The patient does not participate in regular exercise at present, Exercise limited by: orthopedic condition(s)  Goals    . Chronic Care Management     CARE PLAN ENTRY  Current Barriers:  . Chronic Disease Management support, education, and care coordination needs related to hypertension, asthma, type 2 diabetes, hyperlipidemia, gout  Clinical Goal(s): Over the next 180 days, patient will:  . Work with the care management team to address educational, disease management, and care coordination needs  . Begin or continue self health monitoring activities as directed today  . Call provider office for new or worsened signs and symptoms  . Call care management team with questions or concerns . Maintain blood pressure less than 140/80 . Maintain A1c less  than 7%  . Maintain LDL (Bad cholesterol) less than 70    Interventions: . Evaluation of current treatment plans and patient's adherence to plan as established by provider . Assessed patient understanding of disease states . Assessed patient's education and care coordination needs . Provided disease specific education to patient   Telephone follow up appointment with pharmacy team member scheduled for: 04/15/20 at 2:00 PM     . Patient Stated     Increase activity as tolerated      Depression Screen PHQ 2/9 Scores 10/08/2020 01/02/2020 12/06/2018 06/06/2018 05/09/2018  PHQ - 2 Score 0 0 0 0 0    Fall Risk Fall Risk  10/08/2020 01/02/2020 07/04/2019 12/06/2018 06/06/2018  Falls in the past year? 0 0 0 0 0  Number falls in past yr: 0 0 - - -  Injury with Fall? 0 0 - - -  Follow up Falls prevention discussed - - - -    FALL RISK PREVENTION PERTAINING TO THE HOME:  Any stairs in or around the home? No  Home free of loose throw rugs in walkways, pet beds, electrical cords, etc? Yes  Adequate lighting in your home to reduce risk of falls? Yes   ASSISTIVE DEVICES UTILIZED TO PREVENT FALLS:  Life alert? No  Use of a cane, walker or w/c? Yes  Grab bars in the bathroom? Yes  Shower chair or bench in shower? No  Elevated toilet seat or a handicapped toilet? No   TIMED UP AND GO:  Was the test performed? No .phone visit    Cognitive Function:Normal cognitive status assessed by  this Nurse Health Advisor. No abnormalities found.          Immunizations Immunization History  Administered Date(s) Administered  . Fluad Quad(high Dose 65+) 07/03/2019  . Influenza Whole 05/10/2007, 07/08/2010  . Influenza,inj,Quad PF,6+ Mos 04/17/2018  . Influenza-Unspecified 07/14/2011, 06/29/2013, 03/30/2014, 04/09/2015, 05/19/2016  . PFIZER(Purple Top)SARS-COV-2 Vaccination 08/05/2019, 08/27/2019, 04/03/2020  . Pneumococcal Conjugate-13 01/05/2012, 06/29/2013, 03/13/2014  . Pneumococcal  Polysaccharide-23 02/15/2013, 04/09/2015  . Td 02/15/2013  . Tdap 07/08/2010, 07/13/2013  . Zoster 07/21/2010, 02/15/2013    TDAP status: Up to date  Flu Vaccine status: Declined, Education has been provided regarding the importance of this vaccine but patient still declined. Advised may receive this vaccine at local pharmacy or Health Dept. Aware to provide a copy of the vaccination record if obtained from local pharmacy or Health Dept. Verbalized acceptance and understanding.  Pneumococcal vaccine status: Up to date  Covid-19 vaccine status: Completed vaccines  Qualifies for Shingles Vaccine? Yes   Zostavax completed Yes   Shingrix Completed?: No.    Education has been provided regarding the importance of this vaccine. Patient has been  advised to call insurance company to determine out of pocket expense if they have not yet received this vaccine. Advised may also receive vaccine at local pharmacy or Health Dept. Verbalized acceptance and understanding.  Screening Tests Health Maintenance  Topic Date Due  . OPHTHALMOLOGY EXAM  06/22/2020  . INFLUENZA VACCINE  10/10/2020 (Originally 02/11/2020)  . HEMOGLOBIN A1C  04/09/2021  . FOOT EXAM  10/07/2021  . COLONOSCOPY (Pts 45-34yrs Insurance coverage will need to be confirmed)  08/06/2022  . TETANUS/TDAP  07/14/2023  . COVID-19 Vaccine  Completed  . Hepatitis C Screening  Completed  . PNA vac Low Risk Adult  Completed  . HPV VACCINES  Aged Out    Health Maintenance  Health Maintenance Due  Topic Date Due  . OPHTHALMOLOGY EXAM  06/22/2020    Colorectal cancer screening: No longer required.   Lung Cancer Screening: (Low Dose CT Chest recommended if Age 79-80 years, 30 pack-year currently smoking OR have quit w/in 15years.) does not qualify.    Additional Screening:  Hepatitis C Screening:Completed 06/06/2018  Vision Screening: Recommended annual ophthalmology exams for early detection of glaucoma and other disorders of the  eye. Is the patient up to date with their annual eye exam?  Yes  Who is the provider or what is the name of the office in which the patient attends annual eye exams? Dr. Trinna Post   Dental Screening: Recommended annual dental exams for proper oral hygiene  Community Resource Referral / Chronic Care Management: CRR required this visit?  No   CCM required this visit?  No      Plan:     I have personally reviewed and noted the following in the patient's chart:   . Medical and social history . Use of alcohol, tobacco or illicit drugs  . Current medications and supplements . Functional ability and status . Nutritional status . Physical activity . Advanced directives . List of other physicians . Hospitalizations, surgeries, and ER visits in previous 12 months . Vitals . Screenings to include cognitive, depression, and falls . Referrals and appointments  In addition, I have reviewed and discussed with patient certain preventive protocols, quality metrics, and best practice recommendations. A written personalized care plan for preventive services as well as general preventive health recommendations were provided to patient.   Due to this being a telephonic visit, the after visit summary with patients personalized plan was offered to patient via mail or my-chart. Patient would like to access on my-chart.   Marta Antu, LPN   [id number]  Nurse Health Advisor  Nurse Notes: None

## 2020-10-07 NOTE — Progress Notes (Signed)
Subjective:  Patient ID: Andres Taylor, male    DOB: 1945-08-26  Age: 75 y.o. MRN: [id number]  CC: Follow-up (6 month f/u on HTN, DM, and hyperlipidemia. Medication refills needed. )  HPI  Pulmonary HTN ([place]) Previous documentation per Dr. Ellyn Hack, cardiology "Echocardiogram result: Normal pump function with an ejection fraction in the normal range of 60 to 65%. Mildly thickened left ventricle wall but normal motion. (Normal relaxation for age). Available data suggests mildly elevated pulmonary pressures. I would not call this pulmonary hypertension. Would not explain shortness of breath. Aortic valve has some mild calcification, but has no evidence of narrowing. Other valves are normal."  HTN (hypertension), benign BP at goal Compliant with medications, diet and use of CPAP machine Uses furosemide only if LE edema and dose not combine with HCTZ BP Readings from Last 3 Encounters:  10/07/20 118/70  10/02/20 140/64  05/07/20 128/66   Repeat BMP Maintain diltiazem, HCTZ, benicar dose F/up in 25months  DM (diabetes mellitus), type 2 ([place]) Controlled with metformin Repeat hgbA1c: controlled Normal foot exam today Has annual DM eye appt today. Stable renal function Refilled metformin  CKD (chronic kidney disease) stage 2, GFR 60-89 ml/min Repeat BMP: stable BP and glucose at goal BMP Latest Ref Rng & Units 10/07/2020 01/02/2020 09/21/2019  Glucose 70 - 99 mg/dL 149(H) 100(H) 118(H)  BUN 6 - 23 mg/dL 26(H) 14 21  Creatinine 0.40 - 1.50 mg/dL 1.13 0.97 1.20  BUN/Creat Ratio 10 - 24 - - -  Sodium 135 - 145 mEq/L 138 138 138  Potassium 3.5 - 5.1 mEq/L 5.3(H) 4.4 3.8  Chloride 96 - 112 mEq/L 99 102 99  CO2 19 - 32 mEq/L 31 31 -  Calcium 8.4 - 10.5 mg/dL 9.9 9.6 -    Hyperuricemia No acute gout attack in last 60months Repeat BMP and uric acid level: stable Continue current dose of allopurinol  Class 2 severe obesity due to excess calories with serious comorbidity and  body mass index (BMI) of 38.0 to 38.9 in adult [facility]) States he is limited activity due to chronic back and ankle pain, has SOB with exertion secondary to restrictive lung disease, and has difficulty implementing regular exercise regimen due to his work schedule.  Advised to implement regular exercise regimen (2-3mins sessions like recumbent bicycling and/or water aerobics or chair exercise 2-3x/week); to help improve his endurance and promote weight loss.   BP Readings from Last 3 Encounters:  10/07/20 118/70  10/02/20 140/64  05/07/20 128/66   Wt Readings from Last 3 Encounters:  10/07/20 246 lb (111.6 kg)  10/02/20 243 lb 9.6 oz (110.5 kg)  05/07/20 242 lb 3.2 oz (109.9 kg)   Reviewed past Medical, Social and Family history today.  Outpatient Medications Prior to Visit  Medication Sig Dispense Refill  . aspirin EC 81 MG tablet Take 81 mg by mouth daily.    [name] BREO ELLIPTA 200-25 MCG/INH AEPB INHALE 1 PUFF INTO THE LUNGS DAILY 60 each 5  . cetirizine (ZYRTEC) 10 MG chewable tablet Chew 10 mg by mouth daily.    . Coenzyme Q-10 100 MG capsule Take 100 mg by mouth 2 (two) times daily.    . colchicine 0.6 MG tablet Take 1 tablet (0.6 mg total) by mouth 2 (two) times daily as needed (for gout). PRN for Gout 6 tablet 1  . furosemide (LASIX) 20 MG tablet Take 1 tablet (20 mg total) by mouth as needed. DO NOT TAKE HCTZ THE DAY YOU USE MEDICATION  AS NEEDED FOR SWELLING OR SHORTNESS OF BREATH 30 tablet 6  . guaiFENesin (MUCINEX) 600 MG 12 hr tablet Take 600 mg by mouth daily.    [name] ipratropium (ATROVENT) 0.06 % nasal spray Place 2 sprays into both nostrils in the morning and at bedtime. 30 each 6  . methocarbamol (ROBAXIN) 500 MG tablet TAKE 1 TABLET(500 MG) BY MOUTH EVERY 12 HOURS 180 tablet 1  . montelukast (SINGULAIR) 10 MG tablet TAKE 1 TABLET(10 MG) BY MOUTH AT BEDTIME 90 tablet 1  . Multiple Vitamin (MULTIVITAMIN) tablet Take 1 tablet by mouth daily.    [name] omeprazole (PRILOSEC) 40 MG  capsule TAKE 1 CAPSULE(40 MG) BY MOUTH DAILY 90 capsule 2  . PROAIR RESPICLICK 774 (90 Base) MCG/ACT AEPB INHALE 2 PUFFS EVERY 4-6 HOURS AS NEEDED FOR COUGH/ WHEEZE 1 each 0  . allopurinol (ZYLOPRIM) 100 MG tablet TAKE 1 TABLET BY MOUTH DAILY 90 tablet 3  . atorvastatin (LIPITOR) 10 MG tablet TAKE 1 TABLET(10 MG) BY MOUTH DAILY AT 6 PM 90 tablet 1  . diltiazem (CARDIZEM CD) 180 MG 24 hr capsule TAKE 1 CAPSULE(180 MG) BY MOUTH DAILY 30 capsule 6  . hydrochlorothiazide (MICROZIDE) 12.5 MG capsule TAKE 1 CAPSULE(12.5 MG) BY MOUTH DAILY AS NEEDED FOR SWELLING 90 capsule 1  . metFORMIN (GLUCOPHAGE) 850 MG tablet Take 1bid 180 tablet 3  . olmesartan (BENICAR) 20 MG tablet TAKE 1 TABLET(20 MG) BY MOUTH DAILY. 90 tablet 3  . omega-3 acid ethyl esters (LOVAZA) 1 g capsule Take 1 capsule (1 g total) by mouth daily. 90 capsule 3  . gabapentin (NEURONTIN) 300 MG capsule TAKE 1 CAPSULE(300 MG) BY MOUTH TWICE DAILY (Patient not taking: Reported on 10/07/2020) 180 capsule 3   No facility-administered medications prior to visit.    ROS See HPI  Objective:  BP 118/70 (BP Location: Left Arm, Patient Position: Sitting, Cuff Size: Large)   Pulse 69   Temp (!) 97.5 F (36.4 C) (Temporal)   Ht 5\' 7"  (1.702 m)   Wt 246 lb (111.6 kg)   SpO2 97%   BMI 38.53 kg/m   Physical Exam Vitals reviewed.  Constitutional:      Appearance: He is obese.  Cardiovascular:     Rate and Rhythm: Normal rate and regular rhythm.     Pulses: Normal pulses.          Dorsalis pedis pulses are 2+ on the right side and 2+ on the left side.       Posterior tibial pulses are 2+ on the right side and 2+ on the left side.     Heart sounds: Normal heart sounds.  Pulmonary:     Effort: Pulmonary effort is normal.     Breath sounds: Normal breath sounds.  Musculoskeletal:     Cervical back: Normal range of motion and neck supple.     Right lower leg: No edema.     Left lower leg: No edema.     Right foot: Normal range of motion.  No bunion or prominent metatarsal heads.     Left foot: Normal range of motion. No bunion or prominent metatarsal heads.  Feet:     Right foot:     Protective Sensation: 10 sites tested. 10 sites sensed.     Skin integrity: Skin integrity normal.     Toenail Condition: Right toenails are normal.     Left foot:     Protective Sensation: 10 sites tested. 10 sites sensed.     Skin  integrity: Skin integrity normal.     Toenail Condition: Left toenails are normal.  Skin:    General: Skin is warm and dry.  Neurological:     Mental Status: He is alert and oriented to person, place, and time.  Psychiatric:        Mood and Affect: Mood normal.        Behavior: Behavior normal.        Thought Content: Thought content normal.    Assessment & Plan:  This visit occurred during the SARS-CoV-2 public health emergency.  Safety protocols were in place, including screening questions prior to the visit, additional usage of staff PPE, and extensive cleaning of exam room while observing appropriate contact time as indicated for disinfecting solutions.   Mearl was seen today for follow-up.  Diagnoses and all orders for this visit:  HTN (hypertension), benign -     Basic metabolic panel -     diltiazem (CARDIZEM CD) 180 MG 24 hr capsule; TAKE 1 CAPSULE(180 MG) BY MOUTH DAILY -     hydrochlorothiazide (MICROZIDE) 12.5 MG capsule; Take 1 capsule (12.5 mg total) by mouth daily. -     olmesartan (BENICAR) 20 MG tablet; TAKE 1 TABLET(20 MG) BY MOUTH DAILY.  Type 2 diabetes mellitus with diabetic polyneuropathy, without long-term current use of insulin (HCC) -     Hepatic function panel -     Hemoglobin A1c -     metFORMIN (GLUCOPHAGE) 850 MG tablet; Take 1bid  Mixed hyperlipidemia -     Hepatic function panel -     Lipid panel -     LDL cholesterol, direct -     atorvastatin (LIPITOR) 10 MG tablet; TAKE 1 TABLET(10 MG) BY MOUTH DAILY AT 6 PM -     omega-3 acid ethyl esters (LOVAZA) 1 g capsule; Take  1 capsule (1 g total) by mouth daily.  Class 2 severe obesity due to excess calories with serious comorbidity and body mass index (BMI) of 38.0 to 38.9 in adult [facility])  CKD (chronic kidney disease) stage 2, GFR 60-89 ml/min -     Basic metabolic panel  Pulmonary HTN (HCC)  Hyperuricemia -     Uric acid  Chronic gout without tophus, unspecified cause, unspecified site -     allopurinol (ZYLOPRIM) 100 MG tablet; Take 1 tablet (100 mg total) by mouth daily.  Diabetic polyneuropathy associated with type 2 diabetes mellitus (HCC) -     gabapentin (NEURONTIN) 300 MG capsule; TAKE 1 CAPSULE(300 MG) BY MOUTH TWICE DAILY  Lumbar radiculopathy, right -     gabapentin (NEURONTIN) 300 MG capsule; TAKE 1 CAPSULE(300 MG) BY MOUTH TWICE DAILY  Stable uric acid, renal function, glucose control and liver function Abnormal liver function due to elevated triglycerides: low carb/low fat diet and exercise as discussed with be of additional benefit to improvement these numbers. Maintain current medications F/up in 29months (fasting)  Problem List Items Addressed This Visit      Cardiovascular and Mediastinum   HTN (hypertension), benign - Primary (Chronic)    BP at goal Compliant with medications, diet and use of CPAP machine Uses furosemide only if LE edema and dose not combine with HCTZ BP Readings from Last 3 Encounters:  10/07/20 118/70  10/02/20 140/64  05/07/20 128/66   Repeat BMP Maintain diltiazem, HCTZ, benicar dose F/up in 50months      Relevant Medications   diltiazem (CARDIZEM CD) 180 MG 24 hr capsule   atorvastatin (LIPITOR) 10 MG tablet  hydrochlorothiazide (MICROZIDE) 12.5 MG capsule   olmesartan (BENICAR) 20 MG tablet   omega-3 acid ethyl esters (LOVAZA) 1 g capsule   Other Relevant Orders   Basic metabolic panel (Completed)   RESOLVED: Pulmonary HTN (HCC) (Chronic)    Previous documentation per Dr. Ellyn Hack, cardiology "Echocardiogram result: Normal pump function with an  ejection fraction in the normal range of 60 to 65%. Mildly thickened left ventricle wall but normal motion. (Normal relaxation for age). Available data suggests mildly elevated pulmonary pressures. I would not call this pulmonary hypertension. Would not explain shortness of breath. Aortic valve has some mild calcification, but has no evidence of narrowing. Other valves are normal."      Relevant Medications   diltiazem (CARDIZEM CD) 180 MG 24 hr capsule   atorvastatin (LIPITOR) 10 MG tablet   hydrochlorothiazide (MICROZIDE) 12.5 MG capsule   olmesartan (BENICAR) 20 MG tablet   omega-3 acid ethyl esters (LOVAZA) 1 g capsule     Endocrine   Diabetic neuropathy associated with type 2 diabetes mellitus (HCC)   Relevant Medications   atorvastatin (LIPITOR) 10 MG tablet   metFORMIN (GLUCOPHAGE) 850 MG tablet   gabapentin (NEURONTIN) 300 MG capsule   olmesartan (BENICAR) 20 MG tablet   DM (diabetes mellitus), type 2 ([place])    Controlled with metformin Repeat hgbA1c: controlled Normal foot exam today Has annual DM eye appt today. Stable renal function Refilled metformin      Relevant Medications   atorvastatin (LIPITOR) 10 MG tablet   metFORMIN (GLUCOPHAGE) 850 MG tablet   olmesartan (BENICAR) 20 MG tablet   Other Relevant Orders   Hepatic function panel (Completed)   Hemoglobin A1c (Completed)     Nervous and Auditory   Lumbar radiculopathy, right   Relevant Medications   gabapentin (NEURONTIN) 300 MG capsule     Genitourinary   CKD (chronic kidney disease) stage 2, GFR 60-89 ml/min    Repeat BMP: stable BP and glucose at goal BMP Latest Ref Rng & Units 10/07/2020 01/02/2020 09/21/2019  Glucose 70 - 99 mg/dL 149(H) 100(H) 118(H)  BUN 6 - 23 mg/dL 26(H) 14 21  Creatinine 0.40 - 1.50 mg/dL 1.13 0.97 1.20  BUN/Creat Ratio 10 - 24 - - -  Sodium 135 - 145 mEq/L 138 138 138  Potassium 3.5 - 5.1 mEq/L 5.3(H) 4.4 3.8  Chloride 96 - 112 mEq/L 99 102 99  CO2 19 - 32 mEq/L 31 31 -   Calcium 8.4 - 10.5 mg/dL 9.9 9.6 -        Relevant Orders   Basic metabolic panel (Completed)     Other   Class 2 severe obesity due to excess calories with serious comorbidity and body mass index (BMI) of 38.0 to 38.9 in adult [facility])    States he is limited activity due to chronic back and ankle pain, has SOB with exertion secondary to restrictive lung disease, and has difficulty implementing regular exercise regimen due to his work schedule.  Advised to implement regular exercise regimen (2-60mins sessions like recumbent bicycling and/or water aerobics or chair exercise 2-3x/week); to help improve his endurance and promote weight loss.       Relevant Medications   metFORMIN (GLUCOPHAGE) 850 MG tablet   Hyperuricemia    No acute gout attack in last 69months Repeat BMP and uric acid level: stable Continue current dose of allopurinol      Relevant Medications   allopurinol (ZYLOPRIM) 100 MG tablet   Other Relevant Orders   Uric acid (  Completed)   Mixed hyperlipidemia (Chronic)   Relevant Medications   diltiazem (CARDIZEM CD) 180 MG 24 hr capsule   atorvastatin (LIPITOR) 10 MG tablet   hydrochlorothiazide (MICROZIDE) 12.5 MG capsule   olmesartan (BENICAR) 20 MG tablet   omega-3 acid ethyl esters (LOVAZA) 1 g capsule   Other Relevant Orders   Hepatic function panel (Completed)   Lipid panel (Completed)   LDL cholesterol, direct (Completed)    Other Visit Diagnoses    Chronic gout without tophus, unspecified cause, unspecified site       Relevant Medications   allopurinol (ZYLOPRIM) 100 MG tablet      Follow-up: Return in about 6 months (around 04/09/2021) for DM and HTN, hyperlipidemia (fasting).  Wilfred Lacy, NP

## 2020-10-07 NOTE — Patient Instructions (Signed)
Maintain current medications Go to lab for blood draw Have eye exam report faxed to me  It will be beneficial to maintain regular exercise regimen like recumbent bicycling and/or water aerobics or chair exercise 2-3x/week.

## 2020-10-07 NOTE — Assessment & Plan Note (Addendum)
Controlled with metformin Repeat hgbA1c: controlled Normal foot exam today Has annual DM eye appt today. Stable renal function Refilled metformin

## 2020-10-07 NOTE — Assessment & Plan Note (Addendum)
No acute gout attack in last 68months Repeat BMP and uric acid level: stable Continue current dose of allopurinol

## 2020-10-07 NOTE — Assessment & Plan Note (Addendum)
States he is limited activity due to chronic back and ankle pain, has SOB with exertion secondary to restrictive lung disease, and has difficulty implementing regular exercise regimen due to his work schedule.  Advised to implement regular exercise regimen (2-84mins sessions like recumbent bicycling and/or water aerobics or chair exercise 2-3x/week); to help improve his endurance and promote weight loss.

## 2020-10-07 NOTE — Assessment & Plan Note (Addendum)
BP at goal Compliant with medications, diet and use of CPAP machine Uses furosemide only if LE edema and dose not combine with HCTZ BP Readings from Last 3 Encounters:  10/07/20 118/70  10/02/20 140/64  05/07/20 128/66   Repeat BMP Maintain diltiazem, HCTZ, benicar dose F/up in 66months

## 2020-10-07 NOTE — Assessment & Plan Note (Addendum)
Repeat BMP: stable BP and glucose at goal BMP Latest Ref Rng & Units 10/07/2020 01/02/2020 09/21/2019  Glucose 70 - 99 mg/dL 149(H) 100(H) 118(H)  BUN 6 - 23 mg/dL 26(H) 14 21  Creatinine 0.40 - 1.50 mg/dL 1.13 0.97 1.20  BUN/Creat Ratio 10 - 24 - - -  Sodium 135 - 145 mEq/L 138 138 138  Potassium 3.5 - 5.1 mEq/L 5.3(H) 4.4 3.8  Chloride 96 - 112 mEq/L 99 102 99  CO2 19 - 32 mEq/L 31 31 -  Calcium 8.4 - 10.5 mg/dL 9.9 9.6 -

## 2020-10-07 NOTE — Assessment & Plan Note (Signed)
Previous documentation per Dr. Ellyn Hack, cardiology "Echocardiogram result: Normal pump function with an ejection fraction in the normal range of 60 to 65%. Mildly thickened left ventricle wall but normal motion. (Normal relaxation for age). Available data suggests mildly elevated pulmonary pressures. I would not call this pulmonary hypertension. Would not explain shortness of breath. Aortic valve has some mild calcification, but has no evidence of narrowing. Other valves are normal."

## 2020-10-08 ENCOUNTER — Ambulatory Visit (INDEPENDENT_AMBULATORY_CARE_PROVIDER_SITE_OTHER): Payer: Medicare Other

## 2020-10-08 VITALS — Ht 67.0 in | Wt 246.0 lb

## 2020-10-08 DIAGNOSIS — Z0000 Encounter for general adult medical examination without abnormal findings: Secondary | ICD-10-CM

## 2020-10-08 NOTE — Patient Instructions (Signed)
Andres Taylor , Thank you for taking time to come for your Medicare Wellness Visit. I appreciate your ongoing commitment to your health goals. Please review the following plan we discussed and let me know if I can assist you in the future.   Screening recommendations/referrals: Colonoscopy: Per our conversation, please follow GI recommendations. Recommended yearly ophthalmology/optometry visit for glaucoma screening and checkup Recommended yearly dental visit for hygiene and checkup  Vaccinations: Influenza vaccine: Declined Pneumococcal vaccine: Completed vaccines Tdap vaccine: Up to date-Due-03/30/2024 Shingles vaccine: Discuss with pharmacy  Covid-19: Completed vaccines  Advanced directives: Please bring a copy for your chart  Conditions/risks identified: See problem list  Next appointment: Follow up in one year for your annual wellness visit. 10/14/2021 @ 9:45  Preventive Care 76 Years and Older, Male Preventive care refers to lifestyle choices and visits with your health care provider that can promote health and wellness. What does preventive care include?  A yearly physical exam. This is also called an annual well check.  Dental exams once or twice a year.  Routine eye exams. Ask your health care provider how often you should have your eyes checked.  Personal lifestyle choices, including:  Daily care of your teeth and gums.  Regular physical activity.  Eating a healthy diet.  Avoiding tobacco and drug use.  Limiting alcohol use.  Practicing safe sex.  Taking low doses of aspirin every day.  Taking vitamin and mineral supplements as recommended by your health care provider. What happens during an annual well check? The services and screenings done by your health care provider during your annual well check will depend on your age, overall health, lifestyle risk factors, and family history of disease. Counseling  Your health care provider may ask you questions about  your:  Alcohol use.  Tobacco use.  Drug use.  Emotional well-being.  Home and relationship well-being.  Sexual activity.  Eating habits.  History of falls.  Memory and ability to understand (cognition).  Work and work Statistician. Screening  You may have the following tests or measurements:  Height, weight, and BMI.  Blood pressure.  Lipid and cholesterol levels. These may be checked every 5 years, or more frequently if you are over 90 years old.  Skin check.  Lung cancer screening. You may have this screening every year starting at age 17 if you have a 30-pack-year history of smoking and currently smoke or have quit within the past 15 years.  Fecal occult blood test (FOBT) of the stool. You may have this test every year starting at age 10.  Flexible sigmoidoscopy or colonoscopy. You may have a sigmoidoscopy every 5 years or a colonoscopy every 10 years starting at age 23.  Prostate cancer screening. Recommendations will vary depending on your family history and other risks.  Hepatitis C blood test.  Hepatitis B blood test.  Sexually transmitted disease (STD) testing.  Diabetes screening. This is done by checking your blood sugar (glucose) after you have not eaten for a while (fasting). You may have this done every 1-3 years.  Abdominal aortic aneurysm (AAA) screening. You may need this if you are a current or former smoker.  Osteoporosis. You may be screened starting at age 55 if you are at high risk. Talk with your health care provider about your test results, treatment options, and if necessary, the need for more tests. Vaccines  Your health care provider may recommend certain vaccines, such as:  Influenza vaccine. This is recommended every year.  Tetanus, diphtheria,  and acellular pertussis (Tdap, Td) vaccine. You may need a Td booster every 10 years.  Zoster vaccine. You may need this after age 8.  Pneumococcal 13-valent conjugate (PCV13) vaccine.  One dose is recommended after age 33.  Pneumococcal polysaccharide (PPSV23) vaccine. One dose is recommended after age 43. Talk to your health care provider about which screenings and vaccines you need and how often you need them. This information is not intended to replace advice given to you by your health care provider. Make sure you discuss any questions you have with your health care provider. Document Released: 07/26/2015 Document Revised: 03/18/2016 Document Reviewed: 04/30/2015 Elsevier Interactive Patient Education  2017 [place] Prevention in the Home Falls can cause injuries. They can happen to people of all ages. There are many things you can do to make your home safe and to help prevent falls. What can I do on the outside of my home?  Regularly fix the edges of walkways and driveways and fix any cracks.  Remove anything that might make you trip as you walk through a door, such as a raised step or threshold.  Trim any bushes or trees on the path to your home.  Use bright outdoor lighting.  Clear any walking paths of anything that might make someone trip, such as rocks or tools.  Regularly check to see if handrails are loose or broken. Make sure that both sides of any steps have handrails.  Any raised decks and porches should have guardrails on the edges.  Have any leaves, snow, or ice cleared regularly.  Use sand or salt on walking paths during winter.  Clean up any spills in your garage right away. This includes oil or grease spills. What can I do in the bathroom?  Use night lights.  Install grab bars by the toilet and in the tub and shower. Do not use towel bars as grab bars.  Use non-skid mats or decals in the tub or shower.  If you need to sit down in the shower, use a plastic, non-slip stool.  Keep the floor dry. Clean up any water that spills on the floor as soon as it happens.  Remove soap buildup in the tub or shower regularly.  Attach bath  mats securely with double-sided non-slip rug tape.  Do not have throw rugs and other things on the floor that can make you trip. What can I do in the bedroom?  Use night lights.  Make sure that you have a light by your bed that is easy to reach.  Do not use any sheets or blankets that are too big for your bed. They should not hang down onto the floor.  Have a firm chair that has side arms. You can use this for support while you get dressed.  Do not have throw rugs and other things on the floor that can make you trip. What can I do in the kitchen?  Clean up any spills right away.  Avoid walking on wet floors.  Keep items that you use a lot in easy-to-reach places.  If you need to reach something above you, use a strong step stool that has a grab bar.  Keep electrical cords out of the way.  Do not use floor polish or wax that makes floors slippery. If you must use wax, use non-skid floor wax.  Do not have throw rugs and other things on the floor that can make you trip. What can I do with my  stairs?  Do not leave any items on the stairs.  Make sure that there are handrails on both sides of the stairs and use them. Fix handrails that are broken or loose. Make sure that handrails are as long as the stairways.  Check any carpeting to make sure that it is firmly attached to the stairs. Fix any carpet that is loose or worn.  Avoid having throw rugs at the top or bottom of the stairs. If you do have throw rugs, attach them to the floor with carpet tape.  Make sure that you have a light switch at the top of the stairs and the bottom of the stairs. If you do not have them, ask someone to add them for you. What else can I do to help prevent falls?  Wear shoes that:  Do not have high heels.  Have rubber bottoms.  Are comfortable and fit you well.  Are closed at the toe. Do not wear sandals.  If you use a stepladder:  Make sure that it is fully opened. Do not climb a closed  stepladder.  Make sure that both sides of the stepladder are locked into place.  Ask someone to hold it for you, if possible.  Clearly mark and make sure that you can see:  Any grab bars or handrails.  First and last steps.  Where the edge of each step is.  Use tools that help you move around (mobility aids) if they are needed. These include:  Canes.  Walkers.  Scooters.  Crutches.  Turn on the lights when you go into a dark area. Replace any light bulbs as soon as they burn out.  Set up your furniture so you have a clear path. Avoid moving your furniture around.  If any of your floors are uneven, fix them.  If there are any pets around you, be aware of where they are.  Review your medicines with your doctor. Some medicines can make you feel dizzy. This can increase your chance of falling. Ask your doctor what other things that you can do to help prevent falls. This information is not intended to replace advice given to you by your health care provider. Make sure you discuss any questions you have with your health care provider. Document Released: 04/25/2009 Document Revised: 12/05/2015 Document Reviewed: 08/03/2014 Elsevier Interactive Patient Education  2017 [employer].

## 2020-10-11 ENCOUNTER — Telehealth: Payer: Self-pay | Admitting: *Deleted

## 2020-10-11 NOTE — Telephone Encounter (Signed)
Called and spoke with patient to clarify if he wanted his montelukast and Breo sent to [facility].  He is fine leaving his prescriptions at [facility] at this time.  Nothing further needed at this time.

## 2020-11-07 ENCOUNTER — Telehealth: Payer: Self-pay

## 2020-11-07 NOTE — Progress Notes (Signed)
    Chronic Care Management Pharmacy Assistant   Name: Andres Taylor  MRN: [id number] DOB: 11/25/1945  Reason for Encounter: Medication Review /General Adherence Call.   Recent office visits:  10/07/2020 Wilfred Lacy NP (PCP) Change Hydrochlorothiazide 12.5 mg PRN to 12.5 mg Daily.  Recent consult visits:  10/07/2020 Devonne Doughty MD (Ophthalmology) 10/02/2020 Rexene Edison NP (Pulmonary)   Hospital visits:  None in previous 6 months  Medications: Outpatient Encounter Medications as of 11/07/2020  Medication Sig  . allopurinol (ZYLOPRIM) 100 MG tablet Take 1 tablet (100 mg total) by mouth daily.  [name] aspirin EC 81 MG tablet Take 81 mg by mouth daily.  [name] atorvastatin (LIPITOR) 10 MG tablet TAKE 1 TABLET(10 MG) BY MOUTH DAILY AT 6 PM  . BREO ELLIPTA 200-25 MCG/INH AEPB INHALE 1 PUFF INTO THE LUNGS DAILY  . cetirizine (ZYRTEC) 10 MG chewable tablet Chew 10 mg by mouth daily.  . Coenzyme Q-10 100 MG capsule Take 100 mg by mouth 2 (two) times daily.  . colchicine 0.6 MG tablet Take 1 tablet (0.6 mg total) by mouth 2 (two) times daily as needed (for gout). PRN for Gout  . diltiazem (CARDIZEM CD) 180 MG 24 hr capsule TAKE 1 CAPSULE(180 MG) BY MOUTH DAILY  . furosemide (LASIX) 20 MG tablet Take 1 tablet (20 mg total) by mouth as needed. DO NOT TAKE HCTZ THE DAY YOU USE MEDICATION AS NEEDED FOR SWELLING OR SHORTNESS OF BREATH  . gabapentin (NEURONTIN) 300 MG capsule TAKE 1 CAPSULE(300 MG) BY MOUTH TWICE DAILY  . guaiFENesin (MUCINEX) 600 MG 12 hr tablet Take 600 mg by mouth daily.  . hydrochlorothiazide (MICROZIDE) 12.5 MG capsule Take 1 capsule (12.5 mg total) by mouth daily.  [name] ipratropium (ATROVENT) 0.06 % nasal spray Place 2 sprays into both nostrils in the morning and at bedtime.  . metFORMIN (GLUCOPHAGE) 850 MG tablet Take 1bid  . methocarbamol (ROBAXIN) 500 MG tablet TAKE 1 TABLET(500 MG) BY MOUTH EVERY 12 HOURS  . montelukast (SINGULAIR) 10 MG tablet TAKE 1 TABLET(10 MG) BY  MOUTH AT BEDTIME  . Multiple Vitamin (MULTIVITAMIN) tablet Take 1 tablet by mouth daily.  [name] olmesartan (BENICAR) 20 MG tablet TAKE 1 TABLET(20 MG) BY MOUTH DAILY.  [name] omega-3 acid ethyl esters (LOVAZA) 1 g capsule Take 1 capsule (1 g total) by mouth daily.  [name] omeprazole (PRILOSEC) 40 MG capsule TAKE 1 CAPSULE(40 MG) BY MOUTH DAILY  . PROAIR RESPICLICK 272 (90 Base) MCG/ACT AEPB INHALE 2 PUFFS EVERY 4-6 HOURS AS NEEDED FOR COUGH/ WHEEZE   No facility-administered encounter medications on file as of 11/07/2020.   Star Rating Drugs: Atorvastatin 10 mg last filled on 09/08/2020 for 77 day supply at [facility]. Metformin 850 mg last filled on 09/08/2020 for 90 day supply at [facility]. Olmesartan 20 mg last filled on 08/14/2020 for 90 day supply at [facility].  Called patient and discussed medication adherence  with patient, no issues at this time with current medication.   Patient denies ED visit since his last CPP follow up.  Patient denies any side effects with his medication. Patient denies any problems with his current pharmacy     Bessie Robeson Pharmacist Assistant [phone]

## 2020-11-28 ENCOUNTER — Other Ambulatory Visit: Payer: Self-pay | Admitting: Cardiology

## 2020-12-03 ENCOUNTER — Other Ambulatory Visit: Payer: Self-pay | Admitting: Nurse Practitioner

## 2020-12-10 ENCOUNTER — Other Ambulatory Visit: Payer: Self-pay | Admitting: Nurse Practitioner

## 2020-12-10 DIAGNOSIS — M1A9XX0 Chronic gout, unspecified, without tophus (tophi): Secondary | ICD-10-CM

## 2020-12-24 ENCOUNTER — Telehealth: Payer: Self-pay | Admitting: Nurse Practitioner

## 2020-12-24 DIAGNOSIS — M1A9XX0 Chronic gout, unspecified, without tophus (tophi): Secondary | ICD-10-CM

## 2020-12-24 MED ORDER — COLCHICINE 0.6 MG PO TABS: 0.6000 mg | ORAL_TABLET | Freq: Two times a day (BID) | ORAL | 1 refills | Status: DC | PRN

## 2020-12-24 NOTE — Telephone Encounter (Signed)
Chart supports Rx Last ov 09/2020 Next ov 03/2021

## 2020-12-24 NOTE — Telephone Encounter (Signed)
Pt called to follow up about refill for colchicine 0.6 MG tablet, He said refill request was sent in on 12/10/20 but hasnt heard anything back yet. Please advise

## 2020-12-26 ENCOUNTER — Other Ambulatory Visit: Payer: Self-pay | Admitting: Nurse Practitioner

## 2020-12-26 DIAGNOSIS — E1142 Type 2 diabetes mellitus with diabetic polyneuropathy: Secondary | ICD-10-CM

## 2021-01-14 ENCOUNTER — Emergency Department (HOSPITAL_BASED_OUTPATIENT_CLINIC_OR_DEPARTMENT_OTHER): Payer: Medicare Other

## 2021-01-14 ENCOUNTER — Encounter (HOSPITAL_BASED_OUTPATIENT_CLINIC_OR_DEPARTMENT_OTHER): Payer: Self-pay

## 2021-01-14 ENCOUNTER — Emergency Department (HOSPITAL_BASED_OUTPATIENT_CLINIC_OR_DEPARTMENT_OTHER)
Admission: EM | Admit: 2021-01-14 | Discharge: 2021-01-14 | Disposition: A | Discharge status: Home / Self Care | Payer: Medicare Other | Attending: Emergency Medicine | Admitting: Emergency Medicine

## 2021-01-14 ENCOUNTER — Other Ambulatory Visit: Payer: Self-pay

## 2021-01-14 DIAGNOSIS — R600 Localized edema: Secondary | ICD-10-CM | POA: Diagnosis not present

## 2021-01-14 DIAGNOSIS — Y9301 Activity, walking, marching and hiking: Secondary | ICD-10-CM | POA: Diagnosis not present

## 2021-01-14 DIAGNOSIS — M19071 Primary osteoarthritis, right ankle and foot: Secondary | ICD-10-CM | POA: Diagnosis not present

## 2021-01-14 DIAGNOSIS — E1122 Type 2 diabetes mellitus with diabetic chronic kidney disease: Secondary | ICD-10-CM | POA: Diagnosis not present

## 2021-01-14 DIAGNOSIS — M79671 Pain in right foot: Secondary | ICD-10-CM | POA: Diagnosis not present

## 2021-01-14 DIAGNOSIS — M79604 Pain in right leg: Secondary | ICD-10-CM | POA: Diagnosis not present

## 2021-01-14 DIAGNOSIS — Z7984 Long term (current) use of oral hypoglycemic drugs: Secondary | ICD-10-CM | POA: Insufficient documentation

## 2021-01-14 DIAGNOSIS — Y92480 Sidewalk as the place of occurrence of the external cause: Secondary | ICD-10-CM | POA: Insufficient documentation

## 2021-01-14 DIAGNOSIS — X509XXA Other and unspecified overexertion or strenuous movements or postures, initial encounter: Secondary | ICD-10-CM | POA: Diagnosis not present

## 2021-01-14 DIAGNOSIS — Z87891 Personal history of nicotine dependence: Secondary | ICD-10-CM | POA: Diagnosis not present

## 2021-01-14 DIAGNOSIS — J45909 Unspecified asthma, uncomplicated: Secondary | ICD-10-CM | POA: Insufficient documentation

## 2021-01-14 DIAGNOSIS — I129 Hypertensive chronic kidney disease with stage 1 through stage 4 chronic kidney disease, or unspecified chronic kidney disease: Secondary | ICD-10-CM | POA: Insufficient documentation

## 2021-01-14 DIAGNOSIS — Z79899 Other long term (current) drug therapy: Secondary | ICD-10-CM | POA: Diagnosis not present

## 2021-01-14 DIAGNOSIS — M7989 Other specified soft tissue disorders: Secondary | ICD-10-CM | POA: Diagnosis not present

## 2021-01-14 DIAGNOSIS — Z7982 Long term (current) use of aspirin: Secondary | ICD-10-CM | POA: Diagnosis not present

## 2021-01-14 DIAGNOSIS — N182 Chronic kidney disease, stage 2 (mild): Secondary | ICD-10-CM | POA: Diagnosis not present

## 2021-01-14 DIAGNOSIS — M7731 Calcaneal spur, right foot: Secondary | ICD-10-CM | POA: Diagnosis not present

## 2021-01-14 NOTE — ED Triage Notes (Signed)
Pt c/o right ankle pain/foot pain and swelling since yesterday. 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 Denies known injury.

## 2021-01-14 NOTE — ED Notes (Signed)
Patient states the ankle swelling, pain started yesterday after awkwardly stepping off of a curb, doesn't recall pain at the time but states that "I don't have a lot of strength or feeling in that ankle".  Patient does report pain in ankle now, also on bottom of foot.  Ice Pack applied to ankle.

## 2021-01-14 NOTE — ED Notes (Addendum)
Patient transported to x-ray. ?

## 2021-01-14 NOTE — ED Provider Notes (Signed)
[facility] EMERGENCY DEPARTMENT Provider Note   CSN: [id number] Arrival date & time: 01/14/21  1054     History Chief Complaint  Patient presents with   Foot Pain    Andres Taylor is a 75 y.o. male with a history of diabetes mellitus, CKD, hypertension.  Patient also reports history of neuropathy and right foot drop.  Patient comes to the emergency department with a chief complaint of right foot/ankle pain.  Pain has been present since yesterday at approximately 1000 hrs.  Pain has been constant since then.  Patient describes his pain as a "sore."  Patient reports pain is worse with weightbearing.  Patient denies any alleviating factors.  Patient endorses swelling to right foot and ankle.  Numbness and weakness to affected limb at baseline; patient denies any change in the symptoms.  Patient denies any wounds, color change, rash, or pallor.  Patient states that yesterday he stepped off of a curb landing hard on his left foot.  Patient denies falling or hitting his head.  Patient states that pain became gradually worse as he was driving in a vehicle over 2 hours.   Foot Pain      Past Medical History:  Diagnosis Date   Abdominal bloating 03/13/2019   Allergic rhinitis    Allergy    Arthritis    Asthma    Blood in urine    Cataract    forming    Chronic cough    CKD (chronic kidney disease), stage II    DDD (degenerative disc disease)    Diabetes mellitus ([place])    DOE (dyspnea on exertion)    Eustachian tube dysfunction    Fatty liver    GERD (gastroesophageal reflux disease)    Glaucoma    Gout    Hard of hearing    bilateral    Heme positive stool 03/13/2019   History of cardiomegaly 05/16/2019   Noted on CXR   History of kidney stones    Hyperlipidemia    Hypertension    Inguinal hernia    Bilateral   Joint pain, knee    Leg edema    Lung tumor (benign), right    Right upper lobe lung mass   Obesity    OSA on CPAP    Pneumonitis    right  lung   Pulmonary HTN ([place]) 04/18/2013   Restrictive lung disease    Sciatica    Spinal stenosis    Wears contact lenses    Wears glasses    Wears hearing aid in both ears     Patient Active Problem List   Diagnosis Date Noted   Difficult airway for intubation 09/21/2019   Bilateral inguinal hernia (BIH) 09/21/2019   Asthma 05/25/2019   Indigestion 03/13/2019   Orchitis, right 03/13/2019   Generalized abdominal pain 03/13/2019   Chronic cough 09/08/2018   Lumbar radiculopathy, right 06/07/2018   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   CKD (chronic kidney disease) stage 2, GFR 60-89 ml/min 05/17/2018   OSA on CPAP 05/17/2018   Restrictive lung disease 05/17/2018   Cervical radiculopathy 05/17/2018   Diabetic neuropathy associated with type 2 diabetes mellitus ([place]) 05/17/2018   DM (diabetes mellitus), type 2 ([place]) 05/17/2018   Hyperuricemia 05/17/2018   Dermatochalasis of both upper eyelids 02/25/2017   Acute angle-closure glaucoma of both eyes 12/04/2016   Hyperopia with astigmatism and presbyopia, bilateral 12/01/2016   Nuclear sclerotic cataract of both eyes 12/01/2016  Posterior subcapsular age-related cataract of both eyes 12/01/2016   Vitreous floater, bilateral 12/01/2016   Lung mass 08/30/2013   Class 2 severe obesity due to excess calories with serious comorbidity and body mass index (BMI) of 38.0 to 38.9 in adult [facility]) 04/18/2013   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   Pre-operative cardiovascular examination 04/18/2013   Other and unspecified coagulation defects 04/18/2013   Dyspnea on exertion 04/17/2013   Leg edema    Mixed hyperlipidemia    HTN (hypertension), benign     Right upper lobe lung nodule 03/10/2013    Past Surgical History:  Procedure Laterality Date   APPENDECTOMY  1976   COLONOSCOPY  08/2010   COLONOSCOPY  08/07/2019   HEMORRHOID SURGERY  2001   INGUINAL HERNIA REPAIR Left 1949   as a baby 52 months old    INGUINAL HERNIA REPAIR Bilateral 09/21/2019   Procedure: LAPAROSCOPIC BILATERAL INGUINAL HERNIA REPAIR, LYSIS OF ADHESIONS;  Surgeon: Michael Boston, MD;  Location: [facility];  Service: General;  Laterality: Bilateral;   LEFT AND RIGHT HEART CATHETERIZATION WITH CORONARY ANGIOGRAM N/A 04/20/2013   Procedure: LEFT AND RIGHT HEART CATHETERIZATION WITH CORONARY ANGIOGRAM;  Surgeon: Leonie Man, MD;  Location: [facility] C[facility] LAB;  Angiographically normal Coronary Arteries - L Dom Cx (OM1, small LPL1 then LPL2 & PDA) & wraparound LAD (D1-3 small).  Borderline CO/CI - 4.94/2.16 by both Fick & TD.  RAP ~14 mmHg, RVEDP 15 mmHg.  LVEDP 19 mmHg (->diastolic dysfunction) PCWP 16 mmHg, but PA Mean 25 mmHg   LITHOTRIPSY     x 2   LOBECTOMY Right    lumbar disectomy L4-5  1980   NM MYOVIEW LTD  06/15/2019    EF 70%.  No ischemia or infarction.  LOW RISK    TRANSTHORACIC ECHOCARDIOGRAM  04/21/2019   EF 60 to 65%.  Mild LVH.  Normal RV.  Normal atrial size.  Mild aortic sclerosis.  No stenosis. Lipomatous intra-atrial septum   UPPER GI ENDOSCOPY  08/07/2019   VIDEO ASSISTED THORACOSCOPY (VATS)/WEDGE RESECTION Right 08/30/2013   Procedure: VIDEO ASSISTED THORACOSCOPY (VATS)/WEDGE RESECTION;  Surgeon: Melrose Nakayama, MD;  Location: [facility];  Service: Thoracic;  Laterality: Right;       Family History  Problem Relation Age of Onset   Leukemia Father    Breast cancer Maternal Grandmother    Cancer Maternal Grandmother        breast cancer   Stroke Maternal Grandmother    Hypertension Maternal Grandfather    Stroke Maternal Grandfather    Stroke Mother    Cancer Son        Ewing's sarcoma   Heart failure Son        Related to Adriamycin toxicity.  Had 2 consecutive heart transplants   AAA (abdominal aortic aneurysm) Son    Stroke Paternal Grandmother    Colon cancer Neg Hx    Esophageal cancer Neg Hx    Colon polyps Neg Hx    Rectal cancer Neg Hx    Stomach cancer Neg Hx      Social History   Tobacco Use   Smoking status: Former    Packs/day: 1.50    Years: 42.00    Pack years: 63.00    Types: Cigarettes    Quit date: 07/13/2002    Years since quitting: 18.5   Smokeless tobacco: Former  Scientific laboratory technician Use: Never used  Substance Use Topics   Alcohol use: Yes  Alcohol/week: 3.0 standard drinks    Types: 3 Cans of beer per week    Comment: 3 tmes weekly   Drug use: No    Home Medications Prior to Admission medications   Medication Sig Start Date End Date Taking? Authorizing Provider  allopurinol (ZYLOPRIM) 100 MG tablet Take 1 tablet (100 mg total) by mouth daily. 10/07/20   Nche, Charlene Brooke, NP  aspirin EC 81 MG tablet Take 81 mg by mouth daily.    [provider]  atorvastatin (LIPITOR) 10 MG tablet TAKE 1 TABLET(10 MG) BY MOUTH DAILY AT 6 PM 10/07/20   Nche, Charlene Brooke, NP  BREO ELLIPTA 200-25 MCG/INH AEPB INHALE 1 PUFF INTO THE LUNGS DAILY 12/08/20   Nche, Charlene Brooke, NP  cetirizine (ZYRTEC) 10 MG chewable tablet Chew 10 mg by mouth daily.    [provider]  Coenzyme Q-10 100 MG capsule Take 100 mg by mouth 2 (two) times daily.    [provider]  colchicine 0.6 MG tablet Take 1 tablet (0.6 mg total) by mouth 2 (two) times daily as needed (for gout). PRN for Gout 12/24/20   Nche, Charlene Brooke, NP  furosemide (LASIX) 20 MG tablet TAKE 1 TABLET BY MOUTH AS NEEDED. DO NOT TAKE HCTZ THE DAY YOU USE MEDICATION AS NEEDED FOR SWELLING OR SHORTNESS OF BREATH 11/28/20   Leonie Man, MD  gabapentin (NEURONTIN) 300 MG capsule TAKE 1 CAPSULE(300 MG) BY MOUTH TWICE DAILY 10/07/20   Nche, Charlene Brooke, NP  guaiFENesin (MUCINEX) 600 MG 12 hr tablet Take 600 mg by mouth daily.    [provider]  hydrochlorothiazide (MICROZIDE) 12.5 MG capsule Take 1 capsule (12.5 mg total) by mouth daily. 10/07/20   Nche, Charlene Brooke, NP  ipratropium (ATROVENT) 0.06 % nasal spray Place 2 sprays into both nostrils in the  morning and at bedtime. 10/02/20   Parrett, Fonnie Mu, NP  metFORMIN (GLUCOPHAGE) 850 MG tablet Take 1bid 10/07/20   Nche, Charlene Brooke, NP  methocarbamol (ROBAXIN) 500 MG tablet TAKE 1 TABLET(500 MG) BY MOUTH EVERY 12 HOURS 09/09/20   Nche, Charlene Brooke, NP  montelukast (SINGULAIR) 10 MG tablet TAKE 1 TABLET(10 MG) BY MOUTH AT BEDTIME 09/30/20   Nche, Charlene Brooke, NP  Multiple Vitamin (MULTIVITAMIN) tablet Take 1 tablet by mouth daily.    [provider]  olmesartan (BENICAR) 20 MG tablet TAKE 1 TABLET(20 MG) BY MOUTH DAILY. 10/07/20   Nche, Charlene Brooke, NP  omega-3 acid ethyl esters (LOVAZA) 1 g capsule Take 1 capsule (1 g total) by mouth daily. 10/07/20   Nche, Charlene Brooke, NP  omeprazole (PRILOSEC) 40 MG capsule TAKE 1 CAPSULE(40 MG) BY MOUTH DAILY 07/23/20   Milus Banister, MD  PROAIR RESPICLICK [phone] Base) MCG/ACT AEPB INHALE 2 PUFFS EVERY 4-6 HOURS AS NEEDED FOR COUGH/ WHEEZE 11/12/19   Nche, Charlene Brooke, NP    Allergies    Patient has no known allergies.  Review of Systems   Review of Systems  Constitutional:  Negative for chills and fever.  Musculoskeletal:  Positive for arthralgias, gait problem, joint swelling and myalgias.  Skin:  Negative for color change, pallor, rash and wound.  Neurological:  Positive for weakness (To right lower extremity at baseline) and numbness (To right lower extremity at baseline).  Psychiatric/Behavioral:  Negative for confusion.    Physical Exam Updated Vital Signs BP (!) 117/59 (BP Location: Right Arm)   Pulse 74   Temp 98.2 F (36.8 C) (Oral)  Resp 18   Ht 5\' 7"  (1.702 m)   Wt 108.9 kg   SpO2 95%   BMI 37.59 kg/m   Physical Exam Vitals and nursing note reviewed.  Constitutional:      General: He is not in acute distress.    Appearance: He is not ill-appearing, toxic-appearing or diaphoretic.  Eyes:     General: No scleral icterus.       Right eye: No discharge.        Left eye: No discharge.  Cardiovascular:     Rate  and Rhythm: Normal rate.     Pulses:          Dorsalis pedis pulses are 2+ on the right side and 2+ on the left side.  Pulmonary:     Effort: Pulmonary effort is normal. No respiratory distress.  Musculoskeletal:     Cervical back: Normal range of motion.     Right lower leg: Normal.     Left lower leg: Normal.     Right ankle: Swelling present. No deformity, ecchymosis or lacerations. Tenderness (diffuse) present. Normal range of motion.     Right Achilles Tendon: No tenderness or defects. Thompson's test negative.     Right foot: Normal range of motion and normal capillary refill. Swelling and tenderness (diffuse tenderness to dorsum) present. No deformity, laceration, bony tenderness or crepitus. Normal pulse.     Comments: 4/5 strength to plantar flexion and dorsiflexion of right ankle  Feet:     Right foot:     Skin integrity: No ulcer, blister, skin breakdown, erythema, warmth, callus, dry skin or fissure.     Left foot:     Skin integrity: No ulcer, blister, skin breakdown, erythema, warmth, callus, dry skin or fissure.  Skin:    General: Skin is warm and dry.  Neurological:     General: No focal deficit present.     Mental Status: He is alert.     GCS: GCS eye subscore is 4. GCS verbal subscore is 5. GCS motor subscore is 6.  Psychiatric:        Behavior: Behavior is cooperative.    ED Results / Procedures / Treatments   Labs (all labs ordered are listed, but only abnormal results are displayed) Labs Reviewed - No data to display  EKG None  Radiology DG Ankle Complete Right  Result Date: 01/14/2021 CLINICAL DATA:  Ankle pain and swelling after injury. EXAM: RIGHT ANKLE - COMPLETE 3+ VIEW COMPARISON:  None. FINDINGS: There is no evidence of fracture, dislocation, or joint effusion. There is no evidence of arthropathy or other focal bone abnormality. Soft tissues are unremarkable. IMPRESSION: Negative. Electronically Signed   By: Misty Stanley M.D.   On: 01/14/2021 12:02    US Venous Img Lower Unilateral Right  Result Date: 01/14/2021 CLINICAL DATA:  Right lower extremity pain and edema. Evaluate for DVT. EXAM: RIGHT LOWER EXTREMITY VENOUS DOPPLER ULTRASOUND TECHNIQUE: Gray-scale sonography with graded compression, as well as color Doppler and duplex ultrasound were performed to evaluate the lower extremity deep venous systems from the level of the common femoral vein and including the common femoral, femoral, profunda femoral, popliteal and calf veins including the posterior tibial, peroneal and gastrocnemius veins when visible. The superficial great saphenous vein was also interrogated. Spectral Doppler was utilized to evaluate flow at rest and with distal augmentation maneuvers in the common femoral, femoral and popliteal veins. COMPARISON:  None. FINDINGS: Contralateral Common Femoral Vein: Respiratory phasicity is normal and symmetric with  the symptomatic side. No evidence of thrombus. Normal compressibility. Common Femoral Vein: No evidence of thrombus. Normal compressibility, respiratory phasicity and response to augmentation. Saphenofemoral Junction: No evidence of thrombus. Normal compressibility and flow on color Doppler imaging. Profunda Femoral Vein: No evidence of thrombus. Normal compressibility and flow on color Doppler imaging. Femoral Vein: No evidence of thrombus. Normal compressibility, respiratory phasicity and response to augmentation. Popliteal Vein: No evidence of thrombus. Normal compressibility, respiratory phasicity and response to augmentation. Calf Veins: No evidence of thrombus. Normal compressibility and flow on color Doppler imaging. Superficial Great Saphenous Vein: No evidence of thrombus. Normal compressibility. Venous Reflux:  None. Other Findings:  None. IMPRESSION: No evidence of DVT within the right lower extremity. Electronically Signed   By: Sandi Mariscal M.D.   On: 01/14/2021 14:15   DG Foot Complete Right  Result Date: 01/14/2021 CLINICAL  DATA:  Pain and swelling. EXAM: RIGHT FOOT COMPLETE - 3+ VIEW COMPARISON:  Right ankle 01/14/2021 FINDINGS: Negative for fracture or dislocation. Calcaneal spurring. Mild degenerative changes at the first MTP joint. Evidence for diffuse mild soft tissue swelling. IMPRESSION: No acute bone abnormality to the right foot. Electronically Signed   By: Markus Daft M.D.   On: 01/14/2021 13:19    Procedures Procedures   Medications Ordered in ED Medications - No data to display  ED Course  I have reviewed the triage vital signs and the nursing notes.  Pertinent labs & imaging results that were available during my care of the patient were reviewed by me and considered in my medical decision making (see chart for details).    MDM Rules/Calculators/A&P                          Alert 75 year old male in no acute distress, nontoxic-appearing.  Patient presents to the emergency department with a chief complaint of right ankle/foot pain and swelling.  Symptoms started after he stepped off a curb and landed hard on his foot.  Patient denies falling.  Patient endorses decreased strength and sensation to right lower extremity at baseline, no change since patient's injury yesterday.  Physical exam as noted above.  Low suspicion for septic arthritis as patient has no warmth or systemic symptoms.  Will obtain x-ray imaging of right foot and ankle as well as ultrasound study to evaluate for DVT.  X-ray imaging shows no acute fracture or dislocation. Evidence for diffuse mild soft tissue swelling.  Ultrasound imaging shows no evidence of DVT in right lower extremity.  Suspect that patient's symptoms are musculoskeletal in nature.  Patient advised to elevate leg and apply ice to reduce swelling.  Patient advised to use over-the-counter medication to manage his pain.  Patient advised to follow-up with his primary care provider if symptoms do not improve.  Patient given strict return cautions.  Patient expressed  understanding of all instructions and is agreeable with this plan.  Final Clinical Impression(s) / ED Diagnoses Final diagnoses:  Foot pain, right    Rx / DC Orders ED Discharge Orders     None        Dyann Ruddle 01/14/21 2253    Gareth Morgan, MD 01/15/21 2212

## 2021-01-14 NOTE — Discharge Instructions (Addendum)
You came to the emergency department today to be evaluated for your foot pain and swelling.  X-rays of your right ankle and foot showed no broken bones or dislocations.  The ultrasound of your right lower leg showed no blood clots.  Your pain is likely musculoskeletal in nature.  Please take over-the-counter pain medicine as needed to help with your symptoms.  Follow-up with your primary care provider if your symptoms do not improve.  Get help right away if: Your foot is numb or tingling. Your foot or toes are swollen. Your foot or toes turn white or blue. You have warmth and redness along your foot.

## 2021-01-14 NOTE — ED Notes (Signed)
Patient transported to X-ray 

## 2021-01-23 ENCOUNTER — Other Ambulatory Visit: Payer: Self-pay

## 2021-01-23 ENCOUNTER — Other Ambulatory Visit: Payer: Self-pay | Admitting: Nurse Practitioner

## 2021-01-23 DIAGNOSIS — E1142 Type 2 diabetes mellitus with diabetic polyneuropathy: Secondary | ICD-10-CM

## 2021-01-23 MED ORDER — METFORMIN HCL 850 MG PO TABS: ORAL_TABLET | ORAL | 3 refills | Status: DC

## 2021-01-23 NOTE — Telephone Encounter (Signed)
Pharmacy states they did not receive medication refill. Sending Rx in.

## 2021-01-23 NOTE — Telephone Encounter (Signed)
Duplicate request

## 2021-01-27 ENCOUNTER — Other Ambulatory Visit: Payer: Self-pay | Admitting: Nurse Practitioner

## 2021-01-27 DIAGNOSIS — E782 Mixed hyperlipidemia: Secondary | ICD-10-CM

## 2021-01-27 NOTE — Telephone Encounter (Signed)
Chart supports Rx Last ov 09/2020 Next ov 03/2021

## 2021-02-04 ENCOUNTER — Other Ambulatory Visit: Payer: Self-pay | Admitting: Nurse Practitioner

## 2021-02-04 DIAGNOSIS — I10 Essential (primary) hypertension: Secondary | ICD-10-CM

## 2021-02-18 ENCOUNTER — Telehealth: Payer: Self-pay

## 2021-02-18 NOTE — Progress Notes (Signed)
Chronic Care Management Pharmacy Assistant   Name: Andres Taylor  MRN: [id number] DOB: [birth year]  Reason for Encounter:Diabetes Disease State call.  Recent office visits:  No recent Office Visit  Recent consult visits:  No recent consult Visit  Hospital visits:  Medication Reconciliation was completed by comparing discharge summary, patient's EMR and Pharmacy list, and upon discussion with patient.  Admitted to the hospital on 01/14/2021 due to Foot pain. Discharge date was 01/14/2021. Discharged from [facility]?Medications Started at [facility] Discharge:?? -started None  Medication Changes at Hospital Discharge: -Changed None  Medications Discontinued at Hospital Discharge: -Stopped None  Medications that remain the same after Hospital Discharge:??  -All other medications will remain the same.    Medications: Outpatient Encounter Medications as of 02/18/2021  Medication Sig   allopurinol (ZYLOPRIM) 100 MG tablet Take 1 tablet (100 mg total) by mouth daily.   aspirin EC 81 MG tablet Take 81 mg by mouth daily.   atorvastatin (LIPITOR) 10 MG tablet TAKE 1 TABLET(10 MG) BY MOUTH DAILY AT 6 PM   BREO ELLIPTA 200-25 MCG/INH AEPB INHALE 1 PUFF INTO THE LUNGS DAILY   cetirizine (ZYRTEC) 10 MG chewable tablet Chew 10 mg by mouth daily.   Coenzyme Q-10 100 MG capsule Take 100 mg by mouth 2 (two) times daily.   colchicine 0.6 MG tablet Take 1 tablet (0.6 mg total) by mouth 2 (two) times daily as needed (for gout). PRN for Gout   furosemide (LASIX) 20 MG tablet TAKE 1 TABLET BY MOUTH AS NEEDED. DO NOT TAKE HCTZ THE DAY YOU USE MEDICATION AS NEEDED FOR SWELLING OR SHORTNESS OF BREATH   gabapentin (NEURONTIN) 300 MG capsule TAKE 1 CAPSULE(300 MG) BY MOUTH TWICE DAILY   guaiFENesin (MUCINEX) 600 MG 12 hr tablet Take 600 mg by mouth daily.   hydrochlorothiazide (MICROZIDE) 12.5 MG capsule Take 1 capsule (12.5 mg total) by mouth daily.   ipratropium  (ATROVENT) 0.06 % nasal spray Place 2 sprays into both nostrils in the morning and at bedtime.   metFORMIN (GLUCOPHAGE) 850 MG tablet Take 1bid   methocarbamol (ROBAXIN) 500 MG tablet TAKE 1 TABLET(500 MG) BY MOUTH EVERY 12 HOURS   montelukast (SINGULAIR) 10 MG tablet TAKE 1 TABLET(10 MG) BY MOUTH AT BEDTIME   Multiple Vitamin (MULTIVITAMIN) tablet Take 1 tablet by mouth daily.   olmesartan (BENICAR) 20 MG tablet TAKE 1 TABLET(20 MG) BY MOUTH DAILY   omega-3 acid ethyl esters (LOVAZA) 1 g capsule TAKE 1 CAPSULE BY MOUTH DAILY   omeprazole (PRILOSEC) 40 MG capsule TAKE 1 CAPSULE(40 MG) BY MOUTH DAILY   PROAIR RESPICLICK [id number] (90 Base) MCG/ACT AEPB INHALE 2 PUFFS EVERY 4-6 HOURS AS NEEDED FOR COUGH/ WHEEZE   No facility-administered encounter medications on file as of 02/18/2021.    Care Gaps: Shingrix Vaccine Ophthalmology Exam Covid-19 Vaccine Influenza Vaccine Star Rating Drugs: Atorvastatin 10 mg last filled on 09/08/2020 for 77 day supply at [facility]. Metformin 850 mg last filled on 01/22/2021 for 90 day supply at [facility]. Olmesartan 20 mg last filled on 02/05/2021 for 90 day supply at [facility].  Medication Fill Gaps: Gabapentin 300 MG 90 day supply last filled on 09/13/2020   Recent Relevant Labs: Lab Results  Component Value Date/Time   HGBA1C 6.7 (H) 10/07/2020 09:24 AM   HGBA1C 6.3 01/02/2020 01:34 PM   MICROALBUR <0.7 08/15/2019 08:56 AM   MICROALBUR 1.0 06/06/2018 11:14 AM    Kidney Function Lab  Results  Component Value Date/Time   CREATININE 1.13 10/07/2020 09:24 AM   CREATININE 0.97 01/02/2020 01:34 PM   CREATININE 0.93 04/18/2013 02:00 PM   GFR 63.31 10/07/2020 09:24 AM   GFRNONAA >60 05/16/2019 04:27 PM   GFRAA >60 05/16/2019 04:27 PM    Current antihyperglycemic regimen:  Metformin 850 mg  What recent interventions/DTPs have been made to improve glycemic control:  None ID Have there been any recent hospitalizations or ED visits  since last visit with CPP? Yes Patient denies hypoglycemic symptoms, including Pale, Sweaty, Shaky, Hungry, Nervous/irritable, and Vision changes Patient denies hyperglycemic symptoms, including blurry vision, excessive thirst, fatigue, polyuria, and weakness How often are you checking your blood sugar? once daily What are your blood sugars ranging?  Patient states his blood sugar is ranging around 120 with a high at 146. During the week, how often does your blood glucose drop below 70? Never Are you checking your feet daily/regularly?   Patient denies numbness,pain or tingling sensation in his feet.  Patient states he received a call from someone about four months ago saying she was from Medicare but called from [facility] , (may have been the  nurse doing his AWV) but the patient  inform her he couldn't hear her because his hearing aid was dying.Patient states she was going to call him back on another day and never did.Patient reports she told him he should be able to get some meter that would check his diabetes without sticking his fingers, and she was going to send the meter to him.Patient is wanting to know if he able to get a libre device because he rather not stick hisself.Notified Clinical pharmacist to inform me on the next step to try to help the patient receive a libre deceive.   Per Clinical Pharmacist,In order for Medicare to cover it he needs to be on insulin and checking his blood sugars 3-4 times daily. Otherwise it will not be covered and he will have to pay cash price.  Informed patient of clinical pharmacist response. Patient states 30 minutes after he spoke with me he received a call from his PCP office stating they were going to send him a deceive free of charge, and a new Medicare card.Patient reports the lady that was on the phone stated she was from Medicare.  Adherence Review: Is the patient currently on a STATIN medication? Yes Is the patient currently on ACE/ARB medication?  Yes Does the patient have >5 day gap between last estimated fill dates? Yes  [place] Pharmacist Assistant [phone]

## 2021-03-10 ENCOUNTER — Telehealth: Payer: Self-pay

## 2021-03-10 NOTE — Telephone Encounter (Signed)
Jason at [facility] called regarding paperwork sent concerning Patients glucose monitoring. He wants to know if we received it and what the turn around time will be.  His direct line is: [phone] His fax is: [phone]

## 2021-03-12 DIAGNOSIS — D3612 Benign neoplasm of peripheral nerves and autonomic nervous system, upper limb, including shoulder: Secondary | ICD-10-CM | POA: Diagnosis not present

## 2021-03-12 DIAGNOSIS — D235 Other benign neoplasm of skin of trunk: Secondary | ICD-10-CM | POA: Diagnosis not present

## 2021-03-12 DIAGNOSIS — Z85828 Personal history of other malignant neoplasm of skin: Secondary | ICD-10-CM | POA: Diagnosis not present

## 2021-03-12 DIAGNOSIS — D485 Neoplasm of uncertain behavior of skin: Secondary | ICD-10-CM | POA: Diagnosis not present

## 2021-03-12 DIAGNOSIS — L570 Actinic keratosis: Secondary | ICD-10-CM | POA: Diagnosis not present

## 2021-03-13 ENCOUNTER — Telehealth: Payer: Self-pay | Admitting: Nurse Practitioner

## 2021-03-13 NOTE — Telephone Encounter (Signed)
Pt called in needing refills on "all my scripts", he gave me an order#, but he did not know which ones he needed. Please advise pt, he is very flustered.

## 2021-03-13 NOTE — Telephone Encounter (Signed)
Spoke with patient today and he has updated pharmacy because he is no longer using [facility].

## 2021-03-13 NOTE — Telephone Encounter (Signed)
Spoke with patient and updated pharmacy information.

## 2021-03-21 NOTE — Telephone Encounter (Addendum)
Form completed and faxed today. LVM informing Corene Cornea fax has been sent.

## 2021-03-21 NOTE — Telephone Encounter (Signed)
Corene Cornea would like a call back concerning the glucose monitoring.His direct line is: [phone] His fax is: [phone]

## 2021-03-24 NOTE — Telephone Encounter (Signed)
Call from [place] with [facility] for last clinic notes to support DM glucose supplies. Faxed to [phone] based on orders being sent (see below).

## 2021-03-26 ENCOUNTER — Telehealth: Payer: Self-pay | Admitting: Family Medicine

## 2021-03-26 ENCOUNTER — Telehealth: Payer: Self-pay | Admitting: Nurse Practitioner

## 2021-03-26 NOTE — Telephone Encounter (Signed)
[  11:24 AM] McKissick, Luvenia Starch do you have time to speak with a pharmacist?Jameir M. [facility] : [facility] Male, 75 y.o., 12-14-1945 MRN: [id number] The pharmacist was calling to get a refill . It is for a glucose medicine.

## 2021-03-26 NOTE — Telephone Encounter (Signed)
[  11:24 AM] Taylor, Andres Starch do you have time to speak with a pharmacist?Andres M. [facility] Male, 75 y.o., 04-16-1946 MRN: [id number] The pharmacist was calling to get a refill . It is for a glucose medicine.

## 2021-03-27 ENCOUNTER — Telehealth: Payer: Self-pay | Admitting: Nurse Practitioner

## 2021-03-27 ENCOUNTER — Other Ambulatory Visit: Payer: Self-pay | Admitting: Nurse Practitioner

## 2021-03-27 DIAGNOSIS — R053 Chronic cough: Secondary | ICD-10-CM

## 2021-03-27 DIAGNOSIS — M5416 Radiculopathy, lumbar region: Secondary | ICD-10-CM

## 2021-03-29 ENCOUNTER — Other Ambulatory Visit: Payer: Self-pay | Admitting: Nurse Practitioner

## 2021-03-29 DIAGNOSIS — M1A9XX0 Chronic gout, unspecified, without tophus (tophi): Secondary | ICD-10-CM

## 2021-03-31 ENCOUNTER — Telehealth: Payer: Self-pay | Admitting: Nurse Practitioner

## 2021-04-03 ENCOUNTER — Telehealth: Payer: Self-pay | Admitting: Nurse Practitioner

## 2021-04-03 NOTE — Telephone Encounter (Signed)
What is the name of the medication? methocarbamol (ROBAXIN) 500 MG tablet [[id number]]   Have you contacted your pharmacy to request a refill? He is completely out of this.  Which pharmacy would you like this sent to? Pharmacy  [id number] - [address], [address] AT [address]  [place], [place] [address]  Phone:  [phone]  Fax:  [phone]  DEA #:  [id number]     Patient notified that their request is being sent to the clinical staff for review and that they should receive a call once it is complete. If they do not receive a call within 72 hours they can check with their pharmacy or our office.

## 2021-04-04 NOTE — Telephone Encounter (Signed)
Rx request sent PCP for review. Sw,cma

## 2021-04-07 DIAGNOSIS — D235 Other benign neoplasm of skin of trunk: Secondary | ICD-10-CM | POA: Diagnosis not present

## 2021-04-07 NOTE — Telephone Encounter (Signed)
Spoke with patient and states he does not need any thing like this. Pt states he has been getting several calls from insurance claiming he needs all of these things that he does not want. I informed patient we would make a not of this.

## 2021-04-07 NOTE — Telephone Encounter (Signed)
Pt does not use [facility]. Confirmed with patient he uses [facility]

## 2021-04-09 ENCOUNTER — Other Ambulatory Visit: Payer: Self-pay

## 2021-04-09 ENCOUNTER — Encounter: Payer: Self-pay | Admitting: Nurse Practitioner

## 2021-04-09 ENCOUNTER — Ambulatory Visit (INDEPENDENT_AMBULATORY_CARE_PROVIDER_SITE_OTHER): Payer: Medicare Other | Admitting: Nurse Practitioner

## 2021-04-09 VITALS — BP 106/54 | HR 73 | Temp 96.6°F | Ht 67.0 in | Wt 244.0 lb

## 2021-04-09 DIAGNOSIS — Z23 Encounter for immunization: Secondary | ICD-10-CM | POA: Diagnosis not present

## 2021-04-09 DIAGNOSIS — Z125 Encounter for screening for malignant neoplasm of prostate: Secondary | ICD-10-CM

## 2021-04-09 DIAGNOSIS — I10 Essential (primary) hypertension: Secondary | ICD-10-CM | POA: Diagnosis not present

## 2021-04-09 DIAGNOSIS — I208 Other forms of angina pectoris: Secondary | ICD-10-CM | POA: Diagnosis not present

## 2021-04-09 DIAGNOSIS — E782 Mixed hyperlipidemia: Secondary | ICD-10-CM | POA: Diagnosis not present

## 2021-04-09 DIAGNOSIS — N182 Chronic kidney disease, stage 2 (mild): Secondary | ICD-10-CM

## 2021-04-09 DIAGNOSIS — E1142 Type 2 diabetes mellitus with diabetic polyneuropathy: Secondary | ICD-10-CM

## 2021-04-09 LAB — BASIC METABOLIC PANEL
BUN: 20 mg/dL (ref 6–23)
CO2: 31 mEq/L (ref 19–32)
Calcium: 9.4 mg/dL (ref 8.4–10.5)
Chloride: 98 mEq/L (ref 96–112)
Creatinine, Ser: 1.12 mg/dL (ref 0.40–1.50)
GFR: 63.77 mL/min (ref >60.00)
Glucose, Bld: 140 mg/dL — ABNORMAL HIGH (ref 70–99)
Potassium: 4.1 mEq/L (ref 3.5–5.1)
Sodium: 137 mEq/L (ref 135–145)

## 2021-04-09 LAB — LIPID PANEL
Cholesterol: 186 mg/dL (ref 0–200)
HDL: 36.4 mg/dL — ABNORMAL LOW (ref >39.00)
LDL Cholesterol: 115 mg/dL — ABNORMAL HIGH (ref 0–99)
NonHDL: 150.06
Total CHOL/HDL Ratio: 5
Triglycerides: 175 mg/dL — ABNORMAL HIGH (ref 0.0–149.0)
VLDL: 35 mg/dL (ref 0.0–40.0)

## 2021-04-09 LAB — MICROALBUMIN / CREATININE URINE RATIO
Creatinine,U: 127.9 mg/dL
Microalb Creat Ratio: 0.8 mg/g (ref 0.0–30.0)
Microalb, Ur: 1 mg/dL (ref 0.0–1.9)

## 2021-04-09 LAB — PSA, MEDICARE: PSA: 1.07 ng/ml (ref 0.10–4.00)

## 2021-04-09 LAB — HEMOGLOBIN A1C: Hgb A1c MFr Bld: 6.6 % — ABNORMAL HIGH (ref 4.6–6.5)

## 2021-04-09 NOTE — Patient Instructions (Addendum)
You can receive your shingrix vaccine from your retail phamarcy.  Go to lab for blood draw and urine collection.  Look into [employer] silver sneaker program and water aerobics.

## 2021-04-09 NOTE — Progress Notes (Signed)
Subjective:  Patient ID: Andres Taylor, male    DOB: 05-Dec-1945  Age: 75 y.o. MRN: [id number]  CC: Follow-up (6 mo f/u HTN. No main concerns. )  HPI  HTN (hypertension), benign BP at goal with HCTZ, and benicar Furosemide used prn for LE edema  BP Readings from Last 3 Encounters:  04/09/21 (!) 106/54  01/14/21 123/77  10/07/20 118/70   Repeat BMP: stable maintain current medication doses  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HCC) Under the care of Dr. Darcus Taylor. Today he denies any chest pain or SOB with exertion. Current use of diltiazem, atorvastatin, lovaza and coQ10 Reports compliance with CPAP machine. Repeat lipid panel today.  DM (diabetes mellitus), type 2 (Andres Taylor) Advised about need for diabetic eye exam. Current use of metfomin Stable neuropathy with gabapentin LDL not at goal: current use of atorvastatin.  current use of olmesartan BP at goal  Repeat hgbA1c and urine micoalbumin: controlled at 6.6%, negative urine microalbumin  CKD (chronic kidney disease) stage 2, GFR 60-89 ml/min Repeat BMP: stable  Mixed hyperlipidemia Repeat lipid panel Lipid Panel     Component Value Date/Time   CHOL 186 04/09/2021 1016   TRIG 175.0 (H) 04/09/2021 1016   HDL 36.40 (L) 04/09/2021 1016   CHOLHDL 5 04/09/2021 1016   VLDL 35.0 04/09/2021 1016   LDLCALC 115 (H) 04/09/2021 1016   LDLDIRECT 117.0 10/07/2020 0924   Current use of atorvastatin Advised about importance of daily exercise and maintaining DASH diet  Wt Readings from Last 3 Encounters:  04/09/21 244 lb (110.7 kg)  01/14/21 240 lb (108.9 kg)  10/08/20 246 lb (111.6 kg)    Reviewed past Medical, Social and Family history today.  Outpatient Medications Prior to Visit  Medication Sig Dispense Refill   allopurinol (ZYLOPRIM) 100 MG tablet Take 1 tablet (100 mg total) by mouth daily. 90 tablet 3   aspirin EC 81 MG tablet Take 81 mg by mouth daily.     atorvastatin (LIPITOR) 10 MG tablet TAKE 1 TABLET(10 MG) BY  MOUTH DAILY AT 6 PM 90 tablet 3   BREO ELLIPTA 200-25 MCG/INH AEPB INHALE 1 PUFF INTO THE LUNGS DAILY 60 each 5   colchicine 0.6 MG tablet Take 1 tablet (0.6 mg total) by mouth 2 (two) times daily as needed (for gout). PRN for Gout 6 tablet 1   furosemide (LASIX) 20 MG tablet TAKE 1 TABLET BY MOUTH AS NEEDED. DO NOT TAKE HCTZ THE DAY YOU USE MEDICATION AS NEEDED FOR SWELLING OR SHORTNESS OF BREATH 30 tablet 6   guaiFENesin (MUCINEX) 600 MG 12 hr tablet Take 600 mg by mouth daily.     ipratropium (ATROVENT) 0.06 % nasal spray Place 2 sprays into both nostrils in the morning and at bedtime. 30 each 6   metFORMIN (GLUCOPHAGE) 850 MG tablet Take 1bid 180 tablet 3   methocarbamol (ROBAXIN) 500 MG tablet TAKE 1 TABLET(500 MG) BY MOUTH EVERY 12 HOURS 180 tablet 1   Multiple Vitamin (MULTIVITAMIN) tablet Take 1 tablet by mouth daily.     olmesartan (BENICAR) 20 MG tablet TAKE 1 TABLET(20 MG) BY MOUTH DAILY 90 tablet 3   omega-3 acid ethyl esters (LOVAZA) 1 g capsule TAKE 1 CAPSULE BY MOUTH DAILY 90 capsule 3   omeprazole (PRILOSEC) 40 MG capsule TAKE 1 CAPSULE(40 MG) BY MOUTH DAILY 90 capsule 2   PROAIR RESPICLICK 400 (90 Base) MCG/ACT AEPB INHALE 2 PUFFS EVERY 4-6 HOURS AS NEEDED FOR COUGH/ WHEEZE 1 each 0   hydrochlorothiazide (MICROZIDE) 12.5 MG  capsule Take 1 capsule (12.5 mg total) by mouth daily. 90 capsule 1   cetirizine (ZYRTEC) 10 MG chewable tablet Chew 10 mg by mouth daily.     Coenzyme Q-10 100 MG capsule Take 100 mg by mouth 2 (two) times daily.     diltiazem (CARDIZEM CD) 180 MG 24 hr capsule Take 180 mg by mouth daily.     montelukast (SINGULAIR) 10 MG tablet TAKE 1 TABLET(10 MG) BY MOUTH AT BEDTIME 90 tablet 1   gabapentin (NEURONTIN) 300 MG capsule TAKE 1 CAPSULE(300 MG) BY MOUTH TWICE DAILY 180 capsule 3   No facility-administered medications prior to visit.    ROS See HPI  Objective:  BP (!) 106/54   Pulse 73   Temp (!) 96.6 F (35.9 C) (Temporal)   Ht 5\' 7"  (1.702 m)   Wt  244 lb (110.7 kg)   SpO2 93%   BMI 38.22 kg/m   Physical Exam Vitals reviewed.  Constitutional:      Appearance: He is obese.  Cardiovascular:     Rate and Rhythm: Normal rate and regular rhythm.     Pulses: Normal pulses.     Heart sounds: Normal heart sounds.  Pulmonary:     Effort: Pulmonary effort is normal.     Breath sounds: Normal breath sounds.  Musculoskeletal:     Right lower leg: No edema.     Left lower leg: No edema.  Neurological:     Mental Status: He is alert and oriented to person, place, and time.  Psychiatric:        Mood and Affect: Mood normal.        Behavior: Behavior normal.        Thought Content: Thought content normal.    Assessment & Plan:  This visit occurred during the SARS-CoV-2 public health emergency.  Safety protocols were in place, including screening questions prior to the visit, additional usage of staff PPE, and extensive cleaning of exam room while observing appropriate contact time as indicated for disinfecting solutions.   Andres Taylor was seen today for follow-up.  Diagnoses and all orders for this visit:  HTN (hypertension), benign -     Basic metabolic panel -     hydrochlorothiazide (MICROZIDE) 12.5 MG capsule; Take 1 capsule (12.5 mg total) by mouth daily.  Type 2 diabetes mellitus with diabetic polyneuropathy, without long-term current use of insulin (HCC) -     Hemoglobin A1c -     Basic metabolic panel -     Microalbumin / creatinine urine ratio  CKD (chronic kidney disease) stage 2, GFR 60-89 ml/min -     Basic metabolic panel  Mixed hyperlipidemia -     Lipid panel  Prostate cancer screening -     PSA, Medicare ( [place] only)  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[place])  Need for influenza vaccination -     Flu Vaccine QUAD High Dose(Fluad)  You can receive your shingrix vaccine from your retail phamarcy. Look into [employer] silver sneaker program and water aerobics.  Problem List Items Addressed This Visit        Cardiovascular and Mediastinum   HTN (hypertension), benign - Primary (Chronic)    BP at goal with HCTZ, and benicar Furosemide used prn for LE edema  BP Readings from Last 3 Encounters:  04/09/21 (!) 106/54  01/14/21 123/77  10/07/20 118/70   Repeat BMP: stable maintain current medication doses      Relevant Medications   diltiazem (CARDIZEM CD) 180 MG 24  hr capsule   hydrochlorothiazide (MICROZIDE) 12.5 MG capsule   Other Relevant Orders   Basic metabolic panel (Completed)   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HCC)    Under the care of Dr. Darcus Taylor. Today he denies any chest pain or SOB with exertion. Current use of diltiazem, atorvastatin, lovaza and coQ10 Reports compliance with CPAP machine. Repeat lipid panel today.      Relevant Medications   diltiazem (CARDIZEM CD) 180 MG 24 hr capsule   hydrochlorothiazide (MICROZIDE) 12.5 MG capsule     Endocrine   DM (diabetes mellitus), type 2 ([place])    Advised about need for diabetic eye exam. Current use of metfomin Stable neuropathy with gabapentin LDL not at goal: current use of atorvastatin.  current use of olmesartan BP at goal  Repeat hgbA1c and urine micoalbumin: controlled at 6.6%, negative urine microalbumin      Relevant Orders   Hemoglobin A1c (Completed)   Basic metabolic panel (Completed)   Microalbumin / creatinine urine ratio (Completed)     Genitourinary   CKD (chronic kidney disease) stage 2, GFR 60-89 ml/min    Repeat BMP: stable      Relevant Orders   Basic metabolic panel (Completed)     Other   Mixed hyperlipidemia (Chronic)    Repeat lipid panel Lipid Panel     Component Value Date/Time   CHOL 186 04/09/2021 1016   TRIG 175.0 (H) 04/09/2021 1016   HDL 36.40 (L) 04/09/2021 1016   CHOLHDL 5 04/09/2021 1016   VLDL 35.0 04/09/2021 1016   LDLCALC 115 (H) 04/09/2021 1016   LDLDIRECT 117.0 10/07/2020 0924   Current use of atorvastatin Advised about importance of daily exercise and maintaining DASH  diet      Relevant Medications   diltiazem (CARDIZEM CD) 180 MG 24 hr capsule   hydrochlorothiazide (MICROZIDE) 12.5 MG capsule   Other Relevant Orders   Lipid panel (Completed)   Other Visit Diagnoses     Prostate cancer screening       Relevant Orders   PSA, Medicare ( [place] only) (Completed)   Need for influenza vaccination       Relevant Orders   Flu Vaccine QUAD High Dose(Fluad) (Completed)       Follow-up: Return in about 6 months (around 10/07/2021) for DM and HTN, hyperlipidemia (fasting).  Wilfred Lacy, NP

## 2021-04-09 NOTE — Assessment & Plan Note (Addendum)
Repeat BMP: stable

## 2021-04-09 NOTE — Assessment & Plan Note (Addendum)
Advised about need for diabetic eye exam. Current use of metfomin Stable neuropathy with gabapentin LDL not at goal: current use of atorvastatin.  current use of olmesartan BP at goal  Repeat hgbA1c and urine micoalbumin: controlled at 6.6%, negative urine microalbumin

## 2021-04-09 NOTE — Assessment & Plan Note (Addendum)
Repeat lipid panel Lipid Panel     Component Value Date/Time   CHOL 186 04/09/2021 1016   TRIG 175.0 (H) 04/09/2021 1016   HDL 36.40 (L) 04/09/2021 1016   CHOLHDL 5 04/09/2021 1016   VLDL 35.0 04/09/2021 1016   LDLCALC 115 (H) 04/09/2021 1016   LDLDIRECT 117.0 10/07/2020 0924   Current use of atorvastatin Advised about importance of daily exercise and maintaining DASH diet

## 2021-04-09 NOTE — Assessment & Plan Note (Signed)
Under the care of Dr. Darcus Pester. Today he denies any chest pain or SOB with exertion. Current use of diltiazem, atorvastatin, lovaza and coQ10 Reports compliance with CPAP machine. Repeat lipid panel today.

## 2021-04-09 NOTE — Assessment & Plan Note (Addendum)
BP at goal with HCTZ, and benicar Furosemide used prn for LE edema  BP Readings from Last 3 Encounters:  04/09/21 (!) 106/54  01/14/21 123/77  10/07/20 118/70   Repeat BMP: stable maintain current medication doses

## 2021-04-11 MED ORDER — HYDROCHLOROTHIAZIDE 12.5 MG PO CAPS: 12.5000 mg | ORAL_CAPSULE | Freq: Every day | ORAL | 1 refills | Status: DC

## 2021-04-21 DIAGNOSIS — E119 Type 2 diabetes mellitus without complications: Secondary | ICD-10-CM | POA: Diagnosis not present

## 2021-04-21 DIAGNOSIS — Z7984 Long term (current) use of oral hypoglycemic drugs: Secondary | ICD-10-CM | POA: Diagnosis not present

## 2021-04-21 DIAGNOSIS — H43393 Other vitreous opacities, bilateral: Secondary | ICD-10-CM | POA: Diagnosis not present

## 2021-04-21 DIAGNOSIS — H52203 Unspecified astigmatism, bilateral: Secondary | ICD-10-CM | POA: Diagnosis not present

## 2021-04-21 DIAGNOSIS — H25043 Posterior subcapsular polar age-related cataract, bilateral: Secondary | ICD-10-CM | POA: Diagnosis not present

## 2021-04-21 DIAGNOSIS — H5203 Hypermetropia, bilateral: Secondary | ICD-10-CM | POA: Diagnosis not present

## 2021-04-21 DIAGNOSIS — D235 Other benign neoplasm of skin of trunk: Secondary | ICD-10-CM | POA: Diagnosis not present

## 2021-04-21 DIAGNOSIS — H04123 Dry eye syndrome of bilateral lacrimal glands: Secondary | ICD-10-CM | POA: Diagnosis not present

## 2021-04-21 DIAGNOSIS — H524 Presbyopia: Secondary | ICD-10-CM | POA: Diagnosis not present

## 2021-04-21 DIAGNOSIS — H2513 Age-related nuclear cataract, bilateral: Secondary | ICD-10-CM | POA: Diagnosis not present

## 2021-04-23 ENCOUNTER — Telehealth: Payer: Self-pay | Admitting: Nurse Practitioner

## 2021-04-24 ENCOUNTER — Other Ambulatory Visit: Payer: Self-pay | Admitting: Gastroenterology

## 2021-04-25 NOTE — Telephone Encounter (Signed)
Spoke with patient and he states he does not want brace. Closing note

## 2021-04-26 ENCOUNTER — Other Ambulatory Visit: Payer: Self-pay | Admitting: Nurse Practitioner

## 2021-04-26 DIAGNOSIS — I10 Essential (primary) hypertension: Secondary | ICD-10-CM

## 2021-04-27 ENCOUNTER — Other Ambulatory Visit: Payer: Self-pay | Admitting: Nurse Practitioner

## 2021-04-27 DIAGNOSIS — I10 Essential (primary) hypertension: Secondary | ICD-10-CM

## 2021-05-07 ENCOUNTER — Ambulatory Visit: Payer: Medicare Other | Admitting: Cardiology

## 2021-05-12 ENCOUNTER — Telehealth: Payer: Self-pay | Admitting: Nurse Practitioner

## 2021-05-14 NOTE — Telephone Encounter (Signed)
Spoke with pharmacy in 03/2021 and explained patient did not want this brace. Closing note

## 2021-05-21 DIAGNOSIS — Z23 Encounter for immunization: Secondary | ICD-10-CM | POA: Diagnosis not present

## 2021-05-22 DIAGNOSIS — Z20822 Contact with and (suspected) exposure to covid-19: Secondary | ICD-10-CM | POA: Diagnosis not present

## 2021-05-31 ENCOUNTER — Other Ambulatory Visit: Payer: Self-pay | Admitting: Cardiology

## 2021-06-20 ENCOUNTER — Other Ambulatory Visit: Payer: Self-pay | Admitting: Nurse Practitioner

## 2021-07-01 ENCOUNTER — Telehealth: Payer: Self-pay | Admitting: Adult Health

## 2021-07-01 DIAGNOSIS — G4733 Obstructive sleep apnea (adult) (pediatric): Secondary | ICD-10-CM

## 2021-07-01 NOTE — Telephone Encounter (Signed)
Received the following message vs patient scheduling:   "Appointment Request From: Andres Taylor   With Provider: Rexene Edison, NP [[facility]]   Preferred Date Range: Any   Preferred Times: Any Time   Reason for visit: Office Visit   Comments: [name], I don't need an office visit unless you think I do.  We were at a Wedding event in [place] and I left my cpap machine home by mistake.  I did not realize how dependent I am no to that machine.  I need a prescription for another one, I need to purchase one to keep in my car as we travel.  Also my wife is having some issues and she has a sleep study but it is in March.  Can you expedite that? [name] [birth year].  Your help is appreciated. Thanks, [name]"  I have sent a message to him to let him know that the local DME companies do not supply travel machines. Offered him the suggestions of [email or web address] or [email or web address]   Per his information in [employer], his current machine is set at auto cpap 5-15cm.  TP, would you be ok with [place] ordering a travel cpap for him?

## 2021-07-02 NOTE — Telephone Encounter (Signed)
Of course for the travel CPAP same settings   Can we check on Andres Taylor test that seems like a long time

## 2021-07-02 NOTE — Telephone Encounter (Signed)
Called the pt back and there was no answer- LMTCB   Regarding his spouse, she is not our patient and will be happy to schedule her a sleep consult here if she wants.

## 2021-07-02 NOTE — Telephone Encounter (Signed)
Pt is returning triage phone call and was put thru to me.

## 2021-07-03 NOTE — Telephone Encounter (Signed)
Called patient but he did not answer. Left message for him to call us back.  

## 2021-07-03 NOTE — Telephone Encounter (Signed)
Called and spoke with pt letting him know that we could send him Rx filled out so he could get sent to online cpap company for him to receive a travel cpap machine and he verbalized understanding. While speaking with pt, stated to him that since his spouse was not a current pt at our office that we could get her scheduled with one of our sleep providers for a consult and he verbalized understanding. Spouse had another consult scheduled with another office but that was not until March 2023. I was able to get pt's spouse scheduled for a consult with [facility] January 2023 so I cancelled the other appt. Nothing further needed.

## 2021-07-11 NOTE — Telephone Encounter (Signed)
Rx for pt's travel cpap was placed in the mail for him 12/22 but pt stated that he has not received it. Stated to him that I could reprint it and place it up front for him or try to mail it again for him and he said he would come by the office to pick it up. Stated to pt that we did close at noon today 12/30 and pt said he would be by before then. Nothing further needed.

## 2021-07-15 DIAGNOSIS — Z20822 Contact with and (suspected) exposure to covid-19: Secondary | ICD-10-CM | POA: Diagnosis not present

## 2021-07-21 ENCOUNTER — Other Ambulatory Visit: Payer: Self-pay | Admitting: Gastroenterology

## 2021-07-21 ENCOUNTER — Telehealth: Payer: Self-pay

## 2021-07-21 NOTE — Progress Notes (Signed)
I reach out to patient to schedule a CCM follow up with the Clinical pharmacist since it has been more then a year he has had appointment.  Patient agreed and Schedule a telephone follow up with the clinical pharmacist on 09/25/2021.  [place] Pharmacist Assistant [phone]

## 2021-07-23 DIAGNOSIS — Z20822 Contact with and (suspected) exposure to covid-19: Secondary | ICD-10-CM | POA: Diagnosis not present

## 2021-08-08 DIAGNOSIS — E1149 Type 2 diabetes mellitus with other diabetic neurological complication: Secondary | ICD-10-CM | POA: Diagnosis not present

## 2021-08-08 DIAGNOSIS — T161XXA Foreign body in right ear, initial encounter: Secondary | ICD-10-CM | POA: Diagnosis not present

## 2021-08-14 DIAGNOSIS — H25043 Posterior subcapsular polar age-related cataract, bilateral: Secondary | ICD-10-CM | POA: Diagnosis not present

## 2021-08-14 DIAGNOSIS — H5203 Hypermetropia, bilateral: Secondary | ICD-10-CM | POA: Diagnosis not present

## 2021-08-14 DIAGNOSIS — H16223 Keratoconjunctivitis sicca, not specified as Sjogren's, bilateral: Secondary | ICD-10-CM | POA: Diagnosis not present

## 2021-08-14 DIAGNOSIS — H5371 Glare sensitivity: Secondary | ICD-10-CM | POA: Diagnosis not present

## 2021-08-14 DIAGNOSIS — H524 Presbyopia: Secondary | ICD-10-CM | POA: Diagnosis not present

## 2021-08-14 DIAGNOSIS — H25811 Combined forms of age-related cataract, right eye: Secondary | ICD-10-CM | POA: Diagnosis not present

## 2021-08-14 DIAGNOSIS — H2513 Age-related nuclear cataract, bilateral: Secondary | ICD-10-CM | POA: Diagnosis not present

## 2021-08-14 DIAGNOSIS — H52203 Unspecified astigmatism, bilateral: Secondary | ICD-10-CM | POA: Diagnosis not present

## 2021-08-15 DIAGNOSIS — Z20822 Contact with and (suspected) exposure to covid-19: Secondary | ICD-10-CM | POA: Diagnosis not present

## 2021-08-21 DIAGNOSIS — Z20822 Contact with and (suspected) exposure to covid-19: Secondary | ICD-10-CM | POA: Diagnosis not present

## 2021-08-22 DIAGNOSIS — Z20822 Contact with and (suspected) exposure to covid-19: Secondary | ICD-10-CM | POA: Diagnosis not present

## 2021-09-02 DIAGNOSIS — Z20822 Contact with and (suspected) exposure to covid-19: Secondary | ICD-10-CM | POA: Diagnosis not present

## 2021-09-10 ENCOUNTER — Other Ambulatory Visit: Payer: Self-pay | Admitting: Nurse Practitioner

## 2021-09-10 DIAGNOSIS — H2511 Age-related nuclear cataract, right eye: Secondary | ICD-10-CM | POA: Diagnosis not present

## 2021-09-10 DIAGNOSIS — H25811 Combined forms of age-related cataract, right eye: Secondary | ICD-10-CM | POA: Diagnosis not present

## 2021-09-10 DIAGNOSIS — R053 Chronic cough: Secondary | ICD-10-CM

## 2021-09-11 ENCOUNTER — Other Ambulatory Visit: Payer: Self-pay | Admitting: Nurse Practitioner

## 2021-09-11 DIAGNOSIS — M5416 Radiculopathy, lumbar region: Secondary | ICD-10-CM

## 2021-09-15 DIAGNOSIS — Z20822 Contact with and (suspected) exposure to covid-19: Secondary | ICD-10-CM | POA: Diagnosis not present

## 2021-09-24 DIAGNOSIS — Z20822 Contact with and (suspected) exposure to covid-19: Secondary | ICD-10-CM | POA: Diagnosis not present

## 2021-09-25 ENCOUNTER — Telehealth: Payer: Medicare Other

## 2021-10-02 ENCOUNTER — Ambulatory Visit (INDEPENDENT_AMBULATORY_CARE_PROVIDER_SITE_OTHER): Payer: Medicare Other | Admitting: Adult Health

## 2021-10-02 ENCOUNTER — Encounter: Payer: Self-pay | Admitting: Adult Health

## 2021-10-02 ENCOUNTER — Other Ambulatory Visit: Payer: Self-pay

## 2021-10-02 DIAGNOSIS — G4733 Obstructive sleep apnea (adult) (pediatric): Secondary | ICD-10-CM | POA: Diagnosis not present

## 2021-10-02 DIAGNOSIS — Z9989 Dependence on other enabling machines and devices: Secondary | ICD-10-CM | POA: Diagnosis not present

## 2021-10-02 DIAGNOSIS — J309 Allergic rhinitis, unspecified: Secondary | ICD-10-CM | POA: Diagnosis not present

## 2021-10-02 DIAGNOSIS — H25042 Posterior subcapsular polar age-related cataract, left eye: Secondary | ICD-10-CM | POA: Diagnosis not present

## 2021-10-02 DIAGNOSIS — J4530 Mild persistent asthma, uncomplicated: Secondary | ICD-10-CM | POA: Diagnosis not present

## 2021-10-02 DIAGNOSIS — H2512 Age-related nuclear cataract, left eye: Secondary | ICD-10-CM | POA: Diagnosis not present

## 2021-10-02 DIAGNOSIS — Z20822 Contact with and (suspected) exposure to covid-19: Secondary | ICD-10-CM | POA: Diagnosis not present

## 2021-10-02 DIAGNOSIS — Z6838 Body mass index (BMI) 38.0-38.9, adult: Secondary | ICD-10-CM

## 2021-10-02 NOTE — Assessment & Plan Note (Addendum)
Appears under good control.  Continue on current regimen.  Trigger prevention. ?Asthma action plan discussed ? ?Plan  ?Patient Instructions  ?Keep up good work  ?Continue on CPAP At bedtime   ?Work on healthy weight loss.  ?Do not drive if sleepy .  ?Saline nasal rinses Twice daily   ?Saline nasal gel At bedtime   ?Continue on BREO 1 puff daily.  ?Albuterol inhaler As needed   ?Continue on Claritin and Zyrtec daily  ?Follow up in 1 year with Dr. Ander Slade or Seymore Brodowski NP and As needed   ?Please contact office for sooner follow up if symptoms do not improve or worsen or seek emergency care  ? ? ? ?  ? ?

## 2021-10-02 NOTE — Patient Instructions (Addendum)
Keep up good work  ?Continue on CPAP At bedtime   ?Work on healthy weight loss.  ?Do not drive if sleepy .  ?Saline nasal rinses Twice daily   ?Saline nasal gel At bedtime   ?Continue on BREO 1 puff daily.  ?Albuterol inhaler As needed   ?Continue on Claritin and Zyrtec daily  ?Follow up in 1 year with Dr. Ander Slade or Alencia Gordon NP and As needed   ?Please contact office for sooner follow up if symptoms do not improve or worsen or seek emergency care  ? ? ? ?

## 2021-10-02 NOTE — Assessment & Plan Note (Signed)
Excellent control and compliance on nocturnal CPAP ? ?Plan  ?Patient Instructions  ?Keep up good work  ?Continue on CPAP At bedtime   ?Work on healthy weight loss.  ?Do not drive if sleepy .  ?Saline nasal rinses Twice daily   ?Saline nasal gel At bedtime   ?Continue on BREO 1 puff daily.  ?Albuterol inhaler As needed   ?Continue on Claritin and Zyrtec daily  ?Follow up in 1 year with Dr. Ander Slade or Yanelle Sousa NP and As needed   ?Please contact office for sooner follow up if symptoms do not improve or worsen or seek emergency care  ? ? ? ?  ? ?

## 2021-10-02 NOTE — Progress Notes (Signed)
? ?'@Patient'$  ID: Andres Taylor, male    DOB: 1945/10/10, 76 y.o.   MRN: [id number] ? ?Chief Complaint  ?Patient presents with  ? Follow-up  ? ? ?Referring provider: ?[name], NP ? ?HPI: ?76 year old male former smoker followed for restrictive lung disease with asthma and obstructive sleep apnea, mild pulmonary hypertension ?Medical history significant for allergic rhinitis, obesity, diabetes, benign right upper lung mass status post resection in 2015 ? ?TEST/EVENTS :  ?Chest x-ray May 16, 2019 cardiomegaly, unchanged elevation of right hemidiaphragm ?  ?Echo October 2020 EF 60 to 65% , mildly elevated pulmonary artery systolic pressure ?  ?CT chest report 2014 showed some fibrotic changes. ? ?CT chest January 2015 2.7 cm nodule in the right upper lobe ?  ?Chest x-ray 2019 no acute process ?  ?Previous echo in 2014 showed moderate pulmonary hypertension however repeat echo last month April 21, 2019 showed mild pulmonary hypertension at most ? ?High-resolution CT chest November 2020 showed no evidence of fibrotic interstitial lung disease, bandlike scarring and atelectasis at the right lower lobe with elevation of the right hemidiaphragm similar to CT in 2015 consistent with sequela postinfectious process.,  Status post right upper lobe wedge resection. ?  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 No [employer] . No basement . No hot tubs . No travel .  ?Travels often. Last in [place] , [place], [place] 2018-2019.  ?Has a place in [place]. ? ?10/02/2021 Follow up : OSA , Asthma  ?Patient presents for a 1 year follow-up.  Patient has underlying obstructive sleep apnea.  Patient says he is doing well on CPAP.  Patient says he cannot sleep without his CPAP.  It has made a huge difference in his life.  He feels more rested and more energized since starting CPAP.  Patient says he usually wears his CPAP at least 8 hours each night.  CPAP download shows excellent  compliance with daily average usage around 9 hours each night.  Patient is on auto CPAP 5 to 15 cm H2O.  AHI is 2.8/hour.  Minimum leaks. ?Patient says he travels often.  Has a CPAP machine at his [place] home that he will use when he travels there.   ? ?Patient has underlying asthma.  Says overall his breathing's been doing well.  He remains on Breo daily.  He uses Singulair and Zyrtec.  Says his allergies have been under pretty good control.  Had no recent flare even with all the increased pollen outside. ?Patient says he remains very active.  He is currently moving and downsizing to a townhome. ?He denies any increased albuterol use.  He denies any chest pain, cough, congestion, hemoptysis or unintentional weight loss.  He is a former smoker.  Quit in 2004 ? ?Patient says he is fully retired now.  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  He is looking forward to travel ? ? ? ? ?No Known Allergies ? ?Immunization History  ?Administered Date(s) Administered  ? Fluad Quad(high Dose 65+) 07/03/2019, 04/09/2021  ? Influenza Whole 05/10/2007, 07/08/2010  ? Influenza,inj,Quad PF,6+ Mos 04/17/2018  ? Influenza-Unspecified 07/14/2011, 06/29/2013, 03/30/2014, 04/09/2015, 05/19/2016  ? PFIZER(Purple Top)SARS-COV-2 Vaccination 08/05/2019, 08/27/2019, 04/03/2020  ? Pneumococcal Conjugate-13 01/05/2012, 06/29/2013, 03/13/2014  ? Pneumococcal Polysaccharide-23 02/15/2013, 04/09/2015  ? Td 02/15/2013  ? Tdap 07/08/2010, 07/13/2013  ? Zoster, Live 07/21/2010, 02/15/2013  ? ? ?Past Medical History:  ?Diagnosis Date  ? Abdominal bloating 03/13/2019  ? Allergic rhinitis   ?  Allergy   ? Arthritis   ? Asthma   ? Blood in urine   ? Cataract   ? forming   ? Chronic cough   ? CKD (chronic kidney disease), stage II   ? DDD (degenerative disc disease)   ? Diabetes mellitus ([place])   ? DOE (dyspnea on exertion)   ? Eustachian tube dysfunction   ? Fatty liver   ? GERD (gastroesophageal reflux disease)   ? Glaucoma   ? Gout   ? 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 Heme positive stool 03/13/2019  ? History of cardiomegaly 05/16/2019  ? Noted on CXR  ? History of kidney stones   ? Hyperlipidemia   ? Hypertension   ? Inguinal hernia   ? Bilateral  ? Joint pain, knee   ? Leg edema   ? Lung tumor (benign), right   ? Right upper lobe lung mass  ? Obesity   ? OSA on CPAP   ? Pneumonitis   ? right lung  ? Pulmonary HTN ([place]) 04/18/2013  ? Restrictive lung disease   ? Sciatica   ? Spinal stenosis   ? Wears contact lenses   ? Wears glasses   ? Wears hearing aid in both ears   ? ? ?Tobacco History: ?Social History  ? ?Tobacco Use  ?Smoking Status Former  ? Packs/day: 1.50  ? Years: 42.00  ? Pack years: 63.00  ? Types: Cigarettes  ? Quit date: 07/13/2002  ? Years since quitting: 19.2  ?Smokeless Tobacco Former  ? ?Counseling given: Not Answered ? ? ?Outpatient Medications Prior to Visit  ?Medication Sig Dispense Refill  ? allopurinol (ZYLOPRIM) 100 MG tablet Take 1 tablet (100 mg total) by mouth daily. 90 tablet 3  ? aspirin EC 81 MG tablet Take 81 mg by mouth daily.    ? atorvastatin (LIPITOR) 10 MG tablet TAKE 1 TABLET(10 MG) BY MOUTH DAILY AT 6 PM 90 tablet 3  ? BREO ELLIPTA 200-25 MCG/ACT AEPB INHALE 1 PUFF INTO THE LUNGS DAILY 60 each 5  ? cetirizine (ZYRTEC) 10 MG chewable tablet Chew 10 mg by mouth daily.    ? Coenzyme Q-10 100 MG capsule Take 100 mg by mouth 2 (two) times daily.    ? colchicine 0.6 MG tablet Take 1 tablet (0.6 mg total) by mouth 2 (two) times daily as needed (for gout). PRN for Gout 6 tablet 1  ? diltiazem (CARDIZEM CD) 180 MG 24 hr capsule Take 180 mg by mouth daily.    ? furosemide (LASIX) 20 MG tablet TAKE 1 TABLET BY MOUTH AS NEEDED, DO NOT TAKE HCTZ THE DAY YOU USE MEDICATION AS NEEDED FOR SWELLING OR SHORTNESS OF BREATH 30 tablet 6  ? guaiFENesin (MUCINEX) 600 MG 12 hr tablet Take 600 mg by mouth daily.    ? hydrochlorothiazide (MICROZIDE) 12.5 MG capsule Take 1 capsule (12.5 mg total) by mouth daily. 90 capsule 1  ? ipratropium  (ATROVENT) 0.06 % nasal spray Place 2 sprays into both nostrils in the morning and at bedtime. 30 each 6  ? metFORMIN (GLUCOPHAGE) 850 MG tablet Take 1bid 180 tablet 3  ? methocarbamol (ROBAXIN) 500 MG tablet TAKE 1 TABLET(500 MG) BY MOUTH EVERY 12 HOURS 180 tablet 1  ? montelukast (SINGULAIR) 10 MG tablet TAKE 1 TABLET(10 MG) BY MOUTH AT BEDTIME 90 tablet 1  ? Multiple Vitamin (MULTIVITAMIN) tablet Take 1 tablet by mouth daily.    ? olmesartan (BENICAR) 20 MG tablet TAKE  1 TABLET(20 MG) BY MOUTH DAILY 90 tablet 3  ? omega-3 acid ethyl esters (LOVAZA) 1 g capsule TAKE 1 CAPSULE BY MOUTH DAILY 90 capsule 3  ? PROAIR RESPICLICK 314 (90 Base) MCG/ACT AEPB INHALE 2 PUFFS EVERY 4-6 HOURS AS NEEDED FOR COUGH/ WHEEZE 1 each 0  ? omeprazole (PRILOSEC) 40 MG capsule TAKE 1 CAPSULE(40 MG) BY MOUTH DAILY 30 capsule 0  ? ?No facility-administered medications prior to visit.  ? ? ? ?Review of Systems:  ? ?Constitutional:   No  weight loss, night sweats,  Fevers, chills,  ?+fatigue, or  lassitude. ? ?HEENT:   No headaches,  Difficulty swallowing,  Tooth/dental problems, or  Sore throat,  ?              No sneezing, itching, ear ache, nasal congestion, post nasal drip,  ? ?CV:  No chest pain,  Orthopnea, PND, swelling in lower extremities, anasarca, dizziness, palpitations, syncope.  ? ?GI  No heartburn, indigestion, abdominal pain, nausea, vomiting, diarrhea, change in bowel habits, loss of appetite, bloody stools.  ? ?Resp: No shortness of breath with exertion or at rest.  No excess mucus, no productive cough,  No non-productive cough,  No coughing up of blood.  No change in color of mucus.  No wheezing.  No chest wall deformity ? ?Skin: no rash or lesions. ? ?GU: no dysuria, change in color of urine, no urgency or frequency.  No flank pain, no hematuria  ? ?MS:  No joint pain or swelling.  No decreased range of motion.  No back pain. ? ? ? ?Physical Exam ? ?BP 120/60 (BP Location: Left Arm, Patient Position: Sitting, Cuff Size:  Large)   Pulse 82   Temp 97.8 ?F (36.6 ?C) (Oral)   Ht 5' 7.5" (1.715 m)   Wt 241 lb 3.2 oz (109.4 kg)   SpO2 95%   BMI 37.22 kg/m?  ? ?GEN: A/Ox3; pleasant , NAD, well nourished  ?  ?HEENT:  [place]/AT,  , NOSE-

## 2021-10-02 NOTE — Assessment & Plan Note (Signed)
Chronic allergies.  Currently controlled on Singulair and Zyrtec.  Trigger prevention discussed ? ?Plan  ?Patient Instructions  ?Keep up good work  ?Continue on CPAP At bedtime   ?Work on healthy weight loss.  ?Do not drive if sleepy .  ?Saline nasal rinses Twice daily   ?Saline nasal gel At bedtime   ?Continue on BREO 1 puff daily.  ?Albuterol inhaler As needed   ?Continue on Claritin and Zyrtec daily  ?Follow up in 1 year with Dr. Ander Slade or Jameika Kinn NP and As needed   ?Please contact office for sooner follow up if symptoms do not improve or worsen or seek emergency care  ? ? ? ? ' ? ?

## 2021-10-02 NOTE — Assessment & Plan Note (Signed)
Healthy weight loss discussed 

## 2021-10-07 ENCOUNTER — Encounter: Payer: Self-pay | Admitting: Nurse Practitioner

## 2021-10-07 ENCOUNTER — Telehealth (INDEPENDENT_AMBULATORY_CARE_PROVIDER_SITE_OTHER): Payer: Medicare Other | Admitting: Nurse Practitioner

## 2021-10-07 ENCOUNTER — Other Ambulatory Visit: Payer: Self-pay

## 2021-10-07 ENCOUNTER — Other Ambulatory Visit: Payer: Self-pay | Admitting: Adult Health

## 2021-10-07 ENCOUNTER — Other Ambulatory Visit (INDEPENDENT_AMBULATORY_CARE_PROVIDER_SITE_OTHER): Payer: Medicare Other

## 2021-10-07 VITALS — Ht 68.0 in | Wt 240.0 lb

## 2021-10-07 DIAGNOSIS — E785 Hyperlipidemia, unspecified: Secondary | ICD-10-CM | POA: Diagnosis not present

## 2021-10-07 DIAGNOSIS — E1169 Type 2 diabetes mellitus with other specified complication: Secondary | ICD-10-CM

## 2021-10-07 DIAGNOSIS — E790 Hyperuricemia without signs of inflammatory arthritis and tophaceous disease: Secondary | ICD-10-CM | POA: Diagnosis not present

## 2021-10-07 DIAGNOSIS — E782 Mixed hyperlipidemia: Secondary | ICD-10-CM

## 2021-10-07 DIAGNOSIS — M1A9XX0 Chronic gout, unspecified, without tophus (tophi): Secondary | ICD-10-CM

## 2021-10-07 DIAGNOSIS — I10 Essential (primary) hypertension: Secondary | ICD-10-CM

## 2021-10-07 DIAGNOSIS — I700 Atherosclerosis of aorta: Secondary | ICD-10-CM | POA: Insufficient documentation

## 2021-10-07 DIAGNOSIS — E1142 Type 2 diabetes mellitus with diabetic polyneuropathy: Secondary | ICD-10-CM

## 2021-10-07 LAB — LIPID PANEL
Cholesterol: 197 mg/dL (ref 0–200)
HDL: 41.6 mg/dL (ref >39.00)
LDL Cholesterol: 126 mg/dL — ABNORMAL HIGH (ref 0–99)
NonHDL: 155.56
Total CHOL/HDL Ratio: 5
Triglycerides: 150 mg/dL — ABNORMAL HIGH (ref 0.0–149.0)
VLDL: 30 mg/dL (ref 0.0–40.0)

## 2021-10-07 LAB — BASIC METABOLIC PANEL
BUN: 18 mg/dL (ref 6–23)
CO2: 28 mEq/L (ref 19–32)
Calcium: 9.5 mg/dL (ref 8.4–10.5)
Chloride: 99 mEq/L (ref 96–112)
Creatinine, Ser: 1.12 mg/dL (ref 0.40–1.50)
GFR: 63.54 mL/min (ref >60.00)
Glucose, Bld: 152 mg/dL — ABNORMAL HIGH (ref 70–99)
Potassium: 4.1 mEq/L (ref 3.5–5.1)
Sodium: 136 mEq/L (ref 135–145)

## 2021-10-07 LAB — HEMOGLOBIN A1C: Hgb A1c MFr Bld: 6.7 % — ABNORMAL HIGH (ref 4.6–6.5)

## 2021-10-07 LAB — URIC ACID: Uric Acid, Serum: 7.2 mg/dL (ref 4.0–7.8)

## 2021-10-07 MED ORDER — HYDROCHLOROTHIAZIDE 12.5 MG PO CAPS: 12.5000 mg | ORAL_CAPSULE | Freq: Every day | ORAL | 1 refills | Status: DC

## 2021-10-07 MED ORDER — ATORVASTATIN CALCIUM 10 MG PO TABS: ORAL_TABLET | ORAL | 3 refills | Status: DC

## 2021-10-07 MED ORDER — ALLOPURINOL 100 MG PO TABS: 100.0000 mg | ORAL_TABLET | Freq: Every day | ORAL | 3 refills | Status: DC

## 2021-10-07 NOTE — Progress Notes (Addendum)
Virtual Visit via Video Note ? ?I connected withNAME@ on 10/09/21 at  8:00 AM EDT by a video enabled telemedicine application and verified that I am speaking with the correct person using two identifiers. ? ?Location: ?Patient:Home ?Provider: Office ?Participants: patient and provider ? ?I discussed the limitations of evaluation and management by telemedicine and the availability of in person appointments. ?I also discussed with the patient that there may be a patient responsible charge related to this service. The patient expressed understanding and agreed to proceed. ? ?CC:42month f/up for DM, HTN and hyperlipidemia ? ?History of Present Illness: ? Hyperuricemia ?No acute gout exacerbation in last 631month?Current use of allopurinol, maintain dose ?Repeat uric acid and BMP: normal ? ?Scarring of lung ?CT chest done 05/2019: Bandlike scarring or atelectasis of the right lower lobe with elevation of the right hemidiaphragm, generally similar in appearance to prior CT dated 2015 and consistent with sequelae of infection or aspiration.Status post right upper lobe wedge resection.  ?[facility] ? ?HTN (hypertension), benign ?BP at goal with olmesartan and HCTZ ?BP Readings from Last 3 Encounters:  ?10/02/21 120/60  ?04/09/21 (!) 106/54  ?01/14/21 123/77  ? ?Maintain med doses ?Repeat BMP ? ?DM (diabetes mellitus), type 2 ([place]?Fasting glucose at home:130s ?No adverse effect with metformin ?Up to date with eye exam, s/p cataract removal without complication. ?Normal urine microalbumin ?Repeat hgbA1c and BMP: controlled ?Maintain med dose ? ?Hyperlipidemia associated with type 2 diabetes mellitus ([place]?No adverse effects with atorvastatin ?Repeat lipid panel: LDL not at goal ?Maintain lovaza dose ?Increase atorvastatin to '20mg'$  ?Repeat lipid panel in 32m28monthasting)  ?Observations/Objective: ?Alert and oriented x4 ?Limited exam due to video call. ? ?Assessment and  Plan: ?MurCardins seen today for follow-up. ? ?Diagnoses and all orders for this visit: ? ?Type 2 diabetes mellitus with other specified complication, without long-term current use of insulin ([facility]-     Basic metabolic panel ?-     Hemoglobin A1c ? ?HTN (hypertension), benign ?-     Basic metabolic panel ?-     hydrochlorothiazide (MICROZIDE) 12.5 MG capsule; Take 1 capsule (12.5 mg total) by mouth daily. ? ?Hyperlipidemia associated with type 2 diabetes mellitus ([place]-     Lipid panel ?-     Lipid panel; Future ? ?Hyperuricemia ?-     Uric acid ? ?Mixed hyperlipidemia ?-     Discontinue: atorvastatin (LIPITOR) 10 MG tablet; TAKE 1 TABLET(10 MG) BY MOUTH DAILY AT 6 PM ?-     atorvastatin (LIPITOR) 20 MG tablet; Take 1 tablet (20 mg total) by mouth daily. ? ?Chronic gout without tophus, unspecified cause, unspecified site ?-     allopurinol (ZYLOPRIM) 100 MG tablet; Take 1 tablet (100 mg total) by mouth daily. ? ?Type 2 diabetes mellitus with diabetic polyneuropathy, without long-term current use of insulin (HCC) ?-     metFORMIN (GLUCOPHAGE) 850 MG tablet; Take 1bid ? ? ?Follow Up Instructions: ?See instructions above ?  ?I discussed the assessment and treatment plan with the patient. The patient was provided an opportunity to ask questions and all were answered. The patient agreed with the plan and demonstrated an understanding of the instructions. ?  ?The patient was advised to call back or seek an in-person evaluation if the symptoms worsen or if the condition fails to improve as anticipated. ? ?ChaWilfred LacyP  ?

## 2021-10-07 NOTE — Assessment & Plan Note (Addendum)
BP at goal with olmesartan and HCTZ ?BP Readings from Last 3 Encounters:  ?10/02/21 120/60  ?04/09/21 (!) 106/54  ?01/14/21 123/77  ? ?Maintain med doses ?Repeat BMP ?

## 2021-10-07 NOTE — Assessment & Plan Note (Addendum)
No acute gout exacerbation in last 51month ?Current use of allopurinol, maintain dose ?Repeat uric acid and BMP: normal ?

## 2021-10-07 NOTE — Progress Notes (Signed)
Per the orders of Wilfred Lacy NP pt is here for fasting labs, pt tolerated draw well.  ?

## 2021-10-07 NOTE — Assessment & Plan Note (Addendum)
Fasting glucose at home:130s ?No adverse effect with metformin ?Up to date with eye exam, s/p cataract removal without complication. ?Normal urine microalbumin ?Repeat hgbA1c and BMP: controlled ?Maintain med dose ?

## 2021-10-07 NOTE — Assessment & Plan Note (Addendum)
No adverse effects with atorvastatin ?Repeat lipid panel: LDL not at goal ?Maintain lovaza dose ?Increase atorvastatin to '20mg'$  ?Repeat lipid panel in 58month(fasting) ?

## 2021-10-07 NOTE — Assessment & Plan Note (Addendum)
CT chest done 05/2019: Bandlike scarring or atelectasis of the right lower lobe with elevation of the right hemidiaphragm, generally similar in appearance to prior CT dated 2015 and consistent with sequelae of infection or aspiration.Status post right upper lobe wedge resection.  ?Followed by [facility], reviewed 10/02/2021 OV note ?

## 2021-10-08 DIAGNOSIS — H25812 Combined forms of age-related cataract, left eye: Secondary | ICD-10-CM | POA: Diagnosis not present

## 2021-10-08 DIAGNOSIS — H2512 Age-related nuclear cataract, left eye: Secondary | ICD-10-CM | POA: Diagnosis not present

## 2021-10-09 MED ORDER — METFORMIN HCL 850 MG PO TABS: ORAL_TABLET | ORAL | 3 refills | Status: DC

## 2021-10-09 MED ORDER — ATORVASTATIN CALCIUM 20 MG PO TABS: 20.0000 mg | ORAL_TABLET | Freq: Every day | ORAL | 3 refills | Status: DC

## 2021-10-09 NOTE — Addendum Note (Signed)
Addended by: Leana Gamer on: 10/09/2021 02:22 PM ? ? Modules accepted: Orders ? ?

## 2021-10-10 DIAGNOSIS — Z20822 Contact with and (suspected) exposure to covid-19: Secondary | ICD-10-CM | POA: Diagnosis not present

## 2021-10-14 ENCOUNTER — Ambulatory Visit (INDEPENDENT_AMBULATORY_CARE_PROVIDER_SITE_OTHER): Payer: Medicare Other

## 2021-10-14 DIAGNOSIS — Z0000 Encounter for general adult medical examination without abnormal findings: Secondary | ICD-10-CM | POA: Diagnosis not present

## 2021-10-14 NOTE — Patient Instructions (Signed)
Andres Taylor , ?Thank you for taking time to come for your Medicare Wellness Visit. I appreciate your ongoing commitment to your health goals. Please review the following plan we discussed and let me know if I can assist you in the future.  ? ?Screening recommendations/referrals: ?Colonoscopy: no longer required  ?Recommended yearly ophthalmology/optometry visit for glaucoma screening and checkup ?Recommended yearly dental visit for hygiene and checkup ? ?Vaccinations: ?Influenza vaccine: completed  ?Pneumococcal vaccine: completed  ?Tdap vaccine: 07/13/2013 ?Shingles vaccine: will consider    ? ?Advanced directives: none  ? ?Conditions/risks identified: none  ? ?Next appointment: none  ? ?Preventive Care 77 Years and Older, Male ?Preventive care refers to lifestyle choices and visits with your health care provider that can promote health and wellness. ?What does preventive care include? ?A yearly physical exam. This is also called an annual well check. ?Dental exams once or twice a year. ?Routine eye exams. Ask your health care provider how often you should have your eyes checked. ?Personal lifestyle choices, including: ?Daily care of your teeth and gums. ?Regular physical activity. ?Eating a healthy diet. ?Avoiding tobacco and drug use. ?Limiting alcohol use. ?Practicing safe sex. ?Taking low doses of aspirin every day. ?Taking vitamin and mineral supplements as recommended by your health care provider. ?What happens during an annual well check? ?The services and screenings done by your health care provider during your annual well check will depend on your age, overall health, lifestyle risk factors, and family history of disease. ?Counseling  ?Your health care provider may ask you questions about your: ?Alcohol use. ?Tobacco use. ?Drug use. ?Emotional well-being. ?Home and relationship well-being. ?Sexual activity. ?Eating habits. ?History of falls. ?Memory and ability to understand (cognition). ?Work and work  Statistician. ?Screening  ?You may have the following tests or measurements: ?Height, weight, and BMI. ?Blood pressure. ?Lipid and cholesterol levels. These may be checked every 5 years, or more frequently if you are over 52 years old. ?Skin check. ?Lung cancer screening. You may have this screening every year starting at age 83 if you have a 30-pack-year history of smoking and currently smoke or have quit within the past 15 years. ?Fecal occult blood test (FOBT) of the stool. You may have this test every year starting at age 106. ?Flexible sigmoidoscopy or colonoscopy. You may have a sigmoidoscopy every 5 years or a colonoscopy every 10 years starting at age 33. ?Prostate cancer screening. Recommendations will vary depending on your family history and other risks. ?Hepatitis C blood test. ?Hepatitis B blood test. ?Sexually transmitted disease (STD) testing. ?Diabetes screening. This is done by checking your blood sugar (glucose) after you have not eaten for a while (fasting). You may have this done every 1-3 years. ?Abdominal aortic aneurysm (AAA) screening. You may need this if you are a current or former smoker. ?Osteoporosis. You may be screened starting at age 40 if you are at high risk. ?Talk with your health care provider about your test results, treatment options, and if necessary, the need for more tests. ?Vaccines  ?Your health care provider may recommend certain vaccines, such as: ?Influenza vaccine. This is recommended every year. ?Tetanus, diphtheria, and acellular pertussis (Tdap, Td) vaccine. You may need a Td booster every 10 years. ?Zoster vaccine. You may need this after age 61. ?Pneumococcal 13-valent conjugate (PCV13) vaccine. One dose is recommended after age 62. ?Pneumococcal polysaccharide (PPSV23) vaccine. One dose is recommended after age 17. ?Talk to your health care provider about which screenings and vaccines you need and  how often you need them. ?This information is not intended to replace  advice given to you by your health care provider. Make sure you discuss any questions you have with your health care provider. ?Document Released: 07/26/2015 Document Revised: 03/18/2016 Document Reviewed: 04/30/2015 ?Elsevier Interactive Patient Education ? 2017 [place]. ? ?Fall Prevention in the Home ?Falls can cause injuries. They can happen to people of all ages. There are many things you can do to make your home safe and to help prevent falls. ?What can I do on the outside of my home? ?Regularly fix the edges of walkways and driveways and fix any cracks. ?Remove anything that might make you trip as you walk through a door, such as a raised step or threshold. ?Trim any bushes or trees on the path to your home. ?Use bright outdoor lighting. ?Clear any walking paths of anything that might make someone trip, such as rocks or tools. ?Regularly check to see if handrails are loose or broken. Make sure that both sides of any steps have handrails. ?Any raised decks and porches should have guardrails on the edges. ?Have any leaves, snow, or ice cleared regularly. ?Use sand or salt on walking paths during winter. ?Clean up any spills in your garage right away. This includes oil or grease spills. ?What can I do in the bathroom? ?Use night lights. ?Install grab bars by the toilet and in the tub and shower. Do not use towel bars as grab bars. ?Use non-skid mats or decals in the tub or shower. ?If you need to sit down in the shower, use a plastic, non-slip stool. ?Keep the floor dry. Clean up any water that spills on the floor as soon as it happens. ?Remove soap buildup in the tub or shower regularly. ?Attach bath mats securely with double-sided non-slip rug tape. ?Do not have throw rugs and other things on the floor that can make you trip. ?What can I do in the bedroom? ?Use night lights. ?Make sure that you have a light by your bed that is easy to reach. ?Do not use any sheets or blankets that are too big for your bed.  They should not hang down onto the floor. ?Have a firm chair that has side arms. You can use this for support while you get dressed. ?Do not have throw rugs and other things on the floor that can make you trip. ?What can I do in the kitchen? ?Clean up any spills right away. ?Avoid walking on wet floors. ?Keep items that you use a lot in easy-to-reach places. ?If you need to reach something above you, use a strong step stool that has a grab bar. ?Keep electrical cords out of the way. ?Do not use floor polish or wax that makes floors slippery. If you must use wax, use non-skid floor wax. ?Do not have throw rugs and other things on the floor that can make you trip. ?What can I do with my stairs? ?Do not leave any items on the stairs. ?Make sure that there are handrails on both sides of the stairs and use them. Fix handrails that are broken or loose. Make sure that handrails are as long as the stairways. ?Check any carpeting to make sure that it is firmly attached to the stairs. Fix any carpet that is loose or worn. ?Avoid having throw rugs at the top or bottom of the stairs. If you do have throw rugs, attach them to the floor with carpet tape. ?Make sure that you have  a light switch at the top of the stairs and the bottom of the stairs. If you do not have them, ask someone to add them for you. ?What else can I do to help prevent falls? ?Wear shoes that: ?Do not have high heels. ?Have rubber bottoms. ?Are comfortable and fit you well. ?Are closed at the toe. Do not wear sandals. ?If you use a stepladder: ?Make sure that it is fully opened. Do not climb a closed stepladder. ?Make sure that both sides of the stepladder are locked into place. ?Ask someone to hold it for you, if possible. ?Clearly mark and make sure that you can see: ?Any grab bars or handrails. ?First and last steps. ?Where the edge of each step is. ?Use tools that help you move around (mobility aids) if they are needed. These  include: ?Canes. ?Walkers. ?Scooters. ?Crutches. ?Turn on the lights when you go into a dark area. Replace any light bulbs as soon as they burn out. ?Set up your furniture so you have a clear path. Avoid moving your furniture around

## 2021-10-14 NOTE — Progress Notes (Signed)
? ?Subjective:  ? Andres Taylor is a 76 y.o. male who presents for an subsequent Medicare Annual Wellness Visit. ? ?I connected with Andres Taylor today by telephone and verified that I am speaking with the correct person using two identifiers. ?Location patient: home ?Location provider: work ?Persons participating in the virtual visit: patient, provider. ?  ?I discussed the limitations, risks, security and privacy concerns of performing an evaluation and management service by telephone and the availability of in person appointments. I also discussed with the patient that there may be a patient responsible charge related to this service. The patient expressed understanding and verbally consented to this telephonic visit.  ?  ?Interactive audio and video telecommunications were attempted between this provider and patient, however failed, due to patient having technical difficulties OR patient did not have access to video capability.  We continued and completed visit with audio only. ? ?  ?Review of Systems    ? ?Cardiac Risk Factors include: advanced age (>61mn, >>24women);diabetes mellitus;male gender ? ?   ?Objective:  ?  ?Today's Vitals  ? ?There is no height or weight on file to calculate BMI. ? ? ?  10/14/2021  ?  9:54 AM 01/14/2021  ? 11:05 AM 10/08/2020  ?  9:50 AM 08/07/2019  ? 10:00 AM 05/16/2019  ?  4:23 PM 08/30/2013  ? 12:21 PM  ?Advanced Directives  ?Does Patient Have a Medical Advance Directive? Yes Yes Yes Yes No Patient has advance directive, copy not in chart  ?Type of AParamedicof ASan LuisLiving will HWillitsLiving will HLafayetteLiving will HBartlettLiving will  Living will;Healthcare Power of Attorney  ?Does patient want to make changes to medical advance directive?    No - Patient declined  No change requested  ?Copy of [place] Chart? No - copy requested  No - copy requested No - copy  requested  Copy requested from family  ?Pre-existing out of facility DNR order (yellow form or pink MOST form)      No  ? ? ?Current Medications (verified) ?Outpatient Encounter Medications as of 10/14/2021  ?Medication Sig  ? allopurinol (ZYLOPRIM) 100 MG tablet Take 1 tablet (100 mg total) by mouth daily.  ? aspirin EC 81 MG tablet Take 81 mg by mouth daily.  ? atorvastatin (LIPITOR) 20 MG tablet Take 1 tablet (20 mg total) by mouth daily.  ? BREO ELLIPTA 200-25 MCG/ACT AEPB INHALE 1 PUFF INTO THE LUNGS DAILY  ? cetirizine (ZYRTEC) 10 MG chewable tablet Chew 10 mg by mouth daily.  ? Coenzyme Q-10 100 MG capsule Take 100 mg by mouth 2 (two) times daily.  ? colchicine 0.6 MG tablet Take 1 tablet (0.6 mg total) by mouth 2 (two) times daily as needed (for gout). PRN for Gout  ? diltiazem (CARDIZEM CD) 180 MG 24 hr capsule Take 180 mg by mouth daily.  ? furosemide (LASIX) 20 MG tablet TAKE 1 TABLET BY MOUTH AS NEEDED, DO NOT TAKE HCTZ THE DAY YOU USE MEDICATION AS NEEDED FOR SWELLING OR SHORTNESS OF BREATH  ? guaiFENesin (MUCINEX) 600 MG 12 hr tablet Take 600 mg by mouth daily.  ? hydrochlorothiazide (MICROZIDE) 12.5 MG capsule Take 1 capsule (12.5 mg total) by mouth daily.  ? ipratropium (ATROVENT) 0.06 % nasal spray USE 2 SPRAYS IN BOTH  NOSTRILS IN THE MORNING AND AT BEDTIME  ? metFORMIN (GLUCOPHAGE) 850 MG tablet Take 1bid  ? methocarbamol (ROBAXIN)  500 MG tablet TAKE 1 TABLET(500 MG) BY MOUTH EVERY 12 HOURS  ? montelukast (SINGULAIR) 10 MG tablet TAKE 1 TABLET(10 MG) BY MOUTH AT BEDTIME  ? Multiple Vitamin (MULTIVITAMIN) tablet Take 1 tablet by mouth daily.  ? olmesartan (BENICAR) 20 MG tablet TAKE 1 TABLET(20 MG) BY MOUTH DAILY  ? omega-3 acid ethyl esters (LOVAZA) 1 g capsule TAKE 1 CAPSULE BY MOUTH DAILY  ? PROAIR RESPICLICK 939 (90 Base) MCG/ACT AEPB INHALE 2 PUFFS EVERY 4-6 HOURS AS NEEDED FOR COUGH/ WHEEZE  ? ?No facility-administered encounter medications on file as of 10/14/2021.  ? ? ?Allergies  (verified) ?Patient has no known allergies.  ? ?History: ?Past Medical History:  ?Diagnosis Date  ? Abdominal bloating 03/13/2019  ? Allergic rhinitis   ? Allergy   ? Arthritis   ? Asthma   ? Blood in urine   ? Cataract   ? forming   ? Chronic cough   ? CKD (chronic kidney disease), stage II   ? DDD (degenerative disc disease)   ? Diabetes mellitus ([place])   ? DOE (dyspnea on exertion)   ? Eustachian tube dysfunction   ? Fatty liver   ? GERD (gastroesophageal reflux disease)   ? Glaucoma   ? Gout   ? 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 Heme positive stool 03/13/2019  ? History of cardiomegaly 05/16/2019  ? Noted on CXR  ? History of kidney stones   ? Hyperlipidemia   ? Hypertension   ? Inguinal hernia   ? Bilateral  ? Joint pain, knee   ? Leg edema   ? Lung tumor (benign), right   ? Right upper lobe lung mass  ? Obesity   ? OSA on CPAP   ? Pneumonitis   ? right lung  ? Pulmonary HTN ([place]) 04/18/2013  ? Restrictive lung disease   ? Sciatica   ? Spinal stenosis   ? Wears contact lenses   ? Wears glasses   ? Wears hearing aid in both ears   ? ?Past Surgical History:  ?Procedure Laterality Date  ? APPENDECTOMY  1976  ? COLONOSCOPY  08/2010  ? COLONOSCOPY  08/07/2019  ? [place]  2001  ? INGUINAL HERNIA REPAIR Left 1948  ? as a baby 18 months old  ? INGUINAL HERNIA REPAIR Bilateral 09/21/2019  ? Procedure: LAPAROSCOPIC BILATERAL INGUINAL HERNIA REPAIR, LYSIS OF ADHESIONS;  Surgeon: Michael Boston, MD;  Location: [facility];  Service: General;  Laterality: Bilateral;  ? LEFT AND RIGHT HEART CATHETERIZATION WITH CORONARY ANGIOGRAM N/A 04/20/2013  ? Procedure: LEFT AND RIGHT HEART CATHETERIZATION WITH CORONARY ANGIOGRAM;  Surgeon: Leonie Man, MD;  Location: [facility] CATH LAB;  Angiographically normal Coronary Arteries - L Dom Cx (OM1, small LPL1 then LPL2 & PDA) & wraparound LAD (D1-3 small).  Borderline CO/CI - 4.94/2.16 by both Fick & TD.  RAP ~14 mmHg, RVEDP 15 mmHg.  LVEDP 19 mmHg (->diastolic  dysfunction) PCWP 16 mmHg, but PA Mean 25 mmHg  ? LITHOTRIPSY    ? x 2  ? LOBECTOMY Right   ? lumbar disectomy L4-5  1980  ? NM MYOVIEW LTD  06/15/2019  ?  EF 70%.  No ischemia or infarction.  LOW RISK   ? TRANSTHORACIC ECHOCARDIOGRAM  04/21/2019  ? EF 60 to 65%.  Mild LVH.  Normal RV.  Normal atrial size.  Mild aortic sclerosis.  No stenosis. Lipomatous intra-atrial septum  ? UPPER GI ENDOSCOPY  08/07/2019  ?  VIDEO ASSISTED THORACOSCOPY (VATS)/WEDGE RESECTION Right 08/30/2013  ? Procedure: VIDEO ASSISTED THORACOSCOPY (VATS)/WEDGE RESECTION;  Surgeon: Melrose Nakayama, MD;  Location: [facility];  Service: Thoracic;  Laterality: Right;  ? ?Family History  ?Problem Relation Age of Onset  ? Leukemia Father   ? Breast cancer Maternal Grandmother   ? Cancer Maternal Grandmother   ?     breast cancer  ? Stroke Maternal Grandmother   ? Hypertension Maternal Grandfather   ? Stroke Maternal Grandfather   ? Stroke Mother   ? Cancer Son   ?     Ewing's sarcoma  ? Heart failure Son   ?     Related to Adriamycin toxicity.  Had 2 consecutive heart transplants  ? AAA (abdominal aortic aneurysm) Son   ? Stroke Paternal Grandmother   ? Colon cancer Neg Hx   ? Esophageal cancer Neg Hx   ? Colon polyps Neg Hx   ? Rectal cancer Neg Hx   ? Stomach cancer Neg Hx   ? ?Social History  ? ?Socioeconomic History  ? Marital status: Married  ?  Spouse name: Not on file  ? Number of children: 3  ? Years of education: Not on file  ? Highest education level: High school graduate  ?Occupational History  ? Occupation: Engineer, site  ?  Employer: [employer]  ?Tobacco Use  ? Smoking status: Former  ?  Packs/day: 1.50  ?  Years: 42.00  ?  Pack years: 63.00  ?  Types: Cigarettes  ?  Quit date: 07/13/2002  ?  Years since quitting: 19.2  ? Smokeless tobacco: Former  ?Vaping Use  ? Vaping Use: Never used  ?Substance and Sexual Activity  ? Alcohol use: Yes  ?  Alcohol/week: 3.0 standard drinks  ?  Types: 3 Cans of beer per week  ?  Comment: 3  tmes weekly  ? Drug use: No  ? Sexual activity: Not on file  ?Other Topics Concern  ? Not on file  ?Social History Narrative  ? He and his first wife divorced in 20 14/2015.  He is now remarried and lives with his new wife.  ? H

## 2021-10-16 DIAGNOSIS — R059 Cough, unspecified: Secondary | ICD-10-CM | POA: Diagnosis not present

## 2021-10-16 DIAGNOSIS — R051 Acute cough: Secondary | ICD-10-CM | POA: Diagnosis not present

## 2021-10-16 DIAGNOSIS — Z20822 Contact with and (suspected) exposure to covid-19: Secondary | ICD-10-CM | POA: Diagnosis not present

## 2021-10-22 DIAGNOSIS — Z20822 Contact with and (suspected) exposure to covid-19: Secondary | ICD-10-CM | POA: Diagnosis not present

## 2021-10-24 DIAGNOSIS — Z20822 Contact with and (suspected) exposure to covid-19: Secondary | ICD-10-CM | POA: Diagnosis not present

## 2021-10-27 DIAGNOSIS — Z20822 Contact with and (suspected) exposure to covid-19: Secondary | ICD-10-CM | POA: Diagnosis not present

## 2021-10-30 ENCOUNTER — Other Ambulatory Visit: Payer: Self-pay | Admitting: Nurse Practitioner

## 2021-10-30 DIAGNOSIS — E782 Mixed hyperlipidemia: Secondary | ICD-10-CM

## 2021-11-03 ENCOUNTER — Telehealth: Payer: Self-pay | Admitting: Nurse Practitioner

## 2021-11-03 NOTE — Telephone Encounter (Signed)
Caller Name: ermine spofford ?Call back phone #: [phone] ? ?MEDICATION(S): diltiazem (CARDIZEM CD) 180 MG 24 hr capsule [Pharmacy Med Name: DILTIAZEM CD '180MG'$  CAPSULES (24 HR)] [[id number]]  ? ?omega-3 acid ethyl esters (LOVAZA) 1 g capsule [Pharmacy Med Name: OMEGA-3-ACID 1GM CAPSULES (RX)] [[id number]]  ? ? ? ? ?Days of Med Remaining:  ? ?Has the patient contacted their pharmacy (YES/NO)?  yes ?IF YES, when and what did the pharmacy advise? Contact your pcp ?IF NO, request that the patient contact the pharmacy for the refills in the future.  ?           The pharmacy will send an electronic request (except for controlled medications). ? ?Preferred Pharmacy: [id number] [address], [address] AT [facility] [address]  ?[place], [place] [address]  ?Phone:  [phone]  Fax:  [phone]  ?DEA #:  [id number] ? ?~~~Please advise patient/caregiver to allow 2-3 business days to process RX refills. ? ?

## 2021-11-04 DIAGNOSIS — Z20822 Contact with and (suspected) exposure to covid-19: Secondary | ICD-10-CM | POA: Diagnosis not present

## 2021-11-04 NOTE — Telephone Encounter (Signed)
I do not see where you have ever filled the Cardizem, are you willing to fill this? ?

## 2021-11-04 NOTE — Telephone Encounter (Signed)
Duplicate request

## 2021-11-07 DIAGNOSIS — Z20822 Contact with and (suspected) exposure to covid-19: Secondary | ICD-10-CM | POA: Diagnosis not present

## 2021-11-10 DIAGNOSIS — Z20822 Contact with and (suspected) exposure to covid-19: Secondary | ICD-10-CM | POA: Diagnosis not present

## 2021-11-14 DIAGNOSIS — Z20822 Contact with and (suspected) exposure to covid-19: Secondary | ICD-10-CM | POA: Diagnosis not present

## 2021-11-16 DIAGNOSIS — Z20822 Contact with and (suspected) exposure to covid-19: Secondary | ICD-10-CM | POA: Diagnosis not present

## 2021-11-17 DIAGNOSIS — Z20822 Contact with and (suspected) exposure to covid-19: Secondary | ICD-10-CM | POA: Diagnosis not present

## 2021-11-17 DIAGNOSIS — Z23 Encounter for immunization: Secondary | ICD-10-CM | POA: Diagnosis not present

## 2021-11-20 DIAGNOSIS — Z20822 Contact with and (suspected) exposure to covid-19: Secondary | ICD-10-CM | POA: Diagnosis not present

## 2021-12-05 ENCOUNTER — Other Ambulatory Visit: Payer: Self-pay

## 2021-12-05 DIAGNOSIS — M5416 Radiculopathy, lumbar region: Secondary | ICD-10-CM

## 2021-12-05 DIAGNOSIS — J4530 Mild persistent asthma, uncomplicated: Secondary | ICD-10-CM

## 2021-12-05 DIAGNOSIS — I10 Essential (primary) hypertension: Secondary | ICD-10-CM

## 2021-12-05 MED ORDER — BREO ELLIPTA 200-25 MCG/ACT IN AEPB: 1.0000 | INHALATION_SPRAY | Freq: Every day | RESPIRATORY_TRACT | 4 refills | Status: DC

## 2021-12-05 MED ORDER — DILTIAZEM HCL ER COATED BEADS 180 MG PO CP24: ORAL_CAPSULE | ORAL | 2 refills | Status: DC

## 2021-12-05 MED ORDER — METHOCARBAMOL 500 MG PO TABS: ORAL_TABLET | ORAL | 1 refills | Status: DC

## 2021-12-05 NOTE — Telephone Encounter (Signed)
Pt request all medications be sent through mail order pharmacy. Updated chart and medication refills sent in.

## 2021-12-09 ENCOUNTER — Other Ambulatory Visit: Payer: Self-pay | Admitting: Nurse Practitioner

## 2021-12-09 DIAGNOSIS — E1142 Type 2 diabetes mellitus with diabetic polyneuropathy: Secondary | ICD-10-CM

## 2021-12-11 ENCOUNTER — Other Ambulatory Visit: Payer: Self-pay

## 2022-01-22 DIAGNOSIS — T161XXA Foreign body in right ear, initial encounter: Secondary | ICD-10-CM | POA: Diagnosis not present

## 2022-01-27 ENCOUNTER — Other Ambulatory Visit: Payer: Self-pay | Admitting: Nurse Practitioner

## 2022-01-27 DIAGNOSIS — I10 Essential (primary) hypertension: Secondary | ICD-10-CM

## 2022-01-27 NOTE — Telephone Encounter (Signed)
Chart supports Rx Last OV: 09/2021 VV Next OV: 10/2022

## 2022-02-07 ENCOUNTER — Other Ambulatory Visit: Payer: Self-pay | Admitting: Nurse Practitioner

## 2022-02-07 DIAGNOSIS — I10 Essential (primary) hypertension: Secondary | ICD-10-CM

## 2022-02-09 NOTE — Telephone Encounter (Signed)
Chart supports Rx Last OV: 09/2021 Next OV: 10/2022

## 2022-03-09 ENCOUNTER — Other Ambulatory Visit: Payer: Self-pay | Admitting: Nurse Practitioner

## 2022-03-09 DIAGNOSIS — M1A9XX0 Chronic gout, unspecified, without tophus (tophi): Secondary | ICD-10-CM

## 2022-03-09 NOTE — Telephone Encounter (Signed)
Chart supports Rx Last OV: 09/2021 VV Next OV: 10/2022

## 2022-03-18 DIAGNOSIS — D225 Melanocytic nevi of trunk: Secondary | ICD-10-CM | POA: Diagnosis not present

## 2022-03-18 DIAGNOSIS — Z85828 Personal history of other malignant neoplasm of skin: Secondary | ICD-10-CM | POA: Diagnosis not present

## 2022-03-18 DIAGNOSIS — Z86018 Personal history of other benign neoplasm: Secondary | ICD-10-CM | POA: Diagnosis not present

## 2022-03-18 DIAGNOSIS — Z129 Encounter for screening for malignant neoplasm, site unspecified: Secondary | ICD-10-CM | POA: Diagnosis not present

## 2022-03-18 DIAGNOSIS — L570 Actinic keratosis: Secondary | ICD-10-CM | POA: Diagnosis not present

## 2022-03-30 ENCOUNTER — Telehealth: Payer: Self-pay

## 2022-03-30 NOTE — Patient Outreach (Signed)
  Care Coordination   03/30/2022 Name: Hakop Humbarger MRN: [id number] DOB: [birth year]   Care Coordination Outreach Attempts:  An unsuccessful telephone outreach was attempted today to offer the patient information about available care coordination services as a benefit of their health plan.   Follow Up Plan:  Additional outreach attempts will be made to offer the patient care coordination information and services.   Encounter Outcome:  No Answer  Care Coordination Interventions Activated:  No   Care Coordination Interventions:  No, not indicated    Jone Baseman, RN, MSN [facility] Care Management Care Management Coordinator Direct Line [phone]

## 2022-04-07 ENCOUNTER — Telehealth: Payer: Self-pay

## 2022-04-07 NOTE — Patient Outreach (Signed)
  Care Coordination   04/07/2022 Name: Andres Taylor MRN: [id number] DOB: 18-Jul-1945   Care Coordination Outreach Attempts:  A second unsuccessful outreach was attempted today to offer the patient with information about available care coordination services as a benefit of their health plan.     Follow Up Plan:  Additional outreach attempts will be made to offer the patient care coordination information and services.   Encounter Outcome:  No Answer  Care Coordination Interventions Activated:  No   Care Coordination Interventions:  No, not indicated    Jone Baseman, RN, MSN [facility] Care Management Care Management Coordinator Direct Line [phone]

## 2022-04-15 ENCOUNTER — Telehealth: Payer: Self-pay

## 2022-04-15 NOTE — Patient Instructions (Signed)
Visit Information  Thank you for taking time to visit with me today. Please don't hesitate to contact me if I can be of assistance to you.   Following are the goals we discussed today:   Goals Addressed             This Visit's Progress    COMPLETED: Care Coordination Activities-No follow up required       Care Coordination Interventions: Advised patient to Schedule annual wellness visit and flu vaccine           If you are experiencing a Mental Health or [place] or need someone to talk to, please call the Suicide and Crisis Lifeline: 988   Patient verbalizes understanding of instructions and care plan provided today and agrees to view in [place]. Active MyChart status and patient understanding of how to access instructions and care plan via MyChart confirmed with patient.     No further follow up required: patient declined  Jone Baseman, RN, MSN [place] Management Care Management Coordinator Direct Line [phone]

## 2022-04-15 NOTE — Patient Outreach (Signed)
  Care Coordination   Initial Visit Note   04/15/2022 Name: Andres Taylor MRN: [id number] DOB: 1945/12/24  Andres Taylor is a 76 y.o. year old male who sees Nche, Charlene Brooke, NP for primary care. I spoke with  Andres Taylor by phone today.  What matters to the patients health and wellness today?  None     Goals Addressed             This Visit's Progress    COMPLETED: Care Coordination Activities-No follow up required       Care Coordination Interventions: Advised patient to Schedule annual wellness visit and flu vaccine          SDOH assessments and interventions completed:  Yes     Care Coordination Interventions Activated:  Yes  Care Coordination Interventions:  Yes, provided   Follow up plan: No further intervention required.   Encounter Outcome:  Pt. Visit Completed   Andres Baseman, RN, MSN [facility] Management Care Management Coordinator Direct Line [phone]

## 2022-04-27 ENCOUNTER — Encounter: Payer: Self-pay | Admitting: Nurse Practitioner

## 2022-04-27 ENCOUNTER — Ambulatory Visit (INDEPENDENT_AMBULATORY_CARE_PROVIDER_SITE_OTHER): Payer: Medicare Other | Admitting: Nurse Practitioner

## 2022-04-27 VITALS — BP 137/70 | HR 66 | Temp 97.1°F | Wt 245.8 lb

## 2022-04-27 DIAGNOSIS — Z23 Encounter for immunization: Secondary | ICD-10-CM

## 2022-04-27 DIAGNOSIS — R1013 Epigastric pain: Secondary | ICD-10-CM | POA: Insufficient documentation

## 2022-04-27 HISTORY — DX: Epigastric pain: R10.13

## 2022-04-27 NOTE — Progress Notes (Signed)
Acute Office Visit  Subjective:     Patient ID: Andres Taylor, male    DOB: 1946-01-09, 76 y.o.   MRN: [id number]  Chief Complaint  Patient presents with   Acute Visit    Pt c/o burning sensation in stomach w/ discomfort possibly due to taking aleve on an empty stomach. Pt states symptoms has since subsided.     HPI Patient is in today for burning, abdominal pain intermittently for the last 3 weeks.  He states that he went on a road trip and did not have his methocarbamol so he brought Aleve with him instead.  He states that he took this several times without food.  He has been noticing some burning discomfort in his abdomen that comes and goes.  He denies constipation, diarrhea, blood in his stool.  He took some over-the-counter Prilosec and states that this has helped his symptoms.  Since he got home, he has not taken any more Aleve.  He has not had any pain in the last 3 days.  ROS See pertinent positives and negatives per HPI.     Objective:    BP 137/70 (BP Location: Left Arm, Patient Position: Sitting)   Pulse 66   Temp (!) 97.1 F (36.2 C) (Temporal)   Wt 245 lb 12.8 oz (111.5 kg)   SpO2 97%   BMI 37.37 kg/m    Physical Exam Vitals and nursing note reviewed.  Constitutional:      Appearance: Normal appearance.  HENT:     Head: Normocephalic.  Eyes:     Conjunctiva/sclera: Conjunctivae normal.  Cardiovascular:     Rate and Rhythm: Normal rate and regular rhythm.     Pulses: Normal pulses.     Heart sounds: Normal heart sounds.  Pulmonary:     Effort: Pulmonary effort is normal.     Breath sounds: Normal breath sounds.  Abdominal:     Palpations: Abdomen is soft.     Tenderness: There is no abdominal tenderness.  Musculoskeletal:     Cervical back: Normal range of motion.  Skin:    General: Skin is warm.  Neurological:     General: No focal deficit present.     Mental Status: He is alert and oriented to person, place, and time.  Psychiatric:         Mood and Affect: Mood normal.        Behavior: Behavior normal.        Thought Content: Thought content normal.        Judgment: Judgment normal.       Assessment & Plan:   Problem List Items Addressed This Visit       Other   Epigastric pain - Primary    He has been experiencing intermittent abdominal burning since taking Aleve on an empty stomach.  He has not had any pain in the last 3 days.  No red flags on exam.  Symptoms most likely from the Aleve.  Discussed not taking this on an empty stomach and to continue his Prilosec for 7 more days and he can trial stopping it.  Also discussed eating smaller, more frequent meals and bland foods for the next week.  Encouraged him to reach out if his symptoms return or if he notices any blood in his stool.      Other Visit Diagnoses     Need for influenza vaccination       Flu vaccine given today   Relevant Orders  Flu Vaccine QUAD High Dose(Fluad) (Completed)       No orders of the defined types were placed in this encounter.   Return if symptoms worsen or fail to improve.  Charyl Dancer, NP

## 2022-04-27 NOTE — Patient Instructions (Signed)
It was great to see you!  Keep taking prilosec 1 tablet daily for 7 more days then stop. You can always start this up again as needed. Try to limit aleve, or take with food.   Let's follow-up if your symptoms worsen or don't improve.   Take care,  Vance Peper, NP

## 2022-04-27 NOTE — Assessment & Plan Note (Signed)
He has been experiencing intermittent abdominal burning since taking Aleve on an empty stomach.  He has not had any pain in the last 3 days.  No red flags on exam.  Symptoms most likely from the Aleve.  Discussed not taking this on an empty stomach and to continue his Prilosec for 7 more days and he can trial stopping it.  Also discussed eating smaller, more frequent meals and bland foods for the next week.  Encouraged him to reach out if his symptoms return or if he notices any blood in his stool.

## 2022-05-19 ENCOUNTER — Encounter: Payer: Self-pay | Admitting: Family Medicine

## 2022-05-19 ENCOUNTER — Ambulatory Visit (INDEPENDENT_AMBULATORY_CARE_PROVIDER_SITE_OTHER): Payer: Medicare Other | Admitting: Family Medicine

## 2022-05-19 VITALS — BP 128/86 | HR 74 | Temp 97.4°F | Wt 241.0 lb

## 2022-05-19 DIAGNOSIS — R0981 Nasal congestion: Secondary | ICD-10-CM | POA: Diagnosis not present

## 2022-05-19 DIAGNOSIS — R5383 Other fatigue: Secondary | ICD-10-CM

## 2022-05-19 LAB — POC COVID19 BINAXNOW: SARS Coronavirus 2 Ag: NEGATIVE

## 2022-05-19 MED ORDER — PREDNISONE 10 MG (21) PO TBPK: ORAL_TABLET | ORAL | 0 refills | Status: DC

## 2022-05-19 NOTE — Patient Instructions (Signed)
Nasal congestoin, is likely from a viral infection.   Try prednisone and nasal lavage.   Please be sure to drink plenty of fluids.   You may take the following OTC medications to help with symptoms:  For cough, use  cough syrups or other cough suppressants.  For headache, sore throat, fevers, muscle aches, chills, other pain, take ibuprofen or tylenol  For congestion, use nasal sprays, decongestants, or antihistamines  Please follow up if no improvement.   Go to ED if you have severe chest pain, fevers, shortness of breath or other worrisome symptoms.

## 2022-05-19 NOTE — Progress Notes (Signed)
Assessment/Plan:   Problem List Items Addressed This Visit   None Visit Diagnoses     Tired    -  Primary   Nose congestion       Relevant Medications   predniSONE (STERAPRED UNI-PAK 21 TAB) 10 MG (21) TBPK tablet   Other Relevant Orders   POC COVID-19 (Completed)      Patient consistent with likely viral URI, patient has history of complex sinus allergies and respiratory lung disease We will treat symptomatically with prednisone course Return precautions discussed    Subjective:  HPI:  Andres Taylor is a 76 y.o. male who has Dyspnea on exertion; Leg edema; Hyperlipidemia associated with type 2 diabetes mellitus ([place]); HTN (hypertension), benign; Class 2 severe obesity due to excess calories with serious comorbidity and body mass index (BMI) of 38.0 to 38.9 in adult [facility]); Microvascular angina; Pre-operative cardiovascular examination; Other and unspecified coagulation defects; Scarring of lung; CKD (chronic kidney disease) stage 2, GFR 60-89 ml/min; OSA on CPAP; Restrictive lung disease; Cervical radiculopathy; Diabetic neuropathy associated with type 2 diabetes mellitus ([place]); DM (diabetes mellitus), type 2 ([place]); Hyperuricemia; Mixed conductive and sensorineural hearing loss of both ears; Acute angle-closure glaucoma of both eyes; Dermatochalasis of both upper eyelids; Hyperopia with astigmatism and presbyopia, bilateral; Nuclear sclerotic cataract of left eye; Posterior subcapsular age-related cataract of both eyes; Vitreous floater, bilateral; Lumbar radiculopathy, right; Chronic cough; Generalized abdominal pain; Asthma; Difficult airway for intubation; Bilateral inguinal hernia (BIH); Allergic rhinitis; Aortic atherosclerosis ([place]); and Epigastric pain on their problem list..   He  has a past medical history of Abdominal bloating (03/13/2019), Allergic rhinitis, Allergy, Arthritis, Asthma, Blood in urine, Cataract, Chronic cough, CKD (chronic kidney disease), stage II, DDD  (degenerative disc disease), Diabetes mellitus ([place]), DOE (dyspnea on exertion), Eustachian tube dysfunction, Fatty liver, GERD (gastroesophageal reflux disease), Glaucoma, Gout, Hard of hearing, Heme positive stool (03/13/2019), History of cardiomegaly (05/16/2019), History of kidney stones, Hyperlipidemia, Hypertension, Inguinal hernia, Joint pain, knee, Leg edema, Lung tumor (benign), right, Obesity, OSA on CPAP, Pneumonitis, Pulmonary HTN ([place]) (04/18/2013), Restrictive lung disease, Sciatica, Spinal stenosis, Wears contact lenses, Wears glasses, and Wears hearing aid in both ears.[name]   He presents with chief complaint of Fatigue (Congestion , fatigue x 4 days. ) .   Upper respiratory symptoms He complains of congestion and fatigue .with no fever, chills, night sweats or weight loss. Onset of symptoms was  4 days ago and staying constant.He is drinking plenty of fluids.  Past history is significant for asthma and restrictive lung disease .  Patient uses his Breo inhaler daily.  Has not had to use albuterol inhaler.  He has been using Coricidin and Mucinex for symptoms with minimal improvement.  Patient also uses ipratropium daily for rhinitis.  He has used nasal lavage in the past but is not currently using.  Patient has tried Flonase in the past without improvement in symptoms.  The patient denies cough, chest pain, dyspnea, palpitations, wheezing or hemoptysis.  Wife has recent respiratory illness.  She went to the emergency department and was treated with Z-Pak.  She tested negative for COVID.  ---------------------------------------------------------------------------------------------------   Past Surgical History:  Procedure Laterality Date   APPENDECTOMY  1976   COLONOSCOPY  08/2010   COLONOSCOPY  08/07/2019   HEMORRHOID SURGERY  2001   INGUINAL HERNIA REPAIR Left 1949   as a baby 55 months old   INGUINAL HERNIA REPAIR Bilateral 09/21/2019   Procedure: LAPAROSCOPIC BILATERAL INGUINAL  HERNIA REPAIR, LYSIS  OF ADHESIONS;  Surgeon: Michael Boston, MD;  Location: [facility];  Service: General;  Laterality: Bilateral;   LEFT AND RIGHT HEART CATHETERIZATION WITH CORONARY ANGIOGRAM N/A 04/20/2013   Procedure: LEFT AND RIGHT HEART CATHETERIZATION WITH CORONARY ANGIOGRAM;  Surgeon: Leonie Man, MD;  Location: [facility] CATH LAB;  Angiographically normal Coronary Arteries - L Dom Cx (OM1, small LPL1 then LPL2 & PDA) & wraparound LAD (D1-3 small).  Borderline CO/CI - 4.94/2.16 by both Fick & TD.  RAP ~14 mmHg, RVEDP 15 mmHg.  LVEDP 19 mmHg (->diastolic dysfunction) PCWP 16 mmHg, but PA Mean 25 mmHg   LITHOTRIPSY     x 2   LOBECTOMY Right    lumbar disectomy L4-5  1980   NM MYOVIEW LTD  06/15/2019    EF 70%.  No ischemia or infarction.  LOW RISK    TRANSTHORACIC ECHOCARDIOGRAM  04/21/2019   EF 60 to 65%.  Mild LVH.  Normal RV.  Normal atrial size.  Mild aortic sclerosis.  No stenosis. Lipomatous intra-atrial septum   UPPER GI ENDOSCOPY  08/07/2019   VIDEO ASSISTED THORACOSCOPY (VATS)/WEDGE RESECTION Right 08/30/2013   Procedure: VIDEO ASSISTED THORACOSCOPY (VATS)/WEDGE RESECTION;  Surgeon: Melrose Nakayama, MD;  Location: [facility];  Service: Thoracic;  Laterality: Right;    Outpatient Medications Prior to Visit  Medication Sig Dispense Refill   allopurinol (ZYLOPRIM) 100 MG tablet TAKE 1 TABLET(100 MG) BY MOUTH DAILY 90 tablet 3   aspirin EC 81 MG tablet Take 81 mg by mouth daily.     atorvastatin (LIPITOR) 20 MG tablet Take 1 tablet (20 mg total) by mouth daily. 90 tablet 3   BREO ELLIPTA 200-25 MCG/ACT AEPB Inhale 1 puff into the lungs daily. 60 each 4   cetirizine (ZYRTEC) 10 MG chewable tablet Chew 10 mg by mouth daily.     Coenzyme Q-10 100 MG capsule Take 100 mg by mouth 2 (two) times daily.     colchicine 0.6 MG tablet Take 1 tablet (0.6 mg total) by mouth 2 (two) times daily as needed (for gout). PRN for Gout 6 tablet 1   diltiazem (CARDIZEM CD) 180 MG 24 hr  capsule TAKE 1 CAPSULE(180 MG) BY MOUTH DAILY 90 capsule 2   furosemide (LASIX) 20 MG tablet TAKE 1 TABLET BY MOUTH AS NEEDED, DO NOT TAKE HCTZ THE DAY YOU USE MEDICATION AS NEEDED FOR SWELLING OR SHORTNESS OF BREATH 30 tablet 6   guaiFENesin (MUCINEX) 600 MG 12 hr tablet Take 600 mg by mouth daily.     hydrochlorothiazide (MICROZIDE) 12.5 MG capsule TAKE 1 CAPSULE BY MOUTH DAILY 90 capsule 3   ipratropium (ATROVENT) 0.06 % nasal spray USE 2 SPRAYS IN BOTH  NOSTRILS IN THE MORNING AND AT BEDTIME 60 mL 3   metFORMIN (GLUCOPHAGE) 850 MG tablet Take 1bid 180 tablet 3   methocarbamol (ROBAXIN) 500 MG tablet TAKE 1 TABLET(500 MG) BY MOUTH EVERY 12 HOURS 180 tablet 1   montelukast (SINGULAIR) 10 MG tablet TAKE 1 TABLET(10 MG) BY MOUTH AT BEDTIME 90 tablet 1   Multiple Vitamin (MULTIVITAMIN) tablet Take 1 tablet by mouth daily.     olmesartan (BENICAR) 20 MG tablet TAKE 1 TABLET BY MOUTH  DAILY 90 tablet 3   omega-3 acid ethyl esters (LOVAZA) 1 g capsule TAKE 1 CAPSULE( 1 GRAM) BY MOUTH DAILY 90 capsule 3   PROAIR RESPICLICK 536 (90 Base) MCG/ACT AEPB INHALE 2 PUFFS EVERY 4-6 HOURS AS NEEDED FOR COUGH/ WHEEZE 1 each 0  No facility-administered medications prior to visit.    Family History  Problem Relation Age of Onset   Leukemia Father    Breast cancer Maternal Grandmother    Cancer Maternal Grandmother        breast cancer   Stroke Maternal Grandmother    Hypertension Maternal Grandfather    Stroke Maternal Grandfather    Stroke Mother    Cancer Son        Ewing's sarcoma   Heart failure Son        Related to Adriamycin toxicity.  Had 2 consecutive heart transplants   AAA (abdominal aortic aneurysm) Son    Stroke Paternal Grandmother    Colon cancer Neg Hx    Esophageal cancer Neg Hx    Colon polyps Neg Hx    Rectal cancer Neg Hx    Stomach cancer Neg Hx     Social History   Socioeconomic History   Marital status: Married    Spouse name: Not on file   Number of children: 3    Years of education: Not on file   Highest education level: High school graduate  Occupational History   Occupation: Real Air traffic controller: [name]  Tobacco Use   Smoking status: Former    Packs/day: 1.50    Years: 42.00    Total pack years: 63.00    Types: Cigarettes    Quit date: 07/13/2002    Years since quitting: 19.8   Smokeless tobacco: Former  Scientific laboratory technician Use: Never used  Substance and Sexual Activity   Alcohol use: Yes    Alcohol/week: 3.0 standard drinks of alcohol    Types: 3 Cans of beer per week    Comment: 3 tmes weekly   Drug use: No   Sexual activity: Not on file  Other Topics Concern   Not on file  Social History Narrative   He and his first wife divorced in 20 14/2015.  He is now remarried and lives with his new wife.   He has 2 adult children-aged 59 and 6 (he had 3 children, 1 of his sons--actually the twin brother of 16 of the existing sons, died after complications of redo heart transplant following Adriamycin toxicity from leukemia as a child, he died at age 52.     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   He quit smoking in 2004 after 50+ pack year smoking history.  He occasionally drinks alcohol.   Social Determinants of Health   Financial Resource Strain: Low Risk  (10/14/2021)   Overall Financial Resource Strain (CARDIA)    Difficulty of Paying Living Expenses: Not hard at all  Food Insecurity: No Food Insecurity (10/14/2021)   Hunger Vital Sign    Worried About Running Out of Food in the Last Year: Never true    Ran Out of Food in the Last Year: Never true  Transportation Needs: No Transportation Needs (10/14/2021)   PRAPARE - Hydrologist (Medical): No    Lack of Transportation (Non-Medical): No  Physical Activity: Insufficiently Active (10/14/2021)   Exercise Vital Sign    Days of Exercise per Week: 3 days    Minutes of Exercise per Session: 30 min  Stress: No Stress  Concern Present (10/14/2021)   [place]    Feeling of Stress : Not at all  Social Connections: [place] (10/14/2021)  Social Licensed conveyancer [NHANES]    Frequency of Communication with Friends and Family: Twice a week    Frequency of Social Gatherings with Friends and Family: Twice a week    Attends Religious Services: More than 4 times per year    Active Member of [employer] or Organizations: Yes    Attends Archivist Meetings: 1 to 4 times per year    Marital Status: Married  Human resources officer Violence: Not At Risk (10/14/2021)   Humiliation, Afraid, Rape, and Kick questionnaire    Fear of Current or Ex-Partner: No    Emotionally Abused: No    Physically Abused: No    Sexually Abused: No                                                                                                 Objective:  Physical Exam: BP 128/86 (BP Location: Left Arm, Patient Position: Sitting, Cuff Size: Large)   Pulse 74   Temp (!) 97.4 F (36.3 C) (Temporal)   Wt 241 lb (109.3 kg)   SpO2 93%   BMI 36.64 kg/m    General: No acute distress. Awake and conversant.  Eyes: Normal conjunctiva, anicteric. Round symmetric pupils.  ENT: Hearing grossly intact. No nasal discharge.  Bilateral tympanic membranes normal, no oropharyngeal lesions Neck: Neck is supple. No masses or thyromegaly.  Respiratory: Respirations are non-labored. No auditory wheezing.  CTA B Skin: Warm. No rashes or ulcers.  Psych: Alert and oriented. Cooperative, Appropriate mood and affect, Normal judgment.  CV: No cyanosis or JVD, RRR, no MRG MSK: Normal ambulation. No clubbing  Neuro: Sensation and CN II-XII grossly normal.   Results for orders placed or performed in visit on 05/19/22  POC COVID-19  Result Value Ref Range   SARS Coronavirus 2 Ag Negative Negative        Alesia Banda, MD, MS

## 2022-06-09 ENCOUNTER — Other Ambulatory Visit: Payer: Self-pay | Admitting: Nurse Practitioner

## 2022-06-09 DIAGNOSIS — M5416 Radiculopathy, lumbar region: Secondary | ICD-10-CM

## 2022-06-09 NOTE — Telephone Encounter (Signed)
Caller Name: pt Call back phone #: [phone]    MEDICATION(S):  methocarbamol (ROBAXIN) 500 MG tablet [[id number]]   Days of Med Remaining:   Has the patient contacted their pharmacy (YES/NO)? yes What did pharmacy advise? Has to be put in  Preferred Pharmacy:  [facility] ([place], [place] Phone: [phone]  Fax: [phone]      ~~~Please advise patient/caregiver to allow 2-3 business days to process RX refills.

## 2022-06-30 ENCOUNTER — Telehealth: Payer: Self-pay | Admitting: Nurse Practitioner

## 2022-06-30 NOTE — Telephone Encounter (Signed)
Caller Name: Medical Supply Call back phone #: [phone] (Fax)  Reason for Call: For diabetic supply, requested most recent OV notes. Approval was already sent

## 2022-07-01 NOTE — Telephone Encounter (Signed)
Form received, will fax back to med supply company today

## 2022-07-04 DIAGNOSIS — J45991 Cough variant asthma: Secondary | ICD-10-CM | POA: Diagnosis not present

## 2022-07-06 ENCOUNTER — Other Ambulatory Visit: Payer: Self-pay | Admitting: Adult Health

## 2022-07-16 ENCOUNTER — Other Ambulatory Visit: Payer: Self-pay

## 2022-07-16 ENCOUNTER — Encounter: Payer: Self-pay | Admitting: Nurse Practitioner

## 2022-07-16 DIAGNOSIS — M1A9XX0 Chronic gout, unspecified, without tophus (tophi): Secondary | ICD-10-CM

## 2022-07-16 MED ORDER — ALLOPURINOL 100 MG PO TABS: ORAL_TABLET | ORAL | 0 refills | Status: DC

## 2022-07-16 MED ORDER — COLCHICINE 0.6 MG PO TABS: 0.6000 mg | ORAL_TABLET | Freq: Two times a day (BID) | ORAL | 1 refills | Status: DC | PRN

## 2022-07-23 ENCOUNTER — Other Ambulatory Visit: Payer: Self-pay | Admitting: Nurse Practitioner

## 2022-07-23 DIAGNOSIS — E782 Mixed hyperlipidemia: Secondary | ICD-10-CM

## 2022-07-24 NOTE — Telephone Encounter (Signed)
Chart supports Rx Last OV: 06/2022 Next OV: 10/2022

## 2022-08-27 ENCOUNTER — Other Ambulatory Visit: Payer: Self-pay | Admitting: Nurse Practitioner

## 2022-08-27 DIAGNOSIS — E1142 Type 2 diabetes mellitus with diabetic polyneuropathy: Secondary | ICD-10-CM

## 2022-09-02 ENCOUNTER — Encounter: Payer: Self-pay | Admitting: Gastroenterology

## 2022-09-05 ENCOUNTER — Other Ambulatory Visit: Payer: Self-pay | Admitting: Nurse Practitioner

## 2022-09-05 DIAGNOSIS — R053 Chronic cough: Secondary | ICD-10-CM

## 2022-09-17 ENCOUNTER — Telehealth: Payer: Self-pay

## 2022-09-17 DIAGNOSIS — R053 Chronic cough: Secondary | ICD-10-CM

## 2022-09-17 DIAGNOSIS — I10 Essential (primary) hypertension: Secondary | ICD-10-CM

## 2022-09-17 DIAGNOSIS — M5416 Radiculopathy, lumbar region: Secondary | ICD-10-CM

## 2022-09-17 MED ORDER — METHOCARBAMOL 500 MG PO TABS: ORAL_TABLET | ORAL | 0 refills | Status: DC

## 2022-09-17 MED ORDER — MONTELUKAST SODIUM 10 MG PO TABS: 10.0000 mg | ORAL_TABLET | Freq: Every day | ORAL | 0 refills | Status: DC

## 2022-09-17 MED ORDER — OLMESARTAN MEDOXOMIL 20 MG PO TABS: ORAL_TABLET | ORAL | 0 refills | Status: DC

## 2022-09-17 NOTE — Telephone Encounter (Signed)
Pt has med refill request. Per Baldo Ash we will refill but he will need to schedule a follow-up visit.

## 2022-10-10 ENCOUNTER — Other Ambulatory Visit: Payer: Self-pay | Admitting: Nurse Practitioner

## 2022-10-10 DIAGNOSIS — M5416 Radiculopathy, lumbar region: Secondary | ICD-10-CM

## 2022-10-15 ENCOUNTER — Other Ambulatory Visit: Payer: Self-pay | Admitting: Nurse Practitioner

## 2022-10-15 DIAGNOSIS — M4726 Other spondylosis with radiculopathy, lumbar region: Secondary | ICD-10-CM | POA: Diagnosis not present

## 2022-10-15 DIAGNOSIS — M1A9XX0 Chronic gout, unspecified, without tophus (tophi): Secondary | ICD-10-CM

## 2022-10-15 DIAGNOSIS — M9903 Segmental and somatic dysfunction of lumbar region: Secondary | ICD-10-CM | POA: Diagnosis not present

## 2022-10-15 DIAGNOSIS — M9904 Segmental and somatic dysfunction of sacral region: Secondary | ICD-10-CM | POA: Diagnosis not present

## 2022-10-19 ENCOUNTER — Ambulatory Visit (INDEPENDENT_AMBULATORY_CARE_PROVIDER_SITE_OTHER): Payer: Medicare Other

## 2022-10-19 VITALS — Ht 67.5 in | Wt 240.0 lb

## 2022-10-19 DIAGNOSIS — M4726 Other spondylosis with radiculopathy, lumbar region: Secondary | ICD-10-CM | POA: Diagnosis not present

## 2022-10-19 DIAGNOSIS — M9903 Segmental and somatic dysfunction of lumbar region: Secondary | ICD-10-CM | POA: Diagnosis not present

## 2022-10-19 DIAGNOSIS — Z0000 Encounter for general adult medical examination without abnormal findings: Secondary | ICD-10-CM | POA: Diagnosis not present

## 2022-10-19 DIAGNOSIS — M9904 Segmental and somatic dysfunction of sacral region: Secondary | ICD-10-CM | POA: Diagnosis not present

## 2022-10-19 NOTE — Patient Instructions (Signed)
[name] , Thank you for taking time to come for your Medicare Wellness Visit. I appreciate your ongoing commitment to your health goals. Please review the following plan we discussed and let me know if I can assist you in the future.   These are the goals we discussed:  Goals      Chronic Care Management     CARE PLAN ENTRY  Current Barriers:  Chronic Disease Management support, education, and care coordination needs related to hypertension, asthma, type 2 diabetes, hyperlipidemia, gout  Clinical Goal(s): Over the next 180 days, patient will:  Work with the care management team to address educational, disease management, and care coordination needs  Begin or continue self health monitoring activities as directed today  Call provider office for new or worsened signs and symptoms  Call care management team with questions or concerns Maintain blood pressure less than 140/80 Maintain A1c less than 7%  Maintain LDL (Bad cholesterol) less than 70    Interventions: Evaluation of current treatment plans and patient's adherence to plan as established by provider Assessed patient understanding of disease states Assessed patient's education and care coordination needs Provided disease specific education to patient   Telephone follow up appointment with pharmacy team member scheduled for: 04/15/20 at 2:00 PM      Patient Stated     Increase activity as tolerated     Patient Stated     10/19/2022, not to get any worse        This is a list of the screening recommended for you and due dates:  Health Maintenance  Topic Date Due   Zoster (Shingles) Vaccine (1 of 2) Never done   Eye exam for diabetics  06/22/2020   Complete foot exam   10/07/2021   COVID-19 Vaccine (4 - 2023-24 season) 03/13/2022   Yearly kidney health urinalysis for diabetes  04/09/2022   Hemoglobin A1C  04/09/2022   Colon Cancer Screening  08/06/2022   Yearly kidney function blood test for diabetes  10/08/2022    Flu Shot  02/11/2023   DTaP/Tdap/Td vaccine (4 - Td or Tdap) 07/14/2023   Medicare Annual Wellness Visit  10/19/2023   Pneumonia Vaccine  Completed   Hepatitis C Screening: USPSTF Recommendation to screen - Ages 35-79 yo.  Completed   HPV Vaccine  Aged Out    Advanced directives: Please bring a copy of your POA (Power of [place]) and/or Living Will to your next appointment.   Conditions/risks identified: none  Next appointment: Follow up in one year for your annual wellness visit.   Preventive Care 81 Years and Older, Male  Preventive care refers to lifestyle choices and visits with your health care provider that can promote health and wellness. What does preventive care include? A yearly physical exam. This is also called an annual well check. Dental exams once or twice a year. Routine eye exams. Ask your health care provider how often you should have your eyes checked. Personal lifestyle choices, including: Daily care of your teeth and gums. Regular physical activity. Eating a healthy diet. Avoiding tobacco and drug use. Limiting alcohol use. Practicing safe sex. Taking low doses of aspirin every day. Taking vitamin and mineral supplements as recommended by your health care provider. What happens during an annual well check? The services and screenings done by your health care provider during your annual well check will depend on your age, overall health, lifestyle risk factors, and family history of disease. Counseling  Your health care provider may  ask you questions about your: Alcohol use. Tobacco use. Drug use. Emotional well-being. Home and relationship well-being. Sexual activity. Eating habits. History of falls. Memory and ability to understand (cognition). Work and work Astronomer. Screening  You may have the following tests or measurements: Height, weight, and BMI. Blood pressure. Lipid and cholesterol levels. These may be checked every 5 years, or more  frequently if you are over 50 years old. Skin check. Lung cancer screening. You may have this screening every year starting at age 66 if you have a 30-pack-year history of smoking and currently smoke or have quit within the past 15 years. Fecal occult blood test (FOBT) of the stool. You may have this test every year starting at age 70. Flexible sigmoidoscopy or colonoscopy. You may have a sigmoidoscopy every 5 years or a colonoscopy every 10 years starting at age 76. Prostate cancer screening. Recommendations will vary depending on your family history and other risks. Hepatitis C blood test. Hepatitis B blood test. Sexually transmitted disease (STD) testing. Diabetes screening. This is done by checking your blood sugar (glucose) after you have not eaten for a while (fasting). You may have this done every 1-3 years. Abdominal aortic aneurysm (AAA) screening. You may need this if you are a current or former smoker. Osteoporosis. You may be screened starting at age 11 if you are at high risk. Talk with your health care provider about your test results, treatment options, and if necessary, the need for more tests. Vaccines  Your health care provider may recommend certain vaccines, such as: Influenza vaccine. This is recommended every year. Tetanus, diphtheria, and acellular pertussis (Tdap, Td) vaccine. You may need a Td booster every 10 years. Zoster vaccine. You may need this after age 76. Pneumococcal 13-valent conjugate (PCV13) vaccine. One dose is recommended after age 49. Pneumococcal polysaccharide (PPSV23) vaccine. One dose is recommended after age 36. Talk to your health care provider about which screenings and vaccines you need and how often you need them. This information is not intended to replace advice given to you by your health care provider. Make sure you discuss any questions you have with your health care provider. Document Released: 07/26/2015 Document Revised: 03/18/2016  Document Reviewed: 04/30/2015 Elsevier Interactive Patient Education  2017 [employer].  Fall Prevention in the Home Falls can cause injuries. They can happen to people of all ages. There are many things you can do to make your home safe and to help prevent falls. What can I do on the outside of my home? Regularly fix the edges of walkways and driveways and fix any cracks. Remove anything that might make you trip as you walk through a door, such as a raised step or threshold. Trim any bushes or trees on the path to your home. Use bright outdoor lighting. Clear any walking paths of anything that might make someone trip, such as rocks or tools. Regularly check to see if handrails are loose or broken. Make sure that both sides of any steps have handrails. Any raised decks and porches should have guardrails on the edges. Have any leaves, snow, or ice cleared regularly. Use sand or salt on walking paths during winter. Clean up any spills in your garage right away. This includes oil or grease spills. What can I do in the bathroom? Use night lights. Install grab bars by the toilet and in the tub and shower. Do not use towel bars as grab bars. Use non-skid mats or decals in the tub or shower.  If you need to sit down in the shower, use a plastic, non-slip stool. Keep the floor dry. Clean up any water that spills on the floor as soon as it happens. Remove soap buildup in the tub or shower regularly. Attach bath mats securely with double-sided non-slip rug tape. Do not have throw rugs and other things on the floor that can make you trip. What can I do in the bedroom? Use night lights. Make sure that you have a light by your bed that is easy to reach. Do not use any sheets or blankets that are too big for your bed. They should not hang down onto the floor. Have a firm chair that has side arms. You can use this for support while you get dressed. Do not have throw rugs and other things on the floor  that can make you trip. What can I do in the kitchen? Clean up any spills right away. Avoid walking on wet floors. Keep items that you use a lot in easy-to-reach places. If you need to reach something above you, use a strong step stool that has a grab bar. Keep electrical cords out of the way. Do not use floor polish or wax that makes floors slippery. If you must use wax, use non-skid floor wax. Do not have throw rugs and other things on the floor that can make you trip. What can I do with my stairs? Do not leave any items on the stairs. Make sure that there are handrails on both sides of the stairs and use them. Fix handrails that are broken or loose. Make sure that handrails are as long as the stairways. Check any carpeting to make sure that it is firmly attached to the stairs. Fix any carpet that is loose or worn. Avoid having throw rugs at the top or bottom of the stairs. If you do have throw rugs, attach them to the floor with carpet tape. Make sure that you have a light switch at the top of the stairs and the bottom of the stairs. If you do not have them, ask someone to add them for you. What else can I do to help prevent falls? Wear shoes that: Do not have high heels. Have rubber bottoms. Are comfortable and fit you well. Are closed at the toe. Do not wear sandals. If you use a stepladder: Make sure that it is fully opened. Do not climb a closed stepladder. Make sure that both sides of the stepladder are locked into place. Ask someone to hold it for you, if possible. Clearly mark and make sure that you can see: Any grab bars or handrails. First and last steps. Where the edge of each step is. Use tools that help you move around (mobility aids) if they are needed. These include: Canes. Walkers. Scooters. Crutches. Turn on the lights when you go into a dark area. Replace any light bulbs as soon as they burn out. Set up your furniture so you have a clear path. Avoid moving your  furniture around. If any of your floors are uneven, fix them. If there are any pets around you, be aware of where they are. Review your medicines with your doctor. Some medicines can make you feel dizzy. This can increase your chance of falling. Ask your doctor what other things that you can do to help prevent falls. This information is not intended to replace advice given to you by your health care provider. Make sure you discuss any questions you have  with your health care provider. Document Released: 04/25/2009 Document Revised: 12/05/2015 Document Reviewed: 08/03/2014 Elsevier Interactive Patient Education  2017 [employer].

## 2022-10-19 NOTE — Progress Notes (Signed)
I connected with  Andres Taylor on 10/19/22 by a audio enabled telemedicine application and verified that I am speaking with the correct person using two identifiers.  Patient Location: Home  Provider Location: Office/Clinic  I discussed the limitations of evaluation and management by telemedicine. The patient expressed understanding and agreed to proceed.  Subjective:   Andres Taylor is a 77 y.o. male who presents for Medicare Annual/Subsequent preventive examination.  Patient Medicare AWV questionnaire was completed by the patient on 10/14/2022; I have confirmed that all information answered by patient is correct and no changes since this date.     Review of Systems     Cardiac Risk Factors include: advanced age (>14men, >96 women);diabetes mellitus;dyslipidemia;hypertension;male gender;obesity (BMI >30kg/m2)     Objective:    Today's Vitals   10/19/22 0841  Weight: 240 lb (108.9 kg)  Height: 5' 7.5" (1.715 m)   Body mass index is 37.03 kg/m.     10/19/2022    8:48 AM 10/14/2021    9:54 AM 01/14/2021   11:05 AM 10/08/2020    9:50 AM 08/07/2019   10:00 AM 05/16/2019    4:23 PM 08/30/2013   12:21 PM  Advanced Directives  Does Patient Have a Medical Advance Directive? Yes Yes Yes Yes Yes No Patient has advance directive, copy not in chart  Type of Advance Directive Healthcare Power of [place];Living will Healthcare Power of [place];Living will Healthcare Power of [place];Living will Healthcare Power of [place];Living will Healthcare Power of [place];Living will  Living will;Healthcare Power of Attorney  Does patient want to make changes to medical advance directive?     No - Patient declined  No change requested  Copy of Healthcare Power of Attorney in Chart? No - copy requested No - copy requested  No - copy requested No - copy requested  Copy requested from family  Pre-existing out of facility DNR order (yellow form or pink MOST form)       No    Current  Medications (verified) Outpatient Encounter Medications as of 10/19/2022  Medication Sig   allopurinol (ZYLOPRIM) 100 MG tablet TAKE 1 TABLET BY MOUTH DAILY   aspirin EC 81 MG tablet Take 81 mg by mouth daily.   atorvastatin (LIPITOR) 20 MG tablet TAKE 1 TABLET BY MOUTH DAILY   BREO ELLIPTA 200-25 MCG/ACT AEPB Inhale 1 puff into the lungs daily.   cetirizine (ZYRTEC) 10 MG chewable tablet Chew 10 mg by mouth daily.   Coenzyme Q-10 100 MG capsule Take 100 mg by mouth 2 (two) times daily.   diltiazem (CARDIZEM CD) 180 MG 24 hr capsule TAKE 1 CAPSULE(180 MG) BY MOUTH DAILY   furosemide (LASIX) 20 MG tablet TAKE 1 TABLET BY MOUTH AS NEEDED, DO NOT TAKE HCTZ THE DAY YOU USE MEDICATION AS NEEDED FOR SWELLING OR SHORTNESS OF BREATH   guaiFENesin (MUCINEX) 600 MG 12 hr tablet Take 600 mg by mouth daily.   hydrochlorothiazide (MICROZIDE) 12.5 MG capsule TAKE 1 CAPSULE BY MOUTH DAILY   ipratropium (ATROVENT) 0.06 % nasal spray USE 2 SPRAYS IN BOTH NOSTRILS IN THE MORNING AND AT BEDTIME   metFORMIN (GLUCOPHAGE) 850 MG tablet Take 1bid   methocarbamol (ROBAXIN) 500 MG tablet TAKE 1 TABLET BY MOUTH EVERY 12  HOURS   montelukast (SINGULAIR) 10 MG tablet Take 1 tablet (10 mg total) by mouth at bedtime.   Multiple Vitamin (MULTIVITAMIN) tablet Take 1 tablet by mouth daily.   olmesartan (BENICAR) 20 MG tablet TAKE 1 TABLET BY MOUTH  DAILY   omega-3 acid ethyl esters (LOVAZA) 1 g capsule TAKE 1 CAPSULE( 1 GRAM) BY MOUTH DAILY   PROAIR RESPICLICK 108 (90 Base) MCG/ACT AEPB INHALE 2 PUFFS EVERY 4-6 HOURS AS NEEDED FOR COUGH/ WHEEZE   colchicine 0.6 MG tablet Take 1 tablet (0.6 mg total) by mouth 2 (two) times daily as needed (for gout). PRN for Gout (Patient not taking: Reported on 10/19/2022)   predniSONE (STERAPRED UNI-PAK 21 TAB) 10 MG (21) TBPK tablet Take as directed (Patient not taking: Reported on 10/19/2022)   No facility-administered encounter medications on file as of 10/19/2022.    Allergies  (verified) Patient has no known allergies.   History: Past Medical History:  Diagnosis Date   Abdominal bloating 03/13/2019   Allergic rhinitis    Allergy    Arthritis    Asthma    Blood in urine    Cataract    forming    Chronic cough    CKD (chronic kidney disease), stage II    DDD (degenerative disc disease)    Diabetes mellitus    DOE (dyspnea on exertion)    Eustachian tube dysfunction    Fatty liver    GERD (gastroesophageal reflux disease)    Glaucoma    Gout    Hard of hearing    bilateral    Heme positive stool 03/13/2019   History of cardiomegaly 05/16/2019   Noted on CXR   History of kidney stones    Hyperlipidemia    Hypertension    Inguinal hernia    Bilateral   Joint pain, knee    Leg edema    Lung tumor (benign), right    Right upper lobe lung mass   Obesity    OSA on CPAP    Pneumonitis    right lung   Pulmonary HTN 04/18/2013   Restrictive lung disease    Sciatica    Spinal stenosis    Wears contact lenses    Wears glasses    Wears hearing aid in both ears    Past Surgical History:  Procedure Laterality Date   APPENDECTOMY  1976   cataract surgery Bilateral    march and april 2023   COLONOSCOPY  08/2010   COLONOSCOPY  08/07/2019   HEMORRHOID SURGERY  2001   INGUINAL HERNIA REPAIR Left 1948   as a baby 2920 months old   INGUINAL HERNIA REPAIR Bilateral 09/21/2019   Procedure: LAPAROSCOPIC BILATERAL INGUINAL HERNIA REPAIR, LYSIS OF ADHESIONS;  Surgeon: Karie SodaGross, Steven, MD;  Location: [facility];  Service: General;  Laterality: Bilateral;   LEFT AND RIGHT HEART CATHETERIZATION WITH CORONARY ANGIOGRAM N/A 04/20/2013   Procedure: LEFT AND RIGHT HEART CATHETERIZATION WITH CORONARY ANGIOGRAM;  Surgeon: Marykay Lexavid W Harding, MD;  [facility];  Angiographically normal Coronary Arteries - L Dom Cx (OM1, small LPL1 then LPL2 & PDA) & wraparound LAD (D1-3 small).  Borderline CO/CI - 4.94/2.16 by both Fick & TD.  RAP ~14 mmHg, RVEDP 15  mmHg.  LVEDP 19 mmHg (->diastolic dysfunction) PCWP 16 mmHg, but PA Mean 25 mmHg   LITHOTRIPSY     x 2   LOBECTOMY Right    lumbar disectomy L4-5  1980   NM MYOVIEW LTD  06/15/2019    EF 70%.  No ischemia or infarction.  LOW RISK    TRANSTHORACIC ECHOCARDIOGRAM  04/21/2019   EF 60 to 65%.  Mild LVH.  Normal RV.  Normal atrial size.  Mild aortic sclerosis.  No stenosis.  Lipomatous intra-atrial septum   UPPER GI ENDOSCOPY  08/07/2019   VIDEO ASSISTED THORACOSCOPY (VATS)/WEDGE RESECTION Right 08/30/2013   Procedure: VIDEO ASSISTED THORACOSCOPY (VATS)/WEDGE RESECTION;  Surgeon: Loreli Slot, MD;  Location: [facility] OR;  Service: Thoracic;  Laterality: Right;   Family History  Problem Relation Age of Onset   Leukemia Father    Breast cancer Maternal Grandmother    Cancer Maternal Grandmother        breast cancer   Stroke Maternal Grandmother    Hypertension Maternal Grandfather    Stroke Maternal Grandfather    Stroke Mother    Cancer Son        Ewing's sarcoma   Heart failure Son        Related to Adriamycin toxicity.  Had 2 consecutive heart transplants   AAA (abdominal aortic aneurysm) Son    Stroke Paternal Grandmother    Colon cancer Neg Hx    Esophageal cancer Neg Hx    Colon polyps Neg Hx    Rectal cancer Neg Hx    Stomach cancer Neg Hx    Social History   Socioeconomic History   Marital status: Married    Spouse name: Not on file   Number of children: 3   Years of education: Not on file   Highest education level: High school graduate  Occupational History   Occupation: Real Environmental health practitioner: [employer]  Tobacco Use   Smoking status: Former    Packs/day: 1.50    Years: 42.00    Additional pack years: 0.00    Total pack years: 63.00    Types: Cigarettes    Quit date: 07/13/2002    Years since quitting: 20.2   Smokeless tobacco: Former  Building services engineer Use: Never used  Substance and Sexual Activity   Alcohol use: Yes     Alcohol/week: 3.0 standard drinks of alcohol    Types: 3 Cans of beer per week    Comment: 3 tmes weekly   Drug use: No   Sexual activity: Not on file  Other Topics Concern   Not on file  Social History Narrative   He and his first wife divorced in 20 14/2015.  He is now remarried and lives with his new wife.   He has 2 adult children-aged 18 and 3 (he had 3 children, 1 of his sons--actually the twin brother of 1 of the existing sons, died after complications of redo heart transplant following Adriamycin toxicity from leukemia as a child, he died at age 78.     He works in Multimedia programmer estate broker.-Self-employed.   He quit smoking in 2004 after 50+ pack year smoking history.  He occasionally drinks alcohol.   Social Determinants of Health   Financial Resource Strain: Low Risk  (10/14/2022)   Overall Financial Resource Strain (CARDIA)    Difficulty of Paying Living Expenses: Not hard at all  Food Insecurity: No Food Insecurity (10/19/2022)   Hunger Vital Sign    Worried About Running Out of Food in the Last Year: Never true    Ran Out of Food in the Last Year: Never true  Transportation Needs: No Transportation Needs (10/14/2022)   PRAPARE - Administrator, Civil Service (Medical): No    Lack of Transportation (Non-Medical): No  Physical Activity: Inactive (10/14/2022)   Exercise Vital Sign    Days of Exercise per Week: 0 days    Minutes of Exercise  per Session: 0 min  Stress: No Stress Concern Present (10/14/2022)   [employer] of Occupational Health - Occupational Stress Questionnaire    Feeling of Stress : Not at all  Social Connections: Unknown (10/14/2022)   Social Connection and Isolation Panel [NHANES]    Frequency of Communication with Friends and Family: More than three times a week    Frequency of Social Gatherings with Friends and Family: Once a week    Attends Religious Services: Not on Marketing executive or Organizations: Yes    Attends Probation officer: More than 4 times per year    Marital Status: Married    Tobacco Counseling Counseling given: Not Answered   Clinical Intake:  Pre-visit preparation completed: Yes  Pain : No/denies pain     Nutritional Status: BMI > 30  Obese Nutritional Risks: None Diabetes: Yes  How often do you need to have someone help you when you read instructions, pamphlets, or other written materials from your doctor or pharmacy?: 1 - Never  Diabetic? Yes Nutrition Risk Assessment:  Has the patient had any N/V/D within the last 2 months?  No  Does the patient have any non-healing wounds?  No  Has the patient had any unintentional weight loss or weight gain?  No   Diabetes:  Is the patient diabetic?  Yes  If diabetic, was a CBG obtained today?  No  Did the patient bring in their glucometer from home?  No  How often do you monitor your CBG's? Does not.   Financial Strains and Diabetes Management:  Are you having any financial strains with the device, your supplies or your medication? No .  Does the patient want to be seen by Chronic Care Management for management of their diabetes?  No  Would the patient like to be referred to a Nutritionist or for Diabetic Management?  No   Diabetic Exams:  Diabetic Eye Exam: Overdue for diabetic eye exam. Pt has been advised about the importance in completing this exam. Patient advised to call and schedule an eye exam. Diabetic Foot Exam: Overdue, Pt has been advised about the importance in completing this exam. Pt is scheduled for diabetic foot exam on next appointment.   Interpreter Needed?: No  Information entered by :: NAllen LPN   Activities of Daily Living    10/14/2022    9:20 AM  In your present state of health, do you have any difficulty performing the following activities:  Hearing? 1  Comment has hearing aids  Vision? 0  Difficulty concentrating or making decisions? 0  Walking or climbing stairs? 1  Dressing or  bathing? 0  Doing errands, shopping? 0  Preparing Food and eating ? N  Using the Toilet? N  In the past six months, have you accidently leaked urine? N  Do you have problems with loss of bowel control? N  Managing your Medications? N  Managing your Finances? N  Housekeeping or managing your Housekeeping? N    Patient Care Team: Nche, Bonna Gains, NP as PCP - General (Internal Medicine) Marykay Lex, MD as PCP - Cardiology (Cardiology) Marykay Lex, MD as Consulting Physician (Cardiology) Karie Soda, MD as Consulting Physician (General Surgery) Loreli Slot, MD as Consulting Physician (Cardiothoracic Surgery) Parrett, Virgel Bouquet, NP as Nurse Practitioner (Pulmonary Disease) Gaspar Cola, [facility] as Pharmacist (Pharmacist) Berkley Harvey, MD as Consulting Physician (Ophthalmology)  Indicate any recent Medical Services you may have received  from other than [facility] providers in the past year (date may be approximate).     Assessment:   This is a routine wellness examination for [facility].  Hearing/Vision screen Vision Screening - Comments:: Regular eye exams, Dr. Macie Burows, [facility]  Dietary issues and exercise activities discussed: Current Exercise Habits: The patient does not participate in regular exercise at present   Goals Addressed             This Visit's Progress    Patient Stated       10/19/2022, not to get any worse       Depression Screen    10/19/2022    8:48 AM 04/15/2022    9:32 AM 10/14/2021    9:55 AM 10/14/2021    9:53 AM 10/08/2020    9:51 AM 01/02/2020    1:09 PM 12/06/2018    8:33 AM  PHQ 2/9 Scores  PHQ - 2 Score 0 0 0 0 0 0 0    Fall Risk    10/14/2022    9:20 AM 10/14/2021    9:55 AM 10/08/2020    9:51 AM 01/02/2020    1:09 PM 07/04/2019    9:18 AM  Fall Risk   Falls in the past year? 0 0 0 0 0  Number falls in past yr: 0 0 0 0   Injury with Fall? 0 0 0 0   Risk for fall due to : Impaired balance/gait;Impaired  mobility;Medication side effect      Follow up Falls prevention discussed;Education provided;Falls evaluation completed Falls evaluation completed Falls prevention discussed      FALL RISK PREVENTION PERTAINING TO THE HOME:  Any stairs in or around the home? No  If so, are there any without handrails? N/a Home free of loose throw rugs in walkways, pet beds, electrical cords, etc? Yes  Adequate lighting in your home to reduce risk of falls? Yes   ASSISTIVE DEVICES UTILIZED TO PREVENT FALLS:  Life alert? No  Use of a cane, walker or w/c? Yes  Grab bars in the bathroom? Yes  Shower chair or bench in shower? Yes  Elevated toilet seat or a handicapped toilet? Yes   TIMED UP AND GO:  Was the test performed? No .      Cognitive Function:        10/19/2022    8:49 AM  6CIT Screen  What Year? 0 points  What month? 0 points  What time? 0 points  Count back from 20 0 points  Months in reverse 0 points  Repeat phrase 0 points  Total Score 0 points    Immunizations Immunization History  Administered Date(s) Administered   Fluad Quad(high Dose 65+) 07/03/2019, 04/09/2021, 04/27/2022   Influenza Whole 05/10/2007, 07/08/2010   Influenza,inj,Quad PF,6+ Mos 04/17/2018   Influenza-Unspecified 07/14/2011, 06/29/2013, 03/30/2014, 04/09/2015, 05/19/2016   PFIZER(Purple Top)SARS-COV-2 Vaccination 08/05/2019, 08/27/2019, 04/03/2020   Pneumococcal Conjugate-13 01/05/2012, 06/29/2013, 03/13/2014   Pneumococcal Polysaccharide-23 02/15/2013, 04/09/2015   Td 02/15/2013   Tdap 07/08/2010, 07/13/2013   Zoster, Live 07/21/2010, 02/15/2013    TDAP status: Up to date  Flu Vaccine status: Up to date  Pneumococcal vaccine status: Up to date  Covid-19 vaccine status: Completed vaccines  Qualifies for Shingles Vaccine? Yes   Zostavax completed Yes   Shingrix Completed?: No.    Education has been provided regarding the importance of this vaccine. Patient has been advised to call insurance  company to determine out of pocket expense if they have not yet  received this vaccine. Advised may also receive vaccine at local pharmacy or Health Dept. Verbalized acceptance and understanding.  Screening Tests Health Maintenance  Topic Date Due   Zoster Vaccines- Shingrix (1 of 2) Never done   OPHTHALMOLOGY EXAM  06/22/2020   FOOT EXAM  10/07/2021   COVID-19 Vaccine (4 - 2023-24 season) 03/13/2022   Diabetic kidney evaluation - Urine ACR  04/09/2022   HEMOGLOBIN A1C  04/09/2022   COLONOSCOPY (Pts 45-21yrs Insurance coverage will need to be confirmed)  08/06/2022   Diabetic kidney evaluation - eGFR measurement  10/08/2022   Medicare Annual Wellness (AWV)  10/15/2022   INFLUENZA VACCINE  02/11/2023   DTaP/Tdap/Td (4 - Td or Tdap) 07/14/2023   Pneumonia Vaccine 23+ Years old  Completed   Hepatitis C Screening  Completed   HPV VACCINES  Aged Out    Health Maintenance  Health Maintenance Due  Topic Date Due   Zoster Vaccines- Shingrix (1 of 2) Never done   OPHTHALMOLOGY EXAM  06/22/2020   FOOT EXAM  10/07/2021   COVID-19 Vaccine (4 - 2023-24 season) 03/13/2022   Diabetic kidney evaluation - Urine ACR  04/09/2022   HEMOGLOBIN A1C  04/09/2022   COLONOSCOPY (Pts 45-31yrs Insurance coverage will need to be confirmed)  08/06/2022   Diabetic kidney evaluation - eGFR measurement  10/08/2022   Medicare Annual Wellness (AWV)  10/15/2022    Colorectal cancer screening: No longer required.   Lung Cancer Screening: (Low Dose CT Chest recommended if Age 32-80 years, 30 pack-year currently smoking OR have quit w/in 15years.) does not qualify.   Lung Cancer Screening Referral: no  Additional Screening:  Hepatitis C Screening: does qualify; Completed 06/06/2018  Vision Screening: Recommended annual ophthalmology exams for early detection of glaucoma and other disorders of the eye. Is the patient up to date with their annual eye exam?  Yes  Who is the provider or what is the name of the  office in which the patient attends annual eye exams? Dr. Macie Burows If pt is not established with a provider, would they like to be referred to a provider to establish care? No .   Dental Screening: Recommended annual dental exams for proper oral hygiene  Community Resource Referral / Chronic Care Management: CRR required this visit?  No   CCM required this visit?  No      Plan:     I have personally reviewed and noted the following in the patient's chart:   Medical and social history Use of alcohol, tobacco or illicit drugs  Current medications and supplements including opioid prescriptions. Patient is not currently taking opioid prescriptions. Functional ability and status Nutritional status Physical activity Advanced directives List of other physicians Hospitalizations, surgeries, and ER visits in previous 12 months Vitals Screenings to include cognitive, depression, and falls Referrals and appointments  In addition, I have reviewed and discussed with patient certain preventive protocols, quality metrics, and best practice recommendations. A written personalized care plan for preventive services as well as general preventive health recommendations were provided to patient.     Barb Merino, LPN   07/18/1094   Nurse Notes: none  Due to this being a virtual visit, the after visit summary with patients personalized plan was offered to patient via mail or my-chart. Patient would like to access on my-chart

## 2022-10-22 DIAGNOSIS — M4726 Other spondylosis with radiculopathy, lumbar region: Secondary | ICD-10-CM | POA: Diagnosis not present

## 2022-10-22 DIAGNOSIS — M9903 Segmental and somatic dysfunction of lumbar region: Secondary | ICD-10-CM | POA: Diagnosis not present

## 2022-10-22 DIAGNOSIS — M9904 Segmental and somatic dysfunction of sacral region: Secondary | ICD-10-CM | POA: Diagnosis not present

## 2022-10-25 DIAGNOSIS — M545 Low back pain, unspecified: Secondary | ICD-10-CM | POA: Diagnosis not present

## 2022-10-25 DIAGNOSIS — M25551 Pain in right hip: Secondary | ICD-10-CM | POA: Diagnosis not present

## 2022-10-26 DIAGNOSIS — M9904 Segmental and somatic dysfunction of sacral region: Secondary | ICD-10-CM | POA: Diagnosis not present

## 2022-10-26 DIAGNOSIS — M4726 Other spondylosis with radiculopathy, lumbar region: Secondary | ICD-10-CM | POA: Diagnosis not present

## 2022-10-26 DIAGNOSIS — M9903 Segmental and somatic dysfunction of lumbar region: Secondary | ICD-10-CM | POA: Diagnosis not present

## 2022-10-27 DIAGNOSIS — M4802 Spinal stenosis, cervical region: Secondary | ICD-10-CM | POA: Diagnosis not present

## 2022-10-27 DIAGNOSIS — M5416 Radiculopathy, lumbar region: Secondary | ICD-10-CM | POA: Diagnosis not present

## 2022-10-27 DIAGNOSIS — Z6837 Body mass index (BMI) 37.0-37.9, adult: Secondary | ICD-10-CM | POA: Diagnosis not present

## 2022-11-05 ENCOUNTER — Other Ambulatory Visit (HOSPITAL_COMMUNITY): Payer: Self-pay | Admitting: Neurosurgery

## 2022-11-05 DIAGNOSIS — M5416 Radiculopathy, lumbar region: Secondary | ICD-10-CM

## 2022-11-05 DIAGNOSIS — M4802 Spinal stenosis, cervical region: Secondary | ICD-10-CM

## 2022-11-10 DIAGNOSIS — H04123 Dry eye syndrome of bilateral lacrimal glands: Secondary | ICD-10-CM | POA: Diagnosis not present

## 2022-11-10 DIAGNOSIS — H524 Presbyopia: Secondary | ICD-10-CM | POA: Diagnosis not present

## 2022-11-10 DIAGNOSIS — H43393 Other vitreous opacities, bilateral: Secondary | ICD-10-CM | POA: Diagnosis not present

## 2022-11-10 DIAGNOSIS — H52203 Unspecified astigmatism, bilateral: Secondary | ICD-10-CM | POA: Diagnosis not present

## 2022-11-10 DIAGNOSIS — H40213 Acute angle-closure glaucoma, bilateral: Secondary | ICD-10-CM | POA: Diagnosis not present

## 2022-11-10 DIAGNOSIS — Z7984 Long term (current) use of oral hypoglycemic drugs: Secondary | ICD-10-CM | POA: Diagnosis not present

## 2022-11-10 DIAGNOSIS — E119 Type 2 diabetes mellitus without complications: Secondary | ICD-10-CM | POA: Diagnosis not present

## 2022-11-10 DIAGNOSIS — H5203 Hypermetropia, bilateral: Secondary | ICD-10-CM | POA: Diagnosis not present

## 2022-11-10 LAB — HM DIABETES EYE EXAM

## 2022-11-13 ENCOUNTER — Ambulatory Visit (HOSPITAL_COMMUNITY)
Admission: RE | Admit: 2022-11-13 | Discharge: 2022-11-13 | Disposition: A | Discharge status: Home / Self Care | Payer: Medicare Other | Source: Ambulatory Visit | Attending: Neurosurgery | Admitting: Neurosurgery

## 2022-11-13 ENCOUNTER — Encounter (HOSPITAL_COMMUNITY): Payer: Self-pay

## 2022-11-13 DIAGNOSIS — M5416 Radiculopathy, lumbar region: Secondary | ICD-10-CM

## 2022-11-13 DIAGNOSIS — M4802 Spinal stenosis, cervical region: Secondary | ICD-10-CM | POA: Insufficient documentation

## 2022-11-16 DIAGNOSIS — Z6837 Body mass index (BMI) 37.0-37.9, adult: Secondary | ICD-10-CM | POA: Diagnosis not present

## 2022-11-16 DIAGNOSIS — M5416 Radiculopathy, lumbar region: Secondary | ICD-10-CM | POA: Diagnosis not present

## 2022-11-19 ENCOUNTER — Encounter: Payer: Self-pay | Admitting: Nurse Practitioner

## 2022-11-19 NOTE — Telephone Encounter (Signed)
Pt wife called in and appt scheduled for 11/20/22 1:00p

## 2022-11-20 ENCOUNTER — Encounter: Payer: Self-pay | Admitting: Nurse Practitioner

## 2022-11-20 ENCOUNTER — Ambulatory Visit (INDEPENDENT_AMBULATORY_CARE_PROVIDER_SITE_OTHER): Payer: Medicare Other | Admitting: Nurse Practitioner

## 2022-11-20 VITALS — BP 128/80 | HR 70 | Temp 98.2°F | Resp 16 | Ht 67.5 in | Wt 245.0 lb

## 2022-11-20 DIAGNOSIS — N182 Chronic kidney disease, stage 2 (mild): Secondary | ICD-10-CM | POA: Diagnosis not present

## 2022-11-20 DIAGNOSIS — E790 Hyperuricemia without signs of inflammatory arthritis and tophaceous disease: Secondary | ICD-10-CM | POA: Diagnosis not present

## 2022-11-20 DIAGNOSIS — M5441 Lumbago with sciatica, right side: Secondary | ICD-10-CM | POA: Diagnosis not present

## 2022-11-20 DIAGNOSIS — M25551 Pain in right hip: Secondary | ICD-10-CM

## 2022-11-20 DIAGNOSIS — E1169 Type 2 diabetes mellitus with other specified complication: Secondary | ICD-10-CM

## 2022-11-20 DIAGNOSIS — E785 Hyperlipidemia, unspecified: Secondary | ICD-10-CM | POA: Diagnosis not present

## 2022-11-20 DIAGNOSIS — R053 Chronic cough: Secondary | ICD-10-CM | POA: Diagnosis not present

## 2022-11-20 DIAGNOSIS — I700 Atherosclerosis of aorta: Secondary | ICD-10-CM | POA: Diagnosis not present

## 2022-11-20 DIAGNOSIS — I10 Essential (primary) hypertension: Secondary | ICD-10-CM | POA: Diagnosis not present

## 2022-11-20 DIAGNOSIS — R600 Localized edema: Secondary | ICD-10-CM

## 2022-11-20 DIAGNOSIS — R351 Nocturia: Secondary | ICD-10-CM | POA: Diagnosis not present

## 2022-11-20 DIAGNOSIS — G8929 Other chronic pain: Secondary | ICD-10-CM | POA: Diagnosis not present

## 2022-11-20 LAB — COMPREHENSIVE METABOLIC PANEL
ALT: 33 U/L (ref 0–53)
AST: 19 U/L (ref 0–37)
Albumin: 4 g/dL (ref 3.5–5.2)
Alkaline Phosphatase: 72 U/L (ref 39–117)
BUN: 25 mg/dL — ABNORMAL HIGH (ref 6–23)
CO2: 31 mEq/L (ref 19–32)
Calcium: 9.2 mg/dL (ref 8.4–10.5)
Chloride: 98 mEq/L (ref 96–112)
Creatinine, Ser: 1.11 mg/dL (ref 0.40–1.50)
GFR: 63.73 mL/min (ref >60.00)
Glucose, Bld: 103 mg/dL — ABNORMAL HIGH (ref 70–99)
Potassium: 4.9 mEq/L (ref 3.5–5.1)
Sodium: 137 mEq/L (ref 135–145)
Total Bilirubin: 0.5 mg/dL (ref 0.2–1.2)
Total Protein: 6.7 g/dL (ref 6.0–8.3)

## 2022-11-20 LAB — LDL CHOLESTEROL, DIRECT: Direct LDL: 47 mg/dL

## 2022-11-20 LAB — PSA: PSA: 1.28 ng/mL (ref 0.10–4.00)

## 2022-11-20 LAB — MICROALBUMIN / CREATININE URINE RATIO
Creatinine,U: 77.1 mg/dL
Microalb Creat Ratio: 0.9 mg/g (ref 0.0–30.0)
Microalb, Ur: <0.7 mg/dL (ref 0.0–1.9)

## 2022-11-20 LAB — HEMOGLOBIN A1C: Hgb A1c MFr Bld: 7.7 % — ABNORMAL HIGH (ref 4.6–6.5)

## 2022-11-20 LAB — URIC ACID: Uric Acid, Serum: 5.5 mg/dL (ref 4.0–7.8)

## 2022-11-20 MED ORDER — KETOROLAC TROMETHAMINE 30 MG/ML IJ SOLN
30.0000 mg | Freq: Once | INTRAMUSCULAR | Status: AC
Start: 2022-11-20 — End: 2022-11-20
  Administered 2022-11-20: 30 mg via INTRAMUSCULAR

## 2022-11-20 MED ORDER — DEXCOM G6 SENSOR MISC
11 refills | Status: DC
Start: 2022-11-20 — End: 2023-11-22

## 2022-11-20 MED ORDER — FUROSEMIDE 20 MG PO TABS: ORAL_TABLET | ORAL | 0 refills | Status: DC

## 2022-11-20 MED ORDER — FUROSEMIDE 20 MG PO TABS
ORAL_TABLET | ORAL | 0 refills | Status: DC
Start: 2022-11-20 — End: 2023-11-22

## 2022-11-20 MED ORDER — DEXCOM G6 RECEIVER DEVI
1.0000 [IU] | Freq: Once | 0 refills | Status: AC
Start: 2022-11-20 — End: 2022-11-20

## 2022-11-20 MED ORDER — GABAPENTIN 300 MG PO CAPS
300.0000 mg | ORAL_CAPSULE | Freq: Three times a day (TID) | ORAL | 5 refills | Status: DC
Start: 2022-11-20 — End: 2023-05-13

## 2022-11-20 MED ORDER — DEXCOM G6 TRANSMITTER MISC
3 refills | Status: DC
Start: 2022-11-20 — End: 2023-11-22

## 2022-11-20 NOTE — Assessment & Plan Note (Addendum)
Repeat CMP and urine microalbumin today: normal Chronic ankle edema, improves with elevation and use of furosemide. He is aware not to use HCTZ, if he has to take furosemide

## 2022-11-20 NOTE — Assessment & Plan Note (Signed)
Repeat lipid panel BP at goal Controlled Dm

## 2022-11-20 NOTE — Assessment & Plan Note (Addendum)
No glucose check at home No adverse effect with metformin Up to date with eye exam, s/p cataract removal without complication. Normal urine microalbumin Repeat hgbA1c , UACr, LDL, and CMP Maintain med dose He requested for dexcom meter rx

## 2022-11-20 NOTE — Assessment & Plan Note (Addendum)
No Sob or PND Wt Readings from Last 3 Encounters:  11/20/22 245 lb (111.1 kg)  10/19/22 240 lb (108.9 kg)  05/19/22 241 lb (109.3 kg)    Furosemide refill sent He is aware to hold HCTZ, if he has to take frurosemide

## 2022-11-20 NOTE — Progress Notes (Signed)
After obtaining consent, and per orders of [facility], injection of ketorolac 30 mg given by Olga Coaster. Patient instructed to remain in clinic for 20 minutes afterwards, and to report any adverse reaction to me immediately.

## 2022-11-20 NOTE — Progress Notes (Signed)
Established Patient Visit  Patient: Andres Taylor   DOB: 1945-09-18   77 y.o. Male  MRN: [id number] Visit Date: 11/20/2022  Subjective:    Chief Complaint  Patient presents with   Pain    Right leg pain-  sciatic nerve pain. Taking Gabapentin 300 3 x a day -  has a Mri June 18th -  Refill of Gabapentin    Accompanied by wife today  HPI Aortic atherosclerosis (HCC) Repeat lipid panel BP at goal Controlled Dm  CKD (chronic kidney disease) stage 2, GFR 60-89 ml/min Repeat CMP and urine microalbumin today Chronic ankle edema, improves with elevation and use of furosemide. He is aware not to use HCTZ, if he has to take furosemide  DM (diabetes mellitus) (HCC) No glucose check at home No adverse effect with metformin Up to date with eye exam, s/p cataract removal without complication. Normal urine microalbumin Repeat hgbA1c , UACr, LDL, and CMP Maintain med dose He requested for dexcom meter rx  Hyperuricemia No acute gout exacerbation in last 6months Current use of allopurinol, maintain dose Repeat uric acid and CMP  Leg edema No Sob or PND Wt Readings from Last 3 Encounters:  11/20/22 245 lb (111.1 kg)  10/19/22 240 lb (108.9 kg)  05/19/22 241 lb (109.3 kg)    Furosemide refill sent He is aware to hold HCTZ, if he has to take frurosemide  HTN (hypertension), benign BP at goal with olmesartan and HCTZ BP Readings from Last 3 Encounters:  11/20/22 128/80  05/19/22 128/86  04/27/22 137/70   Maintain med doses Repeat CMP  Hyperlipidemia associated with type 2 diabetes mellitus (HCC) No adverse effects with atorvastatin and lovaza Maintain med doses Repeat direct LDL (not fasting today)  Chronic right-sided low back pain with right-sided sciatica Acute of chronic pain, onset 1week ago, denies any injury or recent travel or immobilization. Associated with right hip pain, limited ROM in R. Hip, leg weakness, and inability to lay in supine  position. No saddle paresthesia, no change in GI/GU function. [facility] was [facility] ago. x-ray obtained and IM corticosteroid was administered. He report minimal improvement with injection. He was evaluated by [facility]: Dr. Coletta Memos 3days ago. MRI lumbar spine ordered and oxycodone 5mg  prescribed. He took 2tab of oxycodone at hs with no relief. He was unable to complete MRI lumbar spine due to need to lay in supine position. He is schedule to get MRI with general anesthesia on 12/29/2022. He also took expired gababpentin prescription 300mg  TID, no relief. Hx of DDD-cervical and lumbar spine. And severe OA of right hip. No previous surgical intervention.  Advised to stop expired medication and that dose was out of recommended range. Advised to Take oxycodone 5mg  every 8hrs x 3days, then as needed for severe pain Start gabapentin 300mg  every 8hrs continuously, new rx sent Ok to also use tylenol 650mg  every 8hrs as needed for mild pain. Oxycodone may cause constipation, so start colace 100mg  BID or miralax 17g daily. If you notice oversedation with gabapentin and oxycodone, use oxycodone only for severe pain. Call office or send mychart message if no improvement by Monday. Consider use of NSAID or oral prednisone if CMP and/hgbA1c is normal Maintain upcoming appt for MRI lumber spine.  Reviewed medical, surgical, and social history today  Medications: Outpatient Medications Prior to Visit  Medication Sig   allopurinol (ZYLOPRIM) 100  MG tablet TAKE 1 TABLET BY MOUTH DAILY   aspirin EC 81 MG tablet Take 81 mg by mouth daily.   atorvastatin (LIPITOR) 20 MG tablet TAKE 1 TABLET BY MOUTH DAILY   BREO ELLIPTA 200-25 MCG/ACT AEPB Inhale 1 puff into the lungs daily.   cetirizine (ZYRTEC) 10 MG chewable tablet Chew 10 mg by mouth daily.   Coenzyme Q-10 100 MG capsule Take 100 mg by mouth 2 (two) times daily.   diltiazem (CARDIZEM CD) 180 MG 24 hr  capsule TAKE 1 CAPSULE(180 MG) BY MOUTH DAILY   guaiFENesin (MUCINEX) 600 MG 12 hr tablet Take 600 mg by mouth daily.   hydrochlorothiazide (MICROZIDE) 12.5 MG capsule TAKE 1 CAPSULE BY MOUTH DAILY   ipratropium (ATROVENT) 0.06 % nasal spray USE 2 SPRAYS IN BOTH NOSTRILS IN THE MORNING AND AT BEDTIME   metFORMIN (GLUCOPHAGE) 850 MG tablet Take 1bid   methocarbamol (ROBAXIN) 500 MG tablet TAKE 1 TABLET BY MOUTH EVERY 12  HOURS   montelukast (SINGULAIR) 10 MG tablet Take 1 tablet (10 mg total) by mouth at bedtime.   Multiple Vitamin (MULTIVITAMIN) tablet Take 1 tablet by mouth daily.   olmesartan (BENICAR) 20 MG tablet TAKE 1 TABLET BY MOUTH  DAILY   omega-3 acid ethyl esters (LOVAZA) 1 g capsule TAKE 1 CAPSULE( 1 GRAM) BY MOUTH DAILY   PROAIR RESPICLICK 108 (90 Base) MCG/ACT AEPB INHALE 2 PUFFS EVERY 4-6 HOURS AS NEEDED FOR COUGH/ WHEEZE   [DISCONTINUED] furosemide (LASIX) 20 MG tablet TAKE 1 TABLET BY MOUTH AS NEEDED, DO NOT TAKE HCTZ THE DAY YOU USE MEDICATION AS NEEDED FOR SWELLING OR SHORTNESS OF BREATH   [DISCONTINUED] predniSONE (STERAPRED UNI-PAK 21 TAB) 10 MG (21) TBPK tablet Take as directed   colchicine 0.6 MG tablet Take 1 tablet (0.6 mg total) by mouth 2 (two) times daily as needed (for gout). PRN for Gout (Patient not taking: Reported on 10/19/2022)   No facility-administered medications prior to visit.   Reviewed past medical and social history.   ROS per HPI above      Objective:  BP 128/80 (BP Location: Left Arm, Patient Position: Sitting, Cuff Size: Large)   Pulse 70   Temp 98.2 F (36.8 C) (Temporal)   Resp 16   Ht 5' 7.5" (1.715 m)   Wt 245 lb (111.1 kg)   SpO2 97%   BMI 37.81 kg/m      Physical Exam  Results for orders placed or performed in visit on 11/20/22  HM DIABETES EYE EXAM  Result Value Ref Range   HM Diabetic Eye Exam No Retinopathy No Retinopathy      Assessment & Plan:    Problem List Items Addressed This Visit       Cardiovascular and  Mediastinum   HTN (hypertension), benign (Chronic)    BP at goal with olmesartan and HCTZ BP Readings from Last 3 Encounters:  11/20/22 128/80  05/19/22 128/86  04/27/22 137/70  Maintain med doses Repeat CMP      Relevant Medications   furosemide (LASIX) 20 MG tablet   Other Relevant Orders   Comprehensive metabolic panel   Aortic atherosclerosis (HCC)    Repeat lipid panel BP at goal Controlled Dm      Relevant Medications   furosemide (LASIX) 20 MG tablet   Other Relevant Orders   Direct LDL     Endocrine   DM (diabetes mellitus) (HCC)    No glucose check at home No adverse effect with metformin Up to  date with eye exam, s/p cataract removal without complication. Normal urine microalbumin Repeat hgbA1c , UACr, LDL, and CMP Maintain med dose He requested for dexcom meter rx      Relevant Medications   Continuous Glucose Receiver (DEXCOM G6 RECEIVER) DEVI   Continuous Glucose Transmitter (DEXCOM G6 TRANSMITTER) MISC   Continuous Glucose Sensor (DEXCOM G6 SENSOR) MISC   Other Relevant Orders   Hemoglobin A1c   Comprehensive metabolic panel   Microalbumin / creatinine urine ratio   Hyperlipidemia associated with type 2 diabetes mellitus (HCC)    No adverse effects with atorvastatin and lovaza Maintain med doses Repeat direct LDL (not fasting today)      Relevant Medications   furosemide (LASIX) 20 MG tablet   Other Relevant Orders   Comprehensive metabolic panel   Direct LDL     Nervous and Auditory   Chronic right-sided low back pain with right-sided sciatica - Primary    Acute of chronic pain, onset 1week ago, denies any injury or recent travel or immobilization. Associated with right hip pain, limited ROM in R. Hip, leg weakness, and inability to lay in supine position. No saddle paresthesia, no change in GI/GU function. He was evaluated at [facility] ago. x-ray obtained and IM corticosteroid was administered. He report minimal  improvement with injection. He was evaluated by [facility]: Dr. Coletta Memos 3days ago. MRI lumbar spine ordered and oxycodone 5mg  prescribed. He took 2tab of oxycodone at hs with no relief. He was unable to complete MRI lumbar spine due to need to lay in supine position. He is schedule to get MRI with general anesthesia on 12/29/2022. He also took expired gababpentin prescription 300mg  TID, no relief. Hx of DDD-cervical and lumbar spine. And severe OA of right hip. No previous surgical intervention.  Advised to stop expired medication and that dose was out of recommended range. Advised to Take oxycodone 5mg  every 8hrs x 3days, then as needed for severe pain Start gabapentin 300mg  every 8hrs continuously, new rx sent Ok to also use tylenol 650mg  every 8hrs as needed for mild pain. Oxycodone may cause constipation, so start colace 100mg  BID or miralax 17g daily. If you notice oversedation with gabapentin and oxycodone, use oxycodone only for severe pain. Call office or send mychart message if no improvement by Monday. Consider use of NSAID or oral prednisone if CMP and/hgbA1c is normal Maintain upcoming appt for MRI lumber spine.      Relevant Medications   gabapentin (NEURONTIN) 300 MG capsule     Genitourinary   CKD (chronic kidney disease) stage 2, GFR 60-89 ml/min    Repeat CMP and urine microalbumin today Chronic ankle edema, improves with elevation and use of furosemide. He is aware not to use HCTZ, if he has to take furosemide      Relevant Orders   Comprehensive metabolic panel   Microalbumin / creatinine urine ratio     Other   Leg edema (Chronic)    No Sob or PND Wt Readings from Last 3 Encounters:  11/20/22 245 lb (111.1 kg)  10/19/22 240 lb (108.9 kg)  05/19/22 241 lb (109.3 kg)    Furosemide refill sent He is aware to hold HCTZ, if he has to take frurosemide      Relevant Medications   furosemide (LASIX) 20 MG tablet   Hyperuricemia    No  acute gout exacerbation in last 6months Current use of allopurinol, maintain dose Repeat uric acid and CMP  Relevant Orders   Uric acid   Other Visit Diagnoses     Nocturia       Relevant Orders   PSA   Acute right hip pain       Relevant Medications   ketorolac (TORADOL) 30 MG/ML injection 30 mg (Completed)   gabapentin (NEURONTIN) 300 MG capsule      I have spent with this patient regarding history taking, documentation, review of labs, radiology, specialty note, formulating plan and discussing treatment options with patient.   Return in about 6 months (around 05/23/2023), or if symptoms worsen or fail to improve, for DM, HTN, hyperlipidemia (fasting).     Alysia Penna, NP

## 2022-11-20 NOTE — Assessment & Plan Note (Signed)
No adverse effects with atorvastatin and lovaza Repeat direct LDL (not fasting today): at goal Maintain med doses

## 2022-11-20 NOTE — Assessment & Plan Note (Signed)
BP at goal with olmesartan and HCTZ BP Readings from Last 3 Encounters:  11/20/22 128/80  05/19/22 128/86  04/27/22 137/70   Maintain med doses Repeat CMP

## 2022-11-20 NOTE — Assessment & Plan Note (Addendum)
Acute of chronic pain, onset 1week ago, denies any injury or recent travel or immobilization. Associated with right hip pain, limited ROM in R. Hip, leg weakness, and inability to lay in supine position. No saddle paresthesia, no change in GI/GU function. He was evaluated at [facility] ago. x-ray obtained and IM corticosteroid was administered. He report minimal improvement with injection. He was evaluated by [facility]: Dr. Coletta Memos 3days ago. MRI lumbar spine ordered and oxycodone 5mg  prescribed. He took 2tab of oxycodone at hs with no relief. He was unable to complete MRI lumbar spine due to need to lay in supine position. He is schedule to get MRI with general anesthesia on 12/29/2022. He also took expired gababpentin prescription 300mg  TID, no relief. Hx of DDD-cervical and lumbar spine. And severe OA of right hip. No previous surgical intervention.  Advised to stop expired medication and that dose was out of recommended range. Advised to Take oxycodone 5mg  every 8hrs x 3days, then as needed for severe pain Start gabapentin 300mg  every 8hrs continuously, new rx sent Ok to also use tylenol 650mg  every 8hrs as needed for mild pain. Oxycodone may cause constipation, so start colace 100mg  BID or miralax 17g daily. If you notice oversedation with gabapentin and oxycodone, use oxycodone only for severe pain. Call office or send mychart message if no improvement by Monday. Consider use of NSAID or oral prednisone if CMP and/hgbA1c is normal Maintain upcoming appt for MRI lumber spine.

## 2022-11-20 NOTE — Patient Instructions (Signed)
Take oxycodone 5mg  every 8hrs x 3days, then as needed for severe pain Start gabapentin 300mg  every 8hrs continuously Ok to also use tylenol 650mg  every 8hrs as needed for mild pain. Call office or send mychart message if no improvement by Monday Oxycodone may cause constipation, so start colace 100mg  BID or miralax 17g daily. If you notice oversedation with gabapentin and oxycodone, use oxycodone only for severe pain. Go to lab

## 2022-11-20 NOTE — Assessment & Plan Note (Signed)
No acute gout exacerbation in last 6months Current use of allopurinol, maintain dose Repeat uric acid and CMP: normal

## 2022-11-21 MED ORDER — DILTIAZEM HCL ER COATED BEADS 180 MG PO CP24
ORAL_CAPSULE | ORAL | 3 refills | Status: DC
Start: 2022-11-21 — End: 2023-05-25

## 2022-11-21 MED ORDER — MONTELUKAST SODIUM 10 MG PO TABS
10.0000 mg | ORAL_TABLET | Freq: Every day | ORAL | 0 refills | Status: DC
Start: 2022-11-21 — End: 2023-03-17

## 2022-11-21 MED ORDER — OMEGA-3-ACID ETHYL ESTERS 1 G PO CAPS
1.0000 g | ORAL_CAPSULE | Freq: Every day | ORAL | 3 refills | Status: DC
Start: 2022-11-21 — End: 2023-05-25

## 2022-11-21 MED ORDER — HYDROCHLOROTHIAZIDE 12.5 MG PO CAPS
12.5000 mg | ORAL_CAPSULE | Freq: Every day | ORAL | 1 refills | Status: DC
Start: 2022-11-21 — End: 2023-05-25

## 2022-11-21 MED ORDER — OLMESARTAN MEDOXOMIL 20 MG PO TABS: ORAL_TABLET | ORAL | 3 refills | Status: DC

## 2022-11-21 MED ORDER — METFORMIN HCL 850 MG PO TABS: 850.0000 mg | ORAL_TABLET | Freq: Two times a day (BID) | ORAL | 3 refills | Status: DC

## 2022-11-21 NOTE — Addendum Note (Signed)
Addended by: Alysia Penna L on: 11/21/2022 03:06 PM   Modules accepted: Orders

## 2022-11-23 ENCOUNTER — Encounter: Payer: Self-pay | Admitting: Nurse Practitioner

## 2022-11-23 DIAGNOSIS — G8929 Other chronic pain: Secondary | ICD-10-CM

## 2022-11-23 MED ORDER — NAPROXEN 500 MG PO TABS
500.0000 mg | ORAL_TABLET | Freq: Two times a day (BID) | ORAL | 0 refills | Status: DC
Start: 2022-11-23 — End: 2023-06-04

## 2022-11-25 ENCOUNTER — Encounter (HOSPITAL_COMMUNITY): Payer: Self-pay

## 2022-11-25 ENCOUNTER — Other Ambulatory Visit: Payer: Self-pay

## 2022-11-25 ENCOUNTER — Emergency Department (HOSPITAL_COMMUNITY)
Admission: EM | Admit: 2022-11-25 | Discharge: 2022-11-25 | Disposition: A | Discharge status: Home / Self Care | Payer: Medicare Other | Attending: Emergency Medicine | Admitting: Emergency Medicine

## 2022-11-25 ENCOUNTER — Emergency Department (HOSPITAL_COMMUNITY): Payer: Medicare Other

## 2022-11-25 DIAGNOSIS — M545 Low back pain, unspecified: Secondary | ICD-10-CM | POA: Diagnosis present

## 2022-11-25 DIAGNOSIS — I129 Hypertensive chronic kidney disease with stage 1 through stage 4 chronic kidney disease, or unspecified chronic kidney disease: Secondary | ICD-10-CM | POA: Insufficient documentation

## 2022-11-25 DIAGNOSIS — E1122 Type 2 diabetes mellitus with diabetic chronic kidney disease: Secondary | ICD-10-CM | POA: Insufficient documentation

## 2022-11-25 DIAGNOSIS — N182 Chronic kidney disease, stage 2 (mild): Secondary | ICD-10-CM | POA: Diagnosis not present

## 2022-11-25 DIAGNOSIS — M5441 Lumbago with sciatica, right side: Secondary | ICD-10-CM | POA: Insufficient documentation

## 2022-11-25 DIAGNOSIS — M4726 Other spondylosis with radiculopathy, lumbar region: Secondary | ICD-10-CM | POA: Diagnosis not present

## 2022-11-25 DIAGNOSIS — M5116 Intervertebral disc disorders with radiculopathy, lumbar region: Secondary | ICD-10-CM | POA: Diagnosis not present

## 2022-11-25 DIAGNOSIS — R32 Unspecified urinary incontinence: Secondary | ICD-10-CM | POA: Diagnosis not present

## 2022-11-25 DIAGNOSIS — J45909 Unspecified asthma, uncomplicated: Secondary | ICD-10-CM | POA: Diagnosis not present

## 2022-11-25 LAB — BASIC METABOLIC PANEL
Anion gap: 10 (ref 5–15)
BUN: 20 mg/dL (ref 8–23)
CO2: 29 mmol/L (ref 22–32)
Calcium: 9.5 mg/dL (ref 8.9–10.3)
Chloride: 96 mmol/L — ABNORMAL LOW (ref 98–111)
Creatinine, Ser: 1.02 mg/dL (ref 0.61–1.24)
GFR, Estimated: >60 mL/min (ref >60)
Glucose, Bld: 218 mg/dL — ABNORMAL HIGH (ref 70–99)
Potassium: 4.7 mmol/L (ref 3.5–5.1)
Sodium: 135 mmol/L (ref 135–145)

## 2022-11-25 LAB — URINALYSIS, ROUTINE W REFLEX MICROSCOPIC
Bilirubin Urine: NEGATIVE
Glucose, UA: NEGATIVE mg/dL
Hgb urine dipstick: NEGATIVE
Ketones, ur: NEGATIVE mg/dL
Leukocytes,Ua: NEGATIVE
Nitrite: NEGATIVE
Protein, ur: NEGATIVE mg/dL
Specific Gravity, Urine: 1.012 (ref 1.005–1.030)
pH: 5 (ref 5.0–8.0)

## 2022-11-25 LAB — CBC
HCT: 37.3 % — ABNORMAL LOW (ref 39.0–52.0)
Hemoglobin: 11.7 g/dL — ABNORMAL LOW (ref 13.0–17.0)
MCH: 28.3 pg (ref 26.0–34.0)
MCHC: 31.4 g/dL (ref 30.0–36.0)
MCV: 90.1 fL (ref 80.0–100.0)
Platelets: 250 10*3/uL (ref 150–400)
RBC: 4.14 MIL/uL — ABNORMAL LOW (ref 4.22–5.81)
RDW: 13.1 % (ref 11.5–15.5)
WBC: 8.5 10*3/uL (ref 4.0–10.5)
nRBC: 0 % (ref 0.0–0.2)

## 2022-11-25 MED ORDER — FENTANYL CITRATE PF 50 MCG/ML IJ SOSY
100.0000 ug | PREFILLED_SYRINGE | Freq: Once | INTRAMUSCULAR | Status: AC
  Administered 2022-11-25: 100 ug via INTRAVENOUS
  Filled 2022-11-25: qty 2

## 2022-11-25 MED ORDER — OXYCODONE-ACETAMINOPHEN 5-325 MG PO TABS
1.0000 | ORAL_TABLET | Freq: Once | ORAL | Status: AC
  Administered 2022-11-25: 1 via ORAL
  Filled 2022-11-25: qty 1

## 2022-11-25 MED ORDER — OXYCODONE-ACETAMINOPHEN 5-325 MG PO TABS: 1.0000 | ORAL_TABLET | Freq: Four times a day (QID) | ORAL | 0 refills | Status: DC | PRN

## 2022-11-25 MED ORDER — GADOBUTROL 1 MMOL/ML IV SOLN: 10.0000 mL | Freq: Once | INTRAVENOUS | Status: DC | PRN

## 2022-11-25 MED ORDER — SENNOSIDES-DOCUSATE SODIUM 8.6-50 MG PO TABS: 1.0000 | ORAL_TABLET | Freq: Every evening | ORAL | 0 refills | Status: DC | PRN

## 2022-11-25 MED ORDER — DICLOFENAC SODIUM 1 % EX GEL: 2.0000 g | Freq: Four times a day (QID) | CUTANEOUS | 0 refills | Status: DC

## 2022-11-25 MED ORDER — LORAZEPAM 2 MG/ML IJ SOLN: 1.0000 mg | Freq: Once | INTRAMUSCULAR | Status: DC | PRN

## 2022-11-25 NOTE — ED Notes (Signed)
Patient Alert and oriented to baseline. Stable and ambulatory to baseline. Patient verbalized understanding of the discharge instructions.  Patient belongings were taken by the patient.   

## 2022-11-25 NOTE — ED Triage Notes (Signed)
Pt arrives via POV from home. Pt was scheduled to have an outpatient MRI today of his Lumbar but could not get through the MRI due to the pain. Pt was scheduled to have an MRI with sedation on the June 18th. However, patient continues to have pain to his right hip and has lost control of his bladder the past few days. Pt AxOx4.

## 2022-11-25 NOTE — ED Provider Notes (Signed)
Emergency Department Provider Note   I have reviewed the triage vital signs and the nursing notes.   HISTORY  Chief Complaint Back Pain, Hip Pain, and Urinary Incontinence   HPI Andres Taylor is a 77 y.o. male with PMH of DDD presents the emergency department with worsening back and right leg pain.  Symptoms have been progressively worsening over the past 4 to 5 weeks.  He is following with Dr. Franky Macho who has ordered an outpatient MRI of his lumbar spine with/without contrast under general anesthesia.  He apparently tried to go for outpatient MRI in the past couple of weeks but was unable to lay flat due to pain in his leg.  He has an MRI scheduled for next month but none only has had worsening pain in the leg but does develop some urine incontinence.  No stool incontinence.  No groin numbness.  He does have some weakness in the right leg which is also been present for several weeks. No injury or fever.    Past Medical History:  Diagnosis Date   Abdominal bloating 03/13/2019   Allergic rhinitis    Allergy    Arthritis    Asthma    Blood in urine    Cataract    forming    Chronic cough    CKD (chronic kidney disease), stage II    DDD (degenerative disc disease)    Diabetes mellitus (HCC)    DOE (dyspnea on exertion)    Eustachian tube dysfunction    Fatty liver    GERD (gastroesophageal reflux disease)    Glaucoma    Gout    Hard of hearing    bilateral    Heme positive stool 03/13/2019   History of cardiomegaly 05/16/2019   Noted on CXR   History of kidney stones    Hyperlipidemia    Hypertension    Inguinal hernia    Bilateral   Joint pain, knee    Leg edema    Lung tumor (benign), right    Right upper lobe lung mass   Obesity    OSA on CPAP    Pneumonitis    right lung   Pulmonary HTN (HCC) 04/18/2013   Restrictive lung disease    Sciatica    Spinal stenosis    Wears contact lenses    Wears glasses    Wears hearing aid in both ears     Review  of Systems  Constitutional: No fever/chills Cardiovascular: Denies chest pain. Respiratory: Denies shortness of breath. Gastrointestinal: No abdominal pain.   Musculoskeletal: Positive for back pain and left leg pain.  Skin: Negative for rash. Neurological: Negative for headaches. Right leg pain/weakness and bladder incontinence.   ____________________________________________   PHYSICAL EXAM:  VITAL SIGNS: ED Triage Vitals  Enc Vitals Group     BP 11/25/22 1124 126/70     Pulse Rate 11/25/22 1124 73     Resp 11/25/22 1124 16     Temp 11/25/22 1124 97.8 F (36.6 C)     Temp Source 11/25/22 1124 Oral     SpO2 11/25/22 1124 96 %     Weight 11/25/22 1147 240 lb (108.9 kg)     Height 11/25/22 1147 5\' 7"  (1.702 m)   Constitutional: Alert and oriented. Well appearing and in no acute distress. Eyes: Conjunctivae are normal.  Head: Atraumatic. Nose: No congestion/rhinnorhea. Mouth/Throat: Mucous membranes are moist.  Neck: No stridor.  Cardiovascular: Normal rate, regular rhythm. Good peripheral circulation. Grossly normal heart  sounds.   Respiratory: Normal respiratory effort.  No retractions. Lungs CTAB. Gastrointestinal: Soft and nontender. No distention.  Musculoskeletal: No lower extremity tenderness nor edema. No gross deformities of extremities. Neurologic:  Normal speech and language.  Normal sensation the bilateral lower extremities with 4+/5 strength on the right compared to left.  Skin:  Skin is warm, dry and intact. No rash noted.  ____________________________________________   LABS (all labs ordered are listed, but only abnormal results are displayed)  Labs Reviewed  CBC - Abnormal; Notable for the following components:      Result Value   RBC 4.14 (*)    Hemoglobin 11.7 (*)    HCT 37.3 (*)    All other components within normal limits  BASIC METABOLIC PANEL - Abnormal; Notable for the following components:   Chloride 96 (*)    Glucose, Bld 218 (*)    All  other components within normal limits  URINALYSIS, ROUTINE W REFLEX MICROSCOPIC   ____________________________________________  RADIOLOGY  No results found.  ____________________________________________   PROCEDURES  Procedure(s) performed:   Procedures   ____________________________________________   INITIAL IMPRESSION / ASSESSMENT AND PLAN / ED COURSE  Pertinent labs & imaging results that were available during my care of the patient were reviewed by me and considered in my medical decision making (see chart for details).   This patient is Presenting for Evaluation of back pain, which does require a range of treatment options, and is a complaint that involves a high risk of morbidity and mortality.  The Differential Diagnoses includes but is not exclusive to musculoskeletal back pain, renal colic, urinary tract infection, pyelonephritis, intra-abdominal causes of back pain, aortic aneurysm or dissection, cauda equina syndrome, sciatica, lumbar disc disease, thoracic disc disease, etc.   Critical Interventions-    Medications  LORazepam (ATIVAN) injection 1 mg (has no administration in time range)  fentaNYL (SUBLIMAZE) injection 100 mcg (has no administration in time range)    Reassessment after intervention: symptoms improved.    I did obtain Additional Historical Information from wife at bedside.   I decided to review pertinent External Data, and in summary patient with MRI lumbar spine w and w/o contrast ordered for 12/29/22.   Clinical Laboratory Tests Ordered, included UA without infection.  No acute kidney injury.  CBC without leukocytosis.  Radiologic Tests Ordered, included ***. I independently interpreted the images and agree with radiology interpretation.   Cardiac Monitor Tracing which shows NSR.    Social Determinants of Health Risk patient is a non-smoker.   Consult complete with  Medical Decision Making: Summary:  Patient presents emergency  department for evaluation of worsening lower back pain and now some urine incontinence.  Plan for MRI lumbar spine here today.  He failed outpatient MRI but had no medication for anxiety or pain prior to the scan.  Unsure if he will truly need sedation/general anesthesia for MRI or not.  Plan to try scan here with meds and reassess.   Reevaluation with update and discussion with   ***Considered admission***  Patient's presentation is most consistent with acute presentation with potential threat to life or bodily function.   Disposition:   ____________________________________________  FINAL CLINICAL IMPRESSION(S) / ED DIAGNOSES  Final diagnoses:  None     NEW OUTPATIENT MEDICATIONS STARTED DURING THIS VISIT:  New Prescriptions   No medications on file    Note:  This document was prepared using Dragon voice recognition software and may include unintentional dictation errors.  Alona Bene, MD, [facility]

## 2022-11-25 NOTE — Discharge Instructions (Signed)

## 2022-11-27 DIAGNOSIS — M5412 Radiculopathy, cervical region: Secondary | ICD-10-CM | POA: Diagnosis not present

## 2022-11-27 DIAGNOSIS — M5416 Radiculopathy, lumbar region: Secondary | ICD-10-CM | POA: Diagnosis not present

## 2022-12-01 DIAGNOSIS — M5416 Radiculopathy, lumbar region: Secondary | ICD-10-CM | POA: Diagnosis not present

## 2022-12-03 ENCOUNTER — Ambulatory Visit: Payer: Medicare Other | Admitting: Nurse Practitioner

## 2022-12-07 ENCOUNTER — Other Ambulatory Visit: Payer: Self-pay | Admitting: Nurse Practitioner

## 2022-12-07 DIAGNOSIS — M5416 Radiculopathy, lumbar region: Secondary | ICD-10-CM

## 2022-12-10 DIAGNOSIS — M5416 Radiculopathy, lumbar region: Secondary | ICD-10-CM | POA: Diagnosis not present

## 2022-12-10 NOTE — Telephone Encounter (Signed)
LVM to CB Need clarification on how he took tramadol when it was initially prescribed.

## 2022-12-21 DIAGNOSIS — M5416 Radiculopathy, lumbar region: Secondary | ICD-10-CM | POA: Diagnosis not present

## 2022-12-21 DIAGNOSIS — M4692 Unspecified inflammatory spondylopathy, cervical region: Secondary | ICD-10-CM | POA: Diagnosis not present

## 2022-12-22 ENCOUNTER — Ambulatory Visit (HOSPITAL_COMMUNITY): Payer: Medicare Other

## 2022-12-29 ENCOUNTER — Other Ambulatory Visit (HOSPITAL_COMMUNITY): Payer: Medicare Other

## 2022-12-29 ENCOUNTER — Ambulatory Visit: Admit: 2022-12-29 | Payer: Medicare Other

## 2022-12-29 DIAGNOSIS — M25532 Pain in left wrist: Secondary | ICD-10-CM | POA: Diagnosis not present

## 2022-12-29 DIAGNOSIS — M25512 Pain in left shoulder: Secondary | ICD-10-CM | POA: Diagnosis not present

## 2022-12-29 DIAGNOSIS — M25561 Pain in right knee: Secondary | ICD-10-CM | POA: Diagnosis not present

## 2022-12-29 SURGERY — MRI WITH ANESTHESIA
Anesthesia: General

## 2023-01-21 DIAGNOSIS — M542 Cervicalgia: Secondary | ICD-10-CM | POA: Diagnosis not present

## 2023-01-21 DIAGNOSIS — M47812 Spondylosis without myelopathy or radiculopathy, cervical region: Secondary | ICD-10-CM | POA: Diagnosis not present

## 2023-01-27 ENCOUNTER — Other Ambulatory Visit: Payer: Self-pay

## 2023-01-27 ENCOUNTER — Ambulatory Visit: Payer: Medicare Other | Attending: Orthopedic Surgery

## 2023-01-27 DIAGNOSIS — R293 Abnormal posture: Secondary | ICD-10-CM | POA: Insufficient documentation

## 2023-01-27 DIAGNOSIS — M436 Torticollis: Secondary | ICD-10-CM | POA: Diagnosis not present

## 2023-01-27 DIAGNOSIS — R29898 Other symptoms and signs involving the musculoskeletal system: Secondary | ICD-10-CM | POA: Insufficient documentation

## 2023-01-27 NOTE — Therapy (Signed)
OUTPATIENT PHYSICAL THERAPY CERVICAL EVALUATION   Patient Name: Andres Taylor MRN: [id number] DOB:05/22/1946, 77 y.o., male Today's Date: 01/27/2023  END OF SESSION:  PT End of Session - 01/27/23 0927     Visit Number 1    Date for PT Re-Evaluation 04/21/23    Progress Note Due on Visit 10    PT Start Time 0928    PT Stop Time 1015    PT Time Calculation (min) 47 min    Activity Tolerance Patient tolerated treatment well    Behavior During Therapy [facility] for tasks assessed/performed             Past Medical History:  Diagnosis Date   Abdominal bloating 03/13/2019   Allergic rhinitis    Allergy    Arthritis    Asthma    Blood in urine    Cataract    forming    Chronic cough    CKD (chronic kidney disease), stage II    DDD (degenerative disc disease)    Diabetes mellitus (HCC)    DOE (dyspnea on exertion)    Eustachian tube dysfunction    Fatty liver    GERD (gastroesophageal reflux disease)    Glaucoma    Gout    Hard of hearing    bilateral    Heme positive stool 03/13/2019   History of cardiomegaly 05/16/2019   Noted on CXR   History of kidney stones    Hyperlipidemia    Hypertension    Inguinal hernia    Bilateral   Joint pain, knee    Leg edema    Lung tumor (benign), right    Right upper lobe lung mass   Obesity    OSA on CPAP    Pneumonitis    right lung   Pulmonary HTN (HCC) 04/18/2013   Restrictive lung disease    Sciatica    Spinal stenosis    Wears contact lenses    Wears glasses    Wears hearing aid in both ears    Past Surgical History:  Procedure Laterality Date   APPENDECTOMY  1976   cataract surgery Bilateral    march and april 2023   COLONOSCOPY  08/2010   COLONOSCOPY  08/07/2019   HEMORRHOID SURGERY  2001   INGUINAL HERNIA REPAIR Left 1948   as a baby 42 months old   INGUINAL HERNIA REPAIR Bilateral 09/21/2019   Procedure: LAPAROSCOPIC BILATERAL INGUINAL HERNIA REPAIR, LYSIS OF ADHESIONS;  Surgeon: Karie Soda, MD;   Location: [facility];  Service: General;  Laterality: Bilateral;   LEFT AND RIGHT HEART CATHETERIZATION WITH CORONARY ANGIOGRAM N/A 04/20/2013   Procedure: LEFT AND RIGHT HEART CATHETERIZATION WITH CORONARY ANGIOGRAM;  Surgeon: Marykay Lex, MD;  Location: [facility] CATH LAB;  Angiographically normal Coronary Arteries - L Dom Cx (OM1, small LPL1 then LPL2 & PDA) & wraparound LAD (D1-3 small).  Borderline CO/CI - 4.94/2.16 by both Fick & TD.  RAP ~14 mmHg, RVEDP 15 mmHg.  LVEDP 19 mmHg (->diastolic dysfunction) PCWP 16 mmHg, but PA Mean 25 mmHg   LITHOTRIPSY     x 2   LOBECTOMY Right    lumbar disectomy L4-5  1980   NM MYOVIEW LTD  06/15/2019    EF 70%.  No ischemia or infarction.  LOW RISK    TRANSTHORACIC ECHOCARDIOGRAM  04/21/2019   EF 60 to 65%.  Mild LVH.  Normal RV.  Normal atrial size.  Mild aortic sclerosis.  No stenosis. Lipomatous intra-atrial septum  UPPER GI ENDOSCOPY  08/07/2019   VIDEO ASSISTED THORACOSCOPY (VATS)/WEDGE RESECTION Right 08/30/2013   Procedure: VIDEO ASSISTED THORACOSCOPY (VATS)/WEDGE RESECTION;  Surgeon: Loreli Slot, MD;  Location: [facility] OR;  Service: Thoracic;  Laterality: Right;   Patient Active Problem List   Diagnosis Date Noted   Chronic right-sided low back pain with right-sided sciatica 11/20/2022   Epigastric pain 04/27/2022   Aortic atherosclerosis (HCC) 10/07/2021   Allergic rhinitis 10/02/2021   Difficult airway for intubation 09/21/2019   Bilateral inguinal hernia (BIH) 09/21/2019   Asthma 05/25/2019   Generalized abdominal pain 03/13/2019   Chronic cough 09/08/2018   Spinal stenosis in cervical region 06/07/2018   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   CKD (chronic kidney disease) stage 2, GFR 60-89 ml/min 05/17/2018   OSA on CPAP 05/17/2018   Restrictive lung disease 05/17/2018   Cervical radiculopathy 05/17/2018   Diabetic neuropathy associated with type 2 diabetes mellitus (HCC)  05/17/2018   Hyperuricemia 05/17/2018   Dermatochalasis of both upper eyelids 02/25/2017   Acute angle-closure glaucoma of both eyes 12/04/2016   DM (diabetes mellitus) (HCC) 12/01/2016   Hyperopia with astigmatism and presbyopia, bilateral 12/01/2016   Nuclear sclerotic cataract of left eye 12/01/2016   Posterior subcapsular age-related cataract of both eyes 12/01/2016   Vitreous floater, bilateral 12/01/2016   Scarring of lung 08/30/2013   Microvascular angina 04/18/2013   Pre-operative cardiovascular examination 04/18/2013   Dyspnea on exertion 04/17/2013   Leg edema    Hyperlipidemia associated with type 2 diabetes mellitus (HCC)    HTN (hypertension), benign     PCP: Conni Elliot, NP  REFERRING PROVIDER: Estill Bamberg, MD  REFERRING DIAG: cervical spine stenosis, lumbar spinal stenosis and radiculopathy  THERAPY DIAG:  Abnormal posture  Stiffness of cervical spine  Weakness of both legs  Rationale for Evaluation and Treatment: Rehabilitation  ONSET DATE: chronic  SUBJECTIVE:                                                                                                                                                                                                         SUBJECTIVE STATEMENT: Poor sleep last night.  R foot drop/ numbness R foot since 2009, generally Hand dominance: Right  PERTINENT HISTORY:  Chronic R LE radiculopathy, L5/S1 transforaminal steroid injection on R  on 12/10/22, with good relief of R LE radiculopathy.  cervical spine injections 2 weeks ago , didn't help as much as the lumbar injection. Referred to PT by orthopedist to address both neck and back pain and stiffness  PAIN:  Are you having pain? Yes: NPRS scale: 2/10 Pain location: stiff , sore muscles in neck and B upper traps Pain description: stiffness primarily  Aggravating factors: no specific aggravating positions or movements Relieving factors: Tylenol  PRECAUTIONS:  None  RED FLAGS: None     WEIGHT BEARING RESTRICTIONS: No  FALLS:  Has patient fallen in last 6 months? No  LIVING ENVIRONMENT: Lives with: lives with their spouse Lives in: House/apartment Stairs: Yes: Internal: 11 steps; on left going up Has following equipment at home: Dan Humphreys - 2 wheeled and [employer] - 4 wheeled  OCCUPATION: retired   PLOF: Independent and Needs assistance with homemaking  PATIENT GOALS: improve stiffness, soreness neck and back  NEXT MD VISIT: 8 weeks  OBJECTIVE:   DIAGNOSTIC FINDINGS:  1. Severe multilevel degenerative changes with osseous fusion across nearly every lumbar vertebral body level. There is severe spinal canal stenosis at L1-L4 secondary to a combination congenitally small pedicles degenerative disc disease, and ligamentum flavum hypertrophy 2. Multilevel neural foraminal narrowing, severe bilaterally at L5-S1.     Electronically Signed   By: Lorenza Cambridge M.D.   On: 11/25/2022 14:57  IMPRESSION: 1. 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 2. Advanced foraminal impingement bilaterally at C3-4 to C6-7. 3. Advanced facet osteoarthritis on the right at C1-2 with marrow edema and right C2 impingement. 4. Ankylosis at C2-3, C3-4, and C5-6.     Electronically Signed   By: Tiburcio Pea M.D.   On: 11/19/2022 07:53  PATIENT SURVEYS:  NDI 21/50  COGNITION: Overall cognitive status: Within functional limits for tasks assessed  SENSATION: Light touch: Impaired  R lower leg and foot   POSTURE: flexed trunk , B hips flexed to 20 degrees with forward trunk lean, sloped , rounded B shoulders  PALPATION: Stiff, tender B upper traps, B levator scapulae Minimal jt mobility cervical spine for lateral glides and for cervical distraction   CERVICAL ROM:   Active ROM A/PROM (deg) eval  Flexion 40  Extension 25  Right  lateral flexion   Left lateral flexion   Right rotation 30  Left rotation 0   (Blank rows = not tested) THORACIC SPINE ROM:   rotation 70 % B, FB 70 %, ext 20% LS ROM NT today UPPER EXTREMITY ROM:  Active ROM Right eval Left eval  Shoulder flexion 115   Shoulder extension    Shoulder abduction 110   Shoulder adduction    Shoulder extension    Shoulder internal rotation Behind back to t 11 Behind back to t 11  Shoulder external rotation    Elbow flexion    Elbow extension    Wrist flexion    Wrist extension    Wrist ulnar deviation    Wrist radial deviation    Wrist pronation    Wrist supination     (Blank rows = not tested) LOWER EXTREMITY ROM: B Hip flexion 95, IR 5 degrees, extension NT, B knees, ankles wnl UPPER EXTREMITY MMT:  MMT Right eval Left eval  Shoulder flexion    Shoulder extension    Shoulder abduction 3-   Shoulder adduction    Shoulder extension    Shoulder internal rotation    Shoulder external rotation 4   Middle trapezius    Lower trapezius    Elbow flexion 4-   Elbow extension    Wrist flexion    Wrist extension  Wrist ulnar deviation    Wrist radial deviation    Wrist pronation    Wrist supination    Grip strength     (Blank rows = wfl) LE MMT:  R ankle dorsiflexion gross strength B LE's 4/5 throughout  CERVICAL SPECIAL TESTS:  Spurling's test: Negative and Distraction test: Negative  FUNCTIONAL TESTS:  10 meter walk test: 8 sec or 0.8 m/sec  GAIT: decreased stance time on L, circumducts R LE in swing phase  TODAY'S TREATMENT:                                                                                                                              DATE:01/27/23:  PT evaluation, instructed in HEP as described below, to improve ease of achieving upright posture  PATIENT EDUCATION:  Education details: POC, goals  Person educated: Patient Education method: Explanation, Demonstration, Tactile cues, and Verbal cues Education  comprehension: verbalized understanding, returned demonstration, and verbal cues required  HOME EXERCISE PROGRAM: Access Code: [email or web address] URL: [email or web address] Date: 01/27/2023 Prepared by: Erron Wengert  Exercises - Supine Lower Trunk Rotation  - 1 x daily - 7 x weekly - 3 sets - 10 reps - Standing shoulder flexion wall slides  - 1 x daily - 7 x weekly - 3 sets - 10 reps - Doorway Pec Stretch at 90 Degrees Abduction  - 1 x daily - 7 x weekly - 3 sets - 10 reps - Hooklying Single Knee to Chest Stretch  - 1 x daily - 7 x weekly - 3 sets - 10 reps  ASSESSMENT:  CLINICAL IMPRESSION: Patient is a 77 y.o. male who was seen today for physical therapy evaluation and treatment for chronic cervical spine pain and stiffness as well as chronic lower back pain and R LE radiculopathy.His cervical spine stiffness today is his most prevalent issue.  Has chronic R foot/ ankle numbness and weakness , posture is very flexed particularly through B ant hips. Also quite stiff cervical spine.  R UE weakness ER, biceps, shoulder abd.  Has altered gait with decreased gait speed, uses walker frequently at home. He should benefit from skilled PT to address his deficits and address his function. Today initiated home program to effect his posture, to try to lengthen, improve thoracic spine and B hip extension.    OBJECTIVE IMPAIRMENTS: Abnormal gait, decreased balance, difficulty walking, decreased ROM, decreased strength, impaired flexibility, impaired sensation, postural dysfunction, and pain.   ACTIVITY LIMITATIONS: carrying, lifting, squatting, stairs, transfers, and locomotion level  PARTICIPATION LIMITATIONS: community activity, occupation, and yard work  PERSONAL FACTORS: Age, Behavior pattern, Fitness, Past/current experiences, Time since onset of injury/illness/exacerbation, and 3+ comorbidities: Dm, restrictive lung disease, cervical spinal stenosis, lumbar stenosis and radiculopathy  are also  affecting patient's functional outcome.   REHAB POTENTIAL: Good  CLINICAL DECISION MAKING: Evolving/moderate complexity  EVALUATION COMPLEXITY: Moderate   GOALS: Goals reviewed with patient? Yes  SHORT TERM GOALS: Target date: 2 weeks 02/10/23  I HEP Baseline: initiated today Goal status: INITIAL   LONG TERM GOALS: Target date: 04/21/23  Improve gait speed to greater than 1 m/sec for improved community ambulation Baseline: 0.8 m/sec Goal status: INITIAL  2.  NDI improve from 21/50 to 11/50 Baseline:  Goal status: INITIAL  3.  Improve B LE strength to 5/5 with MMT except dorsiflexion R ankle Baseline: 4/5 gross Goal status: INITIAL    PLAN:  PT FREQUENCY: 1-2x/week  PT DURATION: 8 weeks  PLANNED INTERVENTIONS: Therapeutic exercises, Therapeutic activity, Neuromuscular re-education, Balance training, Gait training, Patient/Family education, Self Care, and Joint mobilization  PLAN FOR NEXT SESSION: review home program, progress as needed with additional periscapular strengthening, dry needling, manual techniques as indicated.   Early Chars, PT, DPT Board-certified Specialist in Orthopaedic Physical Therapy  01/27/2023, 4:20 PM

## 2023-01-28 NOTE — Therapy (Signed)
OUTPATIENT PHYSICAL THERAPY CERVICAL TREATMENT   Patient Name: Andres Taylor MRN: [id number] DOB:April 13, 1946, 77 y.o., male Today's Date: 01/29/2023  END OF SESSION:  PT End of Session - 01/29/23 0814     Visit Number 2    Date for PT Re-Evaluation 04/21/23    Progress Note Due on Visit 10    PT Start Time 0815    PT Stop Time 0900    PT Time Calculation (min) 45 min    Activity Tolerance Patient tolerated treatment well    Behavior During Therapy [facility] for tasks assessed/performed              Past Medical History:  Diagnosis Date   Abdominal bloating 03/13/2019   Allergic rhinitis    Allergy    Arthritis    Asthma    Blood in urine    Cataract    forming    Chronic cough    CKD (chronic kidney disease), stage II    DDD (degenerative disc disease)    Diabetes mellitus (HCC)    DOE (dyspnea on exertion)    Eustachian tube dysfunction    Fatty liver    GERD (gastroesophageal reflux disease)    Glaucoma    Gout    Hard of hearing    bilateral    Heme positive stool 03/13/2019   History of cardiomegaly 05/16/2019   Noted on CXR   History of kidney stones    Hyperlipidemia    Hypertension    Inguinal hernia    Bilateral   Joint pain, knee    Leg edema    Lung tumor (benign), right    Right upper lobe lung mass   Obesity    OSA on CPAP    Pneumonitis    right lung   Pulmonary HTN (HCC) 04/18/2013   Restrictive lung disease    Sciatica    Spinal stenosis    Wears contact lenses    Wears glasses    Wears hearing aid in both ears    Past Surgical History:  Procedure Laterality Date   APPENDECTOMY  1976   cataract surgery Bilateral    march and april 2023   COLONOSCOPY  08/2010   COLONOSCOPY  08/07/2019   HEMORRHOID SURGERY  2001   INGUINAL HERNIA REPAIR Left 1948   as a baby 30 months old   INGUINAL HERNIA REPAIR Bilateral 09/21/2019   Procedure: LAPAROSCOPIC BILATERAL INGUINAL HERNIA REPAIR, LYSIS OF ADHESIONS;  Surgeon: Karie Soda, MD;   Location: [facility];  Service: General;  Laterality: Bilateral;   LEFT AND RIGHT HEART CATHETERIZATION WITH CORONARY ANGIOGRAM N/A 04/20/2013   Procedure: LEFT AND RIGHT HEART CATHETERIZATION WITH CORONARY ANGIOGRAM;  Surgeon: Marykay Lex, MD;  Location: [facility] CATH LAB;  Angiographically normal Coronary Arteries - L Dom Cx (OM1, small LPL1 then LPL2 & PDA) & wraparound LAD (D1-3 small).  Borderline CO/CI - 4.94/2.16 by both Fick & TD.  RAP ~14 mmHg, RVEDP 15 mmHg.  LVEDP 19 mmHg (->diastolic dysfunction) PCWP 16 mmHg, but PA Mean 25 mmHg   LITHOTRIPSY     x 2   LOBECTOMY Right    lumbar disectomy L4-5  1980   NM MYOVIEW LTD  06/15/2019    EF 70%.  No ischemia or infarction.  LOW RISK    TRANSTHORACIC ECHOCARDIOGRAM  04/21/2019   EF 60 to 65%.  Mild LVH.  Normal RV.  Normal atrial size.  Mild aortic sclerosis.  No stenosis. Lipomatous intra-atrial  septum   UPPER GI ENDOSCOPY  08/07/2019   VIDEO ASSISTED THORACOSCOPY (VATS)/WEDGE RESECTION Right 08/30/2013   Procedure: VIDEO ASSISTED THORACOSCOPY (VATS)/WEDGE RESECTION;  Surgeon: Loreli Slot, MD;  Location: [facility];  Service: Thoracic;  Laterality: Right;   Patient Active Problem List   Diagnosis Date Noted   Chronic right-sided low back pain with right-sided sciatica 11/20/2022   Epigastric pain 04/27/2022   Aortic atherosclerosis (HCC) 10/07/2021   Allergic rhinitis 10/02/2021   Difficult airway for intubation 09/21/2019   Bilateral inguinal hernia (BIH) 09/21/2019   Asthma 05/25/2019   Generalized abdominal pain 03/13/2019   Chronic cough 09/08/2018   Spinal stenosis in cervical region 06/07/2018   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   CKD (chronic kidney disease) stage 2, GFR 60-89 ml/min 05/17/2018   OSA on CPAP 05/17/2018   Restrictive lung disease 05/17/2018   Cervical radiculopathy 05/17/2018   Diabetic neuropathy associated with type 2 diabetes mellitus (HCC)  05/17/2018   Hyperuricemia 05/17/2018   Dermatochalasis of both upper eyelids 02/25/2017   Acute angle-closure glaucoma of both eyes 12/04/2016   DM (diabetes mellitus) (HCC) 12/01/2016   Hyperopia with astigmatism and presbyopia, bilateral 12/01/2016   Nuclear sclerotic cataract of left eye 12/01/2016   Posterior subcapsular age-related cataract of both eyes 12/01/2016   Vitreous floater, bilateral 12/01/2016   Scarring of lung 08/30/2013   Microvascular angina 04/18/2013   Pre-operative cardiovascular examination 04/18/2013   Dyspnea on exertion 04/17/2013   Leg edema    Hyperlipidemia associated with type 2 diabetes mellitus (HCC)    HTN (hypertension), benign     PCP: Conni Elliot, NP  REFERRING PROVIDER: Estill Bamberg, MD  REFERRING DIAG: cervical spine stenosis, lumbar spinal stenosis and radiculopathy  THERAPY DIAG:  Abnormal posture  Stiffness of cervical spine  Weakness of both legs  Rationale for Evaluation and Treatment: Rehabilitation  ONSET DATE: chronic  SUBJECTIVE:                                                                                                                                                                                                         SUBJECTIVE STATEMENT: Always have pain in my neck. It is sore and stiff.   Hand dominance: Right  PERTINENT HISTORY:  Chronic R LE radiculopathy, L5/S1 transforaminal steroid injection on R  on 12/10/22, with good relief of R LE radiculopathy.  cervical spine injections 2 weeks ago , didn't help as much as the lumbar injection. Referred to PT by orthopedist to address both neck and back pain  and stiffness  PAIN:  Are you having pain? Yes: NPRS scale: 4/10 Pain location: stiff , sore muscles in neck and B upper traps Pain description: stiffness primarily  Aggravating factors: no specific aggravating positions or movements Relieving factors: Tylenol  PRECAUTIONS: None  RED  FLAGS: None     WEIGHT BEARING RESTRICTIONS: No  FALLS:  Has patient fallen in last 6 months? No  LIVING ENVIRONMENT: Lives with: lives with their spouse Lives in: House/apartment Stairs: Yes: Internal: 11 steps; on left going up Has following equipment at home: Dan Humphreys - 2 wheeled and [employer] - 4 wheeled  OCCUPATION: retired   PLOF: Independent and Needs assistance with homemaking  PATIENT GOALS: improve stiffness, soreness neck and back  NEXT MD VISIT: 8 weeks  OBJECTIVE:   DIAGNOSTIC FINDINGS:  1. Severe multilevel degenerative changes with osseous fusion across nearly every lumbar vertebral body level. There is severe spinal canal stenosis at L1-L4 secondary to a combination congenitally small pedicles degenerative disc disease, and ligamentum flavum hypertrophy 2. Multilevel neural foraminal narrowing, severe bilaterally at L5-S1.     Electronically Signed   By: Lorenza Cambridge M.D.   On: 11/25/2022 14:57  IMPRESSION: 1. 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 2. Advanced foraminal impingement bilaterally at C3-4 to C6-7. 3. Advanced facet osteoarthritis on the right at C1-2 with marrow edema and right C2 impingement. 4. Ankylosis at C2-3, C3-4, and C5-6.     Electronically Signed   By: Tiburcio Pea M.D.   On: 11/19/2022 07:53  PATIENT SURVEYS:  NDI 21/50  COGNITION: Overall cognitive status: Within functional limits for tasks assessed  SENSATION: Light touch: Impaired  R lower leg and foot   POSTURE: flexed trunk , B hips flexed to 20 degrees with forward trunk lean, sloped , rounded B shoulders  PALPATION: Stiff, tender B upper traps, B levator scapulae Minimal jt mobility cervical spine for lateral glides and for cervical distraction   CERVICAL ROM:   Active ROM A/PROM (deg) eval  Flexion 40  Extension 25  Right lateral  flexion   Left lateral flexion   Right rotation 30  Left rotation 0   (Blank rows = not tested) THORACIC SPINE ROM:   rotation 70 % B, FB 70 %, ext 20% LS ROM NT today UPPER EXTREMITY ROM:  Active ROM Right eval Left eval  Shoulder flexion 115   Shoulder extension    Shoulder abduction 110   Shoulder adduction    Shoulder extension    Shoulder internal rotation Behind back to t 11 Behind back to t 11  Shoulder external rotation    Elbow flexion    Elbow extension    Wrist flexion    Wrist extension    Wrist ulnar deviation    Wrist radial deviation    Wrist pronation    Wrist supination     (Blank rows = not tested) LOWER EXTREMITY ROM: B Hip flexion 95, IR 5 degrees, extension NT, B knees, ankles wnl UPPER EXTREMITY MMT:  MMT Right eval Left eval  Shoulder flexion    Shoulder extension    Shoulder abduction 3-   Shoulder adduction    Shoulder extension    Shoulder internal rotation    Shoulder external rotation 4   Middle trapezius    Lower trapezius    Elbow flexion 4-   Elbow extension    Wrist flexion  Wrist extension    Wrist ulnar deviation    Wrist radial deviation    Wrist pronation    Wrist supination    Grip strength     (Blank rows = wfl) LE MMT:  R ankle dorsiflexion gross strength B LE's 4/5 throughout  CERVICAL SPECIAL TESTS:  Spurling's test: Negative and Distraction test: Negative  FUNCTIONAL TESTS:  10 meter walk test: 8 sec or 0.8 m/sec  GAIT: decreased stance time on L, circumducts R LE in swing phase  TODAY'S TREATMENT:                                                                                                                              DATE: 01/29/23 NuStep L5 x80mins  Pec stretch 30s x2  Standing against wall shoulder flexion with dowel 2x5  Rows and ext green 2x10  Cervical ext and rotations with towel x10  Horizontal abd red x10  PROM to neck and STM and stretching of upper traps MH for 5 mins   01/27/23:  PT  evaluation, instructed in HEP as described below, to improve ease of achieving upright posture  PATIENT EDUCATION:  Education details: POC, goals  Person educated: Patient Education method: Explanation, Demonstration, Tactile cues, and Verbal cues Education comprehension: verbalized understanding, returned demonstration, and verbal cues required  HOME EXERCISE PROGRAM: Access Code: [email or web address] URL: [email or web address] Date: 01/27/2023 Prepared by: Amy Speaks  Exercises - Supine Lower Trunk Rotation  - 1 x daily - 7 x weekly - 3 sets - 10 reps - Standing shoulder flexion wall slides  - 1 x daily - 7 x weekly - 3 sets - 10 reps - Doorway Pec Stretch at 90 Degrees Abduction  - 1 x daily - 7 x weekly - 3 sets - 10 reps - Hooklying Single Knee to Chest Stretch  - 1 x daily - 7 x weekly - 3 sets - 10 reps  ASSESSMENT:  CLINICAL IMPRESSION: Patient reports the exercises have helped. The focus today was to work on postural strengthening. Most difficulty today with horizontal abduction using the band, reports his R arm is weak. He is very tight in his neck and upper traps with STM. Will benefit from ongoing strengthening and stretching to address his neck pain and postural deficits.   OBJECTIVE IMPAIRMENTS: Abnormal gait, decreased balance, difficulty walking, decreased ROM, decreased strength, impaired flexibility, impaired sensation, postural dysfunction, and pain.   ACTIVITY LIMITATIONS: carrying, lifting, squatting, stairs, transfers, and locomotion level  PARTICIPATION LIMITATIONS: community activity, occupation, and yard work  PERSONAL FACTORS: Age, Behavior pattern, Fitness, Past/current experiences, Time since onset of injury/illness/exacerbation, and 3+ comorbidities: Dm, restrictive lung disease, cervical spinal stenosis, lumbar stenosis and radiculopathy  are also affecting patient's functional outcome.   REHAB POTENTIAL: Good  CLINICAL DECISION MAKING:  Evolving/moderate complexity  EVALUATION COMPLEXITY: Moderate   GOALS: Goals reviewed with patient? Yes  SHORT TERM GOALS: Target date: 2 weeks 02/10/23  I HEP Baseline: initiated  today Goal status: INITIAL   LONG TERM GOALS: Target date: 04/21/23  Improve gait speed to greater than 1 m/sec for improved community ambulation Baseline: 0.8 m/sec Goal status: INITIAL  2.  NDI improve from 21/50 to 11/50 Baseline:  Goal status: INITIAL  3.  Improve B LE strength to 5/5 with MMT except dorsiflexion R ankle Baseline: 4/5 gross Goal status: INITIAL    PLAN:  PT FREQUENCY: 1-2x/week  PT DURATION: 8 weeks  PLANNED INTERVENTIONS: Therapeutic exercises, Therapeutic activity, Neuromuscular re-education, Balance training, Gait training, Patient/Family education, Self Care, and Joint mobilization  PLAN FOR NEXT SESSION: periscapular strengthening, dry needling, manual techniques as indicated   Cassie Freer, DPT 01/29/2023, 9:05 AM

## 2023-01-29 ENCOUNTER — Ambulatory Visit: Payer: Medicare Other

## 2023-01-29 DIAGNOSIS — M436 Torticollis: Secondary | ICD-10-CM | POA: Diagnosis not present

## 2023-01-29 DIAGNOSIS — R29898 Other symptoms and signs involving the musculoskeletal system: Secondary | ICD-10-CM

## 2023-01-29 DIAGNOSIS — R293 Abnormal posture: Secondary | ICD-10-CM

## 2023-02-01 ENCOUNTER — Encounter: Payer: Self-pay | Admitting: Physical Therapy

## 2023-02-01 ENCOUNTER — Ambulatory Visit: Payer: Medicare Other | Admitting: Physical Therapy

## 2023-02-01 DIAGNOSIS — R29898 Other symptoms and signs involving the musculoskeletal system: Secondary | ICD-10-CM

## 2023-02-01 DIAGNOSIS — R293 Abnormal posture: Secondary | ICD-10-CM | POA: Diagnosis not present

## 2023-02-01 DIAGNOSIS — M436 Torticollis: Secondary | ICD-10-CM | POA: Diagnosis not present

## 2023-02-01 NOTE — Therapy (Signed)
OUTPATIENT PHYSICAL THERAPY CERVICAL TREATMENT   Patient Name: Andres Taylor MRN: [id number] DOB:April 28, 1946, 77 y.o., male Today's Date: 02/01/2023  END OF SESSION:  PT End of Session - 02/01/23 1548     Visit Number 3    Date for PT Re-Evaluation 04/21/23    PT Start Time 1546    PT Stop Time 1625    PT Time Calculation (min) 39 min    Activity Tolerance Patient tolerated treatment well    Behavior During Therapy [facility] for tasks assessed/performed               Past Medical History:  Diagnosis Date   Abdominal bloating 03/13/2019   Allergic rhinitis    Allergy    Arthritis    Asthma    Blood in urine    Cataract    forming    Chronic cough    CKD (chronic kidney disease), stage II    DDD (degenerative disc disease)    Diabetes mellitus (HCC)    DOE (dyspnea on exertion)    Eustachian tube dysfunction    Fatty liver    GERD (gastroesophageal reflux disease)    Glaucoma    Gout    Hard of hearing    bilateral    Heme positive stool 03/13/2019   History of cardiomegaly 05/16/2019   Noted on CXR   History of kidney stones    Hyperlipidemia    Hypertension    Inguinal hernia    Bilateral   Joint pain, knee    Leg edema    Lung tumor (benign), right    Right upper lobe lung mass   Obesity    OSA on CPAP    Pneumonitis    right lung   Pulmonary HTN (HCC) 04/18/2013   Restrictive lung disease    Sciatica    Spinal stenosis    Wears contact lenses    Wears glasses    Wears hearing aid in both ears    Past Surgical History:  Procedure Laterality Date   APPENDECTOMY  1976   cataract surgery Bilateral    march and april 2023   COLONOSCOPY  08/2010   COLONOSCOPY  08/07/2019   HEMORRHOID SURGERY  2001   INGUINAL HERNIA REPAIR Left 1948   as a baby 45 months old   INGUINAL HERNIA REPAIR Bilateral 09/21/2019   Procedure: LAPAROSCOPIC BILATERAL INGUINAL HERNIA REPAIR, LYSIS OF ADHESIONS;  Surgeon: Karie Soda, MD;  Location: [facility];  Service: General;  Laterality: Bilateral;   LEFT AND RIGHT HEART CATHETERIZATION WITH CORONARY ANGIOGRAM N/A 04/20/2013   Procedure: LEFT AND RIGHT HEART CATHETERIZATION WITH CORONARY ANGIOGRAM;  Surgeon: Marykay Lex, MD;  Location: [facility] CATH LAB;  Angiographically normal Coronary Arteries - L Dom Cx (OM1, small LPL1 then LPL2 & PDA) & wraparound LAD (D1-3 small).  Borderline CO/CI - 4.94/2.16 by both Fick & TD.  RAP ~14 mmHg, RVEDP 15 mmHg.  LVEDP 19 mmHg (->diastolic dysfunction) PCWP 16 mmHg, but PA Mean 25 mmHg   LITHOTRIPSY     x 2   LOBECTOMY Right    lumbar disectomy L4-5  1980   NM MYOVIEW LTD  06/15/2019    EF 70%.  No ischemia or infarction.  LOW RISK    TRANSTHORACIC ECHOCARDIOGRAM  04/21/2019   EF 60 to 65%.  Mild LVH.  Normal RV.  Normal atrial size.  Mild aortic sclerosis.  No stenosis. Lipomatous intra-atrial septum   UPPER GI ENDOSCOPY  08/07/2019  VIDEO ASSISTED THORACOSCOPY (VATS)/WEDGE RESECTION Right 08/30/2013   Procedure: VIDEO ASSISTED THORACOSCOPY (VATS)/WEDGE RESECTION;  Surgeon: Loreli Slot, MD;  Location: [facility];  Service: Thoracic;  Laterality: Right;   Patient Active Problem List   Diagnosis Date Noted   Chronic right-sided low back pain with right-sided sciatica 11/20/2022   Epigastric pain 04/27/2022   Aortic atherosclerosis (HCC) 10/07/2021   Allergic rhinitis 10/02/2021   Difficult airway for intubation 09/21/2019   Bilateral inguinal hernia (BIH) 09/21/2019   Asthma 05/25/2019   Generalized abdominal pain 03/13/2019   Chronic cough 09/08/2018   Spinal stenosis in cervical region 06/07/2018   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   CKD (chronic kidney disease) stage 2, GFR 60-89 ml/min 05/17/2018   OSA on CPAP 05/17/2018   Restrictive lung disease 05/17/2018   Cervical radiculopathy 05/17/2018   Diabetic neuropathy associated with type 2 diabetes mellitus (HCC) 05/17/2018   Hyperuricemia 05/17/2018    Dermatochalasis of both upper eyelids 02/25/2017   Acute angle-closure glaucoma of both eyes 12/04/2016   DM (diabetes mellitus) (HCC) 12/01/2016   Hyperopia with astigmatism and presbyopia, bilateral 12/01/2016   Nuclear sclerotic cataract of left eye 12/01/2016   Posterior subcapsular age-related cataract of both eyes 12/01/2016   Vitreous floater, bilateral 12/01/2016   Scarring of lung 08/30/2013   Microvascular angina 04/18/2013   Pre-operative cardiovascular examination 04/18/2013   Dyspnea on exertion 04/17/2013   Leg edema    Hyperlipidemia associated with type 2 diabetes mellitus (HCC)    HTN (hypertension), benign     PCP: Conni Elliot, NP  REFERRING PROVIDER: Estill Bamberg, MD  REFERRING DIAG: cervical spine stenosis, lumbar spinal stenosis and radiculopathy  THERAPY DIAG:  Abnormal posture  Stiffness of cervical spine  Weakness of both legs  Rationale for Evaluation and Treatment: Rehabilitation  ONSET DATE: chronic  SUBJECTIVE:                                                                                                                                                                                                         SUBJECTIVE STATEMENT: Patient reports continued neck pain.   Hand dominance: Right  PERTINENT HISTORY:  Chronic R LE radiculopathy, L5/S1 transforaminal steroid injection on R  on 12/10/22, with good relief of R LE radiculopathy.  cervical spine injections 2 weeks ago , didn't help as much as the lumbar injection. Referred to PT by orthopedist to address both neck and back pain and stiffness  PAIN:  Are you having pain? Yes: NPRS scale: 4/10 Pain location: stiff ,  sore muscles in neck and B upper traps Pain description: stiffness primarily  Aggravating factors: no specific aggravating positions or movements Relieving factors: Tylenol  PRECAUTIONS: None  RED FLAGS: None     WEIGHT BEARING RESTRICTIONS: No  FALLS:  Has  patient fallen in last 6 months? No  LIVING ENVIRONMENT: Lives with: lives with their spouse Lives in: House/apartment Stairs: Yes: Internal: 11 steps; on left going up Has following equipment at home: Dan Humphreys - 2 wheeled and [employer] - 4 wheeled  OCCUPATION: retired   PLOF: Independent and Needs assistance with homemaking  PATIENT GOALS: improve stiffness, soreness neck and back  NEXT MD VISIT: 8 weeks  OBJECTIVE:   DIAGNOSTIC FINDINGS:  1. Severe multilevel degenerative changes with osseous fusion across nearly every lumbar vertebral body level. There is severe spinal canal stenosis at L1-L4 secondary to a combination congenitally small pedicles degenerative disc disease, and ligamentum flavum hypertrophy 2. Multilevel neural foraminal narrowing, severe bilaterally at L5-S1.     Electronically Signed   By: Lorenza Cambridge M.D.   On: 11/25/2022 14:57  IMPRESSION: 1. 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 2. Advanced foraminal impingement bilaterally at C3-4 to C6-7. 3. Advanced facet osteoarthritis on the right at C1-2 with marrow edema and right C2 impingement. 4. Ankylosis at C2-3, C3-4, and C5-6.     Electronically Signed   By: Tiburcio Pea M.D.   On: 11/19/2022 07:53  PATIENT SURVEYS:  NDI 21/50  COGNITION: Overall cognitive status: Within functional limits for tasks assessed  SENSATION: Light touch: Impaired  R lower leg and foot   POSTURE: flexed trunk , B hips flexed to 20 degrees with forward trunk lean, sloped , rounded B shoulders  PALPATION: Stiff, tender B upper traps, B levator scapulae Minimal jt mobility cervical spine for lateral glides and for cervical distraction   CERVICAL ROM:   Active ROM A/PROM (deg) eval  Flexion 40  Extension 25  Right lateral flexion   Left lateral flexion   Right rotation 30  Left rotation 0    (Blank rows = not tested) THORACIC SPINE ROM:   rotation 70 % B, FB 70 %, ext 20% LS ROM NT today UPPER EXTREMITY ROM:  Active ROM Right eval Left eval  Shoulder flexion 115   Shoulder extension    Shoulder abduction 110   Shoulder adduction    Shoulder extension    Shoulder internal rotation Behind back to t 11 Behind back to t 11  Shoulder external rotation    Elbow flexion    Elbow extension    Wrist flexion    Wrist extension    Wrist ulnar deviation    Wrist radial deviation    Wrist pronation    Wrist supination     (Blank rows = not tested) LOWER EXTREMITY ROM: B Hip flexion 95, IR 5 degrees, extension NT, B knees, ankles wnl UPPER EXTREMITY MMT:  MMT Right eval Left eval  Shoulder flexion    Shoulder extension    Shoulder abduction 3-   Shoulder adduction    Shoulder extension    Shoulder internal rotation    Shoulder external rotation 4   Middle trapezius    Lower trapezius    Elbow flexion 4-   Elbow extension    Wrist flexion    Wrist extension    Wrist ulnar deviation    Wrist radial deviation  Wrist pronation    Wrist supination    Grip strength     (Blank rows = wfl) LE MMT:  R ankle dorsiflexion gross strength B LE's 4/5 throughout  CERVICAL SPECIAL TESTS:  Spurling's test: Negative and Distraction test: Negative  FUNCTIONAL TESTS:  10 meter walk test: 8 sec or 0.8 m/sec  GAIT: decreased stance time on L, circumducts R LE in swing phase  TODAY'S TREATMENT:                                                                                                                              DATE: 02/01/23 NuStep L5 x 6 minutes STM and stretch to posterior cervical muscles, head pull Chin tucks on towel roll x 10 Seated lower cerv/ up thoracic stretch Seated lats, pushing into mat behind hips Standing shoulder ext, rows, ER against G tband MH to neck for acute pain relief.  01/29/23 NuStep L5 x61mins  Pec stretch 30s x2  Standing against  wall shoulder flexion with dowel 2x5  Rows and ext green 2x10  Cervical ext and rotations with towel x10  Horizontal abd red x10  PROM to neck and STM and stretching of upper traps MH for 5 mins   01/27/23:  PT evaluation, instructed in HEP as described below, to improve ease of achieving upright posture  PATIENT EDUCATION:  Education details: POC, goals  Person educated: Patient Education method: Explanation, Demonstration, Tactile cues, and Verbal cues Education comprehension: verbalized understanding, returned demonstration, and verbal cues required  HOME EXERCISE PROGRAM: Access Code: [email or web address] URL: [email or web address] Date: 01/27/2023 Prepared by: Amy Speaks  Exercises - Supine Lower Trunk Rotation  - 1 x daily - 7 x weekly - 3 sets - 10 reps - Standing shoulder flexion wall slides  - 1 x daily - 7 x weekly - 3 sets - 10 reps - Doorway Pec Stretch at 90 Degrees Abduction  - 1 x daily - 7 x weekly - 3 sets - 10 reps - Hooklying Single Knee to Chest Stretch  - 1 x daily - 7 x weekly - 3 sets - 10 reps  ASSESSMENT:  CLINICAL IMPRESSION: Patient reports his neck pain is still present. Provided some STM and stretch F/B self stretch and scapular stabilization exercises for improved postural control. Patient reported improved neck pain after performing the exercises, HEP updated as well.   OBJECTIVE IMPAIRMENTS: Abnormal gait, decreased balance, difficulty walking, decreased ROM, decreased strength, impaired flexibility, impaired sensation, postural dysfunction, and pain.   ACTIVITY LIMITATIONS: carrying, lifting, squatting, stairs, transfers, and locomotion level  PARTICIPATION LIMITATIONS: community activity, occupation, and yard work  PERSONAL FACTORS: Age, Behavior pattern, Fitness, Past/current experiences, Time since onset of injury/illness/exacerbation, and 3+ comorbidities: Dm, restrictive lung disease, cervical spinal stenosis, lumbar stenosis and  radiculopathy  are also affecting patient's functional outcome.   REHAB POTENTIAL: Good  CLINICAL DECISION MAKING: Evolving/moderate complexity  EVALUATION COMPLEXITY: Moderate   GOALS: Goals reviewed with patient? Yes  SHORT TERM GOALS: Target date: 2 weeks 02/10/23  I HEP Baseline: initiated today Goal status: INITIAL   LONG TERM GOALS: Target date: 04/21/23  Improve gait speed to greater than 1 m/sec for improved community ambulation Baseline: 0.8 m/sec Goal status: INITIAL  2.  NDI improve from 21/50 to 11/50 Baseline:  Goal status: INITIAL  3.  Improve B LE strength to 5/5 with MMT except dorsiflexion R ankle Baseline: 4/5 gross Goal status: INITIAL    PLAN:  PT FREQUENCY: 1-2x/week  PT DURATION: 8 weeks  PLANNED INTERVENTIONS: Therapeutic exercises, Therapeutic activity, Neuromuscular re-education, Balance training, Gait training, Patient/Family education, Self Care, and Joint mobilization  PLAN FOR NEXT SESSION: periscapular strengthening, dry needling, manual techniques as indicated   Oley Balm DPT 02/01/23 4:23 PM  02/01/2023, 4:23 PM

## 2023-02-16 NOTE — Therapy (Incomplete)
OUTPATIENT PHYSICAL THERAPY CERVICAL TREATMENT   Patient Name: Andres Taylor MRN: [id number] DOB:10-26-1945, 77 y.o., male Today's Date: 02/16/2023  END OF SESSION:      Past Medical History:  Diagnosis Date   Abdominal bloating 03/13/2019   Allergic rhinitis    Allergy    Arthritis    Asthma    Blood in urine    Cataract    forming    Chronic cough    CKD (chronic kidney disease), stage II    DDD (degenerative disc disease)    Diabetes mellitus (HCC)    DOE (dyspnea on exertion)    Eustachian tube dysfunction    Fatty liver    GERD (gastroesophageal reflux disease)    Glaucoma    Gout    Hard of hearing    bilateral    Heme positive stool 03/13/2019   History of cardiomegaly 05/16/2019   Noted on CXR   History of kidney stones    Hyperlipidemia    Hypertension    Inguinal hernia    Bilateral   Joint pain, knee    Leg edema    Lung tumor (benign), right    Right upper lobe lung mass   Obesity    OSA on CPAP    Pneumonitis    right lung   Pulmonary HTN (HCC) 04/18/2013   Restrictive lung disease    Sciatica    Spinal stenosis    Wears contact lenses    Wears glasses    Wears hearing aid in both ears    Past Surgical History:  Procedure Laterality Date   APPENDECTOMY  1976   cataract surgery Bilateral    march and april 2023   COLONOSCOPY  08/2010   COLONOSCOPY  08/07/2019   HEMORRHOID SURGERY  2001   INGUINAL HERNIA REPAIR Left 1948   as a baby 10 months old   INGUINAL HERNIA REPAIR Bilateral 09/21/2019   Procedure: LAPAROSCOPIC BILATERAL INGUINAL HERNIA REPAIR, LYSIS OF ADHESIONS;  Surgeon: Karie Soda, MD;  Location: [facility];  Service: General;  Laterality: Bilateral;   LEFT AND RIGHT HEART CATHETERIZATION WITH CORONARY ANGIOGRAM N/A 04/20/2013   Procedure: LEFT AND RIGHT HEART CATHETERIZATION WITH CORONARY ANGIOGRAM;  Surgeon: Marykay Lex, MD;  Location: [facility] CATH LAB;  Angiographically normal Coronary Arteries - L  Dom Cx (OM1, small LPL1 then LPL2 & PDA) & wraparound LAD (D1-3 small).  Borderline CO/CI - 4.94/2.16 by both Fick & TD.  RAP ~14 mmHg, RVEDP 15 mmHg.  LVEDP 19 mmHg (->diastolic dysfunction) PCWP 16 mmHg, but PA Mean 25 mmHg   LITHOTRIPSY     x 2   LOBECTOMY Right    lumbar disectomy L4-5  1980   NM MYOVIEW LTD  06/15/2019    EF 70%.  No ischemia or infarction.  LOW RISK    TRANSTHORACIC ECHOCARDIOGRAM  04/21/2019   EF 60 to 65%.  Mild LVH.  Normal RV.  Normal atrial size.  Mild aortic sclerosis.  No stenosis. Lipomatous intra-atrial septum   UPPER GI ENDOSCOPY  08/07/2019   VIDEO ASSISTED THORACOSCOPY (VATS)/WEDGE RESECTION Right 08/30/2013   Procedure: VIDEO ASSISTED THORACOSCOPY (VATS)/WEDGE RESECTION;  Surgeon: Loreli Slot, MD;  Location: [facility] OR;  Service: Thoracic;  Laterality: Right;   Patient Active Problem List   Diagnosis Date Noted   Chronic right-sided low back pain with right-sided sciatica 11/20/2022   Epigastric pain 04/27/2022   Aortic atherosclerosis (HCC) 10/07/2021   Allergic rhinitis 10/02/2021   Difficult  airway for intubation 09/21/2019   Bilateral inguinal hernia (BIH) 09/21/2019   Asthma 05/25/2019   Generalized abdominal pain 03/13/2019   Chronic cough 09/08/2018   Spinal stenosis in cervical region 06/07/2018   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   CKD (chronic kidney disease) stage 2, GFR 60-89 ml/min 05/17/2018   OSA on CPAP 05/17/2018   Restrictive lung disease 05/17/2018   Cervical radiculopathy 05/17/2018   Diabetic neuropathy associated with type 2 diabetes mellitus (HCC) 05/17/2018   Hyperuricemia 05/17/2018   Dermatochalasis of both upper eyelids 02/25/2017   Acute angle-closure glaucoma of both eyes 12/04/2016   DM (diabetes mellitus) (HCC) 12/01/2016   Hyperopia with astigmatism and presbyopia, bilateral 12/01/2016   Nuclear sclerotic cataract of left eye 12/01/2016   Posterior subcapsular age-related  cataract of both eyes 12/01/2016   Vitreous floater, bilateral 12/01/2016   Scarring of lung 08/30/2013   Microvascular angina 04/18/2013   Pre-operative cardiovascular examination 04/18/2013   Dyspnea on exertion 04/17/2013   Leg edema    Hyperlipidemia associated with type 2 diabetes mellitus (HCC)    HTN (hypertension), benign     PCP: Conni Elliot, NP  REFERRING PROVIDER: Estill Bamberg, MD  REFERRING DIAG: cervical spine stenosis, lumbar spinal stenosis and radiculopathy  THERAPY DIAG:  No diagnosis found.  Rationale for Evaluation and Treatment: Rehabilitation  ONSET DATE: chronic  SUBJECTIVE:                                                                                                                                                                                                         SUBJECTIVE STATEMENT: Patient reports continued neck pain.   Hand dominance: Right  PERTINENT HISTORY:  Chronic R LE radiculopathy, L5/S1 transforaminal steroid injection on R  on 12/10/22, with good relief of R LE radiculopathy.  cervical spine injections 2 weeks ago , didn't help as much as the lumbar injection. Referred to PT by orthopedist to address both neck and back pain and stiffness  PAIN:  Are you having pain? Yes: NPRS scale: 4/10 Pain location: stiff , sore muscles in neck and B upper traps Pain description: stiffness primarily  Aggravating factors: no specific aggravating positions or movements Relieving factors: Tylenol  PRECAUTIONS: None  RED FLAGS: None     WEIGHT BEARING RESTRICTIONS: No  FALLS:  Has patient fallen in last 6 months? No  LIVING ENVIRONMENT: Lives with: lives with their spouse Lives in: House/apartment Stairs: Yes: Internal: 11 steps; on left going up Has following equipment at home: Dan Humphreys - 2 wheeled  and Walker - 4 wheeled  OCCUPATION: retired   PLOF: Independent and Needs assistance with homemaking  PATIENT GOALS: improve  stiffness, soreness neck and back  NEXT MD VISIT: 8 weeks  OBJECTIVE:   DIAGNOSTIC FINDINGS:  1. Severe multilevel degenerative changes with osseous fusion across nearly every lumbar vertebral body level. There is severe spinal canal stenosis at L1-L4 secondary to a combination congenitally small pedicles degenerative disc disease, and ligamentum flavum hypertrophy 2. Multilevel neural foraminal narrowing, severe bilaterally at L5-S1.     Electronically Signed   By: Lorenza Cambridge M.D.   On: 11/25/2022 14:57  IMPRESSION: 1. 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 2. Advanced foraminal impingement bilaterally at C3-4 to C6-7. 3. Advanced facet osteoarthritis on the right at C1-2 with marrow edema and right C2 impingement. 4. Ankylosis at C2-3, C3-4, and C5-6.     Electronically Signed   By: Tiburcio Pea M.D.   On: 11/19/2022 07:53  PATIENT SURVEYS:  NDI 21/50  COGNITION: Overall cognitive status: Within functional limits for tasks assessed  SENSATION: Light touch: Impaired  R lower leg and foot   POSTURE: flexed trunk , B hips flexed to 20 degrees with forward trunk lean, sloped , rounded B shoulders  PALPATION: Stiff, tender B upper traps, B levator scapulae Minimal jt mobility cervical spine for lateral glides and for cervical distraction   CERVICAL ROM:   Active ROM A/PROM (deg) eval  Flexion 40  Extension 25  Right lateral flexion   Left lateral flexion   Right rotation 30  Left rotation 0   (Blank rows = not tested) THORACIC SPINE ROM:   rotation 70 % B, FB 70 %, ext 20% LS ROM NT today UPPER EXTREMITY ROM:  Active ROM Right eval Left eval  Shoulder flexion 115   Shoulder extension    Shoulder abduction 110   Shoulder adduction    Shoulder extension    Shoulder internal rotation Behind back to t 11 Behind back to t 11   Shoulder external rotation    Elbow flexion    Elbow extension    Wrist flexion    Wrist extension    Wrist ulnar deviation    Wrist radial deviation    Wrist pronation    Wrist supination     (Blank rows = not tested) LOWER EXTREMITY ROM: B Hip flexion 95, IR 5 degrees, extension NT, B knees, ankles wnl UPPER EXTREMITY MMT:  MMT Right eval Left eval  Shoulder flexion    Shoulder extension    Shoulder abduction 3-   Shoulder adduction    Shoulder extension    Shoulder internal rotation    Shoulder external rotation 4   Middle trapezius    Lower trapezius    Elbow flexion 4-   Elbow extension    Wrist flexion    Wrist extension    Wrist ulnar deviation    Wrist radial deviation    Wrist pronation    Wrist supination    Grip strength     (Blank rows = wfl) LE MMT:  R ankle dorsiflexion gross strength B LE's 4/5 throughout  CERVICAL SPECIAL TESTS:  Spurling's test: Negative and Distraction test: Negative  FUNCTIONAL TESTS:  10 meter walk test: 8 sec or 0.8 m/sec  GAIT: decreased stance time on L, circumducts R LE in swing phase  TODAY'S TREATMENT:  DATE: 02/17/23 NuStep Shoulder ext Rows and ext green band  Seated thoracic stretch against half foam roll STM and stretch to posterior cervical muscles, head pull [facility]   02/01/23 NuStep L5 x 6 minutes STM and stretch to posterior cervical muscles, head pull Chin tucks on towel roll x 10 Seated lower cerv/ up thoracic stretch Seated lats, pushing into mat behind hips Standing shoulder ext, rows, ER against G tband MH to neck for acute pain relief.  01/29/23 NuStep L5 x73mins  Pec stretch 30s x2  Standing against wall shoulder flexion with dowel 2x5  Rows and ext green 2x10  Cervical ext and rotations with towel x10  Horizontal abd red x10  PROM to neck and STM and stretching of upper  traps MH for 5 mins   01/27/23:  PT evaluation, instructed in HEP as described below, to improve ease of achieving upright posture  PATIENT EDUCATION:  Education details: POC, goals  Person educated: Patient Education method: Explanation, Demonstration, Tactile cues, and Verbal cues Education comprehension: verbalized understanding, returned demonstration, and verbal cues required  HOME EXERCISE PROGRAM: Access Code: [email or web address] URL: [email or web address] Date: 01/27/2023 Prepared by: Amy Speaks  Exercises - Supine Lower Trunk Rotation  - 1 x daily - 7 x weekly - 3 sets - 10 reps - Standing shoulder flexion wall slides  - 1 x daily - 7 x weekly - 3 sets - 10 reps - Doorway Pec Stretch at 90 Degrees Abduction  - 1 x daily - 7 x weekly - 3 sets - 10 reps - Hooklying Single Knee to Chest Stretch  - 1 x daily - 7 x weekly - 3 sets - 10 reps  ASSESSMENT:  CLINICAL IMPRESSION: Patient reports his neck pain is still present. Provided some STM and stretch F/B self stretch and scapular stabilization exercises for improved postural control. Patient reported improved neck pain after performing the exercises, HEP updated as well.   OBJECTIVE IMPAIRMENTS: Abnormal gait, decreased balance, difficulty walking, decreased ROM, decreased strength, impaired flexibility, impaired sensation, postural dysfunction, and pain.   ACTIVITY LIMITATIONS: carrying, lifting, squatting, stairs, transfers, and locomotion level  PARTICIPATION LIMITATIONS: community activity, occupation, and yard work  PERSONAL FACTORS: Age, Behavior pattern, Fitness, Past/current experiences, Time since onset of injury/illness/exacerbation, and 3+ comorbidities: Dm, restrictive lung disease, cervical spinal stenosis, lumbar stenosis and radiculopathy  are also affecting patient's functional outcome.   REHAB POTENTIAL: Good  CLINICAL DECISION MAKING: Evolving/moderate complexity  EVALUATION COMPLEXITY:  Moderate   GOALS: Goals reviewed with patient? Yes  SHORT TERM GOALS: Target date: 2 weeks 02/10/23  I HEP Baseline: initiated today Goal status: INITIAL   LONG TERM GOALS: Target date: 04/21/23  Improve gait speed to greater than 1 m/sec for improved community ambulation Baseline: 0.8 m/sec Goal status: INITIAL  2.  NDI improve from 21/50 to 11/50 Baseline:  Goal status: INITIAL  3.  Improve B LE strength to 5/5 with MMT except dorsiflexion R ankle Baseline: 4/5 gross Goal status: INITIAL    PLAN:  PT FREQUENCY: 1-2x/week  PT DURATION: 8 weeks  PLANNED INTERVENTIONS: Therapeutic exercises, Therapeutic activity, Neuromuscular re-education, Balance training, Gait training, Patient/Family education, Self Care, and Joint mobilization  PLAN FOR NEXT SESSION: periscapular strengthening, dry needling, manual techniques as indicated   Oley Balm DPT 02/16/23 10:24 AM  02/16/2023, 10:24 AM

## 2023-02-17 ENCOUNTER — Ambulatory Visit: Payer: Medicare Other | Attending: Orthopedic Surgery

## 2023-02-17 ENCOUNTER — Other Ambulatory Visit: Payer: Self-pay

## 2023-02-17 DIAGNOSIS — M436 Torticollis: Secondary | ICD-10-CM | POA: Diagnosis not present

## 2023-02-17 DIAGNOSIS — R29898 Other symptoms and signs involving the musculoskeletal system: Secondary | ICD-10-CM | POA: Insufficient documentation

## 2023-02-17 DIAGNOSIS — R293 Abnormal posture: Secondary | ICD-10-CM | POA: Diagnosis not present

## 2023-02-17 NOTE — Therapy (Signed)
OUTPATIENT PHYSICAL THERAPY CERVICAL TREATMENT   Patient Name: Andres Taylor MRN: [id number] DOB:1945-09-10, 77 y.o., male Today's Date: 02/17/2023  END OF SESSION:  PT End of Session - 02/17/23 0939     Visit Number 4    Date for PT Re-Evaluation 04/21/23    Progress Note Due on Visit 10    PT Start Time 0933    PT Stop Time 1015    PT Time Calculation (min) 42 min    Activity Tolerance Patient tolerated treatment well    Behavior During Therapy [facility] for tasks assessed/performed                Past Medical History:  Diagnosis Date   Abdominal bloating 03/13/2019   Allergic rhinitis    Allergy    Arthritis    Asthma    Blood in urine    Cataract    forming    Chronic cough    CKD (chronic kidney disease), stage II    DDD (degenerative disc disease)    Diabetes mellitus (HCC)    DOE (dyspnea on exertion)    Eustachian tube dysfunction    Fatty liver    GERD (gastroesophageal reflux disease)    Glaucoma    Gout    Hard of hearing    bilateral    Heme positive stool 03/13/2019   History of cardiomegaly 05/16/2019   Noted on CXR   History of kidney stones    Hyperlipidemia    Hypertension    Inguinal hernia    Bilateral   Joint pain, knee    Leg edema    Lung tumor (benign), right    Right upper lobe lung mass   Obesity    OSA on CPAP    Pneumonitis    right lung   Pulmonary HTN (HCC) 04/18/2013   Restrictive lung disease    Sciatica    Spinal stenosis    Wears contact lenses    Wears glasses    Wears hearing aid in both ears    Past Surgical History:  Procedure Laterality Date   APPENDECTOMY  1976   cataract surgery Bilateral    march and april 2023   COLONOSCOPY  08/2010   COLONOSCOPY  08/07/2019   HEMORRHOID SURGERY  2001   INGUINAL HERNIA REPAIR Left 1948   as a baby 46 months old   INGUINAL HERNIA REPAIR Bilateral 09/21/2019   Procedure: LAPAROSCOPIC BILATERAL INGUINAL HERNIA REPAIR, LYSIS OF ADHESIONS;  Surgeon: Karie Soda,  MD;  Location: [facility];  Service: General;  Laterality: Bilateral;   LEFT AND RIGHT HEART CATHETERIZATION WITH CORONARY ANGIOGRAM N/A 04/20/2013   Procedure: LEFT AND RIGHT HEART CATHETERIZATION WITH CORONARY ANGIOGRAM;  Surgeon: Marykay Lex, MD;  Location: [facility] CATH LAB;  Angiographically normal Coronary Arteries - L Dom Cx (OM1, small LPL1 then LPL2 & PDA) & wraparound LAD (D1-3 small).  Borderline CO/CI - 4.94/2.16 by both Fick & TD.  RAP ~14 mmHg, RVEDP 15 mmHg.  LVEDP 19 mmHg (->diastolic dysfunction) PCWP 16 mmHg, but PA Mean 25 mmHg   LITHOTRIPSY     x 2   LOBECTOMY Right    lumbar disectomy L4-5  1980   NM MYOVIEW LTD  06/15/2019    EF 70%.  No ischemia or infarction.  LOW RISK    TRANSTHORACIC ECHOCARDIOGRAM  04/21/2019   EF 60 to 65%.  Mild LVH.  Normal RV.  Normal atrial size.  Mild aortic sclerosis.  No stenosis.  Lipomatous intra-atrial septum   UPPER GI ENDOSCOPY  08/07/2019   VIDEO ASSISTED THORACOSCOPY (VATS)/WEDGE RESECTION Right 08/30/2013   Procedure: VIDEO ASSISTED THORACOSCOPY (VATS)/WEDGE RESECTION;  Surgeon: Loreli Slot, MD;  Location: [facility] OR;  Service: Thoracic;  Laterality: Right;   Patient Active Problem List   Diagnosis Date Noted   Chronic right-sided low back pain with right-sided sciatica 11/20/2022   Epigastric pain 04/27/2022   Aortic atherosclerosis (HCC) 10/07/2021   Allergic rhinitis 10/02/2021   Difficult airway for intubation 09/21/2019   Bilateral inguinal hernia (BIH) 09/21/2019   Asthma 05/25/2019   Generalized abdominal pain 03/13/2019   Chronic cough 09/08/2018   Spinal stenosis in cervical region 06/07/2018   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   CKD (chronic kidney disease) stage 2, GFR 60-89 ml/min 05/17/2018   OSA on CPAP 05/17/2018   Restrictive lung disease 05/17/2018   Cervical radiculopathy 05/17/2018   Diabetic neuropathy associated with type 2 diabetes mellitus (HCC)  05/17/2018   Hyperuricemia 05/17/2018   Dermatochalasis of both upper eyelids 02/25/2017   Acute angle-closure glaucoma of both eyes 12/04/2016   DM (diabetes mellitus) (HCC) 12/01/2016   Hyperopia with astigmatism and presbyopia, bilateral 12/01/2016   Nuclear sclerotic cataract of left eye 12/01/2016   Posterior subcapsular age-related cataract of both eyes 12/01/2016   Vitreous floater, bilateral 12/01/2016   Scarring of lung 08/30/2013   Microvascular angina 04/18/2013   Pre-operative cardiovascular examination 04/18/2013   Dyspnea on exertion 04/17/2013   Leg edema    Hyperlipidemia associated with type 2 diabetes mellitus (HCC)    HTN (hypertension), benign     PCP: Conni Elliot, NP  REFERRING PROVIDER: Estill Bamberg, MD  REFERRING DIAG: cervical spine stenosis, lumbar spinal stenosis and radiculopathy  THERAPY DIAG:  Abnormal posture  Stiffness of cervical spine  Weakness of both legs  Rationale for Evaluation and Treatment: Rehabilitation  ONSET DATE: chronic  SUBJECTIVE:                                                                                                                                                                                                         SUBJECTIVE STATEMENT: Patient reports continued neck pain, drove 12 hrs back from [place] a few days ago.  Not much good on the day after.  Liked the SNAGs but unable to do with  his thick towels, needs more direction  Hand dominance: Right  PERTINENT HISTORY:  Chronic R LE radiculopathy, L5/S1 transforaminal steroid injection on R  on 12/10/22, with good relief of R LE  radiculopathy.  cervical spine injections 2 weeks ago , didn't help as much as the lumbar injection. Referred to PT by orthopedist to address both neck and back pain and stiffness  PAIN:  Are you having pain? Yes: NPRS scale: 4/10 Pain location: stiff , sore muscles in neck and B upper traps Pain description: stiffness  primarily  Aggravating factors: no specific aggravating positions or movements Relieving factors: Tylenol  PRECAUTIONS: None  RED FLAGS: None     WEIGHT BEARING RESTRICTIONS: No  FALLS:  Has patient fallen in last 6 months? No  LIVING ENVIRONMENT: Lives with: lives with their spouse Lives in: House/apartment Stairs: Yes: Internal: 11 steps; on left going up Has following equipment at home: Dan Humphreys - 2 wheeled and [employer] - 4 wheeled  OCCUPATION: retired   PLOF: Independent and Needs assistance with homemaking  PATIENT GOALS: improve stiffness, soreness neck and back  NEXT MD VISIT: 8 weeks  OBJECTIVE:   DIAGNOSTIC FINDINGS:  1. Severe multilevel degenerative changes with osseous fusion across nearly every lumbar vertebral body level. There is severe spinal canal stenosis at L1-L4 secondary to a combination congenitally small pedicles degenerative disc disease, and ligamentum flavum hypertrophy 2. Multilevel neural foraminal narrowing, severe bilaterally at L5-S1.     Electronically Signed   By: Lorenza Cambridge M.D.   On: 11/25/2022 14:57  IMPRESSION: 1. 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 2. Advanced foraminal impingement bilaterally at C3-4 to C6-7. 3. Advanced facet osteoarthritis on the right at C1-2 with marrow edema and right C2 impingement. 4. Ankylosis at C2-3, C3-4, and C5-6.     Electronically Signed   By: Tiburcio Pea M.D.   On: 11/19/2022 07:53  PATIENT SURVEYS:  NDI 21/50  COGNITION: Overall cognitive status: Within functional limits for tasks assessed  SENSATION: Light touch: Impaired  R lower leg and foot   POSTURE: flexed trunk , B hips flexed to 20 degrees with forward trunk lean, sloped , rounded B shoulders  PALPATION: Stiff, tender B upper traps, B levator scapulae Minimal jt mobility cervical spine for lateral  glides and for cervical distraction   CERVICAL ROM:   Active ROM A/PROM (deg) eval  Flexion 40  Extension 25  Right lateral flexion   Left lateral flexion   Right rotation 30  Left rotation 0   (Blank rows = not tested) THORACIC SPINE ROM:   rotation 70 % B, FB 70 %, ext 20% LS ROM NT today UPPER EXTREMITY ROM:  Active ROM Right eval Left eval  Shoulder flexion 115   Shoulder extension    Shoulder abduction 110   Shoulder adduction    Shoulder extension    Shoulder internal rotation Behind back to t 11 Behind back to t 11  Shoulder external rotation    Elbow flexion    Elbow extension    Wrist flexion    Wrist extension    Wrist ulnar deviation    Wrist radial deviation    Wrist pronation    Wrist supination     (Blank rows = not tested) LOWER EXTREMITY ROM: B Hip flexion 95, IR 5 degrees, extension NT, B knees, ankles wnl UPPER EXTREMITY MMT:  MMT Right eval Left eval  Shoulder flexion    Shoulder extension    Shoulder abduction 3-   Shoulder adduction    Shoulder extension    Shoulder internal rotation    Shoulder external  rotation 4   Middle trapezius    Lower trapezius    Elbow flexion 4-   Elbow extension    Wrist flexion    Wrist extension    Wrist ulnar deviation    Wrist radial deviation    Wrist pronation    Wrist supination    Grip strength     (Blank rows = wfl) LE MMT:  R ankle dorsiflexion gross strength B LE's 4/5 throughout  CERVICAL SPECIAL TESTS:  Spurling's test: Negative and Distraction test: Negative  FUNCTIONAL TESTS:  10 meter walk test: 8 sec or 0.8 m/sec  GAIT: decreased stance time on L, circumducts R LE in swing phase  TODAY'S TREATMENT:                                                                                                                              DATE: 02/17/23: Nustep level 5, 6 min, Ue's and LE's to improve tissue perfusion, for light mid range motion extremities and spine and improved joint  nutrition Review and practiced previously instructed ex: Seated cervical rotation SNAGs, utilized pillow case, then used webbing strap to improve rotation Seated cervical extension with webbing strap for extension motion  Manual: supine for alternating between manual cervical traction, stretching of upper traps, levator scapulae, and suboccipital musculature, 8 to 10 x each. Also isometrics to engage levator B, and for cervical rotation, cervical occipitals, in bouts of 5 sec holds, 6 to 10 reps for each.  02/01/23 NuStep L5 x 6 minutes STM and stretch to posterior cervical muscles, head pull Chin tucks on towel roll x 10 Seated lower cerv/ up thoracic stretch Seated lats, pushing into mat behind hips Standing shoulder ext, rows, ER against G tband MH to neck for acute pain relief.  01/29/23 NuStep L5 x75mins  Pec stretch 30s x2  Standing against wall shoulder flexion with dowel 2x5  Rows and ext green 2x10  Cervical ext and rotations with towel x10  Horizontal abd red x10  PROM to neck and STM and stretching of upper traps MH for 5 mins   01/27/23:  PT evaluation, instructed in HEP as described below, to improve ease of achieving upright posture  PATIENT EDUCATION:  Education details: POC, goals  Person educated: Patient Education method: Explanation, Demonstration, Tactile cues, and Verbal cues Education comprehension: verbalized understanding, returned demonstration, and verbal cues required  HOME EXERCISE PROGRAM: Access Code: [email or web address] URL: [email or web address] Date: 01/27/2023 Prepared by:    Exercises - Supine Lower Trunk Rotation  - 1 x daily - 7 x weekly - 3 sets - 10 reps - Standing shoulder flexion wall slides  - 1 x daily - 7 x weekly - 3 sets - 10 reps - Doorway Pec Stretch at 90 Degrees Abduction  - 1 x daily - 7 x weekly - 3 sets - 10 reps - Hooklying Single Knee to Chest Stretch  - 1 x daily - 7 x weekly - 3  sets - 10  reps  ASSESSMENT:  CLINICAL IMPRESSION: Patient reports his neck pain is still present. Reviewed and adapted the cervical NAGS/SNAGS to make more manageable and he had relief.  Also reviewed again supplementing the manual , hands on stretching with the postural retraining/ therx.  He wishes to continue so will keep with ongoing POC>  he has missed a couple of weeks due to driving o and from [place].    OBJECTIVE IMPAIRMENTS: Abnormal gait, decreased balance, difficulty walking, decreased ROM, decreased strength, impaired flexibility, impaired sensation, postural dysfunction, and pain.   ACTIVITY LIMITATIONS: carrying, lifting, squatting, stairs, transfers, and locomotion level  PARTICIPATION LIMITATIONS: community activity, occupation, and yard work  PERSONAL FACTORS: Age, Behavior pattern, Fitness, Past/current experiences, Time since onset of injury/illness/exacerbation, and 3+ comorbidities: Dm, restrictive lung disease, cervical spinal stenosis, lumbar stenosis and radiculopathy  are also affecting patient's functional outcome.   REHAB POTENTIAL: Good  CLINICAL DECISION MAKING: Evolving/moderate complexity  EVALUATION COMPLEXITY: Moderate   GOALS: Goals reviewed with patient? Yes  SHORT TERM GOALS: Target date: 2 weeks 02/10/23  I HEP Baseline: initiated today Goal status: INITIAL   LONG TERM GOALS: Target date: 04/21/23  Improve gait speed to greater than 1 m/sec for improved community ambulation Baseline: 0.8 m/sec Goal status: INITIAL  2.  NDI improve from 21/50 to 11/50 Baseline:  Goal status: INITIAL  3.  Improve B LE strength to 5/5 with MMT except dorsiflexion R ankle Baseline: 4/5 gross Goal status: INITIAL    PLAN:  PT FREQUENCY: 1-2x/week  PT DURATION: 8 weeks  PLANNED INTERVENTIONS: Therapeutic exercises, Therapeutic activity, Neuromuscular re-education, Balance training, Gait training, Patient/Family education, Self Care, and Joint  mobilization  PLAN FOR NEXT SESSION: periscapular strengthening, dry needling, manual techniques as indicated   , PT, DPT, OCS 02/17/23 10:59 AM  02/17/2023, 10:59 AM

## 2023-02-19 ENCOUNTER — Ambulatory Visit: Payer: Medicare Other | Admitting: Physical Therapy

## 2023-02-19 ENCOUNTER — Encounter: Payer: Self-pay | Admitting: Physical Therapy

## 2023-02-19 DIAGNOSIS — M436 Torticollis: Secondary | ICD-10-CM | POA: Diagnosis not present

## 2023-02-19 DIAGNOSIS — R293 Abnormal posture: Secondary | ICD-10-CM

## 2023-02-19 DIAGNOSIS — R29898 Other symptoms and signs involving the musculoskeletal system: Secondary | ICD-10-CM

## 2023-02-19 NOTE — Therapy (Addendum)
OUTPATIENT PHYSICAL THERAPY CERVICAL TREATMENT   Patient Name: Marteze Maruca MRN: [id number] DOB:[birth year], 77 y.o., male Today's Date: 02/19/2023  END OF SESSION:  PT End of Session - 02/19/23 0935     Visit Number 5    Date for PT Re-Evaluation 04/21/23    PT Start Time 0930    PT Stop Time 1010    PT Time Calculation (min) 40 min    Activity Tolerance Patient tolerated treatment well    Behavior During Therapy [facility] for tasks assessed/performed                 Past Medical History:  Diagnosis Date   Abdominal bloating 03/13/2019   Allergic rhinitis    Allergy    Arthritis    Asthma    Blood in urine    Cataract    forming    Chronic cough    CKD (chronic kidney disease), stage II    DDD (degenerative disc disease)    Diabetes mellitus (HCC)    DOE (dyspnea on exertion)    Eustachian tube dysfunction    Fatty liver    GERD (gastroesophageal reflux disease)    Glaucoma    Gout    Hard of hearing    bilateral    Heme positive stool 03/13/2019   History of cardiomegaly 05/16/2019   Noted on CXR   History of kidney stones    Hyperlipidemia    Hypertension    Inguinal hernia    Bilateral   Joint pain, knee    Leg edema    Lung tumor (benign), right    Right upper lobe lung mass   Obesity    OSA on CPAP    Pneumonitis    right lung   Pulmonary HTN (HCC) 04/18/2013   Restrictive lung disease    Sciatica    Spinal stenosis    Wears contact lenses    Wears glasses    Wears hearing aid in both ears    Past Surgical History:  Procedure Laterality Date   APPENDECTOMY  1976   cataract surgery Bilateral    march and april 2023   COLONOSCOPY  08/2010   COLONOSCOPY  08/07/2019   HEMORRHOID SURGERY  2001   INGUINAL HERNIA REPAIR Left 1948   as a baby 52 months old   INGUINAL HERNIA REPAIR Bilateral 09/21/2019   Procedure: LAPAROSCOPIC BILATERAL INGUINAL HERNIA REPAIR, LYSIS OF ADHESIONS;  Surgeon: Karie Soda, MD;  Location: [facility];  Service: General;  Laterality: Bilateral;   LEFT AND RIGHT HEART CATHETERIZATION WITH CORONARY ANGIOGRAM N/A 04/20/2013   Procedure: LEFT AND RIGHT HEART CATHETERIZATION WITH CORONARY ANGIOGRAM;  Surgeon: Marykay Lex, MD;  Location: [facility] CATH LAB;  Angiographically normal Coronary Arteries - L Dom Cx (OM1, small LPL1 then LPL2 & PDA) & wraparound LAD (D1-3 small).  Borderline CO/CI - 4.94/2.16 by both Fick & TD.  RAP ~14 mmHg, RVEDP 15 mmHg.  LVEDP 19 mmHg (->diastolic dysfunction) PCWP 16 mmHg, but PA Mean 25 mmHg   LITHOTRIPSY     x 2   LOBECTOMY Right    lumbar disectomy L4-5  1980   NM MYOVIEW LTD  06/15/2019    EF 70%.  No ischemia or infarction.  LOW RISK    TRANSTHORACIC ECHOCARDIOGRAM  04/21/2019   EF 60 to 65%.  Mild LVH.  Normal RV.  Normal atrial size.  Mild aortic sclerosis.  No stenosis. Lipomatous intra-atrial septum   UPPER GI ENDOSCOPY  08/07/2019   VIDEO ASSISTED THORACOSCOPY (VATS)/WEDGE RESECTION Right 08/30/2013   Procedure: VIDEO ASSISTED THORACOSCOPY (VATS)/WEDGE RESECTION;  Surgeon: Loreli Slot, MD;  Location: [facility] OR;  Service: Thoracic;  Laterality: Right;   Patient Active Problem List   Diagnosis Date Noted   Chronic right-sided low back pain with right-sided sciatica 11/20/2022   Epigastric pain 04/27/2022   Aortic atherosclerosis (HCC) 10/07/2021   Allergic rhinitis 10/02/2021   Difficult airway for intubation 09/21/2019   Bilateral inguinal hernia (BIH) 09/21/2019   Asthma 05/25/2019   Generalized abdominal pain 03/13/2019   Chronic cough 09/08/2018   Spinal stenosis in cervical region 06/07/2018   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   CKD (chronic kidney disease) stage 2, GFR 60-89 ml/min 05/17/2018   OSA on CPAP 05/17/2018   Restrictive lung disease 05/17/2018   Cervical radiculopathy 05/17/2018   Diabetic neuropathy associated with type 2 diabetes mellitus (HCC) 05/17/2018   Hyperuricemia  05/17/2018   Dermatochalasis of both upper eyelids 02/25/2017   Acute angle-closure glaucoma of both eyes 12/04/2016   DM (diabetes mellitus) (HCC) 12/01/2016   Hyperopia with astigmatism and presbyopia, bilateral 12/01/2016   Nuclear sclerotic cataract of left eye 12/01/2016   Posterior subcapsular age-related cataract of both eyes 12/01/2016   Vitreous floater, bilateral 12/01/2016   Scarring of lung 08/30/2013   Microvascular angina 04/18/2013   Pre-operative cardiovascular examination 04/18/2013   Dyspnea on exertion 04/17/2013   Leg edema    Hyperlipidemia associated with type 2 diabetes mellitus (HCC)    HTN (hypertension), benign     PCP: Conni Elliot, NP  REFERRING PROVIDER: Estill Bamberg, MD  REFERRING DIAG: cervical spine stenosis, lumbar spinal stenosis and radiculopathy  THERAPY DIAG:  Abnormal posture  Stiffness of cervical spine  Weakness of both legs  Rationale for Evaluation and Treatment: Rehabilitation  ONSET DATE: chronic  SUBJECTIVE:                                                                                                                                                                                                         SUBJECTIVE STATEMENT: Patient reports continued neck pain, He reports that the STM and the stretching with a pillowcase are the most effective to help deal with pain. He plans to call the Dr to see about getting some neck injections.  Hand dominance: Right  PERTINENT HISTORY:  Chronic R LE radiculopathy, L5/S1 transforaminal steroid injection on R  on 12/10/22, with good relief of R LE radiculopathy.  cervical spine injections 2 weeks ago , didn't  help as much as the lumbar injection. Referred to PT by orthopedist to address both neck and back pain and stiffness  PAIN:  Are you having pain? Yes: NPRS scale: 4/10 Pain location: stiff , sore muscles in neck and B upper traps Pain description: stiffness primarily   Aggravating factors: no specific aggravating positions or movements Relieving factors: Tylenol  PRECAUTIONS: None  RED FLAGS: None   WEIGHT BEARING RESTRICTIONS: No  FALLS:  Has patient fallen in last 6 months? No  LIVING ENVIRONMENT: Lives with: lives with their spouse Lives in: House/apartment Stairs: Yes: Internal: 11 steps; on left going up Has following equipment at home: Dan Humphreys - 2 wheeled and [employer] - 4 wheeled  OCCUPATION: retired   PLOF: Independent and Needs assistance with homemaking  PATIENT GOALS: improve stiffness, soreness neck and back  NEXT MD VISIT: 8 weeks  OBJECTIVE:   DIAGNOSTIC FINDINGS:  1. Severe multilevel degenerative changes with osseous fusion across nearly every lumbar vertebral body level. There is severe spinal canal stenosis at L1-L4 secondary to a combination congenitally small pedicles degenerative disc disease, and ligamentum flavum hypertrophy 2. Multilevel neural foraminal narrowing, severe bilaterally at L5-S1.    Electronically Signed   By: Lorenza Cambridge M.D.   On: 11/25/2022 14:57  IMPRESSION: 1. 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 2. Advanced foraminal impingement bilaterally at C3-4 to C6-7. 3. Advanced facet osteoarthritis on the right at C1-2 with marrow edema and right C2 impingement. 4. Ankylosis at C2-3, C3-4, and C5-6.     Electronically Signed   By: Tiburcio Pea M.D.   On: 11/19/2022 07:53  PATIENT SURVEYS:  NDI 21/50  COGNITION: Overall cognitive status: Within functional limits for tasks assessed  SENSATION: Light touch: Impaired  R lower leg and foot   POSTURE: flexed trunk , B hips flexed to 20 degrees with forward trunk lean, sloped , rounded B shoulders  PALPATION: Stiff, tender B upper traps, B levator scapulae Minimal jt mobility cervical spine for lateral glides and for  cervical distraction   CERVICAL ROM:   Active ROM A/PROM (deg) eval  Flexion 40  Extension 25  Right lateral flexion   Left lateral flexion   Right rotation 30  Left rotation 0   (Blank rows = not tested) THORACIC SPINE ROM:   rotation 70 % B, FB 70 %, ext 20% LS ROM NT today UPPER EXTREMITY ROM:  Active ROM Right eval Left eval  Shoulder flexion 115   Shoulder extension    Shoulder abduction 110   Shoulder adduction    Shoulder extension    Shoulder internal rotation Behind back to t 11 Behind back to t 11  Shoulder external rotation    Elbow flexion    Elbow extension    Wrist flexion    Wrist extension    Wrist ulnar deviation    Wrist radial deviation    Wrist pronation    Wrist supination     (Blank rows = not tested) LOWER EXTREMITY ROM: B Hip flexion 95, IR 5 degrees, extension NT, B knees, ankles wnl UPPER EXTREMITY MMT:  MMT Right eval Left eval  Shoulder flexion    Shoulder extension    Shoulder abduction 3-   Shoulder adduction    Shoulder extension    Shoulder internal rotation    Shoulder external rotation 4   Middle trapezius    Lower trapezius  Elbow flexion 4-   Elbow extension    Wrist flexion    Wrist extension    Wrist ulnar deviation    Wrist radial deviation    Wrist pronation    Wrist supination    Grip strength     (Blank rows = wfl) LE MMT:  R ankle dorsiflexion gross strength B LE's 4/5 throughout  CERVICAL SPECIAL TESTS:  Spurling's test: Negative and Distraction test: Negative  FUNCTIONAL TESTS:  10 meter walk test: 8 sec or 0.8 m/sec  GAIT: decreased stance time on L, circumducts R LE in swing phase  TODAY'S TREATMENT:                                                                                                                              DATE: 02/19/23 Bike L3 x 6 minutes STM and stretch with manual distraction for neck, patient reported that the distraction felt good Mechanical traction, 14# x 8  minutes Seated shoulder ext and rows with G tband, with chin tuck to hold ball on wall, 2 x 10 each.  02/17/23: Nustep level 5, 6 min, Ue's and LE's to improve tissue perfusion, for light mid range motion extremities and spine and improved joint nutrition Review and practiced previously instructed ex: Seated cervical rotation SNAGs, utilized pillow case, then used webbing strap to improve rotation Seated cervical extension with webbing strap for extension motion  Manual: supine for alternating between manual cervical traction, stretching of upper traps, levator scapulae, and suboccipital musculature, 8 to 10 x each. Also isometrics to engage levator B, and for cervical rotation, cervical occipitals, in bouts of 5 sec holds, 6 to 10 reps for each.  02/01/23 NuStep L5 x 6 minutes STM and stretch to posterior cervical muscles, head pull Chin tucks on towel roll x 10 Seated lower cerv/ up thoracic stretch Seated lats, pushing into mat behind hips Standing shoulder ext, rows, ER against G tband MH to neck for acute pain relief.  01/29/23 NuStep L5 x66mins  Pec stretch 30s x2  Standing against wall shoulder flexion with dowel 2x5  Rows and ext green 2x10  Cervical ext and rotations with towel x10  Horizontal abd red x10  PROM to neck and STM and stretching of upper traps MH for 5 mins   01/27/23:  PT evaluation, instructed in HEP as described below, to improve ease of achieving upright posture  PATIENT EDUCATION:  Education details: POC, goals  Person educated: Patient Education method: Explanation, Demonstration, Tactile cues, and Verbal cues Education comprehension: verbalized understanding, returned demonstration, and verbal cues required  HOME EXERCISE PROGRAM: Access Code: [email or web address] URL: [email or web address] Date: 01/27/2023 Prepared by: Amy Speaks  Exercises - Supine Lower Trunk Rotation  - 1 x daily - 7 x weekly - 3 sets - 10 reps - Standing shoulder flexion  wall slides  - 1 x daily - 7 x weekly - 3 sets - 10 reps - Doorway Pec Stretch at 90  Degrees Abduction  - 1 x daily - 7 x weekly - 3 sets - 10 reps - Hooklying Single Knee to Chest Stretch  - 1 x daily - 7 x weekly - 3 sets - 10 reps  ASSESSMENT:  CLINICAL IMPRESSION: Patient reports no real changes, but the STM/stretches, and cervical traction do seem the most effective. Trialed some mechanical traction, which did seem to give him relief, sot educated him to home traction options. Continued with postural strength and stability exercises to improve his cervical position in standing.  OBJECTIVE IMPAIRMENTS: Abnormal gait, decreased balance, difficulty walking, decreased ROM, decreased strength, impaired flexibility, impaired sensation, postural dysfunction, and pain.   ACTIVITY LIMITATIONS: carrying, lifting, squatting, stairs, transfers, and locomotion level  PARTICIPATION LIMITATIONS: community activity, occupation, and yard work  PERSONAL FACTORS: Age, Behavior pattern, Fitness, Past/current experiences, Time since onset of injury/illness/exacerbation, and 3+ comorbidities: Dm, restrictive lung disease, cervical spinal stenosis, lumbar stenosis and radiculopathy  are also affecting patient's functional outcome.   REHAB POTENTIAL: Good  CLINICAL DECISION MAKING: Evolving/moderate complexity  EVALUATION COMPLEXITY: Moderate   GOALS: Goals reviewed with patient? Yes  SHORT TERM GOALS: Target date: 2 weeks 02/10/23  I HEP Baseline: initiated today Goal status: 02/19/23-Program initiated, ongoing   LONG TERM GOALS: Target date: 04/21/23  Improve gait speed to greater than 1 m/sec for improved community ambulation Baseline: 0.8 m/sec Goal status: INITIAL  2.  NDI improve from 21/50 to 11/50 Baseline:  Goal status: INITIAL  3.  Improve B LE strength to 5/5 with MMT except dorsiflexion R ankle Baseline: 4/5 gross Goal status: INITIAL  PLAN:  PT FREQUENCY: 1-2x/week  PT  DURATION: 8 weeks  PLANNED INTERVENTIONS: Therapeutic exercises, Therapeutic activity, Neuromuscular re-education, Balance training, Gait training, Patient/Family education, Self Care, and Joint mobilization  PLAN FOR NEXT SESSION: periscapular strengthening, dry needling, manual techniques as indicated  Oley Balm DPT 02/19/23 11:06 AM

## 2023-02-22 ENCOUNTER — Other Ambulatory Visit: Payer: Self-pay | Admitting: Nurse Practitioner

## 2023-02-22 DIAGNOSIS — M5416 Radiculopathy, lumbar region: Secondary | ICD-10-CM

## 2023-02-23 ENCOUNTER — Ambulatory Visit: Payer: Medicare Other

## 2023-02-23 DIAGNOSIS — M436 Torticollis: Secondary | ICD-10-CM | POA: Diagnosis not present

## 2023-02-23 DIAGNOSIS — R293 Abnormal posture: Secondary | ICD-10-CM | POA: Diagnosis not present

## 2023-02-23 DIAGNOSIS — R29898 Other symptoms and signs involving the musculoskeletal system: Secondary | ICD-10-CM

## 2023-02-23 NOTE — Therapy (Signed)
OUTPATIENT PHYSICAL THERAPY CERVICAL TREATMENT   Patient Name: Andres Taylor MRN: [id number] DOB:[birth year], 77 y.o., male Today's Date: 02/23/2023  END OF SESSION:  PT End of Session - 02/23/23 1620     Visit Number 6    Date for PT Re-Evaluation 04/21/23    PT Start Time 1625    PT Stop Time 1710    PT Time Calculation (min) 45 min    Activity Tolerance Patient tolerated treatment well    Behavior During Therapy [facility] for tasks assessed/performed                  Past Medical History:  Diagnosis Date   Abdominal bloating 03/13/2019   Allergic rhinitis    Allergy    Arthritis    Asthma    Blood in urine    Cataract    forming    Chronic cough    CKD (chronic kidney disease), stage II    DDD (degenerative disc disease)    Diabetes mellitus (HCC)    DOE (dyspnea on exertion)    Eustachian tube dysfunction    Fatty liver    GERD (gastroesophageal reflux disease)    Glaucoma    Gout    Hard of hearing    bilateral    Heme positive stool 03/13/2019   History of cardiomegaly 05/16/2019   Noted on CXR   History of kidney stones    Hyperlipidemia    Hypertension    Inguinal hernia    Bilateral   Joint pain, knee    Leg edema    Lung tumor (benign), right    Right upper lobe lung mass   Obesity    OSA on CPAP    Pneumonitis    right lung   Pulmonary HTN (HCC) 04/18/2013   Restrictive lung disease    Sciatica    Spinal stenosis    Wears contact lenses    Wears glasses    Wears hearing aid in both ears    Past Surgical History:  Procedure Laterality Date   APPENDECTOMY  1976   cataract surgery Bilateral    march and april 2023   COLONOSCOPY  08/2010   COLONOSCOPY  08/07/2019   HEMORRHOID SURGERY  2001   INGUINAL HERNIA REPAIR Left 1948   as a baby 67 months old   INGUINAL HERNIA REPAIR Bilateral 09/21/2019   Procedure: LAPAROSCOPIC BILATERAL INGUINAL HERNIA REPAIR, LYSIS OF ADHESIONS;  Surgeon: Karie Soda, MD;  Location: [facility];  Service: General;  Laterality: Bilateral;   LEFT AND RIGHT HEART CATHETERIZATION WITH CORONARY ANGIOGRAM N/A 04/20/2013   Procedure: LEFT AND RIGHT HEART CATHETERIZATION WITH CORONARY ANGIOGRAM;  Surgeon: Marykay Lex, MD;  Location: [facility] C[facility];  Angiographically normal Coronary Arteries - L Dom Cx (OM1, small LPL1 then LPL2 & PDA) & wraparound LAD (D1-3 small).  Borderline CO/CI - 4.94/2.16 by both Fick & TD.  RAP ~14 mmHg, RVEDP 15 mmHg.  LVEDP 19 mmHg (->diastolic dysfunction) PCWP 16 mmHg, but PA Mean 25 mmHg   LITHOTRIPSY     x 2   LOBECTOMY Right    lumbar disectomy L4-5  1980   NM MYOVIEW LTD  06/15/2019    EF 70%.  No ischemia or infarction.  LOW RISK    TRANSTHORACIC ECHOCARDIOGRAM  04/21/2019   EF 60 to 65%.  Mild LVH.  Normal RV.  Normal atrial size.  Mild aortic sclerosis.  No stenosis. Lipomatous intra-atrial septum   UPPER GI  ENDOSCOPY  08/07/2019   VIDEO ASSISTED THORACOSCOPY (VATS)/WEDGE RESECTION Right 08/30/2013   Procedure: VIDEO ASSISTED THORACOSCOPY (VATS)/WEDGE RESECTION;  Surgeon: Loreli Slot, MD;  Location: [facility] OR;  Service: Thoracic;  Laterality: Right;   Patient Active Problem List   Diagnosis Date Noted   Chronic right-sided low back pain with right-sided sciatica 11/20/2022   Epigastric pain 04/27/2022   Aortic atherosclerosis (HCC) 10/07/2021   Allergic rhinitis 10/02/2021   Difficult airway for intubation 09/21/2019   Bilateral inguinal hernia (BIH) 09/21/2019   Asthma 05/25/2019   Generalized abdominal pain 03/13/2019   Chronic cough 09/08/2018   Spinal stenosis in cervical region 06/07/2018   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   CKD (chronic kidney disease) stage 2, GFR 60-89 ml/min 05/17/2018   OSA on CPAP 05/17/2018   Restrictive lung disease 05/17/2018   Cervical radiculopathy 05/17/2018   Diabetic neuropathy associated with type 2 diabetes mellitus (HCC) 05/17/2018   Hyperuricemia  05/17/2018   Dermatochalasis of both upper eyelids 02/25/2017   Acute angle-closure glaucoma of both eyes 12/04/2016   DM (diabetes mellitus) (HCC) 12/01/2016   Hyperopia with astigmatism and presbyopia, bilateral 12/01/2016   Nuclear sclerotic cataract of left eye 12/01/2016   Posterior subcapsular age-related cataract of both eyes 12/01/2016   Vitreous floater, bilateral 12/01/2016   Scarring of lung 08/30/2013   Microvascular angina 04/18/2013   Pre-operative cardiovascular examination 04/18/2013   Dyspnea on exertion 04/17/2013   Leg edema    Hyperlipidemia associated with type 2 diabetes mellitus (HCC)    HTN (hypertension), benign     PCP: Conni Elliot, NP  REFERRING PROVIDER: Estill Bamberg, MD  REFERRING DIAG: cervical spine stenosis, lumbar spinal stenosis and radiculopathy  THERAPY DIAG:  Abnormal posture  Stiffness of cervical spine  Weakness of both legs  Rationale for Evaluation and Treatment: Rehabilitation  ONSET DATE: chronic  SUBJECTIVE:                                                                                                                                                                                                         SUBJECTIVE STATEMENT: Patient reports continued neck pain, He reports that the STM and the stretching with a pillowcase are the most effective to help deal with pain. He plans to call the Dr to see about getting some neck injections.  Hand dominance: Right  PERTINENT HISTORY:  Chronic R LE radiculopathy, L5/S1 transforaminal steroid injection on R  on 12/10/22, with good relief of R LE radiculopathy.  cervical spine injections 2 weeks ago ,  didn't help as much as the lumbar injection. Referred to PT by orthopedist to address both neck and back pain and stiffness  PAIN:  Are you having pain? Yes: NPRS scale: 4/10 Pain location: stiff , sore muscles in neck and B upper traps Pain description: stiffness primarily   Aggravating factors: no specific aggravating positions or movements Relieving factors: Tylenol  PRECAUTIONS: None  RED FLAGS: None   WEIGHT BEARING RESTRICTIONS: No  FALLS:  Has patient fallen in last 6 months? No  LIVING ENVIRONMENT: Lives with: lives with their spouse Lives in: House/apartment Stairs: Yes: Internal: 11 steps; on left going up Has following equipment at home: Dan Humphreys - 2 wheeled and [employer] - 4 wheeled  OCCUPATION: retired   PLOF: Independent and Needs assistance with homemaking  PATIENT GOALS: improve stiffness, soreness neck and back  NEXT MD VISIT: 8 weeks  OBJECTIVE:   DIAGNOSTIC FINDINGS:  1. Severe multilevel degenerative changes with osseous fusion across nearly every lumbar vertebral body level. There is severe spinal canal stenosis at L1-L4 secondary to a combination congenitally small pedicles degenerative disc disease, and ligamentum flavum hypertrophy 2. Multilevel neural foraminal narrowing, severe bilaterally at L5-S1.    Electronically Signed   By: Lorenza Cambridge M.D.   On: 11/25/2022 14:57  IMPRESSION: 1. 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 2. Advanced foraminal impingement bilaterally at C3-4 to C6-7. 3. Advanced facet osteoarthritis on the right at C1-2 with marrow edema and right C2 impingement. 4. Ankylosis at C2-3, C3-4, and C5-6.     Electronically Signed   By: Tiburcio Pea M.D.   On: 11/19/2022 07:53  PATIENT SURVEYS:  NDI 21/50  COGNITION: Overall cognitive status: Within functional limits for tasks assessed  SENSATION: Light touch: Impaired  R lower leg and foot   POSTURE: flexed trunk , B hips flexed to 20 degrees with forward trunk lean, sloped , rounded B shoulders  PALPATION: Stiff, tender B upper traps, B levator scapulae Minimal jt mobility cervical spine for lateral glides and for  cervical distraction   CERVICAL ROM:   Active ROM A/PROM (deg) eval  Flexion 40  Extension 25  Right lateral flexion   Left lateral flexion   Right rotation 30  Left rotation 0   (Blank rows = not tested) THORACIC SPINE ROM:   rotation 70 % B, FB 70 %, ext 20% LS ROM NT today UPPER EXTREMITY ROM:  Active ROM Right eval Left eval  Shoulder flexion 115   Shoulder extension    Shoulder abduction 110   Shoulder adduction    Shoulder extension    Shoulder internal rotation Behind back to t 11 Behind back to t 11  Shoulder external rotation    Elbow flexion    Elbow extension    Wrist flexion    Wrist extension    Wrist ulnar deviation    Wrist radial deviation    Wrist pronation    Wrist supination     (Blank rows = not tested) LOWER EXTREMITY ROM: B Hip flexion 95, IR 5 degrees, extension NT, B knees, ankles wnl UPPER EXTREMITY MMT:  MMT Right eval Left eval  Shoulder flexion    Shoulder extension    Shoulder abduction 3-   Shoulder adduction    Shoulder extension    Shoulder internal rotation    Shoulder external rotation 4   Middle trapezius    Lower trapezius  Elbow flexion 4-   Elbow extension    Wrist flexion    Wrist extension    Wrist ulnar deviation    Wrist radial deviation    Wrist pronation    Wrist supination    Grip strength     (Blank rows = wfl) LE MMT:  R ankle dorsiflexion gross strength B LE's 4/5 throughout  CERVICAL SPECIAL TESTS:  Spurling's test: Negative and Distraction test: Negative  FUNCTIONAL TESTS:  10 meter walk test: 8 sec or 0.8 m/sec  GAIT: decreased stance time on L, circumducts R LE in swing phase  TODAY'S TREATMENT:                                                                                                                              DATE: 02/22/23 NuStep L5 x71mins  Chin tucks red band 2x10  4# UT stretch 30s STM and stretch rotations and side bending Rows and ext green 2x10  Manual distraction in  supine    02/19/23 Bike L3 x 6 minutes STM and stretch with manual distraction for neck, patient reported that the distraction felt good Mechanical traction, 14# x 8 minutes Seated shoulder ext and rows with G tband, with chin tuck to hold ball on wall, 2 x 10 each.  02/17/23: Nustep level 5, 6 min, Ue's and LE's to improve tissue perfusion, for light mid range motion extremities and spine and improved joint nutrition Review and practiced previously instructed ex: Seated cervical rotation SNAGs, utilized pillow case, then used webbing strap to improve rotation Seated cervical extension with webbing strap for extension motion  Manual: supine for alternating between manual cervical traction, stretching of upper traps, levator scapulae, and suboccipital musculature, 8 to 10 x each. Also isometrics to engage levator B, and for cervical rotation, cervical occipitals, in bouts of 5 sec holds, 6 to 10 reps for each.  02/01/23 NuStep L5 x 6 minutes STM and stretch to posterior cervical muscles, head pull Chin tucks on towel roll x 10 Seated lower cerv/ up thoracic stretch Seated lats, pushing into mat behind hips Standing shoulder ext, rows, ER against G tband MH to neck for acute pain relief.  01/29/23 NuStep L5 x43mins  Pec stretch 30s x2  Standing against wall shoulder flexion with dowel 2x5  Rows and ext green 2x10  Cervical ext and rotations with towel x10  Horizontal abd red x10  PROM to neck and STM and stretching of upper traps MH for 5 mins   01/27/23:  PT evaluation, instructed in HEP as described below, to improve ease of achieving upright posture  PATIENT EDUCATION:  Education details: POC, goals  Person educated: Patient Education method: Explanation, Demonstration, Tactile cues, and Verbal cues Education comprehension: verbalized understanding, returned demonstration, and verbal cues required  HOME EXERCISE PROGRAM: Access Code: [email or web address] URL:  [email or web address] Date: 01/27/2023 Prepared by: Amy Speaks  Exercises - Supine Lower Trunk Rotation  - 1 x daily - 7  x weekly - 3 sets - 10 reps - Standing shoulder flexion wall slides  - 1 x daily - 7 x weekly - 3 sets - 10 reps - Doorway Pec Stretch at 90 Degrees Abduction  - 1 x daily - 7 x weekly - 3 sets - 10 reps - Hooklying Single Knee to Chest Stretch  - 1 x daily - 7 x weekly - 3 sets - 10 reps  ASSESSMENT:  CLINICAL IMPRESSION: Patient reports no real changes, but the STM/stretches, and cervical traction do seem the most effective. States his at home traction helps out a lot. Continued with postural strength and stability exercises to improve his cervical position in standing. Still very tight in upper traps. Could benefit from some dry needling.   OBJECTIVE IMPAIRMENTS: Abnormal gait, decreased balance, difficulty walking, decreased ROM, decreased strength, impaired flexibility, impaired sensation, postural dysfunction, and pain.   ACTIVITY LIMITATIONS: carrying, lifting, squatting, stairs, transfers, and locomotion level  PARTICIPATION LIMITATIONS: community activity, occupation, and yard work  PERSONAL FACTORS: Age, Behavior pattern, Fitness, Past/current experiences, Time since onset of injury/illness/exacerbation, and 3+ comorbidities: Dm, restrictive lung disease, cervical spinal stenosis, lumbar stenosis and radiculopathy  are also affecting patient's functional outcome.   REHAB POTENTIAL: Good  CLINICAL DECISION MAKING: Evolving/moderate complexity  EVALUATION COMPLEXITY: Moderate   GOALS: Goals reviewed with patient? Yes  SHORT TERM GOALS: Target date: 2 weeks 02/10/23  I HEP Baseline: initiated today Goal status: 02/19/23-Program initiated, ongoing   LONG TERM GOALS: Target date: 04/21/23  Improve gait speed to greater than 1 m/sec for improved community ambulation Baseline: 0.8 m/sec Goal status: INITIAL  2.  NDI improve from 21/50 to  11/50 Baseline:  Goal status: INITIAL  3.  Improve B LE strength to 5/5 with MMT except dorsiflexion R ankle Baseline: 4/5 gross Goal status: INITIAL  PLAN:  PT FREQUENCY: 1-2x/week  PT DURATION: 8 weeks  PLANNED INTERVENTIONS: Therapeutic exercises, Therapeutic activity, Neuromuscular re-education, Balance training, Gait training, Patient/Family education, Self Care, and Joint mobilization  PLAN FOR NEXT SESSION: periscapular strengthening, dry needling, manual techniques as indicated  Cassie Freer, DPT 02/23/23 5:09 PM

## 2023-02-26 ENCOUNTER — Ambulatory Visit: Payer: Medicare Other | Admitting: Physical Therapy

## 2023-02-26 ENCOUNTER — Encounter: Payer: Self-pay | Admitting: Physical Therapy

## 2023-02-26 DIAGNOSIS — R29898 Other symptoms and signs involving the musculoskeletal system: Secondary | ICD-10-CM | POA: Diagnosis not present

## 2023-02-26 DIAGNOSIS — M436 Torticollis: Secondary | ICD-10-CM

## 2023-02-26 DIAGNOSIS — R293 Abnormal posture: Secondary | ICD-10-CM

## 2023-02-26 NOTE — Therapy (Signed)
OUTPATIENT PHYSICAL THERAPY CERVICAL TREATMENT   Patient Name: Andres Taylor MRN: [id number] DOB:05/11/1946, 77 y.o., male Today's Date: 02/26/2023  END OF SESSION:  PT End of Session - 02/26/23 0850     Visit Number 7    Date for PT Re-Evaluation 04/21/23    PT Start Time 0845    PT Stop Time 0925    PT Time Calculation (min) 40 min    Activity Tolerance Patient tolerated treatment well    Behavior During Therapy [facility] for tasks assessed/performed                   Past Medical History:  Diagnosis Date   Abdominal bloating 03/13/2019   Allergic rhinitis    Allergy    Arthritis    Asthma    Blood in urine    Cataract    forming    Chronic cough    CKD (chronic kidney disease), stage II    DDD (degenerative disc disease)    Diabetes mellitus (HCC)    DOE (dyspnea on exertion)    Eustachian tube dysfunction    Fatty liver    GERD (gastroesophageal reflux disease)    Glaucoma    Gout    Hard of hearing    bilateral    Heme positive stool 03/13/2019   History of cardiomegaly 05/16/2019   Noted on CXR   History of kidney stones    Hyperlipidemia    Hypertension    Inguinal hernia    Bilateral   Joint pain, knee    Leg edema    Lung tumor (benign), right    Right upper lobe lung mass   Obesity    OSA on CPAP    Pneumonitis    right lung   Pulmonary HTN (HCC) 04/18/2013   Restrictive lung disease    Sciatica    Spinal stenosis    Wears contact lenses    Wears glasses    Wears hearing aid in both ears    Past Surgical History:  Procedure Laterality Date   APPENDECTOMY  1976   cataract surgery Bilateral    march and april 2023   COLONOSCOPY  08/2010   COLONOSCOPY  08/07/2019   HEMORRHOID SURGERY  2001   INGUINAL HERNIA REPAIR Left 1948   as a baby 65 months old   INGUINAL HERNIA REPAIR Bilateral 09/21/2019   Procedure: LAPAROSCOPIC BILATERAL INGUINAL HERNIA REPAIR, LYSIS OF ADHESIONS;  Surgeon: Karie Soda, MD;  Location: [facility];  Service: General;  Laterality: Bilateral;   LEFT AND RIGHT HEART CATHETERIZATION WITH CORONARY ANGIOGRAM N/A 04/20/2013   Procedure: LEFT AND RIGHT HEART CATHETERIZATION WITH CORONARY ANGIOGRAM;  Surgeon: Marykay Lex, MD;  Location: [facility] CATH LAB;  Angiographically normal Coronary Arteries - L Dom Cx (OM1, small LPL1 then LPL2 & PDA) & wraparound LAD (D1-3 small).  Borderline CO/CI - 4.94/2.16 by both Fick & TD.  RAP ~14 mmHg, RVEDP 15 mmHg.  LVEDP 19 mmHg (->diastolic dysfunction) PCWP 16 mmHg, but PA Mean 25 mmHg   LITHOTRIPSY     x 2   LOBECTOMY Right    lumbar disectomy L4-5  1980   NM MYOVIEW LTD  06/15/2019    EF 70%.  No ischemia or infarction.  LOW RISK    TRANSTHORACIC ECHOCARDIOGRAM  04/21/2019   EF 60 to 65%.  Mild LVH.  Normal RV.  Normal atrial size.  Mild aortic sclerosis.  No stenosis. Lipomatous intra-atrial septum   UPPER  GI ENDOSCOPY  08/07/2019   VIDEO ASSISTED THORACOSCOPY (VATS)/WEDGE RESECTION Right 08/30/2013   Procedure: VIDEO ASSISTED THORACOSCOPY (VATS)/WEDGE RESECTION;  Surgeon: Loreli Slot, MD;  Location: [facility] OR;  Service: Thoracic;  Laterality: Right;   Patient Active Problem List   Diagnosis Date Noted   Chronic right-sided low back pain with right-sided sciatica 11/20/2022   Epigastric pain 04/27/2022   Aortic atherosclerosis (HCC) 10/07/2021   Allergic rhinitis 10/02/2021   Difficult airway for intubation 09/21/2019   Bilateral inguinal hernia (BIH) 09/21/2019   Asthma 05/25/2019   Generalized abdominal pain 03/13/2019   Chronic cough 09/08/2018   Spinal stenosis in cervical region 06/07/2018   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   CKD (chronic kidney disease) stage 2, GFR 60-89 ml/min 05/17/2018   OSA on CPAP 05/17/2018   Restrictive lung disease 05/17/2018   Cervical radiculopathy 05/17/2018   Diabetic neuropathy associated with type 2 diabetes mellitus (HCC) 05/17/2018   Hyperuricemia  05/17/2018   Dermatochalasis of both upper eyelids 02/25/2017   Acute angle-closure glaucoma of both eyes 12/04/2016   DM (diabetes mellitus) (HCC) 12/01/2016   Hyperopia with astigmatism and presbyopia, bilateral 12/01/2016   Nuclear sclerotic cataract of left eye 12/01/2016   Posterior subcapsular age-related cataract of both eyes 12/01/2016   Vitreous floater, bilateral 12/01/2016   Scarring of lung 08/30/2013   Microvascular angina 04/18/2013   Pre-operative cardiovascular examination 04/18/2013   Dyspnea on exertion 04/17/2013   Leg edema    Hyperlipidemia associated with type 2 diabetes mellitus (HCC)    HTN (hypertension), benign     PCP: Conni Elliot, NP  REFERRING PROVIDER: Estill Bamberg, MD  REFERRING DIAG: cervical spine stenosis, lumbar spinal stenosis and radiculopathy  THERAPY DIAG:  Abnormal posture  Stiffness of cervical spine  Weakness of both legs  Rationale for Evaluation and Treatment: Rehabilitation  ONSET DATE: chronic  SUBJECTIVE:                                                                                                                                                                                                         SUBJECTIVE STATEMENT: Patient reports continued neck pain, He reports that the STM and the stretching with a pillowcase are the most effective to help deal with pain. He plans to call the Dr to see about getting some neck injections.  Hand dominance: Right  PERTINENT HISTORY:  Chronic R LE radiculopathy, L5/S1 transforaminal steroid injection on R  on 12/10/22, with good relief of R LE radiculopathy.  cervical spine injections 2 weeks  ago , didn't help as much as the lumbar injection. Referred to PT by orthopedist to address both neck and back pain and stiffness  PAIN:  Are you having pain? Yes: NPRS scale: 4/10 Pain location: stiff , sore muscles in neck and B upper traps Pain description: stiffness primarily   Aggravating factors: no specific aggravating positions or movements Relieving factors: Tylenol  PRECAUTIONS: None  RED FLAGS: None   WEIGHT BEARING RESTRICTIONS: No  FALLS:  Has patient fallen in last 6 months? No  LIVING ENVIRONMENT: Lives with: lives with their spouse Lives in: House/apartment Stairs: Yes: Internal: 11 steps; on left going up Has following equipment at home: Dan Humphreys - 2 wheeled and [employer] - 4 wheeled  OCCUPATION: retired   PLOF: Independent and Needs assistance with homemaking  PATIENT GOALS: improve stiffness, soreness neck and back  NEXT MD VISIT: 8 weeks  OBJECTIVE:   DIAGNOSTIC FINDINGS:  1. Severe multilevel degenerative changes with osseous fusion across nearly every lumbar vertebral body level. There is severe spinal canal stenosis at L1-L4 secondary to a combination congenitally small pedicles degenerative disc disease, and ligamentum flavum hypertrophy 2. Multilevel neural foraminal narrowing, severe bilaterally at L5-S1.    Electronically Signed   By: Lorenza Cambridge M.D.   On: 11/25/2022 14:57  IMPRESSION: 1. 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 2. Advanced foraminal impingement bilaterally at C3-4 to C6-7. 3. Advanced facet osteoarthritis on the right at C1-2 with marrow edema and right C2 impingement. 4. Ankylosis at C2-3, C3-4, and C5-6.     Electronically Signed   By: Tiburcio Pea M.D.   On: 11/19/2022 07:53  PATIENT SURVEYS:  NDI 21/50  COGNITION: Overall cognitive status: Within functional limits for tasks assessed  SENSATION: Light touch: Impaired  R lower leg and foot   POSTURE: flexed trunk , B hips flexed to 20 degrees with forward trunk lean, sloped , rounded B shoulders  PALPATION: Stiff, tender B upper traps, B levator scapulae Minimal jt mobility cervical spine for lateral glides and for  cervical distraction   CERVICAL ROM:   Active ROM A/PROM (deg) eval  Flexion 40  Extension 25  Right lateral flexion   Left lateral flexion   Right rotation 30  Left rotation 0   (Blank rows = not tested) THORACIC SPINE ROM:   rotation 70 % B, FB 70 %, ext 20% LS ROM NT today UPPER EXTREMITY ROM:  Active ROM Right eval Left eval  Shoulder flexion 115   Shoulder extension    Shoulder abduction 110   Shoulder adduction    Shoulder extension    Shoulder internal rotation Behind back to t 11 Behind back to t 11  Shoulder external rotation    Elbow flexion    Elbow extension    Wrist flexion    Wrist extension    Wrist ulnar deviation    Wrist radial deviation    Wrist pronation    Wrist supination     (Blank rows = not tested) LOWER EXTREMITY ROM: B Hip flexion 95, IR 5 degrees, extension NT, B knees, ankles wnl UPPER EXTREMITY MMT:  MMT Right eval Left eval  Shoulder flexion    Shoulder extension    Shoulder abduction 3-   Shoulder adduction    Shoulder extension    Shoulder internal rotation    Shoulder external rotation 4   Middle trapezius    Lower  trapezius    Elbow flexion 4-   Elbow extension    Wrist flexion    Wrist extension    Wrist ulnar deviation    Wrist radial deviation    Wrist pronation    Wrist supination    Grip strength     (Blank rows = wfl) LE MMT:  R ankle dorsiflexion gross strength B LE's 4/5 throughout  CERVICAL SPECIAL TESTS:  Spurling's test: Negative and Distraction test: Negative  FUNCTIONAL TESTS:  10 meter walk test: 8 sec or 0.8 m/sec  GAIT: decreased stance time on L, circumducts R LE in swing phase  TODAY'S TREATMENT:                                                                                                                              DATE: 02/26/23 Bike L3 x 6 minutes UBE L1 x 2 forward and back Supine STM to base of skull, cervical paraspinals, UT with deep pressure and stretch. Self DTM using 2 tennis  balls in a sock for base of skull to address tension headaches, then moved down to UT Seated with chin tuck to hold ball on wall-Shoulder ext, rows, ER, 2 x 10 reps each. Pect stretch on wall, 3 x 15 sec each side. Seated press ups to activate lats and stretch chest.  02/22/23 NuStep L5 x21mins  Chin tucks red band 2x10  4# UT stretch 30s STM and stretch rotations and side bending Rows and ext green 2x10  Manual distraction in supine   02/19/23 Bike L3 x 6 minutes STM and stretch with manual distraction for neck, patient reported that the distraction felt good Mechanical traction, 14# x 8 minutes Seated shoulder ext and rows with G tband, with chin tuck to hold ball on wall, 2 x 10 each.  02/17/23: Nustep level 5, 6 min, Ue's and LE's to improve tissue perfusion, for light mid range motion extremities and spine and improved joint nutrition Review and practiced previously instructed ex: Seated cervical rotation SNAGs, utilized pillow case, then used webbing strap to improve rotation Seated cervical extension with webbing strap for extension motion  Manual: supine for alternating between manual cervical traction, stretching of upper traps, levator scapulae, and suboccipital musculature, 8 to 10 x each. Also isometrics to engage levator B, and for cervical rotation, cervical occipitals, in bouts of 5 sec holds, 6 to 10 reps for each.  02/01/23 NuStep L5 x 6 minutes STM and stretch to posterior cervical muscles, head pull Chin tucks on towel roll x 10 Seated lower cerv/ up thoracic stretch Seated lats, pushing into mat behind hips Standing shoulder ext, rows, ER against G tband MH to neck for acute pain relief.  01/29/23 NuStep L5 x66mins  Pec stretch 30s x2  Standing against wall shoulder flexion with dowel 2x5  Rows and ext green 2x10  Cervical ext and rotations with towel x10  Horizontal abd red x10  PROM to neck and STM  and stretching of upper traps MH for 5 mins   01/27/23:  PT  evaluation, instructed in HEP as described below, to improve ease of achieving upright posture  PATIENT EDUCATION:  Education details: POC, goals  Person educated: Patient Education method: Explanation, Demonstration, Tactile cues, and Verbal cues Education comprehension: verbalized understanding, returned demonstration, and verbal cues required  HOME EXERCISE PROGRAM: Access Code: [email or web address] URL: [email or web address] Date: 01/27/2023 Prepared by: Amy Speaks  Exercises - Supine Lower Trunk Rotation  - 1 x daily - 7 x weekly - 3 sets - 10 reps - Standing shoulder flexion wall slides  - 1 x daily - 7 x weekly - 3 sets - 10 reps - Doorway Pec Stretch at 90 Degrees Abduction  - 1 x daily - 7 x weekly - 3 sets - 10 reps - Hooklying Single Knee to Chest Stretch  - 1 x daily - 7 x weekly - 3 sets - 10 reps  ASSESSMENT:  CLINICAL IMPRESSION: Patient reports no real changes, continues to report relief from traction and stretch. Introduced some additional activities, including STM with tennis balls to base of skull as he reports tension headaches. He responded well to treatment interventions, but unfortunately does not get much lasting relief.  OBJECTIVE IMPAIRMENTS: Abnormal gait, decreased balance, difficulty walking, decreased ROM, decreased strength, impaired flexibility, impaired sensation, postural dysfunction, and pain.   ACTIVITY LIMITATIONS: carrying, lifting, squatting, stairs, transfers, and locomotion level  PARTICIPATION LIMITATIONS: community activity, occupation, and yard work  PERSONAL FACTORS: Age, Behavior pattern, Fitness, Past/current experiences, Time since onset of injury/illness/exacerbation, and 3+ comorbidities: Dm, restrictive lung disease, cervical spinal stenosis, lumbar stenosis and radiculopathy  are also affecting patient's functional outcome.   REHAB POTENTIAL: Good  CLINICAL DECISION MAKING: Evolving/moderate complexity  EVALUATION COMPLEXITY:  Moderate   GOALS: Goals reviewed with patient? Yes  SHORT TERM GOALS: Target date: 2 weeks 02/10/23  I HEP Baseline: initiated today Goal status: 02/19/23-Program initiated, ongoing   LONG TERM GOALS: Target date: 04/21/23  Improve gait speed to greater than 1 m/sec for improved community ambulation Baseline: 0.8 m/sec Goal status: INITIAL  2.  NDI improve from 21/50 to 11/50 Baseline:  Goal status: INITIAL  3.  Improve B LE strength to 5/5 with MMT except dorsiflexion R ankle Baseline: 4/5 gross Goal status: INITIAL  PLAN:  PT FREQUENCY: 1-2x/week  PT DURATION: 8 weeks  PLANNED INTERVENTIONS: Therapeutic exercises, Therapeutic activity, Neuromuscular re-education, Balance training, Gait training, Patient/Family education, Self Care, and Joint mobilization  PLAN FOR NEXT SESSION: periscapular strengthening, dry needling, manual techniques as indicated  Cassie Freer, DPT 02/26/23 9:25 AM

## 2023-03-03 ENCOUNTER — Ambulatory Visit: Payer: Medicare Other

## 2023-03-03 ENCOUNTER — Other Ambulatory Visit: Payer: Self-pay

## 2023-03-03 DIAGNOSIS — R29898 Other symptoms and signs involving the musculoskeletal system: Secondary | ICD-10-CM

## 2023-03-03 DIAGNOSIS — M436 Torticollis: Secondary | ICD-10-CM | POA: Diagnosis not present

## 2023-03-03 DIAGNOSIS — R293 Abnormal posture: Secondary | ICD-10-CM

## 2023-03-03 NOTE — Therapy (Signed)
OUTPATIENT PHYSICAL THERAPY CERVICAL TREATMENT   Patient Name: Andres Taylor MRN: [id number] DOB:Nov 24, 1945, 77 y.o., male Today's Date: 03/03/2023  END OF SESSION:  PT End of Session - 03/03/23 1501     Visit Number 8    Date for PT Re-Evaluation 04/21/23    Progress Note Due on Visit 10    PT Start Time 1430    PT Stop Time 1515    PT Time Calculation (min) 45 min                    Past Medical History:  Diagnosis Date   Abdominal bloating 03/13/2019   Allergic rhinitis    Allergy    Arthritis    Asthma    Blood in urine    Cataract    forming    Chronic cough    CKD (chronic kidney disease), stage II    DDD (degenerative disc disease)    Diabetes mellitus (HCC)    DOE (dyspnea on exertion)    Eustachian tube dysfunction    Fatty liver    GERD (gastroesophageal reflux disease)    Glaucoma    Gout    Hard of hearing    bilateral    Heme positive stool 03/13/2019   History of cardiomegaly 05/16/2019   Noted on CXR   History of kidney stones    Hyperlipidemia    Hypertension    Inguinal hernia    Bilateral   Joint pain, knee    Leg edema    Lung tumor (benign), right    Right upper lobe lung mass   Obesity    OSA on CPAP    Pneumonitis    right lung   Pulmonary HTN (HCC) 04/18/2013   Restrictive lung disease    Sciatica    Spinal stenosis    Wears contact lenses    Wears glasses    Wears hearing aid in both ears    Past Surgical History:  Procedure Laterality Date   APPENDECTOMY  1976   cataract surgery Bilateral    march and april 2023   COLONOSCOPY  08/2010   COLONOSCOPY  08/07/2019   HEMORRHOID SURGERY  2001   INGUINAL HERNIA REPAIR Left 1948   as a baby 24 months old   INGUINAL HERNIA REPAIR Bilateral 09/21/2019   Procedure: LAPAROSCOPIC BILATERAL INGUINAL HERNIA REPAIR, LYSIS OF ADHESIONS;  Surgeon: Karie Soda, MD;  Location: [facility];  Service: General;  Laterality: Bilateral;   LEFT AND RIGHT HEART  CATHETERIZATION WITH CORONARY ANGIOGRAM N/A 04/20/2013   Procedure: LEFT AND RIGHT HEART CATHETERIZATION WITH CORONARY ANGIOGRAM;  Surgeon: Marykay Lex, MD;  Location: [facility] CATH LAB;  Angiographically normal Coronary Arteries - L Dom Cx (OM1, small LPL1 then LPL2 & PDA) & wraparound LAD (D1-3 small).  Borderline CO/CI - 4.94/2.16 by both Fick & TD.  RAP ~14 mmHg, RVEDP 15 mmHg.  LVEDP 19 mmHg (->diastolic dysfunction) PCWP 16 mmHg, but PA Mean 25 mmHg   LITHOTRIPSY     x 2   LOBECTOMY Right    lumbar disectomy L4-5  1980   NM MYOVIEW LTD  06/15/2019    EF 70%.  No ischemia or infarction.  LOW RISK    TRANSTHORACIC ECHOCARDIOGRAM  04/21/2019   EF 60 to 65%.  Mild LVH.  Normal RV.  Normal atrial size.  Mild aortic sclerosis.  No stenosis. Lipomatous intra-atrial septum   UPPER GI ENDOSCOPY  08/07/2019   VIDEO ASSISTED THORACOSCOPY (  VATS)/WEDGE RESECTION Right 08/30/2013   Procedure: VIDEO ASSISTED THORACOSCOPY (VATS)/WEDGE RESECTION;  Surgeon: Loreli Slot, MD;  Location: [facility] OR;  Service: Thoracic;  Laterality: Right;   Patient Active Problem List   Diagnosis Date Noted   Chronic right-sided low back pain with right-sided sciatica 11/20/2022   Epigastric pain 04/27/2022   Aortic atherosclerosis (HCC) 10/07/2021   Allergic rhinitis 10/02/2021   Difficult airway for intubation 09/21/2019   Bilateral inguinal hernia (BIH) 09/21/2019   Asthma 05/25/2019   Generalized abdominal pain 03/13/2019   Chronic cough 09/08/2018   Spinal stenosis in cervical region 06/07/2018   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   CKD (chronic kidney disease) stage 2, GFR 60-89 ml/min 05/17/2018   OSA on CPAP 05/17/2018   Restrictive lung disease 05/17/2018   Cervical radiculopathy 05/17/2018   Diabetic neuropathy associated with type 2 diabetes mellitus (HCC) 05/17/2018   Hyperuricemia 05/17/2018   Dermatochalasis of both upper eyelids 02/25/2017   Acute angle-closure  glaucoma of both eyes 12/04/2016   DM (diabetes mellitus) (HCC) 12/01/2016   Hyperopia with astigmatism and presbyopia, bilateral 12/01/2016   Nuclear sclerotic cataract of left eye 12/01/2016   Posterior subcapsular age-related cataract of both eyes 12/01/2016   Vitreous floater, bilateral 12/01/2016   Scarring of lung 08/30/2013   Microvascular angina 04/18/2013   Pre-operative cardiovascular examination 04/18/2013   Dyspnea on exertion 04/17/2013   Leg edema    Hyperlipidemia associated with type 2 diabetes mellitus (HCC)    HTN (hypertension), benign     PCP: Conni Elliot, NP  REFERRING PROVIDER: Estill Bamberg, MD  REFERRING DIAG: cervical spine stenosis, lumbar spinal stenosis and radiculopathy  THERAPY DIAG:  Abnormal posture  Stiffness of cervical spine  Weakness of both legs  Rationale for Evaluation and Treatment: Rehabilitation  ONSET DATE: chronic  SUBJECTIVE:                                                                                                                                                                                                         SUBJECTIVE STATEMENT: Patient reports continued neck pain, He reports that the STM and the stretching with a pillowcase are the most effective to help deal with pain. He plans to call the Dr to see about getting some neck injections.  Hand dominance: Right  PERTINENT HISTORY:  Chronic R LE radiculopathy, L5/S1 transforaminal steroid injection on R  on 12/10/22, with good relief of R LE radiculopathy.  cervical spine injections 2 weeks ago , didn't help as much as the lumbar  injection. Referred to PT by orthopedist to address both neck and back pain and stiffness  PAIN:  Are you having pain? Yes: NPRS scale: 4/10 Pain location: stiff , sore muscles in neck and B upper traps Pain description: stiffness primarily  Aggravating factors: no specific aggravating positions or movements Relieving factors:  Tylenol  PRECAUTIONS: None  RED FLAGS: None   WEIGHT BEARING RESTRICTIONS: No  FALLS:  Has patient fallen in last 6 months? No  LIVING ENVIRONMENT: Lives with: lives with their spouse Lives in: House/apartment Stairs: Yes: Internal: 11 steps; on left going up Has following equipment at home: Dan Humphreys - 2 wheeled and [employer] - 4 wheeled  OCCUPATION: retired   PLOF: Independent and Needs assistance with homemaking  PATIENT GOALS: improve stiffness, soreness neck and back  NEXT MD VISIT: 8 weeks  OBJECTIVE:   DIAGNOSTIC FINDINGS:  1. Severe multilevel degenerative changes with osseous fusion across nearly every lumbar vertebral body level. There is severe spinal canal stenosis at L1-L4 secondary to a combination congenitally small pedicles degenerative disc disease, and ligamentum flavum hypertrophy 2. Multilevel neural foraminal narrowing, severe bilaterally at L5-S1.    Electronically Signed   By: Lorenza Cambridge M.D.   On: 11/25/2022 14:57  IMPRESSION: 1. 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 2. Advanced foraminal impingement bilaterally at C3-4 to C6-7. 3. Advanced facet osteoarthritis on the right at C1-2 with marrow edema and right C2 impingement. 4. Ankylosis at C2-3, C3-4, and C5-6.     Electronically Signed   By: Tiburcio Pea M.D.   On: 11/19/2022 07:53  PATIENT SURVEYS:  NDI 21/50  COGNITION: Overall cognitive status: Within functional limits for tasks assessed  SENSATION: Light touch: Impaired  R lower leg and foot   POSTURE: flexed trunk , B hips flexed to 20 degrees with forward trunk lean, sloped , rounded B shoulders  PALPATION: Stiff, tender B upper traps, B levator scapulae Minimal jt mobility cervical spine for lateral glides and for cervical distraction   CERVICAL ROM:   Active ROM A/PROM (deg) eval  Flexion 40   Extension 25  Right lateral flexion   Left lateral flexion   Right rotation 30  Left rotation 0   (Blank rows = not tested) THORACIC SPINE ROM:   rotation 70 % B, FB 70 %, ext 20% LS ROM NT today UPPER EXTREMITY ROM:  Active ROM Right eval Left eval  Shoulder flexion 115   Shoulder extension    Shoulder abduction 110   Shoulder adduction    Shoulder extension    Shoulder internal rotation Behind back to t 11 Behind back to t 11  Shoulder external rotation    Elbow flexion    Elbow extension    Wrist flexion    Wrist extension    Wrist ulnar deviation    Wrist radial deviation    Wrist pronation    Wrist supination     (Blank rows = not tested) LOWER EXTREMITY ROM: B Hip flexion 95, IR 5 degrees, extension NT, B knees, ankles wnl UPPER EXTREMITY MMT:  MMT Right eval Left eval  Shoulder flexion    Shoulder extension    Shoulder abduction 3-   Shoulder adduction    Shoulder extension    Shoulder internal rotation    Shoulder external rotation 4   Middle trapezius    Lower trapezius    Elbow flexion 4-  Elbow extension    Wrist flexion    Wrist extension    Wrist ulnar deviation    Wrist radial deviation    Wrist pronation    Wrist supination    Grip strength     (Blank rows = wfl) LE MMT:  R ankle dorsiflexion gross strength B LE's 4/5 throughout  CERVICAL SPECIAL TESTS:  Spurling's test: Negative and Distraction test: Negative  FUNCTIONAL TESTS:  10 meter walk test: 8 sec or 0.8 m/sec  GAIT: decreased stance time on L, circumducts R LE in swing phase  TODAY'S TREATMENT:                                                                                                                              DATE: 03/03/23:therex: Bike 6 min Reviewed sitting position, ideal height of tv for viewing.  Reviewed nags and snags, patient performing frequently at home.  Manual techniques:  Supine for alternating manual stretching cervical spine and thoracic spine and  surrounding musculature, alternating upper traps, levator stretching with manual cervical traction Cervical flexion, with contract relax for rotation B, several sets Prolonged stretch upper traps combined with cervical flexion stretch   02/26/23 Bike L3 x 6 minutes UBE L1 x 2 forward and back Supine STM to base of skull, cervical paraspinals, UT with deep pressure and stretch. Self DTM using 2 tennis balls in a sock for base of skull to address tension headaches, then moved down to UT Seated with chin tuck to hold ball on wall-Shoulder ext, rows, ER, 2 x 10 reps each. Pect stretch on wall, 3 x 15 sec each side. Seated press ups to activate lats and stretch chest.  02/22/23 NuStep L5 x80mins  Chin tucks red band 2x10  4# UT stretch 30s STM and stretch rotations and side bending Rows and ext green 2x10  Manual distraction in supine   02/19/23 Bike L3 x 6 minutes STM and stretch with manual distraction for neck, patient reported that the distraction felt good Mechanical traction, 14# x 8 minutes Seated shoulder ext and rows with G tband, with chin tuck to hold ball on wall, 2 x 10 each.  02/17/23: Nustep level 5, 6 min, Ue's and LE's to improve tissue perfusion, for light mid range motion extremities and spine and improved joint nutrition Review and practiced previously instructed ex: Seated cervical rotation SNAGs, utilized pillow case, then used webbing strap to improve rotation Seated cervical extension with webbing strap for extension motion  Manual: supine for alternating between manual cervical traction, stretching of upper traps, levator scapulae, and suboccipital musculature, 8 to 10 x each. Also isometrics to engage levator B, and for cervical rotation, cervical occipitals, in bouts of 5 sec holds, 6 to 10 reps for each.  02/01/23 NuStep L5 x 6 minutes STM and stretch to posterior cervical muscles, head pull Chin tucks on towel roll x 10 Seated lower cerv/ up thoracic  stretch Seated lats, pushing into  mat behind hips Standing shoulder ext, rows, ER against G tband MH to neck for acute pain relief.  01/29/23 NuStep L5 x5mins  Pec stretch 30s x2  Standing against wall shoulder flexion with dowel 2x5  Rows and ext green 2x10  Cervical ext and rotations with towel x10  Horizontal abd red x10  PROM to neck and STM and stretching of upper traps MH for 5 mins   01/27/23:  PT evaluation, instructed in HEP as described below, to improve ease of achieving upright posture  PATIENT EDUCATION:  Education details: POC, goals  Person educated: Patient Education method: Explanation, Demonstration, Tactile cues, and Verbal cues Education comprehension: verbalized understanding, returned demonstration, and verbal cues required  HOME EXERCISE PROGRAM: Access Code: [email or web address] URL: [email or web address] Date: 01/27/2023 Prepared by: Antionne Enrique  Exercises - Supine Lower Trunk Rotation  - 1 x daily - 7 x weekly - 3 sets - 10 reps - Standing shoulder flexion wall slides  - 1 x daily - 7 x weekly - 3 sets - 10 reps - Doorway Pec Stretch at 90 Degrees Abduction  - 1 x daily - 7 x weekly - 3 sets - 10 reps - Hooklying Single Knee to Chest Stretch  - 1 x daily - 7 x weekly - 3 sets - 10 reps  ASSESSMENT:  CLINICAL IMPRESSION: Patient reports still very stiff, feels that he needs more injections neck with referring provider.  He is very restricted in cervical spine jts in all directions.  He has purchased a home pump traction  device, also utilizing bands at home.  He responded well to treatment interventions, but unfortunately does not get much lasting relief between sessions.  OBJECTIVE IMPAIRMENTS: Abnormal gait, decreased balance, difficulty walking, decreased ROM, decreased strength, impaired flexibility, impaired sensation, postural dysfunction, and pain.   ACTIVITY LIMITATIONS: carrying, lifting, squatting, stairs, transfers, and locomotion  level  PARTICIPATION LIMITATIONS: community activity, occupation, and yard work  PERSONAL FACTORS: Age, Behavior pattern, Fitness, Past/current experiences, Time since onset of injury/illness/exacerbation, and 3+ comorbidities: Dm, restrictive lung disease, cervical spinal stenosis, lumbar stenosis and radiculopathy  are also affecting patient's functional outcome.   REHAB POTENTIAL: Good  CLINICAL DECISION MAKING: Evolving/moderate complexity  EVALUATION COMPLEXITY: Moderate   GOALS: Goals reviewed with patient? Yes  SHORT TERM GOALS: Target date: 2 weeks 02/10/23  I HEP Baseline: initiated today Goal status: 02/19/23-Program initiated, ongoing   LONG TERM GOALS: Target date: 04/21/23  Improve gait speed to greater than 1 m/sec for improved community ambulation Baseline: 0.8 m/sec Goal status: INITIAL  2.  NDI improve from 21/50 to 11/50 Baseline:  Goal status: INITIAL  3.  Improve B LE strength to 5/5 with MMT except dorsiflexion R ankle Baseline: 4/5 gross Goal status: INITIAL  PLAN:  PT FREQUENCY: 1-2x/week  PT DURATION: 8 weeks  PLANNED INTERVENTIONS: Therapeutic exercises, Therapeutic activity, Neuromuscular re-education, Balance training, Gait training, Patient/Family education, Self Care, and Joint mobilization  PLAN FOR NEXT SESSION: periscapular strengthening, dry needling, manual techniques as indicated  Navjot Loera, PT, DPT, OCS 03/03/23 5:33 PM

## 2023-03-08 ENCOUNTER — Encounter: Payer: Self-pay | Admitting: Physical Therapy

## 2023-03-08 ENCOUNTER — Ambulatory Visit: Payer: Medicare Other | Admitting: Physical Therapy

## 2023-03-08 DIAGNOSIS — M436 Torticollis: Secondary | ICD-10-CM | POA: Diagnosis not present

## 2023-03-08 DIAGNOSIS — R29898 Other symptoms and signs involving the musculoskeletal system: Secondary | ICD-10-CM | POA: Diagnosis not present

## 2023-03-08 DIAGNOSIS — R293 Abnormal posture: Secondary | ICD-10-CM | POA: Diagnosis not present

## 2023-03-08 NOTE — Therapy (Signed)
OUTPATIENT PHYSICAL THERAPY CERVICAL TREATMENT   Patient Name: Andres Taylor MRN: [id number] DOB:Nov 06, 1945, 77 y.o., male Today's Date: 03/08/2023  END OF SESSION:  Past Medical History:  Diagnosis Date   Abdominal bloating 03/13/2019   Allergic rhinitis    Allergy    Arthritis    Asthma    Blood in urine    Cataract    forming    Chronic cough    CKD (chronic kidney disease), stage II    DDD (degenerative disc disease)    Diabetes mellitus (HCC)    DOE (dyspnea on exertion)    Eustachian tube dysfunction    Fatty liver    GERD (gastroesophageal reflux disease)    Glaucoma    Gout    Hard of hearing    bilateral    Heme positive stool 03/13/2019   History of cardiomegaly 05/16/2019   Noted on CXR   History of kidney stones    Hyperlipidemia    Hypertension    Inguinal hernia    Bilateral   Joint pain, knee    Leg edema    Lung tumor (benign), right    Right upper lobe lung mass   Obesity    OSA on CPAP    Pneumonitis    right lung   Pulmonary HTN (HCC) 04/18/2013   Restrictive lung disease    Sciatica    Spinal stenosis    Wears contact lenses    Wears glasses    Wears hearing aid in both ears    Past Surgical History:  Procedure Laterality Date   APPENDECTOMY  1976   cataract surgery Bilateral    march and april 2023   COLONOSCOPY  08/2010   COLONOSCOPY  08/07/2019   HEMORRHOID SURGERY  2001   INGUINAL HERNIA REPAIR Left 1948   as a baby 3 months old   INGUINAL HERNIA REPAIR Bilateral 09/21/2019   Procedure: LAPAROSCOPIC BILATERAL INGUINAL HERNIA REPAIR, LYSIS OF ADHESIONS;  Surgeon: Karie Soda, MD;  Location: [facility];  Service: General;  Laterality: Bilateral;   LEFT AND RIGHT HEART CATHETERIZATION WITH CORONARY ANGIOGRAM N/A 04/20/2013   Procedure: LEFT AND RIGHT HEART CATHETERIZATION WITH CORONARY ANGIOGRAM;  Surgeon: Marykay Lex, MD;  Location: [facility] CATH LAB;  Angiographically normal Coronary Arteries - L Dom Cx  (OM1, small LPL1 then LPL2 & PDA) & wraparound LAD (D1-3 small).  Borderline CO/CI - 4.94/2.16 by both Fick & TD.  RAP ~14 mmHg, RVEDP 15 mmHg.  LVEDP 19 mmHg (->diastolic dysfunction) PCWP 16 mmHg, but PA Mean 25 mmHg   LITHOTRIPSY     x 2   LOBECTOMY Right    lumbar disectomy L4-5  1980   NM MYOVIEW LTD  06/15/2019    EF 70%.  No ischemia or infarction.  LOW RISK    TRANSTHORACIC ECHOCARDIOGRAM  04/21/2019   EF 60 to 65%.  Mild LVH.  Normal RV.  Normal atrial size.  Mild aortic sclerosis.  No stenosis. Lipomatous intra-atrial septum   UPPER GI ENDOSCOPY  08/07/2019   VIDEO ASSISTED THORACOSCOPY (VATS)/WEDGE RESECTION Right 08/30/2013   Procedure: VIDEO ASSISTED THORACOSCOPY (VATS)/WEDGE RESECTION;  Surgeon: Loreli Slot, MD;  Location: [facility] OR;  Service: Thoracic;  Laterality: Right;   Patient Active Problem List   Diagnosis Date Noted   Chronic right-sided low back pain with right-sided sciatica 11/20/2022   Epigastric pain 04/27/2022   Aortic atherosclerosis (HCC) 10/07/2021   Allergic rhinitis 10/02/2021   Difficult airway for intubation 09/21/2019  Bilateral inguinal hernia (BIH) 09/21/2019   Asthma 05/25/2019   Generalized abdominal pain 03/13/2019   Chronic cough 09/08/2018   Spinal stenosis in cervical region 06/07/2018   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   CKD (chronic kidney disease) stage 2, GFR 60-89 ml/min 05/17/2018   OSA on CPAP 05/17/2018   Restrictive lung disease 05/17/2018   Cervical radiculopathy 05/17/2018   Diabetic neuropathy associated with type 2 diabetes mellitus (HCC) 05/17/2018   Hyperuricemia 05/17/2018   Dermatochalasis of both upper eyelids 02/25/2017   Acute angle-closure glaucoma of both eyes 12/04/2016   DM (diabetes mellitus) (HCC) 12/01/2016   Hyperopia with astigmatism and presbyopia, bilateral 12/01/2016   Nuclear sclerotic cataract of left eye 12/01/2016   Posterior subcapsular age-related  cataract of both eyes 12/01/2016   Vitreous floater, bilateral 12/01/2016   Scarring of lung 08/30/2013   Microvascular angina 04/18/2013   Pre-operative cardiovascular examination 04/18/2013   Dyspnea on exertion 04/17/2013   Leg edema    Hyperlipidemia associated with type 2 diabetes mellitus (HCC)    HTN (hypertension), benign     PCP: Conni Elliot, NP  REFERRING PROVIDER: Estill Bamberg, MD  REFERRING DIAG: cervical spine stenosis, lumbar spinal stenosis and radiculopathy  THERAPY DIAG:  No diagnosis found.  Rationale for Evaluation and Treatment: Rehabilitation  ONSET DATE: chronic  SUBJECTIVE:                                                                                                                                                                                                         SUBJECTIVE STATEMENT: Patient reports that he is scheduled for  injections in his neck in early Sept (4th?)  Hand dominance: Right  PERTINENT HISTORY:  Chronic R LE radiculopathy, L5/S1 transforaminal steroid injection on R  on 12/10/22, with good relief of R LE radiculopathy.  cervical spine injections 2 weeks ago , didn't help as much as the lumbar injection. Referred to PT by orthopedist to address both neck and back pain and stiffness  PAIN:  Are you having pain? Yes: NPRS scale: 4/10 Pain location: stiff , sore muscles in neck and B upper traps Pain description: stiffness primarily  Aggravating factors: no specific aggravating positions or movements Relieving factors: Tylenol  PRECAUTIONS: None  RED FLAGS: None   WEIGHT BEARING RESTRICTIONS: No  FALLS:  Has patient fallen in last 6 months? No  LIVING ENVIRONMENT: Lives with: lives with their spouse Lives in: House/apartment Stairs: Yes: Internal: 11 steps; on left going up Has following equipment at home: Dan Humphreys -  2 wheeled and Walker - 4 wheeled  OCCUPATION: retired   PLOF: Independent and Needs assistance  with homemaking  PATIENT GOALS: improve stiffness, soreness neck and back  NEXT MD VISIT: 8 weeks  OBJECTIVE:   DIAGNOSTIC FINDINGS:  1. Severe multilevel degenerative changes with osseous fusion across nearly every lumbar vertebral body level. There is severe spinal canal stenosis at L1-L4 secondary to a combination congenitally small pedicles degenerative disc disease, and ligamentum flavum hypertrophy 2. Multilevel neural foraminal narrowing, severe bilaterally at L5-S1.    Electronically Signed   By: Lorenza Cambridge M.D.   On: 11/25/2022 14:57  IMPRESSION: 1. 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 2. Advanced foraminal impingement bilaterally at C3-4 to C6-7. 3. Advanced facet osteoarthritis on the right at C1-2 with marrow edema and right C2 impingement. 4. Ankylosis at C2-3, C3-4, and C5-6.     Electronically Signed   By: Tiburcio Pea M.D.   On: 11/19/2022 07:53  PATIENT SURVEYS:  NDI 21/50  COGNITION: Overall cognitive status: Within functional limits for tasks assessed  SENSATION: Light touch: Impaired  R lower leg and foot   POSTURE: flexed trunk , B hips flexed to 20 degrees with forward trunk lean, sloped , rounded B shoulders  PALPATION: Stiff, tender B upper traps, B levator scapulae Minimal jt mobility cervical spine for lateral glides and for cervical distraction   CERVICAL ROM:   Active ROM A/PROM (deg) eval  Flexion 40  Extension 25  Right lateral flexion   Left lateral flexion   Right rotation 30  Left rotation 0   (Blank rows = not tested) THORACIC SPINE ROM:   rotation 70 % B, FB 70 %, ext 20% LS ROM NT today UPPER EXTREMITY ROM:  Active ROM Right eval Left eval  Shoulder flexion 115   Shoulder extension    Shoulder abduction 110   Shoulder adduction    Shoulder extension    Shoulder internal rotation Behind  back to t 11 Behind back to t 11  Shoulder external rotation    Elbow flexion    Elbow extension    Wrist flexion    Wrist extension    Wrist ulnar deviation    Wrist radial deviation    Wrist pronation    Wrist supination     (Blank rows = not tested) LOWER EXTREMITY ROM: B Hip flexion 95, IR 5 degrees, extension NT, B knees, ankles wnl UPPER EXTREMITY MMT:  MMT Right eval Left eval  Shoulder flexion    Shoulder extension    Shoulder abduction 3-   Shoulder adduction    Shoulder extension    Shoulder internal rotation    Shoulder external rotation 4   Middle trapezius    Lower trapezius    Elbow flexion 4-   Elbow extension    Wrist flexion    Wrist extension    Wrist ulnar deviation    Wrist radial deviation    Wrist pronation    Wrist supination    Grip strength     (Blank rows = wfl) LE MMT:  R ankle dorsiflexion gross strength B LE's 4/5 throughout  CERVICAL SPECIAL TESTS:  Spurling's test: Negative and Distraction test: Negative  FUNCTIONAL TESTS:  10 meter walk test: 8 sec or 0.8 m/sec  GAIT: decreased stance time on L, circumducts R LE in swing phase  TODAY'S TREATMENT:  DATE: 03/08/23 NuStep L5 x 6 minutes Standing with back to wall, chin tuck to hold ball on wall while performing chest stretch, trying to bring elbows to wall with abd/add STM to cervical paraspinals, scalenes, UT with passive stretch to same, cervical flexion ROM and cervical distraction MH and Interferential e-stim to neck x 10 minutes to assess for possible TNS use for pain relief.  03/03/23:therex: Bike 6 min Reviewed sitting position, ideal height of tv for viewing.  Reviewed nags and snags, patient performing frequently at home.  Manual techniques:  Supine for alternating manual stretching cervical spine and thoracic spine and surrounding musculature,  alternating upper traps, levator stretching with manual cervical traction Cervical flexion, with contract relax for rotation B, several sets Prolonged stretch upper traps combined with cervical flexion stretch  02/26/23 Bike L3 x 6 minutes UBE L1 x 2 forward and back Supine STM to base of skull, cervical paraspinals, UT with deep pressure and stretch. Self DTM using 2 tennis balls in a sock for base of skull to address tension headaches, then moved down to UT Seated with chin tuck to hold ball on wall-Shoulder ext, rows, ER, 2 x 10 reps each. Pect stretch on wall, 3 x 15 sec each side. Seated press ups to activate lats and stretch chest.  02/22/23 NuStep L5 x64mins  Chin tucks red band 2x10  4# UT stretch 30s STM and stretch rotations and side bending Rows and ext green 2x10  Manual distraction in supine   02/19/23 Bike L3 x 6 minutes STM and stretch with manual distraction for neck, patient reported that the distraction felt good Mechanical traction, 14# x 8 minutes Seated shoulder ext and rows with G tband, with chin tuck to hold ball on wall, 2 x 10 each.  02/17/23: Nustep level 5, 6 min, Ue's and LE's to improve tissue perfusion, for light mid range motion extremities and spine and improved joint nutrition Review and practiced previously instructed ex: Seated cervical rotation SNAGs, utilized pillow case, then used webbing strap to improve rotation Seated cervical extension with webbing strap for extension motion  Manual: supine for alternating between manual cervical traction, stretching of upper traps, levator scapulae, and suboccipital musculature, 8 to 10 x each. Also isometrics to engage levator B, and for cervical rotation, cervical occipitals, in bouts of 5 sec holds, 6 to 10 reps for each.  02/01/23 NuStep L5 x 6 minutes STM and stretch to posterior cervical muscles, head pull Chin tucks on towel roll x 10 Seated lower cerv/ up thoracic stretch Seated lats, pushing into  mat behind hips Standing shoulder ext, rows, ER against G tband MH to neck for acute pain relief.  01/29/23 NuStep L5 x68mins  Pec stretch 30s x2  Standing against wall shoulder flexion with dowel 2x5  Rows and ext green 2x10  Cervical ext and rotations with towel x10  Horizontal abd red x10  PROM to neck and STM and stretching of upper traps MH for 5 mins   01/27/23:  PT evaluation, instructed in HEP as described below, to improve ease of achieving upright posture  PATIENT EDUCATION:  Education details: POC, goals  Person educated: Patient Education method: Explanation, Demonstration, Tactile cues, and Verbal cues Education comprehension: verbalized understanding, returned demonstration, and verbal cues required  HOME EXERCISE PROGRAM: Access Code: [email or web address] URL: [email or web address] Date: 01/27/2023 Prepared by: Amy Speaks  Exercises - Supine Lower Trunk Rotation  - 1 x daily - 7 x weekly - 3 sets - 10 reps - Standing  shoulder flexion wall slides  - 1 x daily - 7 x weekly - 3 sets - 10 reps - Doorway Pec Stretch at 90 Degrees Abduction  - 1 x daily - 7 x weekly - 3 sets - 10 reps - Hooklying Single Knee to Chest Stretch  - 1 x daily - 7 x weekly - 3 sets - 10 reps  ASSESSMENT:  CLINICAL IMPRESSION: Patient reports continued stiffness. The muscle relaxers help, allow him to perform his neck stretching which provides temporary relief. He feels the exercises and the mechanical traction also do help, everything is temporary. Educated him to rationale of managing his pain since we cannot change his physical degeneration in spinal column. He agrees, open to any ideas. Attempted e-stim at end of treatment to assess for possible TNS.  OBJECTIVE IMPAIRMENTS: Abnormal gait, decreased balance, difficulty walking, decreased ROM, decreased strength, impaired flexibility, impaired sensation, postural dysfunction, and pain.   ACTIVITY LIMITATIONS: carrying, lifting, squatting,  stairs, transfers, and locomotion level  PARTICIPATION LIMITATIONS: community activity, occupation, and yard work  PERSONAL FACTORS: Age, Behavior pattern, Fitness, Past/current experiences, Time since onset of injury/illness/exacerbation, and 3+ comorbidities: Dm, restrictive lung disease, cervical spinal stenosis, lumbar stenosis and radiculopathy  are also affecting patient's functional outcome.   REHAB POTENTIAL: Good  CLINICAL DECISION MAKING: Evolving/moderate complexity  EVALUATION COMPLEXITY: Moderate   GOALS: Goals reviewed with patient? Yes  SHORT TERM GOALS: Target date: 2 weeks 02/10/23  I HEP Baseline: initiated today Goal status: 02/19/23-Program initiated, ongoing   LONG TERM GOALS: Target date: 04/21/23  Improve gait speed to greater than 1 m/sec for improved community ambulation Baseline: 0.8 m/sec Goal status: INITIAL  2.  NDI improve from 21/50 to 11/50 Baseline:  Goal status: INITIAL  3.  Improve B LE strength to 5/5 with MMT except dorsiflexion R ankle Baseline: 4/5 gross Goal status: INITIAL  PLAN:  PT FREQUENCY: 1-2x/week  PT DURATION: 8 weeks  PLANNED INTERVENTIONS: Therapeutic exercises, Therapeutic activity, Neuromuscular re-education, Balance training, Gait training, Patient/Family education, Self Care, and Joint mobilization  PLAN FOR NEXT SESSION: periscapular strengthening, dry needling, manual techniques as indicated  Oley Balm DPT 03/08/23  03/08/23 11:41 AM

## 2023-03-09 NOTE — Therapy (Signed)
OUTPATIENT PHYSICAL THERAPY CERVICAL TREATMENT Progress Note Reporting Period 01/27/23 to 03/10/23  See note below for Objective Data and Assessment of Progress/Goals.      Patient Name: Andres Taylor MRN: [id number] DOB:[birth year], 77 y.o., male Today's Date: 03/10/2023  END OF SESSION:  PT End of Session - 03/10/23 0929     Visit Number 10    Date for PT Re-Evaluation 04/21/23    PT Start Time 0930    PT Stop Time 1015    PT Time Calculation (min) 45 min    Activity Tolerance Patient tolerated treatment well    Behavior During Therapy [facility] for tasks assessed/performed            Past Medical History:  Diagnosis Date   Abdominal bloating 03/13/2019   Allergic rhinitis    Allergy    Arthritis    Asthma    Blood in urine    Cataract    forming    Chronic cough    CKD (chronic kidney disease), stage II    DDD (degenerative disc disease)    Diabetes mellitus (HCC)    DOE (dyspnea on exertion)    Eustachian tube dysfunction    Fatty liver    GERD (gastroesophageal reflux disease)    Glaucoma    Gout    Hard of hearing    bilateral    Heme positive stool 03/13/2019   History of cardiomegaly 05/16/2019   Noted on CXR   History of kidney stones    Hyperlipidemia    Hypertension    Inguinal hernia    Bilateral   Joint pain, knee    Leg edema    Lung tumor (benign), right    Right upper lobe lung mass   Obesity    OSA on CPAP    Pneumonitis    right lung   Pulmonary HTN (HCC) 04/18/2013   Restrictive lung disease    Sciatica    Spinal stenosis    Wears contact lenses    Wears glasses    Wears hearing aid in both ears    Past Surgical History:  Procedure Laterality Date   APPENDECTOMY  1976   cataract surgery Bilateral    march and april 2023   COLONOSCOPY  08/2010   COLONOSCOPY  08/07/2019   HEMORRHOID SURGERY  2001   INGUINAL HERNIA REPAIR Left 1948   as a baby 45 months old   INGUINAL HERNIA REPAIR Bilateral 09/21/2019   Procedure:  LAPAROSCOPIC BILATERAL INGUINAL HERNIA REPAIR, LYSIS OF ADHESIONS;  Surgeon: Karie Soda, MD;  Location: [facility];  Service: General;  Laterality: Bilateral;   LEFT AND RIGHT HEART CATHETERIZATION WITH CORONARY ANGIOGRAM N/A 04/20/2013   Procedure: LEFT AND RIGHT HEART CATHETERIZATION WITH CORONARY ANGIOGRAM;  Surgeon: Marykay Lex, MD;  Location: [facility] CATH LAB;  Angiographically normal Coronary Arteries - L Dom Cx (OM1, small LPL1 then LPL2 & PDA) & wraparound LAD (D1-3 small).  Borderline CO/CI - 4.94/2.16 by both Fick & TD.  RAP ~14 mmHg, RVEDP 15 mmHg.  LVEDP 19 mmHg (->diastolic dysfunction) PCWP 16 mmHg, but PA Mean 25 mmHg   LITHOTRIPSY     x 2   LOBECTOMY Right    lumbar disectomy L4-5  1980   NM MYOVIEW LTD  06/15/2019    EF 70%.  No ischemia or infarction.  LOW RISK    TRANSTHORACIC ECHOCARDIOGRAM  04/21/2019   EF 60 to 65%.  Mild LVH.  Normal RV.  Normal atrial  size.  Mild aortic sclerosis.  No stenosis. Lipomatous intra-atrial septum   UPPER GI ENDOSCOPY  08/07/2019   VIDEO ASSISTED THORACOSCOPY (VATS)/WEDGE RESECTION Right 08/30/2013   Procedure: VIDEO ASSISTED THORACOSCOPY (VATS)/WEDGE RESECTION;  Surgeon: Loreli Slot, MD;  Location: [facility] OR;  Service: Thoracic;  Laterality: Right;   Patient Active Problem List   Diagnosis Date Noted   Chronic right-sided low back pain with right-sided sciatica 11/20/2022   Epigastric pain 04/27/2022   Aortic atherosclerosis (HCC) 10/07/2021   Allergic rhinitis 10/02/2021   Difficult airway for intubation 09/21/2019   Bilateral inguinal hernia (BIH) 09/21/2019   Asthma 05/25/2019   Generalized abdominal pain 03/13/2019   Chronic cough 09/08/2018   Spinal stenosis in cervical region 06/07/2018   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   CKD (chronic kidney disease) stage 2, GFR 60-89 ml/min 05/17/2018   OSA on CPAP 05/17/2018   Restrictive lung disease 05/17/2018   Cervical  radiculopathy 05/17/2018   Diabetic neuropathy associated with type 2 diabetes mellitus (HCC) 05/17/2018   Hyperuricemia 05/17/2018   Dermatochalasis of both upper eyelids 02/25/2017   Acute angle-closure glaucoma of both eyes 12/04/2016   DM (diabetes mellitus) (HCC) 12/01/2016   Hyperopia with astigmatism and presbyopia, bilateral 12/01/2016   Nuclear sclerotic cataract of left eye 12/01/2016   Posterior subcapsular age-related cataract of both eyes 12/01/2016   Vitreous floater, bilateral 12/01/2016   Scarring of lung 08/30/2013   Microvascular angina 04/18/2013   Pre-operative cardiovascular examination 04/18/2013   Dyspnea on exertion 04/17/2013   Leg edema    Hyperlipidemia associated with type 2 diabetes mellitus (HCC)    HTN (hypertension), benign     PCP: Conni Elliot, NP  REFERRING PROVIDER: Estill Bamberg, MD  REFERRING DIAG: cervical spine stenosis, lumbar spinal stenosis and radiculopathy  THERAPY DIAG:  Abnormal posture  Stiffness of cervical spine  Weakness of both legs  Rationale for Evaluation and Treatment: Rehabilitation  ONSET DATE: chronic  SUBJECTIVE:                                                                                                                                                                                                         SUBJECTIVE STATEMENT: Feeling better today than last time, have a shot scheduled for next Wednesday. I feel better when I leave here, especially after doing the TENS unit but it does not last that long.   Hand dominance: Right  PERTINENT HISTORY:  Chronic R LE radiculopathy, L5/S1 transforaminal steroid injection on R  on 12/10/22, with good relief  of R LE radiculopathy.  cervical spine injections 2 weeks ago , didn't help as much as the lumbar injection. Referred to PT by orthopedist to address both neck and back pain and stiffness  PAIN:  Are you having pain? Yes: NPRS scale: 4/10 Pain location:  stiff , sore muscles in neck and B upper traps Pain description: stiffness primarily  Aggravating factors: no specific aggravating positions or movements Relieving factors: Tylenol  PRECAUTIONS: None  RED FLAGS: None   WEIGHT BEARING RESTRICTIONS: No  FALLS:  Has patient fallen in last 6 months? No  LIVING ENVIRONMENT: Lives with: lives with their spouse Lives in: House/apartment Stairs: Yes: Internal: 11 steps; on left going up Has following equipment at home: Dan Humphreys - 2 wheeled and [employer] - 4 wheeled  OCCUPATION: retired   PLOF: Independent and Needs assistance with homemaking  PATIENT GOALS: improve stiffness, soreness neck and back  NEXT MD VISIT: 8 weeks  OBJECTIVE:   DIAGNOSTIC FINDINGS:  1. Severe multilevel degenerative changes with osseous fusion across nearly every lumbar vertebral body level. There is severe spinal canal stenosis at L1-L4 secondary to a combination congenitally small pedicles degenerative disc disease, and ligamentum flavum hypertrophy 2. Multilevel neural foraminal narrowing, severe bilaterally at L5-S1.    Electronically Signed   By: Lorenza Cambridge M.D.   On: 11/25/2022 14:57  IMPRESSION: 1. 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 2. Advanced foraminal impingement bilaterally at C3-4 to C6-7. 3. Advanced facet osteoarthritis on the right at C1-2 with marrow edema and right C2 impingement. 4. Ankylosis at C2-3, C3-4, and C5-6.     Electronically Signed   By: Tiburcio Pea M.D.   On: 11/19/2022 07:53  PATIENT SURVEYS:  NDI 21/50  COGNITION: Overall cognitive status: Within functional limits for tasks assessed  SENSATION: Light touch: Impaired  R lower leg and foot   POSTURE: flexed trunk , B hips flexed to 20 degrees with forward trunk lean, sloped , rounded B shoulders  PALPATION: Stiff, tender B upper  traps, B levator scapulae Minimal jt mobility cervical spine for lateral glides and for cervical distraction   CERVICAL ROM:   Active ROM A/PROM (deg) eval  Flexion 40  Extension 25  Right lateral flexion   Left lateral flexion   Right rotation 30  Left rotation 0   (Blank rows = not tested) THORACIC SPINE ROM:   rotation 70 % B, FB 70 %, ext 20% LS ROM NT today UPPER EXTREMITY ROM:  Active ROM Right eval Left eval  Shoulder flexion 115   Shoulder extension    Shoulder abduction 110   Shoulder adduction    Shoulder extension    Shoulder internal rotation Behind back to t 11 Behind back to t 11  Shoulder external rotation    Elbow flexion    Elbow extension    Wrist flexion    Wrist extension    Wrist ulnar deviation    Wrist radial deviation    Wrist pronation    Wrist supination     (Blank rows = not tested) LOWER EXTREMITY ROM: B Hip flexion 95, IR 5 degrees, extension NT, B knees, ankles wnl UPPER EXTREMITY MMT:  MMT Right eval Left eval  Shoulder flexion    Shoulder extension    Shoulder abduction 3-   Shoulder adduction    Shoulder extension    Shoulder internal rotation    Shoulder external  rotation 4   Middle trapezius    Lower trapezius    Elbow flexion 4-   Elbow extension    Wrist flexion    Wrist extension    Wrist ulnar deviation    Wrist radial deviation    Wrist pronation    Wrist supination    Grip strength     (Blank rows = wfl) LE MMT:  R ankle dorsiflexion gross strength B LE's 4/5 throughout  CERVICAL SPECIAL TESTS:  Spurling's test: Negative and Distraction test: Negative  FUNCTIONAL TESTS:  10 meter walk test: 8 sec or 0.8 m/sec  GAIT: decreased stance time on L, circumducts R LE in swing phase  TODAY'S TREATMENT:                                                                                                                              DATE: 03/10/23 NuStep L5 x11mins  Recheck goals Rows and ext green 2x10 Horizontal  abd green 2x10  Pec stretch in doorway  W backs against wall with OP x10  Ankle DF green 20 reps  STM to cervical paraspinals, scalenes, UT with passive stretch to same, cervical flexion ROM and cervical distraction   03/08/23 NuStep L5 x 6 minutes Standing with back to wall, chin tuck to hold ball on wall while performing chest stretch, trying to bring elbows to wall with abd/add STM to cervical paraspinals, scalenes, UT with passive stretch to same, cervical flexion ROM and cervical distraction MH and Interferential e-stim to neck x 10 minutes to assess for possible TNS use for pain relief.  03/03/23:therex: Bike 6 min Reviewed sitting position, ideal height of tv for viewing.  Reviewed nags and snags, patient performing frequently at home.  Manual techniques:  Supine for alternating manual stretching cervical spine and thoracic spine and surrounding musculature, alternating upper traps, levator stretching with manual cervical traction Cervical flexion, with contract relax for rotation B, several sets Prolonged stretch upper traps combined with cervical flexion stretch  02/26/23 Bike L3 x 6 minutes UBE L1 x 2 forward and back Supine STM to base of skull, cervical paraspinals, UT with deep pressure and stretch. Self DTM using 2 tennis balls in a sock for base of skull to address tension headaches, then moved down to UT Seated with chin tuck to hold ball on wall-Shoulder ext, rows, ER, 2 x 10 reps each. Pect stretch on wall, 3 x 15 sec each side. Seated press ups to activate lats and stretch chest.  02/22/23 NuStep L5 x49mins  Chin tucks red band 2x10  4# UT stretch 30s STM and stretch rotations and side bending Rows and ext green 2x10  Manual distraction in supine   02/19/23 Bike L3 x 6 minutes STM and stretch with manual distraction for neck, patient reported that the distraction felt good Mechanical traction, 14# x 8 minutes Seated shoulder ext and rows with G tband, with chin  tuck to hold ball on wall,  2 x 10 each.  02/17/23: Nustep level 5, 6 min, Ue's and LE's to improve tissue perfusion, for light mid range motion extremities and spine and improved joint nutrition Review and practiced previously instructed ex: Seated cervical rotation SNAGs, utilized pillow case, then used webbing strap to improve rotation Seated cervical extension with webbing strap for extension motion  Manual: supine for alternating between manual cervical traction, stretching of upper traps, levator scapulae, and suboccipital musculature, 8 to 10 x each. Also isometrics to engage levator B, and for cervical rotation, cervical occipitals, in bouts of 5 sec holds, 6 to 10 reps for each.  02/01/23 NuStep L5 x 6 minutes STM and stretch to posterior cervical muscles, head pull Chin tucks on towel roll x 10 Seated lower cerv/ up thoracic stretch Seated lats, pushing into mat behind hips Standing shoulder ext, rows, ER against G tband MH to neck for acute pain relief.  01/29/23 NuStep L5 x81mins  Pec stretch 30s x2  Standing against wall shoulder flexion with dowel 2x5  Rows and ext green 2x10  Cervical ext and rotations with towel x10  Horizontal abd red x10  PROM to neck and STM and stretching of upper traps MH for 5 mins   01/27/23:  PT evaluation, instructed in HEP as described below, to improve ease of achieving upright posture  PATIENT EDUCATION:  Education details: POC, goals  Person educated: Patient Education method: Explanation, Demonstration, Tactile cues, and Verbal cues Education comprehension: verbalized understanding, returned demonstration, and verbal cues required  HOME EXERCISE PROGRAM: Access Code: [email or web address] URL: [email or web address] Date: 01/27/2023 Prepared by: Amy Speaks  Exercises - Supine Lower Trunk Rotation  - 1 x daily - 7 x weekly - 3 sets - 10 reps - Standing shoulder flexion wall slides  - 1 x daily - 7 x weekly - 3 sets - 10 reps -  Doorway Pec Stretch at 90 Degrees Abduction  - 1 x daily - 7 x weekly - 3 sets - 10 reps - Hooklying Single Knee to Chest Stretch  - 1 x daily - 7 x weekly - 3 sets - 10 reps  ASSESSMENT:  CLINICAL IMPRESSION: Patient reports continued stiffness and pain in his neck. Completed progress note, he has made some improvements towards meeting his LTGs.  He feels the exercises and the mechanical traction also do help, everything is temporary. He has some tightness and TP in his upper traps, I think we can try some dry needling to see if this might help.   OBJECTIVE IMPAIRMENTS: Abnormal gait, decreased balance, difficulty walking, decreased ROM, decreased strength, impaired flexibility, impaired sensation, postural dysfunction, and pain.   ACTIVITY LIMITATIONS: carrying, lifting, squatting, stairs, transfers, and locomotion level  PARTICIPATION LIMITATIONS: community activity, occupation, and yard work  PERSONAL FACTORS: Age, Behavior pattern, Fitness, Past/current experiences, Time since onset of injury/illness/exacerbation, and 3+ comorbidities: Dm, restrictive lung disease, cervical spinal stenosis, lumbar stenosis and radiculopathy  are also affecting patient's functional outcome.   REHAB POTENTIAL: Good  CLINICAL DECISION MAKING: Evolving/moderate complexity  EVALUATION COMPLEXITY: Moderate   GOALS: Goals reviewed with patient? Yes  SHORT TERM GOALS: Target date: 2 weeks 02/10/23  I HEP Baseline: initiated today Goal status: MET 03/10/23, 02/19/23-Program initiated, ongoing   LONG TERM GOALS: Target date: 04/21/23  Improve gait speed to greater than 1 m/sec for improved community ambulation Baseline: 0.8 m/sec Goal status: ongoing 0.9 m/sec  2.  NDI improve from 21/50 to 11/50 Baseline:  Goal status: ongoing 14/50 03/10/23  3.  Improve B LE strength to 5/5 with MMT except dorsiflexion R ankle Baseline: 4/5 gross Goal status: MET 03/10/23  PLAN:  PT FREQUENCY: 1-2x/week  PT  DURATION: 8 weeks  PLANNED INTERVENTIONS: Therapeutic exercises, Therapeutic activity, Neuromuscular re-education, Balance training, Gait training, Patient/Family education, Self Care, and Joint mobilization  PLAN FOR NEXT SESSION: periscapular strengthening, dry needling, manual techniques as indicated  Cassie Freer, DPT 03/10/23  03/10/23 10:14 AM

## 2023-03-10 ENCOUNTER — Ambulatory Visit: Payer: Medicare Other

## 2023-03-10 DIAGNOSIS — R293 Abnormal posture: Secondary | ICD-10-CM | POA: Diagnosis not present

## 2023-03-10 DIAGNOSIS — R29898 Other symptoms and signs involving the musculoskeletal system: Secondary | ICD-10-CM

## 2023-03-10 DIAGNOSIS — M436 Torticollis: Secondary | ICD-10-CM

## 2023-03-17 ENCOUNTER — Other Ambulatory Visit: Payer: Self-pay | Admitting: Nurse Practitioner

## 2023-03-17 DIAGNOSIS — M47812 Spondylosis without myelopathy or radiculopathy, cervical region: Secondary | ICD-10-CM | POA: Diagnosis not present

## 2023-03-17 DIAGNOSIS — R053 Chronic cough: Secondary | ICD-10-CM

## 2023-03-23 DIAGNOSIS — M25561 Pain in right knee: Secondary | ICD-10-CM | POA: Diagnosis not present

## 2023-03-23 DIAGNOSIS — M67912 Unspecified disorder of synovium and tendon, left shoulder: Secondary | ICD-10-CM | POA: Diagnosis not present

## 2023-03-24 ENCOUNTER — Ambulatory Visit: Payer: Medicare Other | Attending: Orthopedic Surgery

## 2023-03-24 ENCOUNTER — Other Ambulatory Visit: Payer: Self-pay

## 2023-03-24 DIAGNOSIS — M436 Torticollis: Secondary | ICD-10-CM | POA: Insufficient documentation

## 2023-03-24 DIAGNOSIS — R29898 Other symptoms and signs involving the musculoskeletal system: Secondary | ICD-10-CM | POA: Diagnosis not present

## 2023-03-24 DIAGNOSIS — R293 Abnormal posture: Secondary | ICD-10-CM | POA: Diagnosis not present

## 2023-03-24 NOTE — Therapy (Signed)
OUTPATIENT PHYSICAL THERAPY CERVICAL TREATMENT    Patient Name: Andres Taylor MRN: [id number] DOB:1946/03/04, 77 y.o., male Today's Date: 03/24/2023  END OF SESSION:  PT End of Session - 03/24/23 1433     Visit Number 11    Date for PT Re-Evaluation 04/21/23    Progress Note Due on Visit 20    PT Start Time 1433    PT Stop Time 1515    PT Time Calculation (min) 42 min    Activity Tolerance Patient tolerated treatment well    Behavior During Therapy [facility] for tasks assessed/performed             Past Medical History:  Diagnosis Date   Abdominal bloating 03/13/2019   Allergic rhinitis    Allergy    Arthritis    Asthma    Blood in urine    Cataract    forming    Chronic cough    CKD (chronic kidney disease), stage II    DDD (degenerative disc disease)    Diabetes mellitus (HCC)    DOE (dyspnea on exertion)    Eustachian tube dysfunction    Fatty liver    GERD (gastroesophageal reflux disease)    Glaucoma    Gout    Hard of hearing    bilateral    Heme positive stool 03/13/2019   History of cardiomegaly 05/16/2019   Noted on CXR   History of kidney stones    Hyperlipidemia    Hypertension    Inguinal hernia    Bilateral   Joint pain, knee    Leg edema    Lung tumor (benign), right    Right upper lobe lung mass   Obesity    OSA on CPAP    Pneumonitis    right lung   Pulmonary HTN (HCC) 04/18/2013   Restrictive lung disease    Sciatica    Spinal stenosis    Wears contact lenses    Wears glasses    Wears hearing aid in both ears    Past Surgical History:  Procedure Laterality Date   APPENDECTOMY  1976   cataract surgery Bilateral    march and april 2023   COLONOSCOPY  08/2010   COLONOSCOPY  08/07/2019   HEMORRHOID SURGERY  2001   INGUINAL HERNIA REPAIR Left 1948   as a baby 23 months old   INGUINAL HERNIA REPAIR Bilateral 09/21/2019   Procedure: LAPAROSCOPIC BILATERAL INGUINAL HERNIA REPAIR, LYSIS OF ADHESIONS;  Surgeon: Karie Soda, MD;   Location: [facility];  Service: General;  Laterality: Bilateral;   LEFT AND RIGHT HEART CATHETERIZATION WITH CORONARY ANGIOGRAM N/A 04/20/2013   Procedure: LEFT AND RIGHT HEART CATHETERIZATION WITH CORONARY ANGIOGRAM;  Surgeon: Marykay Lex, MD;  Location: [facility] [place] CATH LAB;  Angiographically normal Coronary Arteries - L Dom Cx (OM1, small LPL1 then LPL2 & PDA) & wraparound LAD (D1-3 small).  Borderline CO/CI - 4.94/2.16 by both Fick & TD.  RAP ~14 mmHg, RVEDP 15 mmHg.  LVEDP 19 mmHg (->diastolic dysfunction) PCWP 16 mmHg, but PA Mean 25 mmHg   LITHOTRIPSY     x 2   LOBECTOMY Right    lumbar disectomy L4-5  1980   NM MYOVIEW LTD  06/15/2019    EF 70%.  No ischemia or infarction.  LOW RISK    TRANSTHORACIC ECHOCARDIOGRAM  04/21/2019   EF 60 to 65%.  Mild LVH.  Normal RV.  Normal atrial size.  Mild aortic sclerosis.  No stenosis. Lipomatous intra-atrial  septum   UPPER GI ENDOSCOPY  08/07/2019   VIDEO ASSISTED THORACOSCOPY (VATS)/WEDGE RESECTION Right 08/30/2013   Procedure: VIDEO ASSISTED THORACOSCOPY (VATS)/WEDGE RESECTION;  Surgeon: Loreli Slot, MD;  Location: [facility] OR;  Service: Thoracic;  Laterality: Right;   Patient Active Problem List   Diagnosis Date Noted   Chronic right-sided low back pain with right-sided sciatica 11/20/2022   Epigastric pain 04/27/2022   Aortic atherosclerosis (HCC) 10/07/2021   Allergic rhinitis 10/02/2021   Difficult airway for intubation 09/21/2019   Bilateral inguinal hernia (BIH) 09/21/2019   Asthma 05/25/2019   Generalized abdominal pain 03/13/2019   Chronic cough 09/08/2018   Spinal stenosis in cervical region 06/07/2018   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   CKD (chronic kidney disease) stage 2, GFR 60-89 ml/min 05/17/2018   OSA on CPAP 05/17/2018   Restrictive lung disease 05/17/2018   Cervical radiculopathy 05/17/2018   Diabetic neuropathy associated with type 2 diabetes mellitus (HCC)  05/17/2018   Hyperuricemia 05/17/2018   Dermatochalasis of both upper eyelids 02/25/2017   Acute angle-closure glaucoma of both eyes 12/04/2016   DM (diabetes mellitus) (HCC) 12/01/2016   Hyperopia with astigmatism and presbyopia, bilateral 12/01/2016   Nuclear sclerotic cataract of left eye 12/01/2016   Posterior subcapsular age-related cataract of both eyes 12/01/2016   Vitreous floater, bilateral 12/01/2016   Scarring of lung 08/30/2013   Microvascular angina 04/18/2013   Pre-operative cardiovascular examination 04/18/2013   Dyspnea on exertion 04/17/2013   Leg edema    Hyperlipidemia associated with type 2 diabetes mellitus (HCC)    HTN (hypertension), benign     PCP: Conni Elliot, NP  REFERRING PROVIDER: Estill Bamberg, MD  REFERRING DIAG: cervical spine stenosis, lumbar spinal stenosis and radiculopathy  THERAPY DIAG:  Stiffness of cervical spine  Weakness of both legs  Abnormal posture  Rationale for Evaluation and Treatment: Rehabilitation  ONSET DATE: chronic  SUBJECTIVE:                                                                                                                                                                                                         SUBJECTIVE STATEMENT: Feeling better today than last time, had injections R side of neck, helped some, not as much as last time.  See him again 04/05/23, Hand dominance: Right  PERTINENT HISTORY:  Chronic R LE radiculopathy, L5/S1 transforaminal steroid injection on R  on 12/10/22, with good relief of R LE radiculopathy.  cervical spine injections 2 weeks ago , didn't help as much as the lumbar injection.  Referred to PT by orthopedist to address both neck and back pain and stiffness  PAIN:  Are you having pain? Yes: NPRS scale: 4/10 Pain location: stiff , sore muscles in neck and B upper traps Pain description: stiffness primarily  Aggravating factors: no specific aggravating positions or  movements Relieving factors: Tylenol  PRECAUTIONS: None  RED FLAGS: None   WEIGHT BEARING RESTRICTIONS: No  FALLS:  Has patient fallen in last 6 months? No  LIVING ENVIRONMENT: Lives with: lives with their spouse Lives in: House/apartment Stairs: Yes: Internal: 11 steps; on left going up Has following equipment at home: Dan Humphreys - 2 wheeled and [employer] - 4 wheeled  OCCUPATION: retired   PLOF: Independent and Needs assistance with homemaking  PATIENT GOALS: improve stiffness, soreness neck and back  NEXT MD VISIT: 8 weeks  OBJECTIVE:   DIAGNOSTIC FINDINGS:  1. Severe multilevel degenerative changes with osseous fusion across nearly every lumbar vertebral body level. There is severe spinal canal stenosis at L1-L4 secondary to a combination congenitally small pedicles degenerative disc disease, and ligamentum flavum hypertrophy 2. Multilevel neural foraminal narrowing, severe bilaterally at L5-S1.    Electronically Signed   By: Lorenza Cambridge M.D.   On: 11/25/2022 14:57  IMPRESSION: 1. 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 2. Advanced foraminal impingement bilaterally at C3-4 to C6-7. 3. Advanced facet osteoarthritis on the right at C1-2 with marrow edema and right C2 impingement. 4. Ankylosis at C2-3, C3-4, and C5-6.     Electronically Signed   By: Tiburcio Pea M.D.   On: 11/19/2022 07:53  PATIENT SURVEYS:  NDI 21/50  COGNITION: Overall cognitive status: Within functional limits for tasks assessed  SENSATION: Light touch: Impaired  R lower leg and foot   POSTURE: flexed trunk , B hips flexed to 20 degrees with forward trunk lean, sloped , rounded B shoulders  PALPATION: Stiff, tender B upper traps, B levator scapulae Minimal jt mobility cervical spine for lateral glides and for cervical distraction   CERVICAL ROM:   Active ROM  A/PROM (deg) eval  Flexion 40  Extension 25  Right lateral flexion   Left lateral flexion   Right rotation 30  Left rotation 0   (Blank rows = not tested) THORACIC SPINE ROM:   rotation 70 % B, FB 70 %, ext 20% LS ROM NT today UPPER EXTREMITY ROM:  Active ROM Right eval Left eval  Shoulder flexion 115   Shoulder extension    Shoulder abduction 110   Shoulder adduction    Shoulder extension    Shoulder internal rotation Behind back to t 11 Behind back to t 11  Shoulder external rotation    Elbow flexion    Elbow extension    Wrist flexion    Wrist extension    Wrist ulnar deviation    Wrist radial deviation    Wrist pronation    Wrist supination     (Blank rows = not tested) LOWER EXTREMITY ROM: B Hip flexion 95, IR 5 degrees, extension NT, B knees, ankles wnl UPPER EXTREMITY MMT:  MMT Right eval Left eval  Shoulder flexion    Shoulder extension    Shoulder abduction 3-   Shoulder adduction    Shoulder extension    Shoulder internal rotation    Shoulder external rotation 4   Middle trapezius    Lower trapezius    Elbow flexion 4-   Elbow  extension    Wrist flexion    Wrist extension    Wrist ulnar deviation    Wrist radial deviation    Wrist pronation    Wrist supination    Grip strength     (Blank rows = wfl) LE MMT:  R ankle dorsiflexion gross strength B LE's 4/5 throughout  CERVICAL SPECIAL TESTS:  Spurling's test: Negative and Distraction test: Negative  FUNCTIONAL TESTS:  10 meter walk test: 8 sec or 0.8 m/sec  GAIT: decreased stance time on L, circumducts R LE in swing phase  TODAY'S TREATMENT:                                                                                                                              DATE: 03/24/23:  Manual:  Trigger Point Dry-Needling  Treatment instructions: Expect mild to moderate muscle soreness. S/S of pneumothorax if dry needled over a lung field, and to seek immediate medical attention should they  occur. Patient verbalized understanding of these instructions and education. Patient Consent Given: Yes  Muscles treated: R upper traps 3 sites with twitch response noted  Supine for alternating bouts of cervical distraction, isometric engagement of R levator, upper cervical musculature contract /relax for R rotation, isometrics B suboccipital musculature  Also in supine manual depression of R posterior 1st rib combined with R cervical rotation with improvement in ROM with this motion    03/10/23 NuStep L5 x61mins  Recheck goals Rows and ext green 2x10 Horizontal abd green 2x10  Pec stretch in doorway  W backs against wall with OP x10  Ankle DF green 20 reps  STM to cervical paraspinals, scalenes, UT with passive stretch to same, cervical flexion ROM and cervical distraction   03/08/23 NuStep L5 x 6 minutes Standing with back to wall, chin tuck to hold ball on wall while performing chest stretch, trying to bring elbows to wall with abd/add STM to cervical paraspinals, scalenes, UT with passive stretch to same, cervical flexion ROM and cervical distraction MH and Interferential e-stim to neck x 10 minutes to assess for possible TNS use for pain relief.  03/03/23:therex: Bike 6 min Reviewed sitting position, ideal height of tv for viewing.  Reviewed nags and snags, patient performing frequently at home.  Manual techniques:  Supine for alternating manual stretching cervical spine and thoracic spine and surrounding musculature, alternating upper traps, levator stretching with manual cervical traction Cervical flexion, with contract relax for rotation B, several sets Prolonged stretch upper traps combined with cervical flexion stretch  02/26/23 Bike L3 x 6 minutes UBE L1 x 2 forward and back Supine STM to base of skull, cervical paraspinals, UT with deep pressure and stretch. Self DTM using 2 tennis balls in a sock for base of skull to address tension headaches, then moved down to  UT Seated with chin tuck to hold ball on wall-Shoulder ext, rows, ER, 2 x 10 reps each. Pect stretch on wall, 3 x 15  sec each side. Seated press ups to activate lats and stretch chest.  02/22/23 NuStep L5 x59mins  Chin tucks red band 2x10  4# UT stretch 30s STM and stretch rotations and side bending Rows and ext green 2x10  Manual distraction in supine   02/19/23 Bike L3 x 6 minutes STM and stretch with manual distraction for neck, patient reported that the distraction felt good Mechanical traction, 14# x 8 minutes Seated shoulder ext and rows with G tband, with chin tuck to hold ball on wall, 2 x 10 each.  02/17/23: Nustep level 5, 6 min, Ue's and LE's to improve tissue perfusion, for light mid range motion extremities and spine and improved joint nutrition Review and practiced previously instructed ex: Seated cervical rotation SNAGs, utilized pillow case, then used webbing strap to improve rotation Seated cervical extension with webbing strap for extension motion  Manual: supine for alternating between manual cervical traction, stretching of upper traps, levator scapulae, and suboccipital musculature, 8 to 10 x each. Also isometrics to engage levator B, and for cervical rotation, cervical occipitals, in bouts of 5 sec holds, 6 to 10 reps for each.  02/01/23 NuStep L5 x 6 minutes STM and stretch to posterior cervical muscles, head pull Chin tucks on towel roll x 10 Seated lower cerv/ up thoracic stretch Seated lats, pushing into mat behind hips Standing shoulder ext, rows, ER against G tband MH to neck for acute pain relief.  01/29/23 NuStep L5 x28mins  Pec stretch 30s x2  Standing against wall shoulder flexion with dowel 2x5  Rows and ext green 2x10  Cervical ext and rotations with towel x10  Horizontal abd red x10  PROM to neck and STM and stretching of upper traps MH for 5 mins   01/27/23:  PT evaluation, instructed in HEP as described below, to improve ease of achieving  upright posture  PATIENT EDUCATION:  Education details: POC, goals  Person educated: Patient Education method: Explanation, Demonstration, Tactile cues, and Verbal cues Education comprehension: verbalized understanding, returned demonstration, and verbal cues required  HOME EXERCISE PROGRAM: Access Code: [email or web address] URL: [email or web address] Date: 01/27/2023 Prepared by: Emmer Lillibridge  Exercises - Supine Lower Trunk Rotation  - 1 x daily - 7 x weekly - 3 sets - 10 reps - Standing shoulder flexion wall slides  - 1 x daily - 7 x weekly - 3 sets - 10 reps - Doorway Pec Stretch at 90 Degrees Abduction  - 1 x daily - 7 x weekly - 3 sets - 10 reps - Hooklying Single Knee to Chest Stretch  - 1 x daily - 7 x weekly - 3 sets - 10 reps  ASSESSMENT:  CLINICAL IMPRESSION: Patient reports continued stiffness and pain in his neck. Did have injection, has bruising R upper traps and post cervical paraspinals, avoided these sites with TPDN today. At this point he will return to pain MD Sept 23, he wishes to hold PT unless needed until then.  He has good comprehensive home routine. Also has a home  cervical traction unit.    Will hold skilled PT at this time. Can participate in skilled treatment again if needed through 04/21/23.                        OBJECTIVE IMPAIRMENTS: Abnormal gait, decreased balance, difficulty walking, decreased ROM, decreased strength, impaired flexibility, impaired sensation, postural dysfunction, and pain.   ACTIVITY LIMITATIONS: carrying, lifting, squatting, stairs, transfers, and locomotion level  PARTICIPATION LIMITATIONS: community  activity, occupation, and yard work  PERSONAL FACTORS: Age, Behavior pattern, Fitness, Past/current experiences, Time since onset of injury/illness/exacerbation, and 3+ comorbidities: Dm, restrictive lung disease, cervical spinal stenosis, lumbar stenosis and radiculopathy  are also affecting patient's functional outcome.   REHAB  POTENTIAL: Good  CLINICAL DECISION MAKING: Evolving/moderate complexity  EVALUATION COMPLEXITY: Moderate   GOALS: Goals reviewed with patient? Yes  SHORT TERM GOALS: Target date: 2 weeks 02/10/23  I HEP Baseline: initiated today Goal status: MET 03/10/23, 02/19/23-Program initiated, ongoing   LONG TERM GOALS: Target date: 04/21/23  Improve gait speed to greater than 1 m/sec for improved community ambulation Baseline: 0.8 m/sec Goal status: ongoing 0.9 m/sec  2.  NDI improve from 21/50 to 11/50 Baseline:  Goal status: ongoing 14/50 03/10/23  3.  Improve B LE strength to 5/5 with MMT except dorsiflexion R ankle Baseline: 4/5 gross Goal status: MET 03/10/23  PLAN:  PT FREQUENCY: 1-2x/week  PT DURATION: 8 weeks  PLANNED INTERVENTIONS: Therapeutic exercises, Therapeutic activity, Neuromuscular re-education, Balance training, Gait training, Patient/Family education, Self Care, and Joint mobilization  PLAN FOR NEXT SESSION: periscapular strengthening, dry needling, manual techniques as indicated Yanelly Cantrelle, PT, DPT, OCS 03/24/23  03/24/23 4:16 PM

## 2023-03-30 DIAGNOSIS — R609 Edema, unspecified: Secondary | ICD-10-CM | POA: Diagnosis not present

## 2023-03-30 DIAGNOSIS — M25561 Pain in right knee: Secondary | ICD-10-CM | POA: Diagnosis not present

## 2023-03-30 DIAGNOSIS — R531 Weakness: Secondary | ICD-10-CM | POA: Diagnosis not present

## 2023-04-05 DIAGNOSIS — M47812 Spondylosis without myelopathy or radiculopathy, cervical region: Secondary | ICD-10-CM | POA: Diagnosis not present

## 2023-04-08 ENCOUNTER — Other Ambulatory Visit: Payer: Self-pay | Admitting: Adult Health

## 2023-04-08 DIAGNOSIS — M25561 Pain in right knee: Secondary | ICD-10-CM | POA: Diagnosis not present

## 2023-04-29 DIAGNOSIS — M1711 Unilateral primary osteoarthritis, right knee: Secondary | ICD-10-CM | POA: Diagnosis not present

## 2023-05-02 ENCOUNTER — Other Ambulatory Visit: Payer: Self-pay | Admitting: Nurse Practitioner

## 2023-05-02 DIAGNOSIS — J4530 Mild persistent asthma, uncomplicated: Secondary | ICD-10-CM

## 2023-05-06 ENCOUNTER — Encounter: Payer: Self-pay | Admitting: Nurse Practitioner

## 2023-05-06 ENCOUNTER — Ambulatory Visit: Payer: Medicare Other | Admitting: Nurse Practitioner

## 2023-05-06 VITALS — BP 129/68 | HR 62 | Temp 98.3°F | Resp 18 | Wt 242.2 lb

## 2023-05-06 DIAGNOSIS — E1169 Type 2 diabetes mellitus with other specified complication: Secondary | ICD-10-CM | POA: Diagnosis not present

## 2023-05-06 DIAGNOSIS — Z7984 Long term (current) use of oral hypoglycemic drugs: Secondary | ICD-10-CM

## 2023-05-06 DIAGNOSIS — J4531 Mild persistent asthma with (acute) exacerbation: Secondary | ICD-10-CM | POA: Diagnosis not present

## 2023-05-06 LAB — POCT GLYCOSYLATED HEMOGLOBIN (HGB A1C): Hemoglobin A1C: 7.5 % — AB (ref 4.0–5.6)

## 2023-05-06 LAB — GLUCOSE, POCT (MANUAL RESULT ENTRY): POC Glucose: 108 mg/dL — AB (ref 70–99)

## 2023-05-06 MED ORDER — AMOXICILLIN-POT CLAVULANATE 875-125 MG PO TABS
1.0000 | ORAL_TABLET | Freq: Two times a day (BID) | ORAL | 0 refills | Status: DC
Start: 2023-05-06 — End: 2023-05-12

## 2023-05-06 MED ORDER — BENZONATATE 200 MG PO CAPS
200.0000 mg | ORAL_CAPSULE | Freq: Three times a day (TID) | ORAL | 0 refills | Status: DC | PRN
Start: 2023-05-06 — End: 2023-11-22

## 2023-05-06 MED ORDER — PREDNISONE 20 MG PO TABS
40.0000 mg | ORAL_TABLET | Freq: Every day | ORAL | 0 refills | Status: DC
Start: 2023-05-06 — End: 2023-05-13

## 2023-05-06 MED ORDER — ALBUTEROL SULFATE HFA 108 (90 BASE) MCG/ACT IN AERS
2.0000 | INHALATION_SPRAY | Freq: Four times a day (QID) | RESPIRATORY_TRACT | 0 refills | Status: AC | PRN
Start: 2023-05-06 — End: ?

## 2023-05-06 MED ORDER — ALBUTEROL SULFATE (2.5 MG/3ML) 0.083% IN NEBU
2.5000 mg | INHALATION_SOLUTION | Freq: Once | RESPIRATORY_TRACT | Status: AC
Start: 2023-05-06 — End: ?

## 2023-05-06 NOTE — Progress Notes (Signed)
Established Patient Visit  Patient: Andres Taylor   DOB: Aug 30, 1945   77 y.o. Male  MRN: [id number] Visit Date: 05/06/2023  Subjective:    Chief Complaint  Patient presents with   Acute Visit    PT is here for cough, chest congestion, and head cold for 1 week. PT took OTC medication robitussin for cough    Cough This is a new problem. The current episode started in the past 7 days. The problem has been unchanged. The problem occurs constantly. The cough is Productive of sputum. Associated symptoms include nasal congestion, postnasal drip, rhinorrhea, shortness of breath and wheezing. Pertinent negatives include no chest pain, chills, ear congestion, ear pain, fever, headaches, heartburn, hemoptysis, myalgias, rash, sore throat, sweats or weight loss. The symptoms are aggravated by lying down. He has tried OTC cough suppressant for the symptoms. The treatment provided mild relief. His past medical history is significant for asthma and bronchitis. There is no history of bronchiectasis, COPD, emphysema, environmental allergies or pneumonia.  No improvement with robitussin and sinus irrigation. Denies any worsening LE edema.  Wt Readings from Last 3 Encounters:  05/06/23 242 lb 3.2 oz (109.9 kg)  11/25/22 240 lb (108.9 kg)  11/20/22 245 lb (111.1 kg)    Reviewed medical, surgical, and social history today  Medications: Outpatient Medications Prior to Visit  Medication Sig   allopurinol (ZYLOPRIM) 100 MG tablet TAKE 1 TABLET BY MOUTH DAILY   aspirin EC 81 MG tablet Take 81 mg by mouth daily.   atorvastatin (LIPITOR) 20 MG tablet TAKE 1 TABLET BY MOUTH DAILY   BREO ELLIPTA 200-25 MCG/ACT AEPB USE 1 INHALATION BY MOUTH DAILY   cetirizine (ZYRTEC) 10 MG chewable tablet Chew 10 mg by mouth daily.   Coenzyme Q-10 100 MG capsule Take 100 mg by mouth 2 (two) times daily.   Continuous Glucose Sensor (DEXCOM G6 SENSOR) MISC Replace sensor every 10days   Continuous Glucose  Transmitter (DEXCOM G6 TRANSMITTER) MISC Change transmitter every 90days   diclofenac Sodium (VOLTAREN) 1 % GEL Apply 2 g topically 4 (four) times daily.   diltiazem (CARDIZEM CD) 180 MG 24 hr capsule TAKE 1 CAPSULE(180 MG) BY MOUTH DAILY   furosemide (LASIX) 20 MG tablet TAKE 1 TABLET BY MOUTH AS NEEDED, DO NOT TAKE HCTZ THE DAY YOU USE MEDICATION AS NEEDED FOR SWELLING OR SHORTNESS OF BREATH   gabapentin (NEURONTIN) 300 MG capsule Take 1 capsule (300 mg total) by mouth 3 (three) times daily.   guaiFENesin (MUCINEX) 600 MG 12 hr tablet Take 600 mg by mouth daily.   hydrochlorothiazide (MICROZIDE) 12.5 MG capsule Take 1 capsule (12.5 mg total) by mouth daily.   ipratropium (ATROVENT) 0.06 % nasal spray USE 2 SPRAYS IN BOTH NOSTRILS IN THE MORNING AND AT BEDTIME   meloxicam (MOBIC) 15 MG tablet Take 15 mg by mouth daily.   metFORMIN (GLUCOPHAGE) 850 MG tablet Take 1 tablet (850 mg total) by mouth 2 (two) times daily with a meal. Take 1bid   methocarbamol (ROBAXIN) 500 MG tablet Take 1 tablet (500 mg total) by mouth 2 (two) times daily as needed for muscle spasms. TAKE 1 TABLET BY MOUTH EVERY 12  HOURS   montelukast (SINGULAIR) 10 MG tablet TAKE 1 TABLET BY MOUTH AT  BEDTIME   Multiple Vitamin (MULTIVITAMIN) tablet Take 1 tablet by mouth daily.   naproxen (NAPROSYN) 500 MG tablet Take 1 tablet (500 mg total)  by mouth 2 (two) times daily with a meal.   olmesartan (BENICAR) 20 MG tablet TAKE 1 TABLET BY MOUTH  DAILY   omega-3 acid ethyl esters (LOVAZA) 1 g capsule Take 1 capsule (1 g total) by mouth daily.   senna-docusate (SENOKOT-S) 8.6-50 MG tablet Take 1 tablet by mouth at bedtime as needed for mild constipation.   [DISCONTINUED] PROAIR RESPICLICK 108 (90 Base) MCG/ACT AEPB INHALE 2 PUFFS EVERY 4-6 HOURS AS NEEDED FOR COUGH/ WHEEZE   [DISCONTINUED] oxyCODONE-acetaminophen (PERCOCET/ROXICET) 5-325 MG tablet Take 1 tablet by mouth every 6 (six) hours as needed for severe pain. (Patient not taking:  Reported on 05/06/2023)   No facility-administered medications prior to visit.   Reviewed past medical and social history.   ROS per HPI above      Objective:  BP 129/68 (BP Location: Left Arm, Patient Position: Sitting, Cuff Size: Large)   Pulse 62   Temp 98.3 F (36.8 C) (Oral)   Resp 18   Wt 242 lb 3.2 oz (109.9 kg)   SpO2 96%   BMI 37.93 kg/m      Physical Exam Vitals and nursing note reviewed.  Constitutional:      General: He is not in acute distress.    Appearance: He is obese. He is not ill-appearing.  Cardiovascular:     Rate and Rhythm: Normal rate and regular rhythm.     Pulses: Normal pulses.     Heart sounds: Normal heart sounds.  Pulmonary:     Effort: Pulmonary effort is normal.     Breath sounds: Wheezing present.     Comments: Diminished lung sounds prior to nebulizer treatment. Post nebulizer treatment: Equal and clear lung sounds in bilateral upper lobes. Persistent Diminished lung sounds in bilateral lower lobes without any rales or wheezing  Musculoskeletal:     Right lower leg: Edema present.     Left lower leg: Edema present.  Neurological:     Mental Status: He is alert and oriented to person, place, and time.     Results for orders placed or performed in visit on 05/06/23  POCT glycosylated hemoglobin (Hb A1C)  Result Value Ref Range   Hemoglobin A1C 7.5 (A) 4.0 - 5.6 %   HbA1c POC (<> result, manual entry)     HbA1c, POC (prediabetic range)     HbA1c, POC (controlled diabetic range)    POCT Glucose (CBG)  Result Value Ref Range   POC Glucose 108 (A) 70 - 99 mg/dl      Assessment & Plan:    Problem List Items Addressed This Visit     Asthma - Primary    Acute asthma exacerbation due to change in weather and/or viral infection Sent short course of prednisone x 3days,and augmentin x 7days Alternate guaifenesin dm with benzonatate Encouraged to use albuterol s-3x/day x 3days, then switch to prn.      Relevant Medications    albuterol (PROVENTIL) (2.5 MG/3ML) 0.083% nebulizer solution 2.5 mg   albuterol (VENTOLIN HFA) 108 (90 Base) MCG/ACT inhaler   predniSONE (DELTASONE) 20 MG tablet   benzonatate (TESSALON) 200 MG capsule   amoxicillin-clavulanate (AUGMENTIN) 875-125 MG tablet   DM (diabetes mellitus) (HCC)    Repeat hgbA1c at 7.5% Random glucose at 108 Controlled with metformin, No adverse effect No glucose check at home Maintain med dose      Relevant Orders   POCT glycosylated hemoglobin (Hb A1C) (Completed)   POCT Glucose (CBG) (Completed)   Return if symptoms worsen  or fail to improve.     Alysia Penna, NP

## 2023-05-06 NOTE — Assessment & Plan Note (Signed)
Repeat hgbA1c at 7.5% Random glucose at 108 Controlled with metformin, No adverse effect No glucose check at home Maintain med dose

## 2023-05-06 NOTE — Assessment & Plan Note (Addendum)
Acute asthma exacerbation due to change in weather and/or viral infection Sent short course of prednisone x 3days,and augmentin x 7days Alternate guaifenesin dm with benzonatate Encouraged to use albuterol s-3x/day x 3days, then switch to prn.

## 2023-05-06 NOTE — Patient Instructions (Addendum)
HgbA1c at 7.5%: DM remains controlled Start augmentin and oral prednisone Alternate robitussin Dm and benzonatate for cough Use albuterol inhaler 2x/day x 3days, then as needed

## 2023-05-10 ENCOUNTER — Other Ambulatory Visit: Payer: Self-pay | Admitting: Nurse Practitioner

## 2023-05-10 ENCOUNTER — Ambulatory Visit (INDEPENDENT_AMBULATORY_CARE_PROVIDER_SITE_OTHER)
Admission: RE | Admit: 2023-05-10 | Discharge: 2023-05-10 | Disposition: A | Discharge status: Home / Self Care | Payer: Medicare Other | Source: Ambulatory Visit | Attending: Nurse Practitioner

## 2023-05-10 ENCOUNTER — Telehealth: Payer: Self-pay | Admitting: Nurse Practitioner

## 2023-05-10 DIAGNOSIS — J4531 Mild persistent asthma with (acute) exacerbation: Secondary | ICD-10-CM

## 2023-05-10 DIAGNOSIS — R0609 Other forms of dyspnea: Secondary | ICD-10-CM | POA: Diagnosis not present

## 2023-05-10 DIAGNOSIS — R918 Other nonspecific abnormal finding of lung field: Secondary | ICD-10-CM | POA: Diagnosis not present

## 2023-05-10 DIAGNOSIS — J40 Bronchitis, not specified as acute or chronic: Secondary | ICD-10-CM | POA: Diagnosis not present

## 2023-05-10 DIAGNOSIS — R0602 Shortness of breath: Secondary | ICD-10-CM | POA: Diagnosis not present

## 2023-05-10 DIAGNOSIS — R059 Cough, unspecified: Secondary | ICD-10-CM | POA: Diagnosis not present

## 2023-05-10 NOTE — Telephone Encounter (Signed)
Pt is having the same symptoms as he was on Friday. He wanted to come back in. He is being sent for a CXR.per [employer]

## 2023-05-12 ENCOUNTER — Telehealth: Payer: Self-pay | Admitting: Nurse Practitioner

## 2023-05-12 DIAGNOSIS — J4531 Mild persistent asthma with (acute) exacerbation: Secondary | ICD-10-CM

## 2023-05-12 MED ORDER — AMOXICILLIN-POT CLAVULANATE 875-125 MG PO TABS
1.0000 | ORAL_TABLET | Freq: Two times a day (BID) | ORAL | 0 refills | Status: DC
Start: 2023-05-12 — End: 2023-05-13

## 2023-05-12 NOTE — Progress Notes (Unsigned)
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   CT chest January 2015 2.7 cm nodule in the right upper lobe   Chest x-ray 2019 no acute process   Previous echo in 2014 showed moderate pulmonary hypertension however repeat echo last month April 21, 2019 showed mild pulmonary hypertension at most   High-resolution CT chest November 2020 showed no evidence of fibrotic interstitial lung disease, bandlike scarring and atelectasis at the right lower lobe with elevation of the right hemidiaphragm similar to CT in 2015 consistent with sequela postinfectious process.,  Status post right upper lobe wedge resection.    History of Present Illness  05/13/2023  Pulmonary/ 1st office eval/[facility] maint on BREO 200 Sharene Butters  Chief Complaint  Patient presents with   Acute Visit    Dry cough x 2 weeks.  Unable to cough any mucus up until last 2 days.  Dyspnea:  publix slower than usual leaning onrollator or cart Cough: sticky / not purulent but only min volume p violent coughiong fits no better with saba  Sleep: ok flat on cpap   No obvious other patterns in day to day or daytime   variability or assoc ongoing  purulent sputum or mucus plugs or hemoptysis or cp or chest tightness, subjective wheeze or overt sinus or hb symptoms.    Also denies any obvious fluctuation of symptoms with weather or environmental changes or other aggravating or alleviating factors except as outlined above   No unusual exposure hx or h/o childhood pna/ asthma or knowledge of premature birth.  Current Allergies, Complete Past Medical History, Past Surgical History, Family History, and Social History were reviewed in [employer] record.  ROS  The following are not active complaints unless bolded Hoarseness, sore throat, dysphagia, dental problems, itching, sneezing,  nasal congestion or discharge of excess mucus or purulent secretions, ear ache,   fever, chills, sweats, unintended wt loss or wt gain, classically pleuritic or exertional cp,  orthopnea pnd or arm/hand swelling  or leg swelling, presyncope, palpitations, abdominal pain, anorexia, nausea, vomiting, diarrhea  or change in bowel habits or change in bladder habits, change in stools or change in urine, dysuria, hematuria,  rash, arthralgias, visual complaints, headache, numbness, weakness or ataxia or problems with walking or coordination,  change in mood or  memory.          Outpatient Medications Prior to Visit  Medication Sig Dispense Refill   albuterol (VENTOLIN HFA) 108 (90 Base) MCG/ACT inhaler Inhale 2 puffs into the lungs every 6 (six) hours as needed. 8 g 0   allopurinol (ZYLOPRIM) 100 MG tablet  TAKE 1 TABLET BY MOUTH DAILY 90 tablet 3   amoxicillin-clavulanate (AUGMENTIN) 875-125 MG tablet Take 1 tablet by mouth 2 (two) times daily. 14 tablet 0   aspirin EC 81 MG tablet Take 81 mg by mouth daily.     atorvastatin (LIPITOR) 20 MG tablet TAKE 1 TABLET BY MOUTH DAILY 90 tablet 3   benzonatate (TESSALON) 200 MG capsule Take 1 capsule (200 mg total) by mouth 3 (three) times daily as needed. 21 capsule 0   BREO  ELLIPTA 200-25 MCG/ACT AEPB USE 1 INHALATION BY MOUTH DAILY 180 each 3   cetirizine (ZYRTEC) 10 MG chewable tablet Chew 10 mg by mouth daily.     Coenzyme Q-10 100 MG capsule Take 100 mg by mouth 2 (two) times daily.     Continuous Glucose Sensor (DEXCOM G6 SENSOR) MISC Replace sensor every 10days 3 each 11   Continuous Glucose Transmitter (DEXCOM G6 TRANSMITTER) MISC Change transmitter every 90days 1 each 3   diclofenac Sodium (VOLTAREN) 1 % GEL Apply 2 g topically 4 (four) times daily. 100 g 0   diltiazem (CARDIZEM CD) 180 MG 24 hr capsule TAKE 1 CAPSULE(180 MG) BY MOUTH DAILY 90 capsule 3   furosemide (LASIX) 20 MG tablet TAKE 1 TABLET BY MOUTH AS NEEDED, DO NOT TAKE HCTZ THE DAY YOU USE MEDICATION AS NEEDED FOR SWELLING OR SHORTNESS OF BREATH 20 tablet 0   gabapentin (NEURONTIN) 300 MG capsule Take 1 capsule (300 mg total) by mouth 3 (three) times daily. 90 capsule 5   guaiFENesin (MUCINEX) 600 MG 12 hr tablet Take 600 mg by mouth daily.     hydrochlorothiazide (MICROZIDE) 12.5 MG capsule Take 1 capsule (12.5 mg total) by mouth daily. 90 capsule 1   ipratropium (ATROVENT) 0.06 % nasal spray USE 2 SPRAYS IN BOTH NOSTRILS IN THE MORNING AND AT BEDTIME 75 mL 3   meloxicam (MOBIC) 15 MG tablet Take 15 mg by mouth daily.     metFORMIN (GLUCOPHAGE) 850 MG tablet Take 1 tablet (850 mg total) by mouth 2 (two) times daily with a meal. Take 1bid 180 tablet 3   methocarbamol (ROBAXIN) 500 MG tablet Take 1 tablet (500 mg total) by mouth 2 (two) times daily as needed for muscle spasms. TAKE 1 TABLET BY MOUTH EVERY 12  HOURS 180 tablet 1   montelukast (SINGULAIR) 10 MG tablet TAKE 1 TABLET BY MOUTH AT  BEDTIME 90 tablet 3   Multiple Vitamin (MULTIVITAMIN) tablet Take 1 tablet by mouth daily.     naproxen (NAPROSYN) 500 MG tablet Take 1 tablet (500 mg total) by mouth 2 (two) times daily with a meal. 30 tablet 0   olmesartan (BENICAR) 20 MG tablet TAKE 1 TABLET BY MOUTH  DAILY 90 tablet 3   omega-3 acid ethyl  esters (LOVAZA) 1 g capsule Take 1 capsule (1 g total) by mouth daily. 90 capsule 3   predniSONE (DELTASONE) 20 MG tablet Take 2 tablets (40 mg total) by mouth daily with breakfast. 6 tablet 0   senna-docusate (SENOKOT-S) 8.6-50 MG tablet Take 1 tablet by mouth at bedtime as needed for mild constipation. 30 tablet 0   Facility-Administered Medications Prior to Visit  Medication Dose Route Frequency Provider Last Rate Last Admin   albuterol (PROVENTIL) (2.5 MG/3ML) 0.083% nebulizer solution 2.5 mg  2.5 mg Nebulization Once         Past Medical History:  Diagnosis Date   Abdominal bloating 03/13/2019   Allergic rhinitis    Allergy  Arthritis    Asthma    Blood in urine    Cataract    forming    Chronic cough    CKD (chronic kidney disease), stage II    DDD (degenerative disc disease)    Diabetes mellitus (HCC)    DOE (dyspnea on exertion)    Eustachian tube dysfunction    Fatty liver    GERD (gastroesophageal reflux disease)    Glaucoma    Gout    Hard of hearing    bilateral    Heme positive stool 03/13/2019   History of cardiomegaly 05/16/2019   Noted on CXR   History of kidney stones    Hyperlipidemia    Hypertension    Inguinal hernia    Bilateral   Joint pain, knee    Leg edema    Lung tumor (benign), right    Right upper lobe lung mass   Obesity    OSA on CPAP    Pneumonitis    right lung   Pulmonary HTN (HCC) 04/18/2013   Restrictive lung disease    Sciatica    Spinal stenosis    Wears contact lenses    Wears glasses    Wears hearing aid in both ears       Objective:     BP 114/60 (BP Location: Left Arm, Patient Position: Sitting, Cuff Size: Large)   Pulse 85   Temp 98.6 F (37 C) (Oral)   Ht 5\' 8"  (1.727 m)   Wt 246 lb 12.8 oz (111.9 kg)   SpO2 94%   BMI 37.53 kg/m   SpO2: 94 %   RA  Amb somber wm not acutely ill    HEENT : Oropharynx  clear      Nasal turbinates nl    NECK :  without  apparent JVD/ palpable Nodes/TM    LUNGS: no  acc muscle use,  Nl contour chest  min exp wheeze L > R base  without cough on insp or exp maneuvers   CV:  RRR  no s3 or murmur or increase in P2, and no edema   ABD:  soft and nontender with nl inspiratory excursion in the supine position. No bruits or organomegaly appreciated   MS:  Nl gait/ ext warm without deformities Or obvious joint restrictions  calf tenderness, cyanosis or clubbing    SKIN: warm and dry without lesions    NEURO:  alert, approp, nl sensorium with  no motor or cerebellar deficits apparent.       I personally reviewed images and agree with radiology impression as follows:  CXR:   05/10/23 1. No acute abnormality. 2. Stable mild bronchitic changes. 3. Stable elevation of the right hemidiaphragm, borderline cardiomegaly and linear atelectasis or scarring in the right lower lobe.   Assessment   Cough variant asthma with UACS component Onset "years after stopped smoking in 2004" worse in Fall with "head cold always goes to chest" - PFTs 8/212/20 very min airflow obstruction  low ERV - acute recurrence mid oct 2024 no better on BREO 200 / ored/ amox  - 05/13/2023  After extensive coaching inhaler device,  effectiveness =    75% from baseline near 0 with hfa so trained on breztri sample and rx symb 80 2bid and gerd rx while coughing    DDX of  difficult airways management almost all start with A and  include Adherence, Ace Inhibitors, Acid Reflux, Active Sinus Disease, Alpha 1 Antitripsin deficiency, Anxiety masquerading as Airways  dz,  ABPA,  Allergy(esp in young), Aspiration (esp in elderly), Adverse effects of meds,  Active smoking or vaping, A bunch of PE's (a small clot burden can't cause this syndrome unless there is already severe underlying pulm or vascular dz with poor reserve) plus two Bs  = Bronchiectasis and Beta blocker use..and one C= CHF   Adherence is always the initial "prime suspect" and is a multilayered concern that requires a "trust but verify"  approach in every patient - starting with knowing how to use medications, especially inhalers, correctly, keeping up with refills and understanding the fundamental difference between maintenance and prns vs those medications only taken for a very short course and then stopped and not refilled.  - see hfa teaching  - return with all meds in hand using a trust but verify approach to confirm accurate Medication  Reconciliation The principal here is that until we are certain that the  patients are doing what we've asked, it makes no sense to ask them to do more.   ? Acid (or non-acid) GERD > always difficult to exclude as up to 75% of pts in some series report no assoc GI/ Heartburn symptoms> rec max (24h)  acid suppression and diet restrictions/ reviewed and instructions given in writing.   ? Adverse drug effects > BREO 200 not helping and DPI may be aggravating the cough so try symbicort 80 2bid and approp saba hfa as plan B and neb as plan C (see ABC plan)  Re SABA :  I spent extra time with pt today reviewing appropriate use of albuterol for prn use on exertion with the following points: 1) saba is for relief of sob that does not improve by walking a slower pace or resting but rather if the pt does not improve after trying this first. 2) If the pt is convinced, as many are, that saba helps recover from activity faster then it's easy to tell if this is the case by re-challenging : ie stop, take the inhaler, then p 5 minutes try the exact same activity (intensity of workload) that just caused the symptoms and see if they are substantially diminished or not after saba 3) if there is an activity that reproducibly causes the symptoms, try the saba 15 min before the activity on alternate days   If in fact the saba really does help, then fine to continue to use it prn but advised may need to look closer at the maintenance regimen being used to achieve better control of airways disease with exertion.   ?  Active sinus dz > augmentin should be plenty for now   F/u with NP w/in 6 weeks  - call sooner if needed          Each maintenance medication was reviewed in detail including emphasizing most importantly the difference between maintenance and prns and under what circumstances the prns are to be triggered using an action plan format where appropriate.  Total time for H and P, chart review, counseling, reviewing hfa device(s) and generating customized AVS unique to this office visit / same day charting = 44 min with pt new to me           Sandrea Hughs, MD 05/13/2023

## 2023-05-12 NOTE — Telephone Encounter (Signed)
Spoke to patient and informed him that additional Augmentin was sent to pharmacy and to take for 7 days and to continue to use albuterol and breo inhalers. I also advise to schedule f/up appointment with pulmonology. Patient verbalized understanding and stated he have pulmonology appointment set for tomorrow at 10:30 am

## 2023-05-12 NOTE — Telephone Encounter (Signed)
-----   Message from [facility] Ashlee G sent at 05/11/2023  4:26 PM EDT ----- Regarding: URI symptoms Patient is using inhaler daily as directed. He is feeling better but still is coughing up phlegm chest soreness due to frequent coughing. Patient voiced he is out of antibiotics (Prednisone finished course) with 2 tabs left of Amoxicillin. ----- Message ----- From: Anne Ng, NP Sent: 05/11/2023   3:46 PM EDT To: Leroy Kennedy, CMA  CXR indicates chronic bronchitis. No pneumonia Are you using Breo inhaler daily?

## 2023-05-13 ENCOUNTER — Ambulatory Visit (INDEPENDENT_AMBULATORY_CARE_PROVIDER_SITE_OTHER): Payer: Medicare Other | Admitting: Internal Medicine

## 2023-05-13 ENCOUNTER — Telehealth: Payer: Self-pay | Admitting: Internal Medicine

## 2023-05-13 ENCOUNTER — Encounter: Payer: Self-pay | Admitting: Internal Medicine

## 2023-05-13 VITALS — BP 114/60 | HR 85 | Temp 98.6°F | Ht 68.0 in | Wt 246.8 lb

## 2023-05-13 DIAGNOSIS — J45991 Cough variant asthma: Secondary | ICD-10-CM | POA: Diagnosis not present

## 2023-05-13 MED ORDER — BUDESONIDE-FORMOTEROL FUMARATE 80-4.5 MCG/ACT IN AERO: INHALATION_SPRAY | RESPIRATORY_TRACT | 12 refills | Status: DC

## 2023-05-13 MED ORDER — BREZTRI AEROSPHERE 160-9-4.8 MCG/ACT IN AERO: 2.0000 | INHALATION_SPRAY | Freq: Two times a day (BID) | RESPIRATORY_TRACT | Status: DC

## 2023-05-13 NOTE — Telephone Encounter (Signed)
PT was told to be seen at [facility] first avail. She is not avail until January. PT going to [facility] in Jan. Can we double book w/[name]? Please call PT to advise @ [phone]

## 2023-05-13 NOTE — Assessment & Plan Note (Addendum)
Onset "years after stopped smoking in 2004" worse in Fall with "head cold always goes to chest" - PFTs 8/212/20 very min airflow obstruction  low ERV - acute recurrence mid oct 2024 no better on BREO 200 / ored/ amox  - 05/13/2023  After extensive coaching inhaler device,  effectiveness =    75% from baseline near 0 with hfa so trained on breztri sample and rx symb 80 2bid and gerd rx while coughing    DDX of  difficult airways management almost all start with A and  include Adherence, Ace Inhibitors, Acid Reflux, Active Sinus Disease, Alpha 1 Antitripsin deficiency, Anxiety masquerading as Airways dz,  ABPA,  Allergy(esp in young), Aspiration (esp in elderly), Adverse effects of meds,  Active smoking or vaping, A bunch of PE's (a small clot burden can't cause this syndrome unless there is already severe underlying pulm or vascular dz with poor reserve) plus two Bs  = Bronchiectasis and Beta blocker use..and one C= CHF   Adherence is always the initial "prime suspect" and is a multilayered concern that requires a "trust but verify" approach in every patient - starting with knowing how to use medications, especially inhalers, correctly, keeping up with refills and understanding the fundamental difference between maintenance and prns vs those medications only taken for a very short course and then stopped and not refilled.  - see hfa teaching  - return with all meds in hand using a trust but verify approach to confirm accurate Medication  Reconciliation The principal here is that until we are certain that the  patients are doing what we've asked, it makes no sense to ask them to do more.   ? Acid (or non-acid) GERD > always difficult to exclude as up to 75% of pts in some series report no assoc GI/ Heartburn symptoms> rec max (24h)  acid suppression and diet restrictions/ reviewed and instructions given in writing.   ? Adverse drug effects > BREO 200 not helping and DPI may be aggravating the cough so  try symbicort 80 2bid and approp saba hfa as plan B and neb as plan C (see ABC plan)  Re SABA :  I spent extra time with pt today reviewing appropriate use of albuterol for prn use on exertion with the following points: 1) saba is for relief of sob that does not improve by walking a slower pace or resting but rather if the pt does not improve after trying this first. 2) If the pt is convinced, as many are, that saba helps recover from activity faster then it's easy to tell if this is the case by re-challenging : ie stop, take the inhaler, then p 5 minutes try the exact same activity (intensity of workload) that just caused the symptoms and see if they are substantially diminished or not after saba 3) if there is an activity that reproducibly causes the symptoms, try the saba 15 min before the activity on alternate days   If in fact the saba really does help, then fine to continue to use it prn but advised may need to look closer at the maintenance regimen being used to achieve better control of airways disease with exertion.   ? Active sinus dz > augmentin should be plenty for now   F/u with NP w/in 6 weeks  - call sooner if needed          Each maintenance medication was reviewed in detail including emphasizing most importantly the difference between maintenance and prns  and under what circumstances the prns are to be triggered using an action plan format where appropriate.  Total time for H and P, chart review, counseling, reviewing hfa device(s) and generating customized AVS unique to this office visit / same day charting = 44 min with pt new to me

## 2023-05-13 NOTE — Telephone Encounter (Signed)
Or me or any available provider

## 2023-05-13 NOTE — Telephone Encounter (Signed)
Hello, Sir- It said on the AVS to see [name]. I can search other providers (MD's) with your permission or another NP. Most NP's not avail until Late Dec/Jan. Mannam has mid November.

## 2023-05-13 NOTE — Telephone Encounter (Signed)
Can add on to mine any day at  the 1130 schedule or Tuesday 4 pm   If nothing still available, ok to send to [facility] next (since he doesn't really know the pt)

## 2023-05-13 NOTE — Patient Instructions (Signed)
Plan A = Automatic = Always=    stop breo and start Symbicort 80(or Breztri)  Take 2 puffs first thing in am and then another 2 puffs about 12 hours later.    Work on inhaler technique:  relax and gently blow all the way out then take a nice smooth full deep breath back in, triggering the inhaler at same time you start breathing in.  Hold breath in for at least  5 seconds if you can. Blow out breztri or symbicort 80 thru nose. Rinse and gargle with water when done.  If mouth or throat bother you at all,  try brushing teeth/gums/tongue with arm and hammer toothpaste/ make a slurry and gargle and spit out.   >>>  Remember how golfers warm up by taking practice swings - do this with an empty inhaler   Plan B = Backup (to supplement plan A, not to replace it) Only use your albuterol inhaler as a rescue medication to be used if you can't catch your breath by resting or doing a relaxed purse lip breathing pattern.  - The less you use it, the better it will work when you need it. - Ok to use the inhaler up to 2 puffs  every 4 hours if you must but call for appointment if use goes up over your usual need - Don't leave home without it !!  (think of it like the spare tire for your car)   Plan C = Crisis (instead of Plan B but only if Plan B stops working) - only use your albuterol nebulizer if you first try Plan B and it fails to help > ok to use the nebulizer up to every 4 hours but if start needing it regularly call for immediate appointment  For cough/ congestion >  mucinex dm  up to maximum of  1200 mg every 12 hours as needed  .Pantoprazole (protonix) 40 mg   Take  30-60 min before first meal of the day and Pepcid (famotidine)  20 mg after supper until cough is gone for at least a week without the need for cough meds   GERD (REFLUX)  is an extremely common cause of respiratory symptoms just like yours , many times with no obvious heartburn at all.    It can be treated with medication, but also with  lifestyle changes including elevation of the head of your bed (ideally with 6 -8inch blocks under the headboard of your bed),  Smoking cessation, avoidance of late meals, excessive alcohol, and avoid fatty foods, chocolate, peppermint, colas, red wine, and acidic juices such as orange juice.  NO MINT OR MENTHOL PRODUCTS SO NO COUGH DROPS  USE SUGARLESS CANDY INSTEAD (Jolley ranchers or Stover's or Life Savers) or even ice chips will also do - the key is to swallow to prevent all throat clearing. NO OIL BASED VITAMINS - use powdered substitutes.  Avoid fish oil when coughing.    Follow up with [name] NP  next available

## 2023-05-13 NOTE — Addendum Note (Signed)
Addended byClyda Greener M on: 05/13/2023 03:03 PM   Modules accepted: Orders

## 2023-05-18 ENCOUNTER — Telehealth: Payer: Self-pay | Admitting: Nurse Practitioner

## 2023-05-18 NOTE — Telephone Encounter (Signed)
Pt needs Pantoprazole (protonix) 40 mg  to be sent into pharmacy

## 2023-05-18 NOTE — Telephone Encounter (Signed)
Made appt w/Ms. Cobb in Nov. NFN

## 2023-05-20 NOTE — Telephone Encounter (Signed)
Lm x1 for patient.  

## 2023-05-25 ENCOUNTER — Encounter: Payer: Self-pay | Admitting: Nurse Practitioner

## 2023-05-25 ENCOUNTER — Ambulatory Visit (INDEPENDENT_AMBULATORY_CARE_PROVIDER_SITE_OTHER): Payer: Medicare Other | Admitting: Nurse Practitioner

## 2023-05-25 VITALS — BP 122/63 | HR 67 | Temp 97.3°F | Resp 18 | Wt 241.2 lb

## 2023-05-25 DIAGNOSIS — Z23 Encounter for immunization: Secondary | ICD-10-CM

## 2023-05-25 DIAGNOSIS — E782 Mixed hyperlipidemia: Secondary | ICD-10-CM

## 2023-05-25 DIAGNOSIS — I10 Essential (primary) hypertension: Secondary | ICD-10-CM | POA: Diagnosis not present

## 2023-05-25 DIAGNOSIS — E1169 Type 2 diabetes mellitus with other specified complication: Secondary | ICD-10-CM

## 2023-05-25 DIAGNOSIS — Z7984 Long term (current) use of oral hypoglycemic drugs: Secondary | ICD-10-CM | POA: Diagnosis not present

## 2023-05-25 DIAGNOSIS — E785 Hyperlipidemia, unspecified: Secondary | ICD-10-CM | POA: Diagnosis not present

## 2023-05-25 DIAGNOSIS — I2089 Other forms of angina pectoris: Secondary | ICD-10-CM

## 2023-05-25 DIAGNOSIS — E1142 Type 2 diabetes mellitus with diabetic polyneuropathy: Secondary | ICD-10-CM

## 2023-05-25 MED ORDER — HYDROCHLOROTHIAZIDE 12.5 MG PO CAPS: 12.5000 mg | ORAL_CAPSULE | Freq: Every day | ORAL | 1 refills | Status: DC

## 2023-05-25 MED ORDER — OMEGA-3-ACID ETHYL ESTERS 1 G PO CAPS: 1.0000 g | ORAL_CAPSULE | Freq: Every day | ORAL | 3 refills | Status: DC

## 2023-05-25 MED ORDER — DILTIAZEM HCL ER COATED BEADS 180 MG PO CP24
ORAL_CAPSULE | ORAL | 3 refills | Status: DC
Start: 2023-05-25 — End: 2024-02-21

## 2023-05-25 MED ORDER — METFORMIN HCL 850 MG PO TABS: 850.0000 mg | ORAL_TABLET | Freq: Two times a day (BID) | ORAL | 3 refills | Status: DC

## 2023-05-25 MED ORDER — ATORVASTATIN CALCIUM 20 MG PO TABS: 20.0000 mg | ORAL_TABLET | Freq: Every day | ORAL | 3 refills | Status: DC

## 2023-05-25 NOTE — Assessment & Plan Note (Signed)
Repeat hgbA1c at 7.5% Random glucose at 108 Controlled with metformin, No adverse effect No glucose check at home Maintain med dose

## 2023-05-25 NOTE — Assessment & Plan Note (Signed)
Under the care of Dr. Erich Montane. Today he denies any chest pain or SOB with exertion. Current use of diltiazem, atorvastatin, lovaza and coQ10 Reports compliance with CPAP machine. LDL, hgbA1c, and BP at goal

## 2023-05-25 NOTE — Assessment & Plan Note (Signed)
BP at goal with olmesartan and HCTZ BP Readings from Last 3 Encounters:  05/25/23 122/63  05/13/23 114/60  05/06/23 129/68   Maintain med doses

## 2023-05-25 NOTE — Progress Notes (Signed)
Established Patient Visit  Patient: Andres Taylor   DOB: Feb 22, 1946   77 y.o. Male  MRN: [id number] Visit Date: 05/25/2023  Subjective:    Chief Complaint  Patient presents with   OFFICE VISIT     6 month follow up. PT is due for colonoscopy and shingles vaccine    Diabetes   Hypertension   Hyperlipidemia   HPI 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HCC) Under the care of Dr. Erich Montane. Today he denies any chest pain or SOB with exertion. Current use of diltiazem, atorvastatin, lovaza and coQ10 Reports compliance with CPAP machine. 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 Advised about proper foot care and use of appropriate foot wear at all time. Also advised to use knee high compression stocking for LE edema   Reviewed medical, surgical, and social history today  Medications: Outpatient Medications Prior to Visit  Medication Sig   albuterol (VENTOLIN HFA) 108 (90 Base) MCG/ACT inhaler Inhale 2 puffs into the lungs every 6 (six) hours as needed.   allopurinol (ZYLOPRIM) 100 MG tablet TAKE 1 TABLET BY MOUTH DAILY   aspirin EC 81 MG tablet Take 81 mg by mouth daily.   benzonatate (TESSALON) 200 MG capsule Take 1 capsule (200 mg total) by mouth 3 (three) times daily as needed.   budesonide-formoterol (SYMBICORT) 80-4.5 MCG/ACT inhaler Take 2 puffs first thing in am and then another 2 puffs about 12 hours later.   cetirizine (ZYRTEC) 10 MG chewable tablet Chew 10 mg by mouth daily.   Coenzyme Q-10 100 MG capsule  Take 100 mg by mouth 2 (two) times daily.   Continuous Glucose Sensor (DEXCOM G6 SENSOR) MISC Replace sensor every 10days   Continuous Glucose Transmitter (DEXCOM G6 TRANSMITTER) MISC Change transmitter every 90days   diclofenac Sodium (VOLTAREN) 1 % GEL Apply 2 g topically 4 (four) times daily.   furosemide (LASIX) 20 MG tablet TAKE 1 TABLET BY MOUTH AS NEEDED, DO NOT TAKE HCTZ THE DAY YOU USE MEDICATION AS NEEDED FOR SWELLING OR SHORTNESS OF BREATH   gabapentin (NEURONTIN) 300 MG capsule Take 300 mg by mouth 3 (three) times daily.   guaiFENesin (MUCINEX) 600 MG 12 hr tablet Take 600 mg by mouth daily.   ipratropium (ATROVENT) 0.06 % nasal spray USE 2 SPRAYS IN BOTH NOSTRILS IN THE MORNING AND AT BEDTIME   meloxicam (MOBIC) 15 MG tablet Take 15 mg by mouth daily.   methocarbamol (ROBAXIN) 500 MG tablet Take 1 tablet (500 mg total) by mouth 2 (two) times daily as needed for muscle spasms. TAKE 1 TABLET BY MOUTH EVERY 12  HOURS   montelukast (SINGULAIR) 10 MG tablet TAKE 1 TABLET BY MOUTH AT  BEDTIME   Multiple Vitamin (MULTIVITAMIN) tablet Take 1 tablet by mouth daily.   naproxen (NAPROSYN) 500 MG tablet Take 1 tablet (500 mg total) by mouth 2 (two) times daily with a meal.   olmesartan (BENICAR) 20 MG tablet TAKE 1 TABLET BY MOUTH  DAILY  senna-docusate (SENOKOT-S) 8.6-50 MG tablet Take 1 tablet by mouth at bedtime as needed for mild constipation.   [DISCONTINUED] atorvastatin (LIPITOR) 20 MG tablet TAKE 1 TABLET BY MOUTH DAILY   [DISCONTINUED] diltiazem (CARDIZEM CD) 180 MG 24 hr capsule TAKE 1 CAPSULE(180 MG) BY MOUTH DAILY   [DISCONTINUED] hydrochlorothiazide (MICROZIDE) 12.5 MG capsule Take 1 capsule (12.5 mg total) by mouth daily.   [DISCONTINUED] metFORMIN (GLUCOPHAGE) 850 MG tablet Take 1 tablet (850 mg total) by mouth 2 (two) times daily with a meal. Take 1bid   [DISCONTINUED] omega-3 acid ethyl esters (LOVAZA) 1 g capsule Take 1 capsule (1 g total) by mouth daily.   [DISCONTINUED]  Budeson-Glycopyrrol-Formoterol (BREZTRI AEROSPHERE) 160-9-4.8 MCG/ACT AERO Inhale 2 puffs into the lungs in the morning and at bedtime. (Patient not taking: Reported on 05/25/2023)   Facility-Administered Medications Prior to Visit  Medication Dose Route Frequency Provider   albuterol (PROVENTIL) (2.5 MG/3ML) 0.083% nebulizer solution 2.5 mg  2.5 mg Nebulization Once    Reviewed past medical and social history.   ROS per HPI above      Objective:  BP 122/63 (BP Location: Left Arm, Patient Position: Sitting, Cuff Size: Large)   Pulse 67   Temp (!) 97.3 F (36.3 C) (Temporal)   Resp 18   Wt 241 lb 3.2 oz (109.4 kg)   SpO2 96%   BMI 36.67 kg/m      Physical Exam Vitals and nursing note reviewed.  Cardiovascular:     Rate and Rhythm: Normal rate and regular rhythm.     Pulses: Normal pulses.     Heart sounds: Normal heart sounds.  Pulmonary:     Effort: Pulmonary effort is normal.     Breath sounds: Normal breath sounds.  Musculoskeletal:     Right lower leg: Edema present.     Left lower leg: Edema present.  Neurological:     Mental Status: He is alert and oriented to person, place, and time.     No results found for any visits on 05/25/23.    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 Advised about proper foot care and use of appropriate foot wear at all time. Also advised to use knee high compression stocking for LE edema      Relevant Medications   metFORMIN (GLUCOPHAGE) 850 MG tablet   atorvastatin (LIPITOR) 20 MG tablet   DM (diabetes mellitus) (HCC) - Primary    Repeat hgbA1c at 7.5% Random glucose at 108 Controlled with metformin, No adverse effect No glucose check at home Maintain med dose      Relevant Medications   metFORMIN (GLUCOPHAGE) 850 MG tablet   atorvastatin (LIPITOR) 20 MG tablet    HTN (hypertension), benign (Chronic)    BP at goal with olmesartan and HCTZ BP Readings from Last 3 Encounters:  05/25/23 122/63  05/13/23 114/60  05/06/23 129/68   Maintain med doses      Relevant Medications   omega-3 acid ethyl esters (LOVAZA) 1 g capsule   hydrochlorothiazide (MICROZIDE) 12.5 MG capsule   diltiazem (CARDIZEM CD) 180 MG 24 hr capsule   atorvastatin (LIPITOR) 20 MG tablet   Hyperlipidemia associated with type 2 diabetes mellitus (HCC)   Relevant Medications   omega-3 acid ethyl esters (LOVAZA) 1 g capsule   metFORMIN (GLUCOPHAGE) 850 MG tablet  hydrochlorothiazide (MICROZIDE) 12.5 MG capsule   diltiazem (CARDIZEM CD) 180 MG 24 hr capsule   atorvastatin (LIPITOR) 20 MG tablet   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HCC)    Under the care of Dr. Erich Montane. Today he denies any chest pain or SOB with exertion. Current use of diltiazem, atorvastatin, lovaza and coQ10 Reports compliance with CPAP machine. LDL, hgbA1c, and BP at goal      Relevant Medications   omega-3 acid ethyl esters (LOVAZA) 1 g capsule   hydrochlorothiazide (MICROZIDE) 12.5 MG capsule   diltiazem (CARDIZEM CD) 180 MG 24 hr capsule   atorvastatin (LIPITOR) 20 MG tablet   Other Visit Diagnoses     Immunization due       Relevant Orders   Flu Vaccine Trivalent High Dose (Fluad)   Mixed hyperlipidemia  (Chronic)      Relevant Medications   omega-3 acid ethyl esters (LOVAZA) 1 g capsule   hydrochlorothiazide (MICROZIDE) 12.5 MG capsule   diltiazem (CARDIZEM CD) 180 MG 24 hr capsule   atorvastatin (LIPITOR) 20 MG tablet      Return in about 6 months (around 11/22/2023) for HTN, DM, hyperlipidemia (fasting).     Alysia Penna, NP

## 2023-05-25 NOTE — Assessment & Plan Note (Addendum)
Abnormal foot exam due to LE edema and absent vibration sensation. Advised about proper foot care and use of appropriate foot wear at all time. Also advised to use knee high compression stocking for LE edema

## 2023-05-25 NOTE — Patient Instructions (Addendum)
Schedule appointment with [facility] for repeat colonoscopy [phone] due to history of pre-cancerous colon polyps. Get Shingrix vaccine from retail pharmacy.  Diabetes Mellitus and Foot Care Diabetes, also called diabetes mellitus, may cause problems with your feet and legs because of poor blood flow (circulation). Poor circulation may make your skin: Become thinner and drier. Break more easily. Heal more slowly. Peel and crack. You may also have nerve damage (neuropathy). This can cause decreased feeling in your legs and feet. This means that you may not notice minor injuries to your feet that could lead to more serious problems. Finding and treating problems early is the best way to prevent future foot problems. How to care for your feet Foot hygiene  Wash your feet daily with warm water and mild soap. Do not use hot water. Then, pat your feet and the areas between your toes until they are fully dry. Do not soak your feet. This can dry your skin. Trim your toenails straight across. Do not dig under them or around the cuticle. File the edges of your nails with an emery board or nail file. Apply a moisturizing lotion or petroleum jelly to the skin on your feet and to dry, brittle toenails. Use lotion that does not contain alcohol and is unscented. Do not apply lotion between your toes. Shoes and socks Wear clean socks or stockings every day. Make sure they are not too tight. Do not wear knee-high stockings. These may decrease blood flow to your legs. Wear shoes that fit well and have enough cushioning. Always look in your shoes before you put them on to be sure there are no objects inside. To break in new shoes, wear them for just a few hours a day. This prevents injuries on your feet. Wounds, scrapes, corns, and calluses  Check your feet daily for blisters, cuts, bruises, sores, and redness. If you cannot see the bottom of your feet, use a mirror or ask someone for help. Do not cut off  corns or calluses or try to remove them with medicine. If you find a minor scrape, cut, or break in the skin on your feet, keep it and the skin around it clean and dry. You may clean these areas with mild soap and water. Do not clean the area with peroxide, alcohol, or iodine. If you have a wound, scrape, corn, or callus on your foot, look at it several times a day to make sure it is healing and not infected. Check for: Redness, swelling, or pain. Fluid or blood. Warmth. Pus or a bad smell. General tips Do not cross your legs. This may decrease blood flow to your feet. Do not use heating pads or hot water bottles on your feet. They may burn your skin. If you have lost feeling in your feet or legs, you may not know this is happening until it is too late. Protect your feet from hot and cold by wearing shoes, such as at the beach or on hot pavement. Schedule a complete foot exam at least once a year or more often if you have foot problems. Report any cuts, sores, or bruises to your health care provider right away. Where to find more information American Diabetes Association: diabetes.org Association of Diabetes Care & Education Specialists: diabeteseducator.org Contact a health care provider if: You have a condition that increases your risk of infection, and you have any cuts, sores, or bruises on your feet. You have an injury that is not healing. You  have redness on your legs or feet. You feel burning or tingling in your legs or feet. You have pain or cramps in your legs and feet. Your legs or feet are numb. Your feet always feel cold. You have pain around any toenails. Get help right away if: You have a wound, scrape, corn, or callus on your foot and: You have signs of infection. You have a fever. You have a red line going up your leg. This information is not intended to replace advice given to you by your health care provider. Make sure you discuss any questions you have with your health  care provider. Document Revised: 12/31/2021 Document Reviewed: 12/31/2021 Elsevier Patient Education  2024 [employer].

## 2023-05-28 NOTE — Telephone Encounter (Signed)
Atc pt no answer, could not lvmm due to dial tone. Pt needs to call primary care due to protonix not being onhis med list

## 2023-05-28 NOTE — Telephone Encounter (Signed)
Mychart sent to pt to have him reach out to PCP.

## 2023-05-31 ENCOUNTER — Telehealth: Payer: Medicare Other | Admitting: Nurse Practitioner

## 2023-05-31 ENCOUNTER — Encounter: Payer: Self-pay | Admitting: Nurse Practitioner

## 2023-05-31 DIAGNOSIS — G4733 Obstructive sleep apnea (adult) (pediatric): Secondary | ICD-10-CM

## 2023-05-31 DIAGNOSIS — J302 Other seasonal allergic rhinitis: Secondary | ICD-10-CM | POA: Diagnosis not present

## 2023-05-31 DIAGNOSIS — J45991 Cough variant asthma: Secondary | ICD-10-CM | POA: Diagnosis not present

## 2023-05-31 MED ORDER — FLUTICASONE PROPIONATE 50 MCG/ACT NA SUSP: 2.0000 | Freq: Every day | NASAL | 2 refills | Status: AC

## 2023-05-31 NOTE — Progress Notes (Signed)
Patient ID: Andres Taylor, male     DOB: 1946/04/15, 77 y.o.      MRN: [id number]  No chief complaint on file.   Virtual Visit via Video Note  I connected with Andres Taylor on 05/31/23 at  2:00 PM EST by a video enabled telemedicine application and verified that I am speaking with the correct person using two identifiers.  Location: Patient: Home Provider: Office   I discussed the limitations of evaluation and management by telemedicine and the availability of in person appointments. The patient expressed understanding and agreed to proceed.  History of Present Illness: 77 year old male, former smoker followed for asthma, OSA on CPAP, upper airway cough. He is a patient of Dr. Trena Platt and last seen 05/13/2023 by Dr. Sherene Sires for acute visit. Past medical history significant for PH, allergic rhinitis, obesity, DM, benign RUL lung mass s/p resection 2015.   TESTS/EVENTS: 03/13/2019 PFT: FVC 47, FEV1 54, ratio 87, TLC 70, DLCO 63. Severe restriction. Positive bronchodilator response.  04/2019 echo: EF 60-65%, mildly elevated PASP 05/2019 HRCT chest: no evidence of fibrotic ILD. Bandlike scarring and atelectasis in the RLL with elevation of the right hemidiaphragm similar to CT 2015, consistent with post infectious process. S/p RUL wedge resection.  07/12/2019 HST: AHI 12.5/h 05/10/2023 CXR: stable elevation of right hemidiaphragm and borderline enlarged cardiac silhouette. Clear lungs. Mild peribronchial thickening. Stable linear atelectasis or scarring in RLL.  05/13/2023: OV with Dr. Sherene Sires for acute visit. Severe cough in setting of head cold to chest mid Oct 2024. Treated by PCP with amox and prednisone. Head congestion better; cough worse. Currently on Breo. Stopped due to DPI possibly exacerbating cough. Rx for Symbicort. Treat GERD.   05/31/2023: Today - follow up Patient presents today for follow up. He is feeling much better since his last visit. Cough has mostly  resolved. Breathing feels back to his normal. He does still have some throat clearing and feels like he gets some congestion in his throat, but even this has improved. He's doing sinus rinses twice a day, which seem to be helping. He believes the Symbicort works better than the [employer]. Hasn't had to use his rescue much. He denies wheezing, fevers, chills, leg swelling. He is taking claritin, singulair, and mucinex. He wears his CPAP nightly, which helps him sleep. He was supposed to have some imaging of his neck done about 2 weeks ago when he was sick. He couldn't lay flat on his stomach for the scan due to his breathing and cough so they had to reschedule it. He's hopeful he can tolerate it now that he's feeling better. Wants to know if he can use his albuterol beforehand, just in case. Goes to [place] in December to live until April or May.   No Known Allergies Immunization History  Administered Date(s) Administered   Fluad Quad(high Dose 65+) 07/03/2019, 04/09/2021, 04/27/2022   Fluad Trivalent(High Dose 65+) 05/25/2023   Influenza Whole 05/10/2007, 07/08/2010   Influenza,inj,Quad PF,6+ Mos 04/17/2018   Influenza-Unspecified 07/14/2011, 06/29/2013, 03/30/2014, 04/09/2015, 05/19/2016   PFIZER(Purple Top)SARS-COV-2 Vaccination 08/05/2019, 08/27/2019, 04/03/2020   Pneumococcal Conjugate-13 01/05/2012, 06/29/2013, 03/13/2014   Pneumococcal Polysaccharide-23 02/15/2013, 04/09/2015   Td 02/15/2013   Tdap 07/08/2010, 07/13/2013   Zoster, Live 07/21/2010, 02/15/2013   Past Medical History:  Diagnosis Date   Abdominal bloating 03/13/2019   Allergic rhinitis    Allergy    Arthritis    Asthma    Blood in urine    Cataract  forming    Chronic cough    CKD (chronic kidney disease), stage II    DDD (degenerative disc disease)    Diabetes mellitus (HCC)    DOE (dyspnea on exertion)    Eustachian tube dysfunction    Fatty liver    GERD (gastroesophageal reflux disease)    Glaucoma    Gout     Hard of hearing    bilateral    Heme positive stool 03/13/2019   History of cardiomegaly 05/16/2019   Noted on CXR   History of kidney stones    Hyperlipidemia    Hypertension    Inguinal hernia    Bilateral   Joint pain, knee    Leg edema    Lung tumor (benign), right    Right upper lobe lung mass   Obesity    OSA on CPAP    Pneumonitis    right lung   Pulmonary HTN (HCC) 04/18/2013   Restrictive lung disease    Sciatica    Spinal stenosis    Wears contact lenses    Wears glasses    Wears hearing aid in both ears     Tobacco History: Social History   Tobacco Use  Smoking Status Former   Current packs/day: 0.00   Average packs/day: 1.5 packs/day for 42.0 years (63.0 ttl pk-yrs)   Types: Cigarettes   Start date: 07/13/1960   Quit date: 07/13/2002   Years since quitting: 20.8  Smokeless Tobacco Former   Counseling given: Not Answered   Outpatient Medications Prior to Visit  Medication Sig Dispense Refill   albuterol (VENTOLIN HFA) 108 (90 Base) MCG/ACT inhaler Inhale 2 puffs into the lungs every 6 (six) hours as needed. 8 g 0   allopurinol (ZYLOPRIM) 100 MG tablet TAKE 1 TABLET BY MOUTH DAILY 90 tablet 3   aspirin EC 81 MG tablet Take 81 mg by mouth daily.     atorvastatin (LIPITOR) 20 MG tablet Take 1 tablet (20 mg total) by mouth daily. 90 tablet 3   benzonatate (TESSALON) 200 MG capsule Take 1 capsule (200 mg total) by mouth 3 (three) times daily as needed. 21 capsule 0   budesonide-formoterol (SYMBICORT) 80-4.5 MCG/ACT inhaler Take 2 puffs first thing in am and then another 2 puffs about 12 hours later. 1 each 12   cetirizine (ZYRTEC) 10 MG chewable tablet Chew 10 mg by mouth daily.     Coenzyme Q-10 100 MG capsule Take 100 mg by mouth 2 (two) times daily.     Continuous Glucose Sensor (DEXCOM G6 SENSOR) MISC Replace sensor every 10days 3 each 11   Continuous Glucose Transmitter (DEXCOM G6 TRANSMITTER) MISC Change transmitter every 90days 1 each 3   diclofenac  Sodium (VOLTAREN) 1 % GEL Apply 2 g topically 4 (four) times daily. 100 g 0   diltiazem (CARDIZEM CD) 180 MG 24 hr capsule TAKE 1 CAPSULE(180 MG) BY MOUTH DAILY 90 capsule 3   furosemide (LASIX) 20 MG tablet TAKE 1 TABLET BY MOUTH AS NEEDED, DO NOT TAKE HCTZ THE DAY YOU USE MEDICATION AS NEEDED FOR SWELLING OR SHORTNESS OF BREATH 20 tablet 0   gabapentin (NEURONTIN) 300 MG capsule Take 300 mg by mouth 3 (three) times daily.     guaiFENesin (MUCINEX) 600 MG 12 hr tablet Take 600 mg by mouth daily.     hydrochlorothiazide (MICROZIDE) 12.5 MG capsule Take 1 capsule (12.5 mg total) by mouth daily. 90 capsule 1   ipratropium (ATROVENT) 0.06 % nasal spray USE 2  SPRAYS IN BOTH NOSTRILS IN THE MORNING AND AT BEDTIME 75 mL 3   meloxicam (MOBIC) 15 MG tablet Take 15 mg by mouth daily.     metFORMIN (GLUCOPHAGE) 850 MG tablet Take 1 tablet (850 mg total) by mouth 2 (two) times daily with a meal. Take 1bid 180 tablet 3   methocarbamol (ROBAXIN) 500 MG tablet Take 1 tablet (500 mg total) by mouth 2 (two) times daily as needed for muscle spasms. TAKE 1 TABLET BY MOUTH EVERY 12  HOURS 180 tablet 1   montelukast (SINGULAIR) 10 MG tablet TAKE 1 TABLET BY MOUTH AT  BEDTIME 90 tablet 3   Multiple Vitamin (MULTIVITAMIN) tablet Take 1 tablet by mouth daily.     naproxen (NAPROSYN) 500 MG tablet Take 1 tablet (500 mg total) by mouth 2 (two) times daily with a meal. 30 tablet 0   olmesartan (BENICAR) 20 MG tablet TAKE 1 TABLET BY MOUTH  DAILY 90 tablet 3   omega-3 acid ethyl esters (LOVAZA) 1 g capsule Take 1 capsule (1 g total) by mouth daily. 90 capsule 3   senna-docusate (SENOKOT-S) 8.6-50 MG tablet Take 1 tablet by mouth at bedtime as needed for mild constipation. 30 tablet 0   Facility-Administered Medications Prior to Visit  Medication Dose Route Frequency Provider Last Rate Last Admin   albuterol (PROVENTIL) (2.5 MG/3ML) 0.083% nebulizer solution 2.5 mg  2.5 mg Nebulization Once          Review of Systems:    Constitutional: No weight loss or gain, night sweats, fevers, chills, fatigue, or lassitude. HEENT: No headaches, difficulty swallowing, tooth/dental problems, or sore throat. No sneezing, itching, ear ache. +nasal congestion, post nasal drip, throat clearing CV:  No chest pain, orthopnea, PND, swelling in lower extremities, anasarca, dizziness, palpitations, syncope Resp: +improved shortness of breath with exertion; resolved cough. No excess mucus or change in color of mucus. No hemoptysis. No wheezing.  No chest wall deformity GI:  No heartburn, indigestion GU: No dysuria, change in color of urine, urgency or frequency.   Skin: No rash, lesions, ulcerations MSK:  No joint pain or swelling. +chronic neck pain Neuro: No dizziness or lightheadedness.  Psych: No depression or anxiety. Mood stable.   Observations/Objective: Patient is well-developed, well-nourished in no acute distress. A&OX3. Resting comfortably at home. Unlabored breathing. Speech is clear and coherent with logical content.   Assessment and Plan: Cough variant asthma with UACS component Clinically improved with Symbicort. Advised him to utilize albuterol prior to scan. Suspect that he will tolerate since he's back to baseline. Encouraged to continue targeting postnasal drainage and minimize trigger exposure. Action plan in place.  Patient Instructions  Continue albuterol inhaler 2 puffs every 6 hours as needed for shortness of breath or wheezing. Notify if symptoms persist despite rescue inhaler/neb use.  Continue Symbicort 2 puffs Twice daily. Brush tongue and rinse mouth afterwards  Continue zyrtec 1 tab daily Continue singulair At bedtime  Continue atrovent nasal spray 2 sprays each nostril Twice daily Continue guaifenesin [phone] mg Twice daily for congestion  You can use your albuterol about 10-15 minutes before your scan and see if this helps  Try flonase nasal spray 2 sprays each nostril on days you feel more  congested. You want to do this 20-30 minutes after your saline rinse  Follow up in 3 months with Katie Sherron Mummert,NP via video visit and then Dr. Wynona Neat in person in 6 months once back from [place]. If symptoms do not improve or worsen, please  contact office for sooner follow up or seek emergency care.    Allergic rhinitis Add on intranasal steroid with flonase. Continue rinses, atrovent, daily antihistamine, and singulair  OSA on CPAP Excellent compliance per his report. Download not reviewed today. Will reassess at follow up.     I discussed the assessment and treatment plan with the patient. The patient was provided an opportunity to ask questions and all were answered. The patient agreed with the plan and demonstrated an understanding of the instructions.   The patient was advised to call back or seek an in-person evaluation if the symptoms worsen or if the condition fails to improve as anticipated.  I provided 31 minutes of non-face-to-face time during this encounter.   Noemi Chapel, NP

## 2023-05-31 NOTE — Assessment & Plan Note (Signed)
Clinically improved with Symbicort. Advised him to utilize albuterol prior to scan. Suspect that he will tolerate since he's back to baseline. Encouraged to continue targeting postnasal drainage and minimize trigger exposure. Action plan in place.  Patient Instructions  Continue albuterol inhaler 2 puffs every 6 hours as needed for shortness of breath or wheezing. Notify if symptoms persist despite rescue inhaler/neb use.  Continue Symbicort 2 puffs Twice daily. Brush tongue and rinse mouth afterwards  Continue zyrtec 1 tab daily Continue singulair At bedtime  Continue atrovent nasal spray 2 sprays each nostril Twice daily Continue guaifenesin [phone] mg Twice daily for congestion  You can use your albuterol about 10-15 minutes before your scan and see if this helps  Try flonase nasal spray 2 sprays each nostril on days you feel more congested. You want to do this 20-30 minutes after your saline rinse  Follow up in 3 months with Andres Shuntel Fishburn,Andres Taylor via video visit and then Andres Taylor in person in 6 months once back from [place]. If symptoms do not improve or worsen, please contact office for sooner follow up or seek emergency care.

## 2023-05-31 NOTE — Assessment & Plan Note (Signed)
Add on intranasal steroid with flonase. Continue rinses, atrovent, daily antihistamine, and singulair

## 2023-05-31 NOTE — Patient Instructions (Addendum)
Continue albuterol inhaler 2 puffs every 6 hours as needed for shortness of breath or wheezing. Notify if symptoms persist despite rescue inhaler/neb use.  Continue Symbicort 2 puffs Twice daily. Brush tongue and rinse mouth afterwards  Continue zyrtec 1 tab daily Continue singulair At bedtime  Continue atrovent nasal spray 2 sprays each nostril Twice daily Continue guaifenesin [phone] mg Twice daily for congestion  You can use your albuterol about 10-15 minutes before your scan and see if this helps  Try flonase nasal spray 2 sprays each nostril on days you feel more congested. You want to do this 20-30 minutes after your saline rinse  Follow up in 3 months with Katie Mechell Girgis,NP via video visit and then Dr. Wynona Neat in person in 6 months once back from [place]. If symptoms do not improve or worsen, please contact office for sooner follow up or seek emergency care.

## 2023-05-31 NOTE — Assessment & Plan Note (Signed)
Excellent compliance per his report. Download not reviewed today. Will reassess at follow up.

## 2023-06-02 DIAGNOSIS — M47812 Spondylosis without myelopathy or radiculopathy, cervical region: Secondary | ICD-10-CM | POA: Diagnosis not present

## 2023-06-04 ENCOUNTER — Encounter: Payer: Self-pay | Admitting: Nurse Practitioner

## 2023-06-04 ENCOUNTER — Ambulatory Visit (INDEPENDENT_AMBULATORY_CARE_PROVIDER_SITE_OTHER): Payer: Medicare Other | Admitting: Nurse Practitioner

## 2023-06-04 ENCOUNTER — Ambulatory Visit: Payer: Medicare Other | Admitting: Internal Medicine

## 2023-06-04 VITALS — BP 122/68 | HR 72 | Ht 68.0 in | Wt 242.1 lb

## 2023-06-04 DIAGNOSIS — Z860100 Personal history of colon polyps, unspecified: Secondary | ICD-10-CM | POA: Diagnosis not present

## 2023-06-04 DIAGNOSIS — K2270 Barrett's esophagus without dysplasia: Secondary | ICD-10-CM

## 2023-06-04 NOTE — Progress Notes (Signed)
Brief Narrative 77 y.o. yo male known remotely to Dr. Christella Hartigan ( 2020) with a past medical history not limited to HTN, DM, fatty liver, CKD, gout, asthma, , OSA on CPAP, benign RUL lung mass s/p resection 2015 obesity, colon polyps, Barrett's esophagus   ASSESSMENT    History of colon polyps. Two tubular adenomas ranging 6-10 mm in size removed in 2020.  Due for 3 year surveillance colonoscopy   Barrett's esophagus. ( Biopsies of irregular Z line on EGD 2020).  Due for surveillance EGD  See PMH for any additional medical history   PLAN   - The risks and benefits of colonoscopy with possible polypectomy / biopsies were discussed and the patient agrees to proceed. However, he decided to wait until May to have colonoscopy done. We are unable to schedule that far out. He will need to return for a pre-visit in April.  --Upon completion of this visit I realized patient also has a history of Barrett's esophagus and is due for surveillance. Therefore will need to be scheduled for EGD at time of colonoscopy. Will contact patient and make him aware. Also, appears not to be on a PPI Will confirm and start Omeprazole 20 mg daily   HPI   Chief complaint :  history of colon polyps  Andres Taylor has a history of adenomatous colon polyps and is due for surveillance colonoscopy. Since Dr. Christella Hartigan has retired patient would like Dr. Marina Goodell to assume his care. Dr. Marina Goodell takes care of his wife Andres Taylor. He was scheduled to see Dr. Marina Goodell today to discuss surveillance colonoscopy . Our office  cancelled the appointment but unfortunately patient didn't get the voicemail message. He showed up for the appt. I worked him in on my schedule.   Colonoscopy Jan 2021  two tubular adenomas removed ranging from 6-10 mm - A three follow up was recommended.  Andres Taylor has no GI complaints. He takes Senokot on occasion but generally bowel movements are normal. No blood in stool.     GI History / Studies   **May not be a  complete list of studies  Jan 2021 EGD and Colonoscopy for heme + stool    EGD - Irregular Z-line, biopsied. - Gastritis and duodenitis, biopsied. - The examination was otherwise normal. Colonoscopy - Two 6 to 10 mm polyps in the sigmoid colon and at the hepatic flexure, removed with a cold snare. Resected and retrieved. - Diverticulosis in the left colon. - The examination was otherwise normal on direct and retroflexion views.  Surgical [P], colon, sigmoid and hepatic flexure, polyp (2) - TUBULAR ADENOMA(S). - NO HIGH GRADE DYSPLASIA OR MALIGNANCY. 2. Surgical [P], gastric antrum and gastric body - ANTRAL AND OXYNTIC MUCOSA WITH SLIGHT CHRONIC INFLAMMATION. [place] NEGATIVE FOR HELICOBACTER PYLORI. - NO INTESTINAL METAPLASIA, DYSPLASIA, OR MALIGNANCY. 3. Surgical [P], duodenal bulb - DUODENAL MUCOSA WITH CHANGES CONSISTENT WITH PEPTIC INJURY. - NO DYSPLASIA OR MALIGNANCY. 4. Surgical [P], esophagus, GE junction - GASTROESOPHAGEAL MUCOSA WITH INTESTINAL METAPLASIA CONSISTENT WITH BARRETT'S. - NO DYSPLASIA OR MALIGNANCY.   Past Medical History:  Diagnosis Date   Abdominal bloating 03/13/2019   Allergic rhinitis    Allergy    Arthritis    Asthma    Blood in urine    Cataract    forming    Chronic cough    CKD (chronic kidney disease), stage II    DDD (degenerative disc disease)    Diabetes mellitus (HCC)    DOE (dyspnea on  exertion)    Eustachian tube dysfunction    Fatty liver    GERD (gastroesophageal reflux disease)    Glaucoma    Gout    Hard of hearing    bilateral    Heme positive stool 03/13/2019   History of cardiomegaly 05/16/2019   Noted on CXR   History of kidney stones    Hyperlipidemia    Hypertension    Inguinal hernia    Bilateral   Joint pain, knee    Leg edema    Lung tumor (benign), right    Right upper lobe lung mass   Obesity    OSA on CPAP    Pneumonitis    right lung   Pulmonary HTN (HCC) 04/18/2013   Restrictive lung disease     Sciatica    Spinal stenosis    Wears contact lenses    Wears glasses    Wears hearing aid in both ears    Past Surgical History:  Procedure Laterality Date   APPENDECTOMY  1976   cataract surgery Bilateral    march and april 2023   COLONOSCOPY  08/2010   COLONOSCOPY  08/07/2019   HEMORRHOID SURGERY  2001   INGUINAL HERNIA REPAIR Left 1948   as a baby 34 months old   INGUINAL HERNIA REPAIR Bilateral 09/21/2019   Procedure: LAPAROSCOPIC BILATERAL INGUINAL HERNIA REPAIR, LYSIS OF ADHESIONS;  Surgeon: Karie Soda, MD;  Location: [facility];  Service: General;  Laterality: Bilateral;   LEFT AND RIGHT HEART CATHETERIZATION WITH CORONARY ANGIOGRAM N/A 04/20/2013   Procedure: LEFT AND RIGHT HEART CATHETERIZATION WITH CORONARY ANGIOGRAM;  Surgeon: Marykay Lex, MD;  Location: [facility] CATH LAB;  Angiographically normal Coronary Arteries - L Dom Cx (OM1, small LPL1 then LPL2 & PDA) & wraparound LAD (D1-3 small).  Borderline CO/CI - 4.94/2.16 by both Fick & TD.  RAP ~14 mmHg, RVEDP 15 mmHg.  LVEDP 19 mmHg (->diastolic dysfunction) PCWP 16 mmHg, but PA Mean 25 mmHg   LITHOTRIPSY     x 2   LOBECTOMY Right    lumbar disectomy L4-5  1980   NM MYOVIEW LTD  06/15/2019    EF 70%.  No ischemia or infarction.  LOW RISK    TRANSTHORACIC ECHOCARDIOGRAM  04/21/2019   EF 60 to 65%.  Mild LVH.  Normal RV.  Normal atrial size.  Mild aortic sclerosis.  No stenosis. Lipomatous intra-atrial septum   UPPER GI ENDOSCOPY  08/07/2019   VIDEO ASSISTED THORACOSCOPY (VATS)/WEDGE RESECTION Right 08/30/2013   Procedure: VIDEO ASSISTED THORACOSCOPY (VATS)/WEDGE RESECTION;  Surgeon: Loreli Slot, MD;  Location: [facility] OR;  Service: Thoracic;  Laterality: Right;   Family History  Problem Relation Age of Onset   Leukemia Father    Breast cancer Maternal Grandmother    Cancer Maternal Grandmother        breast cancer   Stroke Maternal Grandmother    Hypertension Maternal Grandfather    Stroke  Maternal Grandfather    Stroke Mother    Cancer Son        Ewing's sarcoma   Heart failure Son        Related to Adriamycin toxicity.  Had 2 consecutive heart transplants   AAA (abdominal aortic aneurysm) Son    Stroke Paternal Grandmother    Colon cancer Neg Hx    Esophageal cancer Neg Hx    Colon polyps Neg Hx    Rectal cancer Neg Hx    Stomach cancer Neg Hx  Social History   Tobacco Use   Smoking status: Former    Current packs/day: 0.00    Average packs/day: 1.5 packs/day for 42.0 years (63.0 ttl pk-yrs)    Types: Cigarettes    Start date: 07/13/1960    Quit date: 07/13/2002    Years since quitting: 20.9   Smokeless tobacco: Former  Building services engineer status: Never Used  Substance Use Topics   Alcohol use: Yes    Alcohol/week: 3.0 standard drinks of alcohol    Types: 3 Cans of beer per week    Comment: occasionally   Drug use: No   Current Outpatient Medications  Medication Sig Dispense Refill   albuterol (VENTOLIN HFA) 108 (90 Base) MCG/ACT inhaler Inhale 2 puffs into the lungs every 6 (six) hours as needed. 8 g 0   allopurinol (ZYLOPRIM) 100 MG tablet TAKE 1 TABLET BY MOUTH DAILY 90 tablet 3   aspirin EC 81 MG tablet Take 81 mg by mouth daily.     atorvastatin (LIPITOR) 20 MG tablet Take 1 tablet (20 mg total) by mouth daily. 90 tablet 3   benzonatate (TESSALON) 200 MG capsule Take 1 capsule (200 mg total) by mouth 3 (three) times daily as needed. 21 capsule 0   budesonide-formoterol (SYMBICORT) 80-4.5 MCG/ACT inhaler Take 2 puffs first thing in am and then another 2 puffs about 12 hours later. 1 each 12   cetirizine (ZYRTEC) 10 MG chewable tablet Chew 10 mg by mouth daily.     Coenzyme Q-10 100 MG capsule Take 100 mg by mouth 2 (two) times daily.     Continuous Glucose Sensor (DEXCOM G6 SENSOR) MISC Replace sensor every 10days 3 each 11   Continuous Glucose Transmitter (DEXCOM G6 TRANSMITTER) MISC Change transmitter every 90days 1 each 3   diltiazem (CARDIZEM CD)  180 MG 24 hr capsule TAKE 1 CAPSULE(180 MG) BY MOUTH DAILY 90 capsule 3   fluticasone (FLONASE) 50 MCG/ACT nasal spray Place 2 sprays into both nostrils daily. 18.2 mL 2   furosemide (LASIX) 20 MG tablet TAKE 1 TABLET BY MOUTH AS NEEDED, DO NOT TAKE HCTZ THE DAY YOU USE MEDICATION AS NEEDED FOR SWELLING OR SHORTNESS OF BREATH 20 tablet 0   gabapentin (NEURONTIN) 300 MG capsule Take 300 mg by mouth 3 (three) times daily.     guaiFENesin (MUCINEX) 600 MG 12 hr tablet Take 600 mg by mouth daily.     hydrochlorothiazide (MICROZIDE) 12.5 MG capsule Take 1 capsule (12.5 mg total) by mouth daily. 90 capsule 1   ipratropium (ATROVENT) 0.06 % nasal spray USE 2 SPRAYS IN BOTH NOSTRILS IN THE MORNING AND AT BEDTIME 75 mL 3   meloxicam (MOBIC) 15 MG tablet Take 15 mg by mouth daily.     metFORMIN (GLUCOPHAGE) 850 MG tablet Take 1 tablet (850 mg total) by mouth 2 (two) times daily with a meal. Take 1bid 180 tablet 3   methocarbamol (ROBAXIN) 500 MG tablet Take 1 tablet (500 mg total) by mouth 2 (two) times daily as needed for muscle spasms. TAKE 1 TABLET BY MOUTH EVERY 12  HOURS 180 tablet 1   montelukast (SINGULAIR) 10 MG tablet TAKE 1 TABLET BY MOUTH AT  BEDTIME 90 tablet 3   Multiple Vitamin (MULTIVITAMIN) tablet Take 1 tablet by mouth daily.     olmesartan (BENICAR) 20 MG tablet TAKE 1 TABLET BY MOUTH  DAILY 90 tablet 3   omega-3 acid ethyl esters (LOVAZA) 1 g capsule Take 1 capsule (1 g total)  by mouth daily. 90 capsule 3   senna-docusate (SENOKOT-S) 8.6-50 MG tablet Take 1 tablet by mouth at bedtime as needed for mild constipation. 30 tablet 0   Current Facility-Administered Medications  Medication Dose Route Frequency Provider Last Rate Last Admin   albuterol (PROVENTIL) (2.5 MG/3ML) 0.083% nebulizer solution 2.5 mg  2.5 mg Nebulization Once        No Known Allergies   Review of Systems: Positive for chronic SHOB with exertion.  All other systems reviewed and negative except where noted in HPI.    Wt Readings from Last 3 Encounters:  06/04/23 242 lb 2 oz (109.8 kg)  05/25/23 241 lb 3.2 oz (109.4 kg)  05/13/23 246 lb 12.8 oz (111.9 kg)    Physical Exam:  BP 122/68   Pulse 72   Ht 5\' 8"  (1.727 m)   Wt 242 lb 2 oz (109.8 kg)   SpO2 96%   BMI 36.81 kg/m  Constitutional:  Pleasant male in no acute distress. Psychiatric:  Normal mood and affect. Behavior is normal. EENT: Pupils normal.  Conjunctivae are normal. No scleral icterus. Neck supple.  Cardiovascular: Normal rate, regular rhythm.  Pulmonary/chest: Effort normal and breath sounds normal. No wheezing, rales or rhonchi. Abdominal: Soft, nondistended, nontender. Bowel sounds active throughout. There are no masses palpable. No hepatomegaly. Neurological: Alert and oriented to person place and time. Extremities: 2+ BLE edema   Willette Cluster, NP  06/04/2023, 11:19 AM

## 2023-06-04 NOTE — Patient Instructions (Addendum)
It has been recommended to you by your physician that you have a(n) EGD and Colonoscopy completed. Per your request, we did not schedule the procedure(s) today. Please contact our office at [phone] should you decide to have the procedure completed. You will be scheduled for a pre-visit and procedure at that time.  ___________________________________________________________________________  Due to recent changes in healthcare laws, you may see the results of your imaging and laboratory studies on MyChart before your provider has had a chance to review them.  We understand that in some cases there may be results that are confusing or concerning to you. Not all laboratory results come back in the same time frame and the provider may be waiting for multiple results in order to interpret others.  Please give [place] 48 hours in order for your provider to thoroughly review all the results before contacting the office for clarification of your results.   Thank you for trusting me with your gastrointestinal care!   Ked him in on my schedule.   Patient has no Gi complaints. Specifically, no bowel changes or blood in stool.

## 2023-06-05 NOTE — Progress Notes (Signed)
Noted  

## 2023-06-22 ENCOUNTER — Other Ambulatory Visit: Payer: Self-pay

## 2023-06-23 ENCOUNTER — Other Ambulatory Visit: Payer: Self-pay

## 2023-06-23 MED ORDER — BUDESONIDE-FORMOTEROL FUMARATE 80-4.5 MCG/ACT IN AERO: INHALATION_SPRAY | RESPIRATORY_TRACT | 3 refills | Status: AC

## 2023-06-27 ENCOUNTER — Other Ambulatory Visit: Payer: Self-pay | Admitting: Nurse Practitioner

## 2023-06-27 DIAGNOSIS — M5416 Radiculopathy, lumbar region: Secondary | ICD-10-CM

## 2023-07-04 ENCOUNTER — Encounter: Payer: Self-pay | Admitting: Nurse Practitioner

## 2023-07-09 ENCOUNTER — Telehealth: Payer: Self-pay

## 2023-07-09 NOTE — Telephone Encounter (Signed)
Received fax from [facility] for Meloxicam. Please advise.   LOV: 05/25/2023 NOV: 11/22/2023

## 2023-07-13 DIAGNOSIS — E119 Type 2 diabetes mellitus without complications: Secondary | ICD-10-CM | POA: Diagnosis not present

## 2023-07-13 DIAGNOSIS — M47812 Spondylosis without myelopathy or radiculopathy, cervical region: Secondary | ICD-10-CM | POA: Diagnosis not present

## 2023-07-13 DIAGNOSIS — M542 Cervicalgia: Secondary | ICD-10-CM | POA: Diagnosis not present

## 2023-07-13 DIAGNOSIS — Z6837 Body mass index (BMI) 37.0-37.9, adult: Secondary | ICD-10-CM | POA: Diagnosis not present

## 2023-07-28 DIAGNOSIS — M542 Cervicalgia: Secondary | ICD-10-CM | POA: Diagnosis not present

## 2023-08-09 DIAGNOSIS — M47892 Other spondylosis, cervical region: Secondary | ICD-10-CM | POA: Diagnosis not present

## 2023-08-24 DIAGNOSIS — M47812 Spondylosis without myelopathy or radiculopathy, cervical region: Secondary | ICD-10-CM | POA: Diagnosis not present

## 2023-08-24 DIAGNOSIS — E119 Type 2 diabetes mellitus without complications: Secondary | ICD-10-CM | POA: Diagnosis not present

## 2023-08-24 DIAGNOSIS — I10 Essential (primary) hypertension: Secondary | ICD-10-CM | POA: Diagnosis not present

## 2023-08-27 ENCOUNTER — Other Ambulatory Visit: Payer: Self-pay | Admitting: Nurse Practitioner

## 2023-08-27 DIAGNOSIS — M1A9XX0 Chronic gout, unspecified, without tophus (tophi): Secondary | ICD-10-CM

## 2023-08-30 NOTE — Telephone Encounter (Signed)
Requesting: Allopurinol 100 MG Oral Tablet  Last Visit: 05/25/2023 Next Visit: 11/22/2023 Last Refill: 10/15/2022  Please Advise

## 2023-09-07 DIAGNOSIS — M47812 Spondylosis without myelopathy or radiculopathy, cervical region: Secondary | ICD-10-CM | POA: Diagnosis not present

## 2023-09-07 DIAGNOSIS — I119 Hypertensive heart disease without heart failure: Secondary | ICD-10-CM | POA: Diagnosis not present

## 2023-09-13 DIAGNOSIS — M25561 Pain in right knee: Secondary | ICD-10-CM | POA: Diagnosis not present

## 2023-09-21 DIAGNOSIS — M47812 Spondylosis without myelopathy or radiculopathy, cervical region: Secondary | ICD-10-CM | POA: Diagnosis not present

## 2023-09-21 DIAGNOSIS — I10 Essential (primary) hypertension: Secondary | ICD-10-CM | POA: Diagnosis not present

## 2023-10-07 DIAGNOSIS — M1611 Unilateral primary osteoarthritis, right hip: Secondary | ICD-10-CM | POA: Diagnosis not present

## 2023-10-07 DIAGNOSIS — M25561 Pain in right knee: Secondary | ICD-10-CM | POA: Diagnosis not present

## 2023-10-11 DIAGNOSIS — M47812 Spondylosis without myelopathy or radiculopathy, cervical region: Secondary | ICD-10-CM | POA: Diagnosis not present

## 2023-10-22 ENCOUNTER — Ambulatory Visit: Payer: Medicare Other

## 2023-10-22 DIAGNOSIS — Z0000 Encounter for general adult medical examination without abnormal findings: Secondary | ICD-10-CM | POA: Diagnosis not present

## 2023-10-22 NOTE — Progress Notes (Signed)
 Subjective:   Andres Taylor is a 78 y.o. who presents for a Medicare Wellness preventive visit.  Visit Complete: Virtual I connected with  [name] on 10/22/23 by a audio enabled telemedicine application and verified that I am speaking with the correct person using two identifiers.  Patient Location: Home  Provider Location: Office/Clinic  I discussed the limitations of evaluation and management by telemedicine. The patient expressed understanding and agreed to proceed.  Vital Signs: Because this visit was a virtual/telehealth visit, some criteria may be missing or patient reported. Any vitals not documented were not able to be obtained and vitals that have been documented are patient reported.  VideoError- Librarian, academic were attempted between this provider and patient, however failed, due to patient having technical difficulties OR patient did not have access to video capability.  We continued and completed visit with audio only.   Persons Participating in Visit: Patient.  AWV Questionnaire: Yes: Patient Medicare AWV questionnaire was completed by the patient on 10/21/2023; I have confirmed that all information answered by patient is correct and no changes since this date.  Cardiac Risk Factors include: advanced age (>37men, >40 women);diabetes mellitus;dyslipidemia;hypertension;male gender     Objective:    Today's Vitals   10/22/23 0846  PainSc: 5    There is no height or weight on file to calculate BMI.     10/22/2023    8:56 AM 11/25/2022   11:42 AM 10/19/2022    8:48 AM 10/14/2021    9:54 AM 01/14/2021   11:05 AM 10/08/2020    9:50 AM 09/21/2019    6:21 AM  Advanced Directives  Does Patient Have a Medical Advance Directive? Yes Yes Yes Yes Yes Yes --  Type of Estate agent of [place];Living will Healthcare Power of [place];Living will Healthcare Power of [place];Living will Healthcare Power of  [place];Living will Healthcare Power of [place];Living will Healthcare Power of [place];Living will   Copy of Healthcare Power of Attorney in Chart? No - copy requested  No - copy requested No - copy requested  No - copy requested     Current Medications (verified) Outpatient Encounter Medications as of 10/22/2023  Medication Sig   albuterol (VENTOLIN HFA) 108 (90 Base) MCG/ACT inhaler Inhale 2 puffs into the lungs every 6 (six) hours as needed.   allopurinol (ZYLOPRIM) 100 MG tablet TAKE 1 TABLET BY MOUTH DAILY   aspirin EC 81 MG tablet Take 81 mg by mouth daily. Takes couple times a week   budesonide-formoterol (SYMBICORT) 80-4.5 MCG/ACT inhaler Take 2 puffs first thing in am and then another 2 puffs about 12 hours later.   cetirizine (ZYRTEC) 10 MG chewable tablet Chew 10 mg by mouth daily.   Coenzyme Q-10 100 MG capsule Take 100 mg by mouth 2 (two) times daily.   gabapentin (NEURONTIN) 300 MG capsule Take 300 mg by mouth 3 (three) times daily.   guaiFENesin (MUCINEX) 600 MG 12 hr tablet Take 600 mg by mouth daily.   hydrochlorothiazide (MICROZIDE) 12.5 MG capsule Take 1 capsule (12.5 mg total) by mouth daily.   ipratropium (ATROVENT) 0.06 % nasal spray USE 2 SPRAYS IN BOTH NOSTRILS IN THE MORNING AND AT BEDTIME   meloxicam (MOBIC) 15 MG tablet Take 15 mg by mouth daily.   metFORMIN (GLUCOPHAGE) 850 MG tablet Take 1 tablet (850 mg total) by mouth 2 (two) times daily with a meal. Take 1bid   methocarbamol (ROBAXIN) 500 MG tablet TAKE 1 TABLET BY  MOUTH EVERY 12  HOURS AS NEEDED FOR MUSCLE  SPASMS   montelukast (SINGULAIR) 10 MG tablet TAKE 1 TABLET BY MOUTH AT  BEDTIME   Multiple Vitamin (MULTIVITAMIN) tablet Take 1 tablet by mouth daily.   olmesartan (BENICAR) 20 MG tablet TAKE 1 TABLET BY MOUTH  DAILY   omega-3 acid ethyl esters (LOVAZA) 1 g capsule Take 1 capsule (1 g total) by mouth daily.   atorvastatin (LIPITOR) 20 MG tablet Take 1 tablet (20 mg total) by mouth daily.   benzonatate  (TESSALON) 200 MG capsule Take 1 capsule (200 mg total) by mouth 3 (three) times daily as needed.   Continuous Glucose Sensor (DEXCOM G6 SENSOR) MISC Replace sensor every 10days (Patient not taking: Reported on 10/22/2023)   Continuous Glucose Transmitter (DEXCOM G6 TRANSMITTER) MISC Change transmitter every 90days (Patient not taking: Reported on 10/22/2023)   diltiazem (CARDIZEM CD) 180 MG 24 hr capsule TAKE 1 CAPSULE(180 MG) BY MOUTH DAILY   fluticasone (FLONASE) 50 MCG/ACT nasal spray Place 2 sprays into both nostrils daily.   furosemide (LASIX) 20 MG tablet TAKE 1 TABLET BY MOUTH AS NEEDED, DO NOT TAKE HCTZ THE DAY YOU USE MEDICATION AS NEEDED FOR SWELLING OR SHORTNESS OF BREATH (Patient not taking: Reported on 10/22/2023)   senna-docusate (SENOKOT-S) 8.6-50 MG tablet Take 1 tablet by mouth at bedtime as needed for mild constipation. (Patient not taking: Reported on 10/22/2023)   Facility-Administered Encounter Medications as of 10/22/2023  Medication   albuterol (PROVENTIL) (2.5 MG/3ML) 0.083% nebulizer solution 2.5 mg    Allergies (verified) Patient has no known allergies.   History: Past Medical History:  Diagnosis Date   Abdominal bloating 03/13/2019   Allergic rhinitis    Allergy    Arthritis    Asthma    Blood in urine    Cataract    forming    Chronic cough    CKD (chronic kidney disease), stage II    DDD (degenerative disc disease)    Diabetes mellitus (HCC)    DOE (dyspnea on exertion)    Eustachian tube dysfunction    Fatty liver    GERD (gastroesophageal reflux disease)    Glaucoma    Gout    Hard of hearing    bilateral    Heme positive stool 03/13/2019   History of cardiomegaly 05/16/2019   Noted on CXR   History of kidney stones    Hyperlipidemia    Hypertension    Inguinal hernia    Bilateral   Joint pain, knee    Leg edema    Lung tumor (benign), right    Right upper lobe lung mass   Obesity    OSA on CPAP    Pneumonitis    right lung   Pulmonary  HTN (HCC) 04/18/2013   Restrictive lung disease    Sciatica    Spinal stenosis    Wears contact lenses    Wears glasses    Wears hearing aid in both ears    Past Surgical History:  Procedure Laterality Date   APPENDECTOMY  1976   cataract surgery Bilateral    march and april 2023   COLONOSCOPY  08/2010   COLONOSCOPY  08/07/2019   HEMORRHOID SURGERY  2001   INGUINAL HERNIA REPAIR Left 1948   as a baby 66 months old   INGUINAL HERNIA REPAIR Bilateral 09/21/2019   Procedure: LAPAROSCOPIC BILATERAL INGUINAL HERNIA REPAIR, LYSIS OF ADHESIONS;  Surgeon: Karie Soda, MD;  Location: [facility] [place];  Service:  General;  Laterality: Bilateral;   LEFT AND RIGHT HEART CATHETERIZATION WITH CORONARY ANGIOGRAM N/A 04/20/2013   Procedure: LEFT AND RIGHT HEART CATHETERIZATION WITH CORONARY ANGIOGRAM;  Surgeon: Marykay Lex, MD;  Location: [facility] CATH LAB;  Angiographically normal Coronary Arteries - L Dom Cx (OM1, small LPL1 then LPL2 & PDA) & wraparound LAD (D1-3 small).  Borderline CO/CI - 4.94/2.16 by both Fick & TD.  RAP ~14 mmHg, RVEDP 15 mmHg.  LVEDP 19 mmHg (->diastolic dysfunction) PCWP 16 mmHg, but PA Mean 25 mmHg   LITHOTRIPSY     x 2   LOBECTOMY Right    lumbar disectomy L4-5  1980   NM MYOVIEW LTD  06/15/2019    EF 70%.  No ischemia or infarction.  LOW RISK    TRANSTHORACIC ECHOCARDIOGRAM  04/21/2019   EF 60 to 65%.  Mild LVH.  Normal RV.  Normal atrial size.  Mild aortic sclerosis.  No stenosis. Lipomatous intra-atrial septum   UPPER GI ENDOSCOPY  08/07/2019   VIDEO ASSISTED THORACOSCOPY (VATS)/WEDGE RESECTION Right 08/30/2013   Procedure: VIDEO ASSISTED THORACOSCOPY (VATS)/WEDGE RESECTION;  Surgeon: Loreli Slot, MD;  Location: [facility] OR;  Service: Thoracic;  Laterality: Right;   Family History  Problem Relation Age of Onset   Leukemia Father    Breast cancer Maternal Grandmother    Cancer Maternal Grandmother        breast cancer   Stroke Maternal Grandmother     Hypertension Maternal Grandfather    Stroke Maternal Grandfather    Stroke Mother    Cancer Son        Ewing's sarcoma   Heart failure Son        Related to Adriamycin toxicity.  Had 2 consecutive heart transplants   AAA (abdominal aortic aneurysm) Son    Stroke Paternal Grandmother    Colon cancer Neg Hx    Esophageal cancer Neg Hx    Colon polyps Neg Hx    Rectal cancer Neg Hx    Stomach cancer Neg Hx    Social History   Socioeconomic History   Marital status: Married    Spouse name: Not on file   Number of children: 3   Years of education: Not on file   Highest education level: 12th grade  Occupational History   Occupation: Real Environmental health practitioner: [employer]  Tobacco Use   Smoking status: Former    Current packs/day: 0.00    Average packs/day: 1.5 packs/day for 42.0 years (63.0 ttl pk-yrs)    Types: Cigarettes    Start date: 07/13/1960    Quit date: 07/13/2002    Years since quitting: 21.2   Smokeless tobacco: Former  Building services engineer status: Never Used  Substance and Sexual Activity   Alcohol use: Not Currently    Alcohol/week: 3.0 standard drinks of alcohol    Types: 3 Cans of beer per week    Comment: occasionally   Drug use: No   Sexual activity: Not on file  Other Topics Concern   Not on file  Social History Narrative   He and his first wife divorced in 20 14/2015.  He is now remarried and lives with his new wife.   He has 2 adult children-aged 83 and 28 (he had 3 children, 1 of his sons--actually the twin brother of 1 of the existing sons, died after complications of redo heart transplant following Adriamycin toxicity from leukemia as a child, he died  at age 55.     He works in Multimedia programmer estate broker.-Self-employed.   He quit smoking in 2004 after 50+ pack year smoking history.  He occasionally drinks alcohol.   Social Drivers of Corporate investment banker Strain: Low Risk  (10/21/2023)   Overall Financial Resource Strain  (CARDIA)    Difficulty of Paying Living Expenses: Not hard at all  Food Insecurity: No Food Insecurity (10/21/2023)   Hunger Vital Sign    Worried About Running Out of Food in the Last Year: Never true    Ran Out of Food in the Last Year: Never true  Transportation Needs: No Transportation Needs (10/21/2023)   PRAPARE - Administrator, Civil Service (Medical): No    Lack of Transportation (Non-Medical): No  Physical Activity: Insufficiently Active (10/21/2023)   Exercise Vital Sign    Days of Exercise per Week: 2 days    Minutes of Exercise per Session: 10 min  Stress: No Stress Concern Present (10/21/2023)   [employer] of Occupational Health - Occupational Stress Questionnaire    Feeling of Stress : Not at all  Social Connections: Socially Integrated (10/21/2023)   Social Connection and Isolation Panel [NHANES]    Frequency of Communication with Friends and Family: More than three times a week    Frequency of Social Gatherings with Friends and Family: More than three times a week    Attends Religious Services: More than 4 times per year    Active Member of [employer] or Organizations: Yes    Attends Engineer, structural: More than 4 times per year    Marital Status: Married    Tobacco Counseling Counseling given: Not Answered    Clinical Intake:  Pre-visit preparation completed: Yes  Pain : 0-10 Pain Score: 5  Pain Type: Chronic pain Pain Location: Neck Pain Descriptors / Indicators: Aching Pain Onset: More than a month ago Pain Frequency: Constant     Nutritional Risks: None Diabetes: Yes CBG done?: No Did pt. bring in CBG monitor from home?: No  Lab Results  Component Value Date   HGBA1C 7.5 (A) 05/06/2023   HGBA1C 7.7 (H) 11/20/2022   HGBA1C 6.7 (H) 10/07/2021     How often do you need to have someone help you when you read instructions, pamphlets, or other written materials from your doctor or pharmacy?: 1 - Never  Interpreter Needed?:  No  Information entered by :: NAllen LPN   Activities of Daily Living     10/21/2023    2:58 PM  In your present state of health, do you have any difficulty performing the following activities:  Hearing? 0  Vision? 0  Difficulty concentrating or making decisions? 0  Walking or climbing stairs? 1  Dressing or bathing? 0  Doing errands, shopping? 0  Preparing Food and eating ? N  Using the Toilet? N  In the past six months, have you accidently leaked urine? N  Do you have problems with loss of bowel control? N  Managing your Medications? N  Managing your Finances? N  Housekeeping or managing your Housekeeping? N    Patient Care Team: Nche, Bonna Gains, NP as PCP - General (Internal Medicine) Marykay Lex, MD as PCP - Cardiology (Cardiology) Marykay Lex, MD as Consulting Physician (Cardiology) Karie Soda, MD as Consulting Physician (General Surgery) Loreli Slot, MD as Consulting Physician (Cardiothoracic Surgery) Parrett, Virgel Bouquet, NP as Nurse Practitioner (Pulmonary Disease) Berkley Harvey, MD as Consulting Physician (  Ophthalmology)  Indicate any recent Medical Services you may have received from other than [facility] providers in the past year (date may be approximate).     Assessment:   This is a routine wellness examination for [facility].  Hearing/Vision screen Hearing Screening - Comments:: Denies hearing issues Vision Screening - Comments:: Regular eye exams. Dr. Sondra Barges   Goals Addressed             This Visit's Progress    Patient Stated       10/22/2023, not fall and be pain free       Depression Screen     10/22/2023    8:57 AM 05/25/2023    8:31 AM 05/06/2023    1:34 PM 10/19/2022    8:48 AM 04/15/2022    9:32 AM 10/14/2021    9:55 AM 10/14/2021    9:53 AM  PHQ 2/9 Scores  PHQ - 2 Score 0 0 0 0 0 0 0  PHQ- 9 Score 0          Fall Risk     10/21/2023    2:58 PM 05/25/2023    8:31 AM 05/06/2023    1:34 PM 10/14/2022    9:20 AM  10/14/2021    9:55 AM  Fall Risk   Falls in the past year? 0 0 0 0 0  Number falls in past yr:  0 0 0 0  Injury with Fall?  0 0 0 0  Risk for fall due to :  No Fall Risks No Fall Risks Impaired balance/gait;Impaired mobility;Medication side effect   Follow up  Falls evaluation completed Falls evaluation completed Falls prevention discussed;Education provided;Falls evaluation completed Falls evaluation completed    MEDICARE RISK AT HOME:  Medicare Risk at Home Any stairs in or around the home?: (Patient-Rptd) No Home free of loose throw rugs in walkways, pet beds, electrical cords, etc?: (Patient-Rptd) Yes Adequate lighting in your home to reduce risk of falls?: (Patient-Rptd) Yes Life alert?: (Patient-Rptd) No Use of a cane, walker or w/c?: (Patient-Rptd) Yes Grab bars in the bathroom?: (Patient-Rptd) Yes Shower chair or bench in shower?: (Patient-Rptd) Yes Elevated toilet seat or a handicapped toilet?: (Patient-Rptd) Yes  TIMED UP AND GO:  Was the test performed?  No  Cognitive Function: 6CIT completed        10/22/2023    8:57 AM 10/19/2022    8:49 AM  6CIT Screen  What Year? 0 points 0 points  What month? 0 points 0 points  What time? 0 points 0 points  Count back from 20 0 points 0 points  Months in reverse 0 points 0 points  Repeat phrase 0 points 0 points  Total Score 0 points 0 points    Immunizations Immunization History  Administered Date(s) Administered   Fluad Quad(high Dose 65+) 07/03/2019, 04/09/2021, 04/27/2022   Fluad Trivalent(High Dose 65+) 05/25/2023   Influenza Whole 05/10/2007, 07/08/2010   Influenza,inj,Quad PF,6+ Mos 04/17/2018   Influenza-Unspecified 07/14/2011, 06/29/2013, 03/30/2014, 04/09/2015, 05/19/2016   PFIZER(Purple Top)SARS-COV-2 Vaccination 08/05/2019, 08/27/2019, 04/03/2020   Pneumococcal Conjugate-13 01/05/2012, 06/29/2013, 03/13/2014   Pneumococcal Polysaccharide-23 02/15/2013, 04/09/2015   Td 02/15/2013   Tdap 07/08/2010,  07/13/2013   Zoster, Live 07/21/2010, 02/15/2013    Screening Tests Health Maintenance  Topic Date Due   Zoster Vaccines- Shingrix (1 of 2) 08/28/1994   Colonoscopy  08/06/2022   COVID-19 Vaccine (4 - 2024-25 season) 03/14/2023   DTaP/Tdap/Td (4 - Td or Tdap) 07/14/2023   Diabetic kidney evaluation - Urine ACR  11/20/2023   HEMOGLOBIN A1C  11/04/2023   OPHTHALMOLOGY EXAM  11/10/2023   Diabetic kidney evaluation - eGFR measurement  11/25/2023   INFLUENZA VACCINE  02/11/2024   FOOT EXAM  05/24/2024   Medicare Annual Wellness (AWV)  10/21/2024   Pneumonia Vaccine 28+ Years old  Completed   Hepatitis C Screening  Completed   HPV VACCINES  Aged Out   Meningococcal B Vaccine  Aged Out    Health Maintenance  Health Maintenance Due  Topic Date Due   Zoster Vaccines- Shingrix (1 of 2) 08/28/1994   Colonoscopy  08/06/2022   COVID-19 Vaccine (4 - 2024-25 season) 03/14/2023   DTaP/Tdap/Td (4 - Td or Tdap) 07/14/2023   Diabetic kidney evaluation - Urine ACR  11/20/2023   Health Maintenance Items Addressed: Due for TDAP and covid vaccine. Needs second shingrix. Patient will call to set up colonoscopy.  Additional Screening:  Vision Screening: Recommended annual ophthalmology exams for early detection of glaucoma and other disorders of the eye.  Dental Screening: Recommended annual dental exams for proper oral hygiene  Community Resource Referral / Chronic Care Management: CRR required this visit?  No   CCM required this visit?  No     Plan:     I have personally reviewed and noted the following in the patient's chart:   Medical and social history Use of alcohol, tobacco or illicit drugs  Current medications and supplements including opioid prescriptions. Patient is not currently taking opioid prescriptions. Functional ability and status Nutritional status Physical activity Advanced directives List of other physicians Hospitalizations, surgeries, and ER visits in  previous 12 months Vitals Screenings to include cognitive, depression, and falls Referrals and appointments  In addition, I have reviewed and discussed with patient certain preventive protocols, quality metrics, and best practice recommendations. A written personalized care plan for preventive services as well as general preventive health recommendations were provided to patient.     Barb Merino, LPN   [id number]   After Visit Summary: (MyChart) Due to this being a telephonic visit, the after visit summary with patients personalized plan was offered to patient via MyChart   Notes: Nothing significant to report at this time.

## 2023-10-22 NOTE — Patient Instructions (Signed)
 [name] , Thank you for taking time to come for your Medicare Wellness Visit. I appreciate your ongoing commitment to your health goals. Please review the following plan we discussed and let me know if I can assist you in the future.   Referrals/Orders/Follow-Ups/Clinician Recommendations: none  This is a list of the screening recommended for you and due dates:  Health Maintenance  Topic Date Due   Zoster (Shingles) Vaccine (1 of 2) 08/28/1994   Colon Cancer Screening  08/06/2022   COVID-19 Vaccine (4 - 2024-25 season) 03/14/2023   DTaP/Tdap/Td vaccine (4 - Td or Tdap) 07/14/2023   Yearly kidney health urinalysis for diabetes  11/20/2023   Hemoglobin A1C  11/04/2023   Eye exam for diabetics  11/10/2023   Yearly kidney function blood test for diabetes  11/25/2023   Flu Shot  02/11/2024   Complete foot exam   05/24/2024   Medicare Annual Wellness Visit  10/21/2024   Pneumonia Vaccine  Completed   Hepatitis C Screening  Completed   HPV Vaccine  Aged Out   Meningitis B Vaccine  Aged Out    Advanced directives: (Copy Requested) Please bring a copy of your health care power of attorney and living will to the office to be added to your chart at your convenience. You can mail to [facility] [address] [place], [place] [address] or email to [email or web address]  Next Medicare Annual Wellness Visit scheduled for next year: Yes  insert Preventive Care attachment Insert FALL PREVENTION attachment if needed

## 2023-10-28 DIAGNOSIS — M25551 Pain in right hip: Secondary | ICD-10-CM | POA: Diagnosis not present

## 2023-10-28 DIAGNOSIS — M1611 Unilateral primary osteoarthritis, right hip: Secondary | ICD-10-CM | POA: Diagnosis not present

## 2023-10-30 ENCOUNTER — Other Ambulatory Visit: Payer: Self-pay | Admitting: Nurse Practitioner

## 2023-11-02 NOTE — Telephone Encounter (Signed)
 Medication showing as historical. Please advise.

## 2023-11-03 ENCOUNTER — Encounter: Payer: Self-pay | Admitting: Internal Medicine

## 2023-11-16 DIAGNOSIS — H52203 Unspecified astigmatism, bilateral: Secondary | ICD-10-CM | POA: Diagnosis not present

## 2023-11-16 DIAGNOSIS — H43393 Other vitreous opacities, bilateral: Secondary | ICD-10-CM | POA: Diagnosis not present

## 2023-11-16 DIAGNOSIS — H524 Presbyopia: Secondary | ICD-10-CM | POA: Diagnosis not present

## 2023-11-16 DIAGNOSIS — H40213 Acute angle-closure glaucoma, bilateral: Secondary | ICD-10-CM | POA: Diagnosis not present

## 2023-11-16 DIAGNOSIS — H04123 Dry eye syndrome of bilateral lacrimal glands: Secondary | ICD-10-CM | POA: Diagnosis not present

## 2023-11-16 DIAGNOSIS — E119 Type 2 diabetes mellitus without complications: Secondary | ICD-10-CM | POA: Diagnosis not present

## 2023-11-16 DIAGNOSIS — Z7984 Long term (current) use of oral hypoglycemic drugs: Secondary | ICD-10-CM | POA: Diagnosis not present

## 2023-11-16 DIAGNOSIS — H26491 Other secondary cataract, right eye: Secondary | ICD-10-CM | POA: Diagnosis not present

## 2023-11-16 DIAGNOSIS — Z961 Presence of intraocular lens: Secondary | ICD-10-CM | POA: Diagnosis not present

## 2023-11-22 ENCOUNTER — Encounter: Payer: Self-pay | Admitting: Nurse Practitioner

## 2023-11-22 ENCOUNTER — Ambulatory Visit (INDEPENDENT_AMBULATORY_CARE_PROVIDER_SITE_OTHER): Payer: Medicare Other | Admitting: Nurse Practitioner

## 2023-11-22 VITALS — BP 130/70 | HR 90 | Temp 97.7°F | Ht 67.5 in | Wt 243.4 lb

## 2023-11-22 DIAGNOSIS — M5416 Radiculopathy, lumbar region: Secondary | ICD-10-CM | POA: Diagnosis not present

## 2023-11-22 DIAGNOSIS — R351 Nocturia: Secondary | ICD-10-CM | POA: Diagnosis not present

## 2023-11-22 DIAGNOSIS — E1142 Type 2 diabetes mellitus with diabetic polyneuropathy: Secondary | ICD-10-CM

## 2023-11-22 DIAGNOSIS — E1169 Type 2 diabetes mellitus with other specified complication: Secondary | ICD-10-CM | POA: Diagnosis not present

## 2023-11-22 DIAGNOSIS — Z7984 Long term (current) use of oral hypoglycemic drugs: Secondary | ICD-10-CM | POA: Diagnosis not present

## 2023-11-22 DIAGNOSIS — I10 Essential (primary) hypertension: Secondary | ICD-10-CM

## 2023-11-22 DIAGNOSIS — J42 Unspecified chronic bronchitis: Secondary | ICD-10-CM | POA: Insufficient documentation

## 2023-11-22 DIAGNOSIS — I700 Atherosclerosis of aorta: Secondary | ICD-10-CM | POA: Diagnosis not present

## 2023-11-22 DIAGNOSIS — N182 Chronic kidney disease, stage 2 (mild): Secondary | ICD-10-CM | POA: Diagnosis not present

## 2023-11-22 DIAGNOSIS — Z6837 Body mass index (BMI) 37.0-37.9, adult: Secondary | ICD-10-CM

## 2023-11-22 DIAGNOSIS — E790 Hyperuricemia without signs of inflammatory arthritis and tophaceous disease: Secondary | ICD-10-CM

## 2023-11-22 DIAGNOSIS — E669 Obesity, unspecified: Secondary | ICD-10-CM | POA: Insufficient documentation

## 2023-11-22 DIAGNOSIS — E66812 Obesity, class 2: Secondary | ICD-10-CM | POA: Diagnosis not present

## 2023-11-22 DIAGNOSIS — E785 Hyperlipidemia, unspecified: Secondary | ICD-10-CM

## 2023-11-22 LAB — COMPREHENSIVE METABOLIC PANEL WITH GFR
ALT: 15 U/L (ref 0–53)
AST: 14 U/L (ref 0–37)
Albumin: 4.3 g/dL (ref 3.5–5.2)
Alkaline Phosphatase: 44 U/L (ref 39–117)
BUN: 27 mg/dL — ABNORMAL HIGH (ref 6–23)
CO2: 30 meq/L (ref 19–32)
Calcium: 9.2 mg/dL (ref 8.4–10.5)
Chloride: 101 meq/L (ref 96–112)
Creatinine, Ser: 1.07 mg/dL (ref 0.40–1.50)
GFR: 66.13 mL/min (ref >60.00)
Glucose, Bld: 144 mg/dL — ABNORMAL HIGH (ref 70–99)
Potassium: 4.4 meq/L (ref 3.5–5.1)
Sodium: 140 meq/L (ref 135–145)
Total Bilirubin: 0.5 mg/dL (ref 0.2–1.2)
Total Protein: 6.5 g/dL (ref 6.0–8.3)

## 2023-11-22 LAB — LIPID PANEL
Cholesterol: 176 mg/dL (ref 0–200)
HDL: 39.8 mg/dL (ref >39.00)
LDL Cholesterol: 96 mg/dL (ref 0–99)
NonHDL: 136.19
Total CHOL/HDL Ratio: 4
Triglycerides: 202 mg/dL — ABNORMAL HIGH (ref 0.0–149.0)
VLDL: 40.4 mg/dL — ABNORMAL HIGH (ref 0.0–40.0)

## 2023-11-22 LAB — URIC ACID: Uric Acid, Serum: 7.1 mg/dL (ref 4.0–7.8)

## 2023-11-22 LAB — MICROALBUMIN / CREATININE URINE RATIO
Creatinine,U: 57.5 mg/dL
Microalb Creat Ratio: UNDETERMINED mg/g (ref 0.0–30.0)
Microalb, Ur: <0.7 mg/dL

## 2023-11-22 LAB — HEMOGLOBIN A1C: Hgb A1c MFr Bld: 7.3 % — ABNORMAL HIGH (ref 4.6–6.5)

## 2023-11-22 LAB — TSH: TSH: 1.38 u[IU]/mL (ref 0.35–5.50)

## 2023-11-22 LAB — PSA: PSA: 1.54 ng/mL (ref 0.10–4.00)

## 2023-11-22 MED ORDER — METHOCARBAMOL 750 MG PO TABS: 750.0000 mg | ORAL_TABLET | Freq: Three times a day (TID) | ORAL | 5 refills | Status: DC

## 2023-11-22 MED ORDER — GABAPENTIN 300 MG PO CAPS: 300.0000 mg | ORAL_CAPSULE | Freq: Every day | ORAL | 3 refills | Status: DC

## 2023-11-22 NOTE — Assessment & Plan Note (Signed)
 Repeat lipid panel BP at goal Controlled DIABETES Current use of atorvastatin 

## 2023-11-22 NOTE — Assessment & Plan Note (Signed)
 Repeat CMP and urine microalbumin today Chronic ankle edema, improves with elevation and prn use of furosemide  He is aware not to use HCTZ, if he has to take furosemide 

## 2023-11-22 NOTE — Assessment & Plan Note (Addendum)
 Repeat hgbA1c, CMP, lipid panel, and UACr Previous hgbA1c at 7.5% and normal UACs LDL at goal with atorvastatin  BP at goal Current use of metformin . UTD with eye exam-report requested  We discussed use of GLP-1RA, its possible side effects, advantages, and contraindications. He opted to maintain metformin  dose at this time. F/up in 3months

## 2023-11-22 NOTE — Assessment & Plan Note (Signed)
 LDL at goal with atorvastatin  Repeat lipid panel and CMP

## 2023-11-22 NOTE — Assessment & Plan Note (Signed)
 Limited exercise due to advanced degenerative disc and joint disease. He does not want to use GLP-1RA at  this time. Encouraged to maintain a heart healthy diet Wt Readings from Last 3 Encounters:  11/22/23 243 lb 6.4 oz (110.4 kg)  06/04/23 242 lb 2 oz (109.8 kg)  05/25/23 241 lb 3.2 oz (109.4 kg)

## 2023-11-22 NOTE — Assessment & Plan Note (Signed)
 No acute exacerbation Current use of symbicort  and montelukast 

## 2023-11-22 NOTE — Assessment & Plan Note (Signed)
 Unable to use gabapentin  300mg  in daytime due to daytime somnolence. Advised to maintain hs dose only

## 2023-11-22 NOTE — Patient Instructions (Signed)
 Go to lab Consider use of weekly mounjaro or ozempic injection Schedule appointment repeat colonoscopy Maintain Heart healthy diet and daily exercise. Maintain current medications.

## 2023-11-22 NOTE — Assessment & Plan Note (Addendum)
 Last took diltiazem  08/2023 BP at goal with benicar  and hydrochlorothiazide . BP Readings from Last 3 Encounters:  11/22/23 130/70  06/04/23 122/68  05/25/23 122/63    Maintain med doses Continue to hold diltiazem  at this time Repeat CMP F/up in 3months

## 2023-11-22 NOTE — Progress Notes (Signed)
 Established Patient Visit  Patient: Andres Taylor   DOB: 1945/11/13   78 y.o. Male  MRN: [id number] Visit Date: 11/22/2023  Subjective:     Chief Complaint  Patient presents with   Follow-up   Medication Management    Talk about mobic    HPI HTN (hypertension), benign Last took diltiazem  08/2023 BP at goal with benicar  and hydrochlorothiazide . 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 Advised to maintain hs dose only  DM (diabetes mellitus) (HCC) Repeat hgbA1c, CMP, lipid panel, and UACr Previous hgbA1c at 7.5% and normal UACs LDL at goal with atorvastatin  BP at goal Current use of metformin . UTD with eye exam-report requested  We discussed use of GLP-1RA, its possible side effects, advantages, and contraindications. He opted to maintain metformin  dose at this time. F/up in 3months  Hyperlipidemia associated with type 2 diabetes mellitus (HCC) LDL at goal with atorvastatin  Repeat lipid panel and CMP  CKD (chronic kidney disease) stage 2, GFR 60-89 ml/min Repeat CMP and urine microalbumin today Chronic ankle edema, improves with elevation and prn use of furosemide  He is aware not to use HCTZ, if he has to take furosemide   Hyperuricemia No acute gout exacerbation in last 6months Current use of allopurinol , maintain dose Repeat uric acid and CMP  Obesity Limited exercise due to advanced degenerative disc and joint disease. He does not  want to use GLP-1RA at  this time. Encouraged to maintain a heart healthy diet Wt Readings from Last 3 Encounters:  11/22/23 243 lb 6.4 oz (110.4 kg)  06/04/23 242 lb 2 oz (109.8 kg)  05/25/23 241 lb 3.2 oz (109.4 kg)     BP Readings from Last 3 Encounters:  11/22/23 130/70  06/04/23 122/68  05/25/23 122/63    Wt Readings from Last 3 Encounters:  11/22/23 243 lb 6.4 oz (110.4 kg)  06/04/23 242 lb 2 oz (109.8 kg)  05/25/23 241 lb 3.2 oz (109.4 kg)    Reviewed medical, surgical, and social history today  Medications: Outpatient Medications Prior to Visit  Medication Sig Note   albuterol  (VENTOLIN  HFA) 108 (90 Base) MCG/ACT inhaler Inhale 2 puffs into the lungs every 6 (six) hours as needed.    allopurinol  (ZYLOPRIM ) 100 MG tablet TAKE 1 TABLET BY MOUTH DAILY    aspirin  EC 81 MG tablet Take 81 mg by mouth daily. Takes couple times a week    budesonide -formoterol  (SYMBICORT ) 80-4.5 MCG/ACT inhaler Take 2 puffs first thing in am and then another 2 puffs about 12 hours later.    cetirizine (ZYRTEC) 10 MG chewable tablet Chew 10 mg by mouth daily.    guaiFENesin  (MUCINEX ) 600 MG 12 hr tablet Take 600 mg by mouth 2 (two) times daily.    hydrochlorothiazide  (MICROZIDE ) 12.5 MG capsule Take 1 capsule (12.5 mg total) by mouth daily.    meloxicam (MOBIC) 15 MG tablet Take 15 mg by mouth daily.    metFORMIN  (GLUCOPHAGE ) 850 MG tablet Take  1 tablet (850 mg total) by mouth 2 (two) times daily with a meal. Take 1bid    Multiple Vitamin (MULTIVITAMIN) tablet Take 1 tablet by mouth daily.    olmesartan  (BENICAR ) 20 MG tablet TAKE 1 TABLET BY MOUTH  DAILY    omega-3 acid ethyl esters (LOVAZA ) 1 g capsule Take 1 capsule (1 g total) by mouth daily.    [DISCONTINUED] furosemide  (LASIX ) 20 MG tablet TAKE 1 TABLET BY MOUTH AS NEEDED, DO NOT TAKE HCTZ THE DAY YOU USE MEDICATION AS NEEDED FOR SWELLING OR SHORTNESS OF BREATH    [DISCONTINUED] methocarbamol  (ROBAXIN ) 500 MG tablet TAKE 1 TABLET BY MOUTH  EVERY 12  HOURS AS NEEDED FOR MUSCLE  SPASMS    atorvastatin  (LIPITOR) 20 MG tablet Take 1 tablet (20 mg total) by mouth daily.    Coenzyme Q-10 100 MG capsule Take 100 mg by mouth 2 (two) times daily.    diltiazem  (CARDIZEM  CD) 180 MG 24 hr capsule TAKE 1 CAPSULE(180 MG) BY MOUTH DAILY    fluticasone  (FLONASE ) 50 MCG/ACT nasal spray Place 2 sprays into both nostrils daily.    ipratropium (ATROVENT ) 0.06 % nasal spray USE 2 SPRAYS IN BOTH NOSTRILS IN THE MORNING AND AT BEDTIME 11/22/2023: Not sure   [DISCONTINUED] benzonatate  (TESSALON ) 200 MG capsule Take 1 capsule (200 mg total) by mouth 3 (three) times daily as needed.    [DISCONTINUED] Continuous Glucose Sensor (DEXCOM G6 SENSOR) MISC Replace sensor every 10days (Patient not taking: Reported on 11/22/2023)    [DISCONTINUED] Continuous Glucose Transmitter (DEXCOM G6 TRANSMITTER) MISC Change transmitter every 90days (Patient not taking: Reported on 11/22/2023)    [DISCONTINUED] gabapentin  (NEURONTIN ) 300 MG capsule TAKE ONE CAPSULE BY MOUTH THREE TIMES A DAY (Patient taking differently: Take 300 mg by mouth daily in the afternoon.)    [DISCONTINUED] montelukast  (SINGULAIR ) 10 MG tablet TAKE 1 TABLET BY MOUTH AT  BEDTIME (Patient not taking: Reported on 11/22/2023)    [DISCONTINUED] senna-docusate (SENOKOT-S) 8.6-50 MG tablet Take 1 tablet by mouth at bedtime as needed for mild constipation. (Patient not taking: Reported on 10/22/2023)    Facility-Administered Medications Prior to Visit  Medication Dose Route Frequency Provider   albuterol  (PROVENTIL ) (2.5 MG/3ML) 0.083% nebulizer solution 2.5 mg  2.5 mg Nebulization Once    Reviewed past medical and social history.   ROS per HPI above      Objective:  BP 130/70 (BP Location: Left Arm, Patient Position: Sitting, Cuff Size: Large)   Pulse 90   Temp 97.7 F (36.5 C) (Temporal)   Ht 5' 7.5" (1.715 m)   Wt 243 lb 6.4 oz (110.4 kg)   SpO2 97%   BMI 37.56 kg/m      Physical Exam Vitals and  nursing note reviewed.  Cardiovascular:     Rate and Rhythm: Normal rate and regular rhythm.     Pulses: Normal pulses.     Heart sounds: Normal heart sounds.  Pulmonary:     Effort: Pulmonary effort is normal.     Breath sounds: Normal breath sounds.  Musculoskeletal:     Right lower leg: Edema present.     Left lower leg: Edema present.  Skin:    Findings: No erythema.  Neurological:     Mental Status: He is alert and oriented to person, place, and time.     No results found for any visits on 11/22/23.    Assessment & Plan:    Problem List Items Addressed This Visit     Aortic  atherosclerosis (HCC)   Repeat lipid panel BP at goal Controlled DIABETES Current use of atorvastatin       Chronic bronchitis, unspecified chronic bronchitis type (HCC) - Primary   No acute exacerbation Current use of symbicort  and montelukast       CKD (chronic kidney disease) stage 2, GFR 60-89 ml/min   Repeat CMP and urine microalbumin today Chronic ankle edema, improves with elevation and prn use of furosemide  He is aware not to use HCTZ, if he has to take furosemide       Relevant Orders   Microalbumin / creatinine urine ratio   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 Advised to maintain hs dose only      DM (diabetes mellitus) (HCC)   Repeat hgbA1c, CMP, lipid panel, and UACr Previous hgbA1c at 7.5% and normal UACs LDL at goal with atorvastatin  BP at goal Current use of metformin . UTD with eye exam-report requested  We discussed use of GLP-1RA, its possible side effects, advantages, and contraindications. He opted to maintain metformin  dose at this time. F/up in 3months      Relevant Orders   Hemoglobin A1c   Comprehensive metabolic panel with GFR   HTN (hypertension), benign (Chronic)   Last took diltiazem  08/2023 BP at goal with benicar  and hydrochlorothiazide . BP Readings  from Last 3 Encounters:  11/22/23 130/70  06/04/23 122/68  05/25/23 122/63    Maintain med doses Continue to hold diltiazem  at this time Repeat CMP F/up in 3months      Relevant Orders   Comprehensive metabolic panel with GFR   TSH   Hyperlipidemia associated with type 2 diabetes mellitus (HCC)   LDL at goal with atorvastatin  Repeat lipid panel and CMP      Relevant Orders   Comprehensive metabolic panel with GFR   Lipid panel   Hyperuricemia   No acute gout exacerbation in last 6months Current use of allopurinol , maintain dose Repeat uric acid and CMP      Relevant Orders   Uric acid   Obesity   Limited exercise due to advanced degenerative disc and joint disease. He does not want to use GLP-1RA at  this time. Encouraged to maintain a heart healthy diet Wt Readings from Last 3 Encounters:  11/22/23 243 lb 6.4 oz (110.4 kg)  06/04/23 242 lb 2 oz (109.8 kg)  05/25/23 241 lb 3.2 oz (109.4 kg)         Relevant Orders   TSH   Other Visit Diagnoses       Lumbar radiculopathy, right       Relevant Medications   methocarbamol  (ROBAXIN ) 750 MG tablet   gabapentin  (NEURONTIN ) 300 MG capsule     Nocturia       Relevant Orders   PSA      Return in about 3 months (around 02/22/2024) for HTN, DM, hyperlipidemia (fasting).     Kathrene Parents, NP

## 2023-11-22 NOTE — Assessment & Plan Note (Signed)
No acute gout exacerbation in last 6months Current use of allopurinol, maintain dose Repeat uric acid and CMP: normal

## 2023-11-23 ENCOUNTER — Encounter: Payer: Self-pay | Admitting: Nurse Practitioner

## 2023-11-23 ENCOUNTER — Telehealth: Payer: Self-pay

## 2023-11-23 ENCOUNTER — Ambulatory Visit: Payer: Self-pay | Admitting: Nurse Practitioner

## 2023-11-23 DIAGNOSIS — E1169 Type 2 diabetes mellitus with other specified complication: Secondary | ICD-10-CM

## 2023-11-23 DIAGNOSIS — E785 Hyperlipidemia, unspecified: Secondary | ICD-10-CM

## 2023-11-23 DIAGNOSIS — E782 Mixed hyperlipidemia: Secondary | ICD-10-CM

## 2023-11-23 MED ORDER — GLIPIZIDE ER 5 MG PO TB24: 5.0000 mg | ORAL_TABLET | Freq: Every day | ORAL | 1 refills | Status: DC

## 2023-11-23 MED ORDER — ATORVASTATIN CALCIUM 40 MG PO TABS: 40.0000 mg | ORAL_TABLET | Freq: Every evening | ORAL | 3 refills | Status: AC

## 2023-11-23 NOTE — Telephone Encounter (Addendum)
 Called and left a voice message per DPR on file for the patient asking to give me a call back at the office at [phone]

## 2023-11-23 NOTE — Telephone Encounter (Signed)
 In preparing patient's chart for pre-visit appointment prior to upper endoscopy and colonoscopy scheduled for 12/21/23, RN observed that patient has a history of difficult airway for intubation. Patient had both procedures done at [facility] in January, 2021, but note regarding difficult airway was noted in March, 2021.   RN will communicate with Edison Gore, CRNA to see if patient is still able to have his procedures completed at [facility] or if he will need to have his procedures done at the hospital.

## 2023-12-02 ENCOUNTER — Telehealth: Payer: Self-pay | Admitting: *Deleted

## 2023-12-02 ENCOUNTER — Other Ambulatory Visit: Payer: Self-pay | Admitting: *Deleted

## 2023-12-02 DIAGNOSIS — K2270 Barrett's esophagus without dysplasia: Secondary | ICD-10-CM

## 2023-12-02 DIAGNOSIS — Z860100 Personal history of colon polyps, unspecified: Secondary | ICD-10-CM

## 2023-12-02 NOTE — Telephone Encounter (Signed)
 Routine surveillance colonoscopy at the hospital

## 2023-12-02 NOTE — Telephone Encounter (Signed)
 Dr. Elvin Hammer, It is documented that this patient is a difficult airway and Rogena Class sent another telephone encounter today stating this as well.  He stated this patient's procedure would need to be done at the hospital.  Please advise if this patient needs to be scheduled for an OV or can he be a direct? Please/thank you Bre, PV RN

## 2023-12-02 NOTE — Telephone Encounter (Signed)
 Patient has been scheduled for endoscopy/colonoscopy at [facility] with Dr Elvin Hammer on 02/23/24 at 830 am, 7:00 am arrival.   [facility] endo/colon originally scheduled for 12/21/23 has been cancelled.

## 2023-12-02 NOTE — Telephone Encounter (Signed)
 Please advise PV if the patient's PV is rescheduled or if the patient is able to be scheduled at the hospital and the PV appt is able to stand as is Please/thank you Bre, PV RN

## 2023-12-02 NOTE — Telephone Encounter (Signed)
 Team,  This pt is a documented difficult intubation and his procedure will need to be done at the hospital.   Thanks,  Cathryn Cobb

## 2023-12-02 NOTE — Telephone Encounter (Signed)
 Spoke with the patient regarding change in location for for pre visit. his colonoscopy.  He will proceed with pre visit Tuesday 12/07/23 in preparation for colonoscopy at [facility].  Updated new instructions

## 2023-12-07 ENCOUNTER — Encounter

## 2023-12-07 NOTE — Telephone Encounter (Signed)
 Please take note of this information as this patient is scheduled with you today.

## 2023-12-10 ENCOUNTER — Other Ambulatory Visit: Payer: Self-pay | Admitting: Nurse Practitioner

## 2023-12-10 DIAGNOSIS — I10 Essential (primary) hypertension: Secondary | ICD-10-CM

## 2023-12-10 DIAGNOSIS — M1A9XX0 Chronic gout, unspecified, without tophus (tophi): Secondary | ICD-10-CM

## 2023-12-14 DIAGNOSIS — M47812 Spondylosis without myelopathy or radiculopathy, cervical region: Secondary | ICD-10-CM | POA: Diagnosis not present

## 2023-12-21 ENCOUNTER — Encounter: Admitting: Internal Medicine

## 2023-12-23 DIAGNOSIS — L218 Other seborrheic dermatitis: Secondary | ICD-10-CM | POA: Diagnosis not present

## 2023-12-23 DIAGNOSIS — D485 Neoplasm of uncertain behavior of skin: Secondary | ICD-10-CM | POA: Diagnosis not present

## 2023-12-23 DIAGNOSIS — D2271 Melanocytic nevi of right lower limb, including hip: Secondary | ICD-10-CM | POA: Diagnosis not present

## 2023-12-23 DIAGNOSIS — L570 Actinic keratosis: Secondary | ICD-10-CM | POA: Diagnosis not present

## 2023-12-23 DIAGNOSIS — D0339 Melanoma in situ of other parts of face: Secondary | ICD-10-CM | POA: Diagnosis not present

## 2023-12-23 DIAGNOSIS — D225 Melanocytic nevi of trunk: Secondary | ICD-10-CM | POA: Diagnosis not present

## 2023-12-23 DIAGNOSIS — D235 Other benign neoplasm of skin of trunk: Secondary | ICD-10-CM | POA: Diagnosis not present

## 2023-12-23 DIAGNOSIS — L908 Other atrophic disorders of skin: Secondary | ICD-10-CM | POA: Diagnosis not present

## 2023-12-23 DIAGNOSIS — D2272 Melanocytic nevi of left lower limb, including hip: Secondary | ICD-10-CM | POA: Diagnosis not present

## 2023-12-23 DIAGNOSIS — Z85828 Personal history of other malignant neoplasm of skin: Secondary | ICD-10-CM | POA: Diagnosis not present

## 2024-01-13 DIAGNOSIS — M47812 Spondylosis without myelopathy or radiculopathy, cervical region: Secondary | ICD-10-CM | POA: Diagnosis not present

## 2024-01-18 ENCOUNTER — Encounter: Payer: Self-pay | Admitting: Nurse Practitioner

## 2024-01-18 MED ORDER — MELOXICAM 7.5 MG PO TABS: 7.5000 mg | ORAL_TABLET | Freq: Every day | ORAL | 1 refills | Status: DC

## 2024-01-18 NOTE — Telephone Encounter (Signed)
 Edited medication refill to go to the local pharmacy not the mail order [facility]

## 2024-01-18 NOTE — Addendum Note (Signed)
 Addended by: LENON ROUGHEN on: 01/18/2024 03:37 PM   Modules accepted: Orders

## 2024-01-18 NOTE — Telephone Encounter (Signed)
 Last prescribed/filled by historical provider  Medication: Meloxicam  (Mobic ) 15 mg  Directions: Take 1 tablet by mouth daily  Last given: 03/06/23  Last office: 11/22/23 Follow up: 3 months-SCHEDULED 02/21/24

## 2024-01-25 DIAGNOSIS — D0339 Melanoma in situ of other parts of face: Secondary | ICD-10-CM | POA: Diagnosis not present

## 2024-02-01 ENCOUNTER — Ambulatory Visit (AMBULATORY_SURGERY_CENTER): Admitting: *Deleted

## 2024-02-01 VITALS — Ht 67.0 in | Wt 240.0 lb

## 2024-02-01 DIAGNOSIS — K229 Disease of esophagus, unspecified: Secondary | ICD-10-CM

## 2024-02-01 DIAGNOSIS — Z860100 Personal history of colon polyps, unspecified: Secondary | ICD-10-CM

## 2024-02-01 MED ORDER — NA SULFATE-K SULFATE-MG SULF 17.5-3.13-1.6 GM/177ML PO SOLN: 1.0000 | Freq: Once | ORAL | 0 refills | Status: AC

## 2024-02-01 NOTE — Progress Notes (Signed)
 Pt's name and DOB verified at the beginning of the pre-visit wit 2 identifiers  Permission given to speak with  Pt denies any difficulty with ambulating,sitting, laying down or rolling side to side  Pt has no issues moving head neck or swallowing  No egg or soy allergy known to patient   No issues known to pt with past sedation with any surgeries or procedures  No FH of Malignant Hyperthermia  Pt is not on home 02   Pt is not on blood thinners   Pt denies issues with constipation  Pt is not on dialysis  Pt denise any abnormal heart rhythms   Pt denies any upcoming cardiac testing  Patient's chart reviewed by Norleen Schillings CNRA prior to pre-visit and patient appropriate for the [facility].  Pre-visit completed and red dot placed by patient's name on their procedure day (on provider's schedule).    Visit by phone  Pt states weight is 240 lb  Instructions reviewed. Pt given  both [facility] main # and MD on call # prior to instructions.  Pt states understanding of instructions. Instructed pt to review instructions again prior to procedure and call main # given if has questions.. Pt states they will.   Instructed pt on where to find instructions on My Chart.

## 2024-02-17 ENCOUNTER — Telehealth: Payer: Self-pay

## 2024-02-17 NOTE — Telephone Encounter (Signed)
 Procedure:COLON Procedure date: 02/23/2024 Procedure location: [facility] Arrival Time: 7:00 Spoke with the patient Y/N:  Y Any prep concerns? N  Has the patient obtained the prep from the pharmacy ? Y Do you have a care partner and transportation: Y Any additional concerns? N

## 2024-02-18 ENCOUNTER — Telehealth: Payer: Self-pay | Admitting: *Deleted

## 2024-02-18 NOTE — Telephone Encounter (Signed)
 Dear C.Nche, NP:  We have scheduled the above named patient for an endoscopy/colonoscopy procedure on 02/23/24 at [facility]   [facility] anesthesia has requested that patient be given medical clearance to move forward with these procedures when he comes to see you on 02/21/24 (see additional documentation below with their request).   Please advise as to whether the patient may proceed with the scheduled endoscopy and colonoscopy  Please route your response to Naomie Sharps, RN.  Sincerely,    [facility]

## 2024-02-18 NOTE — Progress Notes (Signed)
 Pre op call Andres Taylor   PCP-Nche MD Cardiologist-[facility] Pulmonologist-Wert MD   EKG-01/14/21 [id number] Cath-n/a Stress-2020 ICD/PM- n/a GLP1-n/a Blood Thinner-n/a  History: Aortic Atherosclerosis, Chronic Bronchitis, CKD, DM, HTN, pulm HTN, Difficult Intubation. Last saw pulm in 04/2023 for acute cough variant asthma, was recommended f/u in 6 weeks. Last time he saw cardiology was 2021, was due to f/u yearly. Patient said he is currently not needing to see them, not having cardiac or breathing issues, uses cane for longer distances. He states is due to see his pcp again 8/11. Anesthesia Review- yes, requesting medical pcp clearance, GI office notified via epic chat 8/8

## 2024-02-21 ENCOUNTER — Ambulatory Visit (INDEPENDENT_AMBULATORY_CARE_PROVIDER_SITE_OTHER): Admitting: Nurse Practitioner

## 2024-02-21 ENCOUNTER — Encounter: Payer: Self-pay | Admitting: Nurse Practitioner

## 2024-02-21 ENCOUNTER — Ambulatory Visit: Payer: Self-pay | Admitting: Nurse Practitioner

## 2024-02-21 VITALS — BP 122/66 | HR 69 | Temp 98.4°F | Ht 68.0 in | Wt 238.6 lb

## 2024-02-21 DIAGNOSIS — E785 Hyperlipidemia, unspecified: Secondary | ICD-10-CM | POA: Diagnosis not present

## 2024-02-21 DIAGNOSIS — Z7984 Long term (current) use of oral hypoglycemic drugs: Secondary | ICD-10-CM

## 2024-02-21 DIAGNOSIS — I10 Essential (primary) hypertension: Secondary | ICD-10-CM

## 2024-02-21 DIAGNOSIS — I491 Atrial premature depolarization: Secondary | ICD-10-CM | POA: Insufficient documentation

## 2024-02-21 DIAGNOSIS — Z01818 Encounter for other preprocedural examination: Secondary | ICD-10-CM | POA: Diagnosis not present

## 2024-02-21 DIAGNOSIS — E1169 Type 2 diabetes mellitus with other specified complication: Secondary | ICD-10-CM

## 2024-02-21 LAB — HEMOGLOBIN A1C: Hgb A1c MFr Bld: 7.6 % — ABNORMAL HIGH (ref 4.6–6.5)

## 2024-02-21 LAB — RENAL FUNCTION PANEL
Albumin: 4.3 g/dL (ref 3.5–5.2)
BUN: 15 mg/dL (ref 6–23)
CO2: 28 meq/L (ref 19–32)
Calcium: 9.1 mg/dL (ref 8.4–10.5)
Chloride: 100 meq/L (ref 96–112)
Creatinine, Ser: 0.98 mg/dL (ref 0.40–1.50)
GFR: 73.35 mL/min (ref >60.00)
Glucose, Bld: 146 mg/dL — ABNORMAL HIGH (ref 70–99)
Phosphorus: 3.5 mg/dL (ref 2.3–4.6)
Potassium: 4.1 meq/L (ref 3.5–5.1)
Sodium: 138 meq/L (ref 135–145)

## 2024-02-21 LAB — LIPID PANEL
Cholesterol: 115 mg/dL (ref 0–200)
HDL: 33.6 mg/dL — ABNORMAL LOW (ref >39.00)
LDL Cholesterol: 44 mg/dL (ref 0–99)
NonHDL: 81.6
Total CHOL/HDL Ratio: 3
Triglycerides: 188 mg/dL — ABNORMAL HIGH (ref 0.0–149.0)
VLDL: 37.6 mg/dL (ref 0.0–40.0)

## 2024-02-21 NOTE — Progress Notes (Signed)
 Established Patient Visit  Patient: Andres Taylor   DOB: 05-13-1946   78 y.o. Male  MRN: [id number] Visit Date: 02/21/2024  Subjective:    Chief Complaint  Patient presents with   Follow-up    FASTING 3 month follow up for DM, HTN and Hyperlipidemia    HPI Ectopic atrial rhythm Asymptomatic, PR at , HR 66  ECG overview with Rollo Louder, PA with [facility] cardiology and Attending Physician Dr. Jeffrie.  Ok to precede with colonoscopy  HTN (hypertension), benign BP at goal with benicar  and hydrochlorothiazide . BP Readings from Last 3 Encounters:  02/21/24 122/66  11/22/23 130/70  06/04/23 122/68    Maintain med doses Repeat BMP F/up in 3months  Type 2 diabetes mellitus without complications (HCC) Repeat hgbA1c, BMP, lipid panel normal UACs LDL at goal with atorvastatin  BP at goal Current use of metformin . Advised to schedule DIABETES eye exam  Maintain med dose F/up in 3months   Wt Readings from Last 3 Encounters:  02/21/24 238 lb 9.6 oz (108.2 kg)  02/01/24 240 lb (108.9 kg)  11/22/23 243 lb 6.4 oz (110.4 kg)    BP Readings from Last 3 Encounters:  02/21/24 122/66  11/22/23 130/70  06/04/23 122/68    Reviewed medical, surgical, and social history today  Medications: Outpatient Medications Prior to Visit  Medication Sig   albuterol  (VENTOLIN  HFA) 108 (90 Base) MCG/ACT inhaler Inhale 2 puffs into the lungs every 6 (six) hours as needed.   allopurinol  (ZYLOPRIM ) 100 MG tablet TAKE 1 TABLET BY MOUTH DAILY   ammonium lactate (AMLACTIN) 12 % cream Apply topically daily. (Patient taking differently: Apply topically as needed for dry skin.)   aspirin  EC 81 MG tablet Take 81 mg by mouth daily. Takes couple times a week   atorvastatin  (LIPITOR) 40 MG tablet Take 1 tablet (40 mg total) by mouth every evening.   budesonide -formoterol  (SYMBICORT ) 80-4.5 MCG/ACT inhaler Take 2 puffs first thing in am and then another 2 puffs about 12 hours  later.   cetirizine (ZYRTEC) 10 MG chewable tablet Chew 10 mg by mouth daily.   Coenzyme Q-10 100 MG capsule Take 100 mg by mouth 2 (two) times daily.   diltiazem  (CARDIZEM  CD) 180 MG 24 hr capsule TAKE 1 CAPSULE(180 MG) BY MOUTH DAILY   fluticasone  (FLONASE ) 50 MCG/ACT nasal spray Place 2 sprays into both nostrils daily. (Patient taking differently: Place 2 sprays into both nostrils as needed for allergies or rhinitis.)   furosemide  (LASIX ) 20 MG tablet as needed.   gabapentin  (NEURONTIN ) 300 MG capsule Take 1 capsule (300 mg total) by mouth at bedtime.   guaiFENesin  (MUCINEX ) 600 MG 12 hr tablet Take 600 mg by mouth 2 (two) times daily. (Patient taking differently: Take 600 mg by mouth 2 (two) times daily as needed for cough or to loosen phlegm.)   hydrochlorothiazide  (MICROZIDE ) 12.5 MG capsule TAKE 1 CAPSULE BY MOUTH DAILY   meloxicam  (MOBIC ) 7.5 MG tablet Take 1 tablet (7.5 mg total) by mouth daily. With food   metFORMIN  (GLUCOPHAGE ) 850 MG tablet Take 1 tablet (850 mg total) by mouth 2 (two) times daily with a meal. Take 1bid   methocarbamol  (ROBAXIN ) 750 MG tablet Take 1 tablet (750 mg total) by mouth 3 (three) times daily.   Multiple Vitamin (MULTIVITAMIN) tablet Take 1 tablet by mouth daily.   olmesartan  (BENICAR ) 20 MG tablet TAKE 1 TABLET BY MOUTH DAILY  omega-3 acid ethyl esters (LOVAZA ) 1 g capsule Take 1 capsule (1 g total) by mouth daily.   glipiZIDE  (GLUCOTROL  XL) 5 MG 24 hr tablet Take 1 tablet (5 mg total) by mouth daily before breakfast.   ipratropium (ATROVENT ) 0.06 % nasal spray USE 2 SPRAYS IN BOTH NOSTRILS IN THE MORNING AND AT BEDTIME (Patient not taking: Reported on 02/21/2024)   montelukast  (SINGULAIR ) 10 MG tablet  (Patient not taking: Reported on 02/21/2024)   Facility-Administered Medications Prior to Visit  Medication Dose Route Frequency Provider   albuterol  (PROVENTIL ) (2.5 MG/3ML) 0.083% nebulizer solution 2.5 mg  2.5 mg Nebulization Once    Reviewed past  medical and social history.   ROS per HPI above      Objective:  BP 122/66 (BP Location: Left Arm, Patient Position: Sitting, Cuff Size: Large)   Pulse 69   Temp 98.4 F (36.9 C) (Oral)   Ht 5' 8 (1.727 m)   Wt 238 lb 9.6 oz (108.2 kg)   SpO2 93%   BMI 36.28 kg/m      Physical Exam Vitals and nursing note reviewed.  Cardiovascular:     Rate and Rhythm: Normal rate and regular rhythm.     Pulses: Normal pulses.     Heart sounds: Normal heart sounds.  Pulmonary:     Effort: Pulmonary effort is normal.     Breath sounds: Normal breath sounds.  Musculoskeletal:     Right lower leg: No edema.     Left lower leg: No edema.  Neurological:     Mental Status: He is alert and oriented to person, place, and time.     No results found for any visits on 02/21/24.    Assessment & Plan:    Problem List Items Addressed This Visit     Ectopic atrial rhythm   Asymptomatic, PR at , HR 66  ECG overview with Rollo Louder, PA with [facility] cardiology and Attending Physician Dr. Jeffrie.  Ok to precede with colonoscopy      HTN (hypertension), benign (Chronic)   BP at goal with benicar  and hydrochlorothiazide . BP Readings from Last 3 Encounters:  02/21/24 122/66  11/22/23 130/70  06/04/23 122/68    Maintain med doses Repeat BMP F/up in 3months      Relevant Orders   Renal Function Panel   Hyperlipidemia associated with type 2 diabetes mellitus (HCC)   Relevant Orders   Lipid panel   Type 2 diabetes mellitus without complications (HCC)   Repeat hgbA1c, BMP, lipid panel normal UACs LDL at goal with atorvastatin  BP at goal Current use of metformin . Advised to schedule DIABETES eye exam  Maintain med dose F/up in 3months      Other Visit Diagnoses       Preoperative clearance    -  Primary   Relevant Orders   EKG 12-Lead      Return in about 3 months (around 05/23/2024) for DM, hyperlipidemia (fasting).     Roselie Mood, NP

## 2024-02-21 NOTE — Assessment & Plan Note (Signed)
 Asymptomatic, PR at , HR 66  ECG overview with Rollo Louder, PA with [facility] cardiology and Attending Physician Dr. Jeffrie.  Ok to precede with colonoscopy

## 2024-02-21 NOTE — Patient Instructions (Addendum)
 Go to lab Maintain Heart healthy diet and daily exercise. Maintain current medications. Schedule appointment for DIABETES eye exam, have report faxed to me Obtain 2nd shingrix and TDAP vaccine dose from the retail pharmacy

## 2024-02-21 NOTE — Telephone Encounter (Signed)
 [facility] endo has been advised of the clearance.

## 2024-02-21 NOTE — Assessment & Plan Note (Signed)
 Repeat hgbA1c, BMP, lipid panel normal UACs LDL at goal with atorvastatin  BP at goal Current use of metformin . Advised to schedule DIABETES eye exam  Maintain med dose F/up in 3months

## 2024-02-21 NOTE — Assessment & Plan Note (Signed)
 BP at goal with benicar  and hydrochlorothiazide . BP Readings from Last 3 Encounters:  02/21/24 122/66  11/22/23 130/70  06/04/23 122/68    Maintain med doses Repeat BMP F/up in 3months

## 2024-02-23 ENCOUNTER — Encounter (HOSPITAL_COMMUNITY)
Admission: RE | Disposition: A | Discharge status: Home / Self Care | Payer: Self-pay | Source: Home / Self Care | Attending: Internal Medicine

## 2024-02-23 ENCOUNTER — Ambulatory Visit (HOSPITAL_COMMUNITY): Payer: Self-pay | Admitting: Medical

## 2024-02-23 ENCOUNTER — Other Ambulatory Visit: Payer: Self-pay

## 2024-02-23 ENCOUNTER — Encounter (HOSPITAL_COMMUNITY): Payer: Self-pay | Admitting: Internal Medicine

## 2024-02-23 ENCOUNTER — Ambulatory Visit (HOSPITAL_COMMUNITY)
Admission: RE | Admit: 2024-02-23 | Discharge: 2024-02-23 | Disposition: A | Discharge status: Home / Self Care | Attending: Internal Medicine | Admitting: Internal Medicine

## 2024-02-23 DIAGNOSIS — Z860101 Personal history of adenomatous and serrated colon polyps: Secondary | ICD-10-CM | POA: Diagnosis not present

## 2024-02-23 DIAGNOSIS — K2270 Barrett's esophagus without dysplasia: Secondary | ICD-10-CM | POA: Diagnosis not present

## 2024-02-23 DIAGNOSIS — K760 Fatty (change of) liver, not elsewhere classified: Secondary | ICD-10-CM | POA: Insufficient documentation

## 2024-02-23 DIAGNOSIS — D128 Benign neoplasm of rectum: Secondary | ICD-10-CM | POA: Diagnosis not present

## 2024-02-23 DIAGNOSIS — K3189 Other diseases of stomach and duodenum: Secondary | ICD-10-CM | POA: Diagnosis not present

## 2024-02-23 DIAGNOSIS — K219 Gastro-esophageal reflux disease without esophagitis: Secondary | ICD-10-CM | POA: Insufficient documentation

## 2024-02-23 DIAGNOSIS — K449 Diaphragmatic hernia without obstruction or gangrene: Secondary | ICD-10-CM | POA: Diagnosis not present

## 2024-02-23 DIAGNOSIS — Z6836 Body mass index (BMI) 36.0-36.9, adult: Secondary | ICD-10-CM | POA: Diagnosis not present

## 2024-02-23 DIAGNOSIS — Z09 Encounter for follow-up examination after completed treatment for conditions other than malignant neoplasm: Secondary | ICD-10-CM | POA: Insufficient documentation

## 2024-02-23 DIAGNOSIS — Z860100 Personal history of colon polyps, unspecified: Secondary | ICD-10-CM

## 2024-02-23 DIAGNOSIS — K5730 Diverticulosis of large intestine without perforation or abscess without bleeding: Secondary | ICD-10-CM | POA: Insufficient documentation

## 2024-02-23 DIAGNOSIS — Z7951 Long term (current) use of inhaled steroids: Secondary | ICD-10-CM | POA: Diagnosis not present

## 2024-02-23 DIAGNOSIS — D122 Benign neoplasm of ascending colon: Secondary | ICD-10-CM | POA: Diagnosis not present

## 2024-02-23 DIAGNOSIS — D123 Benign neoplasm of transverse colon: Secondary | ICD-10-CM | POA: Diagnosis not present

## 2024-02-23 DIAGNOSIS — G4733 Obstructive sleep apnea (adult) (pediatric): Secondary | ICD-10-CM | POA: Insufficient documentation

## 2024-02-23 DIAGNOSIS — K2100 Gastro-esophageal reflux disease with esophagitis, without bleeding: Secondary | ICD-10-CM

## 2024-02-23 DIAGNOSIS — N182 Chronic kidney disease, stage 2 (mild): Secondary | ICD-10-CM | POA: Diagnosis not present

## 2024-02-23 DIAGNOSIS — Z7984 Long term (current) use of oral hypoglycemic drugs: Secondary | ICD-10-CM | POA: Insufficient documentation

## 2024-02-23 DIAGNOSIS — E1122 Type 2 diabetes mellitus with diabetic chronic kidney disease: Secondary | ICD-10-CM | POA: Diagnosis not present

## 2024-02-23 DIAGNOSIS — E669 Obesity, unspecified: Secondary | ICD-10-CM | POA: Insufficient documentation

## 2024-02-23 DIAGNOSIS — I129 Hypertensive chronic kidney disease with stage 1 through stage 4 chronic kidney disease, or unspecified chronic kidney disease: Secondary | ICD-10-CM | POA: Insufficient documentation

## 2024-02-23 DIAGNOSIS — J45909 Unspecified asthma, uncomplicated: Secondary | ICD-10-CM | POA: Diagnosis not present

## 2024-02-23 DIAGNOSIS — Z1211 Encounter for screening for malignant neoplasm of colon: Secondary | ICD-10-CM | POA: Diagnosis not present

## 2024-02-23 DIAGNOSIS — K209 Esophagitis, unspecified without bleeding: Secondary | ICD-10-CM | POA: Diagnosis not present

## 2024-02-23 DIAGNOSIS — Z87891 Personal history of nicotine dependence: Secondary | ICD-10-CM | POA: Insufficient documentation

## 2024-02-23 DIAGNOSIS — K635 Polyp of colon: Secondary | ICD-10-CM | POA: Diagnosis not present

## 2024-02-23 HISTORY — PX: COLONOSCOPY: SHX5424

## 2024-02-23 HISTORY — PX: ESOPHAGOGASTRODUODENOSCOPY: SHX5428

## 2024-02-23 LAB — GLUCOSE, CAPILLARY: Glucose-Capillary: 135 mg/dL — ABNORMAL HIGH (ref 70–99)

## 2024-02-23 SURGERY — COLONOSCOPY
Anesthesia: Monitor Anesthesia Care

## 2024-02-23 MED ORDER — LIDOCAINE HCL (CARDIAC) PF 100 MG/5ML IV SOSY
PREFILLED_SYRINGE | INTRAVENOUS | Status: DC | PRN
  Administered 2024-02-23 (×2): 60 mg via INTRAVENOUS

## 2024-02-23 MED ORDER — EPHEDRINE SULFATE (PRESSORS) 50 MG/ML IJ SOLN
INTRAMUSCULAR | Status: DC | PRN
Start: 2024-02-23 — End: 2024-02-23
  Administered 2024-02-23: 5 mg via INTRAVENOUS
  Administered 2024-02-23: 10 mg via INTRAVENOUS
  Administered 2024-02-23 (×2): 5 mg via INTRAVENOUS
  Administered 2024-02-23: 10 mg via INTRAVENOUS
  Administered 2024-02-23: 5 mg via INTRAVENOUS

## 2024-02-23 MED ORDER — PHENYLEPHRINE HCL (PRESSORS) 10 MG/ML IV SOLN
INTRAVENOUS | Status: DC | PRN
  Administered 2024-02-23: 120 ug via INTRAVENOUS
  Administered 2024-02-23: 160 ug via INTRAVENOUS
  Administered 2024-02-23 (×3): 120 ug via INTRAVENOUS
  Administered 2024-02-23: 160 ug via INTRAVENOUS

## 2024-02-23 MED ORDER — ONDANSETRON HCL 4 MG/2ML IJ SOLN
INTRAMUSCULAR | Status: DC | PRN
  Administered 2024-02-23 (×2): 4 mg via INTRAVENOUS

## 2024-02-23 MED ORDER — LACTATED RINGERS IV SOLN
INTRAVENOUS | Status: DC | PRN
Start: 2024-02-23 — End: 2024-02-23

## 2024-02-23 MED ORDER — PROPOFOL 500 MG/50ML IV EMUL
INTRAVENOUS | Status: DC | PRN
  Administered 2024-02-23 (×2): 125 ug/kg/min via INTRAVENOUS
  Administered 2024-02-23 (×2): 40 mg via INTRAVENOUS

## 2024-02-23 MED ORDER — PROPOFOL 500 MG/50ML IV EMUL
INTRAVENOUS | Status: AC
  Filled 2024-02-23: qty 50

## 2024-02-23 MED ORDER — PROPOFOL 1000 MG/100ML IV EMUL
INTRAVENOUS | Status: AC
Start: 2024-02-23 — End: 2024-02-23
  Filled 2024-02-23: qty 300

## 2024-02-23 MED ORDER — SODIUM CHLORIDE 0.9 % IV SOLN: INTRAVENOUS | Status: DC

## 2024-02-23 NOTE — Discharge Instructions (Signed)

## 2024-02-23 NOTE — Op Note (Addendum)
 [facility] Patient Name: Andres Taylor Procedure Date: 02/23/2024 MRN: [id number] Attending MD: Norleen SAILOR. Abran , MD, [id number] Date of Birth: 11/03/1945 CSN: [id number] Age: 78 Admit Type: Outpatient Procedure:                Upper GI endoscopy with biopsies Indications:              Esophageal reflux, Surveillance for malignancy due                            to personal history of Barrett's esophagus Providers:                Norleen SAILOR. Abran, MD, Hoy Penner, RN, Fairy Marina, Technician Referring MD:             Recall Medicines:                Monitored Anesthesia Care Complications:            No immediate complications. Estimated Blood Loss:     Estimated blood loss: none. Procedure:                Pre-Anesthesia Assessment:                           - Prior to the procedure, a History and Physical                            was performed, and patient medications and                            allergies were reviewed. [name] tolerance of                            previous anesthesia was also reviewed. The risks                            and benefits of the procedure and the sedation                            options and risks were discussed with the patient.                            All questions were answered, and informed consent                            was obtained. Prior Anticoagulants: The patient has                            taken no anticoagulant or antiplatelet agents. ASA                            Grade Assessment: III - A patient with severe  systemic disease. After reviewing the risks and                            benefits, the patient was deemed in satisfactory                            condition to undergo the procedure.                           After obtaining informed consent, the endoscope was                            passed under direct vision. Throughout the                             procedure, the patient's blood pressure, pulse, and                            oxygen  saturations were monitored continuously. The                            [id number]-[id number] ([id number]) Olympus endoscope was introduced                            through the mouth, and advanced to the second part                            of duodenum. The upper GI endoscopy was                            accomplished without difficulty. The patient                            tolerated the procedure well. Scope In: Scope Out: Findings:      The esophagus revealed a slightly raised area at 39 cm from the       incisors. This was linear and approximately 15 mm in length. Biopsies       were taken with a cold forceps for histology. The Z-line was irregular       with mild inflammation. Multiple biopsies taken      The stomach revealed small hiatal hernia and mildly erythematous antral       mucosa. Otherwise normal. (Image did not capture)      The examined duodenum was normal. (Image did not capture)      The cardia and gastric fundus were normal on retroflexion. (Image did       not capture) Impression:               1. Short segment Barrett's                           2. Raised area of esophagus as described                           3. Hernia  4. Unremarkable EGD Moderate Sedation:      none Recommendation:           - Patient has a contact number available for                            emergencies. The signs and symptoms of potential                            delayed complications were discussed with the                            patient. Return to normal activities tomorrow.                            Written discharge instructions were provided to the                            patient.                           - Resume previous diet.                           - Continue present medications.                           - Await pathology results. Procedure  Code(s):        --- Professional ---                           [phone], Esophagogastroduodenoscopy, flexible,                            transoral; with biopsy, single or multiple Diagnosis Code(s):        --- Professional ---                           K22.70, Barrett's esophagus without dysplasia                           K21.9, Gastro-esophageal reflux disease without                            esophagitis CPT copyright 2022 American [employer]. All rights reserved. The codes documented in this report are preliminary and upon coder review may  be revised to meet current compliance requirements. Norleen SAILOR. Abran, MD 02/23/2024 9:55:13 AM This report has been signed electronically. Number of Addenda: 0

## 2024-02-23 NOTE — Anesthesia Postprocedure Evaluation (Signed)
 Anesthesia Post Note  Patient: Andres Taylor [facility]  Procedure(s) Performed: COLONOSCOPY EGD (ESOPHAGOGASTRODUODENOSCOPY)     Patient location during evaluation: Endoscopy Anesthesia Type: MAC Level of consciousness: awake and alert Pain management: pain level controlled Vital Signs Assessment: post-procedure vital signs reviewed and stable Respiratory status: spontaneous breathing, nonlabored ventilation, respiratory function stable and patient connected to nasal cannula oxygen  Cardiovascular status: blood pressure returned to baseline and stable Postop Assessment: no apparent nausea or vomiting Anesthetic complications: no   No notable events documented.  Last Vitals:  Vitals:   02/23/24 1000 02/23/24 1010  BP: (!) 123/55 (!) 126/52  Pulse: 98 97  Resp: (!) 23 (!) 23  Temp:    SpO2: 100% 96%    Last Pain:  Vitals:   02/23/24 1000  TempSrc:   PainSc: 0-No pain                 Idalee Foxworthy L Hermann Dottavio

## 2024-02-23 NOTE — Transfer of Care (Signed)
 Immediate Anesthesia Transfer of Care Note  Patient: Andres Taylor [facility]  Procedure(s) Performed: Procedure(s): COLONOSCOPY (N/A) EGD (ESOPHAGOGASTRODUODENOSCOPY) (N/A)  Patient Location: PACU  Anesthesia Type:MAC  Level of Consciousness:  sedated, patient cooperative and responds to stimulation  Airway & Oxygen  Therapy:Patient Spontanous Breathing and Patient connected to face mask oxgen  Post-op Assessment:  Report given to PACU RN and Post -op Vital signs reviewed and stable  Post vital signs:  Reviewed and stable  Last Vitals:  Vitals:   02/23/24 0815 02/23/24 0952  BP: 126/71   Pulse: 99 97  Resp: (!) 27 17  Temp: (!) 36.4 C   SpO2: 94% 100%    Complications: No apparent anesthesia complications

## 2024-02-23 NOTE — Op Note (Addendum)
 [facility] Patient Name: Andres Taylor Procedure Date: 02/23/2024 MRN: [id number] Attending MD: Norleen SAILOR. Abran , MD, [id number] Date of Birth: May 22, 1946 CSN: [id number] Age: 78 Admit Type: Outpatient Procedure:                Colonoscopy with cold snare polypectomy x 2; hot                            snare polypectomy x 1 Indications:              High risk colon cancer surveillance: Personal                            history of multiple (3 or more) adenomas. Previous                            examinations 2012 elsewhere and 2021 with Dr. Teressa Providers:                Norleen SAILOR. Abran, MD, Hoy Penner, RN, Fairy Marina, Technician Referring MD:             Recall Medicines:                Monitored Anesthesia Care Complications:            No immediate complications. Estimated blood loss:                            None. Estimated Blood Loss:     Estimated blood loss: none. Procedure:                Pre-Anesthesia Assessment:                           - Prior to the procedure, a History and Physical                            was performed, and patient medications and                            allergies were reviewed. [name] tolerance of                            previous anesthesia was also reviewed. The risks                            and benefits of the procedure and the sedation                            options and risks were discussed with the patient.                            All questions were answered, and informed consent  was obtained. Prior Anticoagulants: The patient has                            taken no anticoagulant or antiplatelet agents. ASA                            Grade Assessment: III - A patient with severe                            systemic disease. After reviewing the risks and                            benefits, the patient was deemed in satisfactory                             condition to undergo the procedure.                           After obtaining informed consent, the colonoscope                            was passed under direct vision. Throughout the                            procedure, the patient's blood pressure, pulse, and                            oxygen  saturations were monitored continuously. The                            [id number]-[id number] ([id number]) Olympus colonoscope was                            introduced through the anus and advanced to the the                            cecum, identified by appendiceal orifice and                            ileocecal valve. The ileocecal valve, appendiceal                            orifice, and rectum were photographed. The quality                            of the bowel preparation was excellent. The                            colonoscopy was performed without difficulty. The                            patient tolerated the procedure well. The bowel  preparation used was SUPREP via split dose                            instruction. Scope In: 9:04:26 AM Scope Out: 9:26:37 AM Scope Withdrawal Time: 0 hours 16 minutes 29 seconds  Total Procedure Duration: 0 hours 22 minutes 11 seconds  Findings:      A 10 mm polyp was found in the rectum. The polyp was semi-pedunculated.       The polyp was removed with a hot snare. Resection and retrieval were       complete.      Two polyps were found in the transverse colon and ascending colon. The       polyps were 4 to 5 mm in size. These polyps were removed with a cold       snare. Resection and retrieval were complete.      Diverticula were found in the left colon.      The exam was otherwise without abnormality on direct and retroflexion       views. Impression:               - One 10 mm polyp in the rectum, removed with a hot                            snare. Resected and retrieved.                           - Two 4 to 5 mm  polyps in the transverse colon and                            in the ascending colon, removed with a cold snare.                            Resected and retrieved.                           - Diverticulosis in the left colon.                           - The examination was otherwise normal on direct                            and retroflexion views. Moderate Sedation:      none Recommendation:           - Repeat colonoscopy is not recommended for                            surveillance.                           - Patient has a contact number available for                            emergencies. The signs and symptoms of potential                            delayed  complications were discussed with the                            patient. Return to normal activities tomorrow.                            Written discharge instructions were provided to the                            patient.                           - Resume previous diet.                           - Continue present medications.                           - Await pathology results. Procedure Code(s):        --- Professional ---                           [phone], Colonoscopy, flexible; with removal of                            tumor(s), polyp(s), or other lesion(s) by snare                            technique Diagnosis Code(s):        --- Professional ---                           Z86.010, Personal history of colonic polyps                           D12.8, Benign neoplasm of rectum                           D12.3, Benign neoplasm of transverse colon (hepatic                            flexure or splenic flexure)                           D12.2, Benign neoplasm of ascending colon                           K57.30, Diverticulosis of large intestine without                            perforation or abscess without bleeding CPT copyright 2022 American Medical Association. All rights reserved. The codes documented in this  report are preliminary and upon coder review may  be revised to meet current compliance requirements. Norleen SAILOR. Abran, MD 02/23/2024 9:36:05 AM This report has been signed electronically. Number of Addenda: 0

## 2024-02-23 NOTE — Anesthesia Preprocedure Evaluation (Addendum)
 Anesthesia Evaluation  Patient identified by MRN, date of birth, ID band Patient awake    Reviewed: Allergy & Precautions, NPO status , Patient's Chart, lab work & pertinent test results  History of Anesthesia Complications (+) DIFFICULT AIRWAY and history of anesthetic complications  Airway Mallampati: IV  TM Distance: >3 FB Neck ROM: Full    Dental  (+) Teeth Intact, Dental Advisory Given   Pulmonary asthma , sleep apnea and Continuous Positive Airway Pressure Ventilation , former smoker   Pulmonary exam normal breath sounds clear to auscultation       Cardiovascular hypertension, Pt. on medications pulmonary hypertension+ angina  Normal cardiovascular exam Rhythm:Regular Rate:Normal  TTE 2020 1. Left ventricular ejection fraction, by visual estimation, is 60 to  65%. The left ventricle has normal function. Normal left ventricular size.  There is mildly increased left ventricular hypertrophy.   2. Global right ventricle has normal systolic function.The right  ventricular size is normal. No increase in right ventricular wall  thickness.   3. Left atrial size was normal.   4. Right atrial size was normal.   5. Presence of pericardial fat pad.   6. The mitral valve is normal in structure. No evidence of mitral valve  regurgitation. No evidence of mitral stenosis.   7. The tricuspid valve is normal in structure. Tricuspid valve  regurgitation is trivial.   8. The aortic valve is tricuspid Aortic valve regurgitation was not  visualized by color flow Doppler. Mild aortic valve sclerosis without  stenosis.   9. There is Mild calcification of the aortic valve.  10. There is Mild thickening of the aortic valve.  11. The pulmonic valve was grossly normal. Pulmonic valve regurgitation is  not visualized by color flow Doppler.  12. Mildly elevated pulmonary artery systolic pressure.  13. The inferior vena cava is normal in size with  greater than 50%  respiratory variability, suggesting right atrial pressure of 3 mmHg.  14. The interatrial septum appears to be lipomatous.   Stress Test 2020  Nuclear stress EF: 70%.  The left ventricular ejection fraction is hyperdynamic (>65%).  There was no ST segment deviation noted during stress.  The study is normal.  This is a low risk study.     Neuro/Psych negative neurological ROS  negative psych ROS   GI/Hepatic Neg liver ROS,GERD  ,,  Endo/Other  diabetes, Type 2, Oral Hypoglycemic Agents    Renal/GU Renal InsufficiencyRenal disease  negative genitourinary   Musculoskeletal  (+) Arthritis ,    Abdominal   Peds  Hematology negative hematology ROS (+)   Anesthesia Other Findings Last airway note: Pre O2 well. DL x1 ( unable to visualize ). Passed airway to Dr Leonce MDA . DL x1 with CP Attempted Bouge insertion - unable. Mask ventilated easily with 9.0 O/A. Glidescope used with successful intubation DL x 1 ETT 8.0 BBS equal. Soft gauze wrapped around tongue blade used as O/ A Noted small lip tear - ointment applied.  Reproductive/Obstetrics                              Anesthesia Physical Anesthesia Plan  ASA: 3  Anesthesia Plan: MAC   Post-op Pain Management:    Induction: Intravenous  PONV Risk Score and Plan: Propofol  infusion and Treatment may vary due to age or medical condition  Airway Management Planned: Natural Airway  Additional Equipment:   Intra-op Plan:   Post-operative Plan:  Informed Consent: I have reviewed the patients History and Physical, chart, labs and discussed the procedure including the risks, benefits and alternatives for the proposed anesthesia with the patient or authorized representative who has indicated his/her understanding and acceptance.     Dental advisory given  Plan Discussed with: CRNA  Anesthesia Plan Comments:          Anesthesia Quick Evaluation

## 2024-02-23 NOTE — H&P (Signed)
 Expand All Collapse All        Brief Narrative 78 y.o. yo male known remotely to Dr. Teressa ( 2020) with a past medical history not limited to HTN, DM, fatty liver, CKD, gout, asthma, , OSA on CPAP, benign RUL lung mass s/p resection 2015 obesity, colon polyps, Barrett's esophagus     ASSESSMENT     History of colon polyps. Two tubular adenomas ranging 6-10 mm in size removed in 2020.  Due for 3 year surveillance colonoscopy    Barrett's esophagus. ( Biopsies of irregular Z line on EGD 2020).  Due for surveillance EGD   See PMH for any additional medical history     PLAN    - The risks and benefits of colonoscopy with possible polypectomy / biopsies were discussed and the patient agrees to proceed. However, he decided to wait until May to have colonoscopy done. We are unable to schedule that far out. He will need to return for a pre-visit in April.  --Upon completion of this visit I realized patient also has a history of Barrett's esophagus and is due for surveillance. Therefore will need to be scheduled for EGD at time of colonoscopy. Will contact patient and make him aware. Also, appears not to be on a PPI Will confirm and start Omeprazole  20 mg daily     HPI    Chief complaint :  history of colon polyps   Mr. Maler has a history of adenomatous colon polyps and is due for surveillance colonoscopy. Since Dr. Teressa has retired patient would like Dr. Abran to assume his care. Dr. Abran takes care of his wife Donia. He was scheduled to see Dr. Abran today to discuss surveillance colonoscopy . Our office  cancelled the appointment but unfortunately patient didn't get the voicemail message. He showed up for the appt. I worked him in on my schedule.    Colonoscopy Jan 2021  two tubular adenomas removed ranging from 6-10 mm - A three follow up was recommended.   Mr. Mcconathy has no GI complaints. He takes Senokot on occasion but generally bowel movements are normal. No blood in stool.        GI History / Studies    **May not be a complete list of studies   Jan 2021 EGD and Colonoscopy for heme + stool    EGD - Irregular Z-line, biopsied. - Gastritis and duodenitis, biopsied. - The examination was otherwise normal. Colonoscopy - Two 6 to 10 mm polyps in the sigmoid colon and at the hepatic flexure, removed with a cold snare. Resected and retrieved. - Diverticulosis in the left colon. - The examination was otherwise normal on direct and retroflexion views.   Surgical [P], colon, sigmoid and hepatic flexure, polyp (2) - TUBULAR ADENOMA(S). - NO HIGH GRADE DYSPLASIA OR MALIGNANCY. 2. Surgical [P], gastric antrum and gastric body - ANTRAL AND OXYNTIC MUCOSA WITH SLIGHT CHRONIC INFLAMMATION. GLENWOOD PHLEGM NEGATIVE FOR HELICOBACTER PYLORI. - NO INTESTINAL METAPLASIA, DYSPLASIA, OR MALIGNANCY. 3. Surgical [P], duodenal bulb - DUODENAL MUCOSA WITH CHANGES CONSISTENT WITH PEPTIC INJURY. - NO DYSPLASIA OR MALIGNANCY. 4. Surgical [P], esophagus, GE junction - GASTROESOPHAGEAL MUCOSA WITH INTESTINAL METAPLASIA CONSISTENT WITH BARRETT'S. - NO DYSPLASIA OR MALIGNANCY.         Past Medical History:  Diagnosis Date   Abdominal bloating 03/13/2019   Allergic rhinitis     Allergy     Arthritis     Asthma     Blood in urine  Cataract      forming    Chronic cough     CKD (chronic kidney disease), stage II     DDD (degenerative disc disease)     Diabetes mellitus (HCC)     DOE (dyspnea on exertion)     Eustachian tube dysfunction     Fatty liver     GERD (gastroesophageal reflux disease)     Glaucoma     Gout     Hard of hearing      bilateral    Heme positive stool 03/13/2019   History of cardiomegaly 05/16/2019    Noted on CXR   History of kidney stones     Hyperlipidemia     Hypertension     Inguinal hernia      Bilateral   Joint pain, knee     Leg edema     Lung tumor (benign), right      Right upper lobe lung mass   Obesity     OSA on CPAP      Pneumonitis      right lung   Pulmonary HTN (HCC) 04/18/2013   Restrictive lung disease     Sciatica     Spinal stenosis     Wears contact lenses     Wears glasses     Wears hearing aid in both ears               Past Surgical History:  Procedure Laterality Date   APPENDECTOMY   1976   cataract surgery Bilateral      march and april 2023   COLONOSCOPY   08/2010   COLONOSCOPY   08/07/2019   HEMORRHOID SURGERY   2001   INGUINAL HERNIA REPAIR Left 1948    as a baby 87 months old   INGUINAL HERNIA REPAIR Bilateral 09/21/2019    Procedure: LAPAROSCOPIC BILATERAL INGUINAL HERNIA REPAIR, LYSIS OF ADHESIONS;  Surgeon: Sheldon Standing, MD;  Location:  SURGERY CENTER;  Service: General;  Laterality: Bilateral;   LEFT AND RIGHT HEART CATHETERIZATION WITH CORONARY ANGIOGRAM N/A 04/20/2013    Procedure: LEFT AND RIGHT HEART CATHETERIZATION WITH CORONARY ANGIOGRAM;  Surgeon: Alm LELON Clay, MD;  Location: [facility] CATH LAB;  Angiographically normal Coronary Arteries - L Dom Cx (OM1, small LPL1 then LPL2 & PDA) & wraparound LAD (D1-3 small).  Borderline CO/CI - 4.94/2.16 by both Fick & TD.  RAP ~14 mmHg, RVEDP 15 mmHg.  LVEDP 19 mmHg (->diastolic dysfunction) PCWP 16 mmHg, but PA Mean 25 mmHg   LITHOTRIPSY        x 2   LOBECTOMY Right     lumbar disectomy L4-5   1980   NM MYOVIEW  LTD   06/15/2019     EF 70%.  No ischemia or infarction.  LOW RISK    TRANSTHORACIC ECHOCARDIOGRAM   04/21/2019    EF 60 to 65%.  Mild LVH.  Normal RV.  Normal atrial size.  Mild aortic sclerosis.  No stenosis. Lipomatous intra-atrial septum   UPPER GI ENDOSCOPY   08/07/2019   VIDEO ASSISTED THORACOSCOPY (VATS)/WEDGE RESECTION Right 08/30/2013    Procedure: VIDEO ASSISTED THORACOSCOPY (VATS)/WEDGE RESECTION;  Surgeon: Standing JAYSON Millers, MD;  Location: [facility] OR;  Service: Thoracic;  Laterality: Right;             Family History  Problem Relation Age of Onset   Leukemia Father     Breast cancer Maternal  Grandmother     Cancer Maternal Grandmother  breast cancer   Stroke Maternal Grandmother     Hypertension Maternal Grandfather     Stroke Maternal Grandfather     Stroke Mother     Cancer Son          Ewing's sarcoma   Heart failure Son          Related to Adriamycin toxicity.  Had 2 consecutive heart transplants   AAA (abdominal aortic aneurysm) Son     Stroke Paternal Grandmother     Colon cancer Neg Hx     Esophageal cancer Neg Hx     Colon polyps Neg Hx     Rectal cancer Neg Hx     Stomach cancer Neg Hx          Social History  Social History         Tobacco Use   Smoking status: Former      Current packs/day: 0.00      Average packs/day: 1.5 packs/day for 42.0 years (63.0 ttl pk-yrs)      Types: Cigarettes      Start date: 07/13/1960      Quit date: 07/13/2002      Years since quitting: 20.9   Smokeless tobacco: Former  Building services engineer status: Never Used  Substance Use Topics   Alcohol  use: Yes      Alcohol /week: 3.0 standard drinks of alcohol       Types: 3 Cans of beer per week      Comment: occasionally   Drug use: No            Current Outpatient Medications  Medication Sig Dispense Refill   albuterol  (VENTOLIN  HFA) 108 (90 Base) MCG/ACT inhaler Inhale 2 puffs into the lungs every 6 (six) hours as needed. 8 g 0   allopurinol  (ZYLOPRIM ) 100 MG tablet TAKE 1 TABLET BY MOUTH DAILY 90 tablet 3   aspirin  EC 81 MG tablet Take 81 mg by mouth daily.       atorvastatin  (LIPITOR) 20 MG tablet Take 1 tablet (20 mg total) by mouth daily. 90 tablet 3   benzonatate  (TESSALON ) 200 MG capsule Take 1 capsule (200 mg total) by mouth 3 (three) times daily as needed. 21 capsule 0   budesonide -formoterol  (SYMBICORT ) 80-4.5 MCG/ACT inhaler Take 2 puffs first thing in am and then another 2 puffs about 12 hours later. 1 each 12   cetirizine (ZYRTEC) 10 MG chewable tablet Chew 10 mg by mouth daily.       Coenzyme Q-10 100 MG capsule Take 100 mg by mouth 2 (two) times  daily.       Continuous Glucose Sensor (DEXCOM G6 SENSOR) MISC Replace sensor every 10days 3 each 11   Continuous Glucose Transmitter (DEXCOM G6 TRANSMITTER) MISC Change transmitter every 90days 1 each 3   diltiazem  (CARDIZEM  CD) 180 MG 24 hr capsule TAKE 1 CAPSULE(180 MG) BY MOUTH DAILY 90 capsule 3   fluticasone  (FLONASE ) 50 MCG/ACT nasal spray Place 2 sprays into both nostrils daily. 18.2 mL 2   furosemide  (LASIX ) 20 MG tablet TAKE 1 TABLET BY MOUTH AS NEEDED, DO NOT TAKE HCTZ THE DAY YOU USE MEDICATION AS NEEDED FOR SWELLING OR SHORTNESS OF BREATH 20 tablet 0   gabapentin  (NEURONTIN ) 300 MG capsule Take 300 mg by mouth 3 (three) times daily.       guaiFENesin  (MUCINEX ) 600 MG 12 hr tablet Take 600 mg by mouth daily.       hydrochlorothiazide  (MICROZIDE ) 12.5 MG capsule Take  1 capsule (12.5 mg total) by mouth daily. 90 capsule 1   ipratropium (ATROVENT ) 0.06 % nasal spray USE 2 SPRAYS IN BOTH NOSTRILS IN THE MORNING AND AT BEDTIME 75 mL 3   meloxicam  (MOBIC ) 15 MG tablet Take 15 mg by mouth daily.       metFORMIN  (GLUCOPHAGE ) 850 MG tablet Take 1 tablet (850 mg total) by mouth 2 (two) times daily with a meal. Take 1bid 180 tablet 3   methocarbamol  (ROBAXIN ) 500 MG tablet Take 1 tablet (500 mg total) by mouth 2 (two) times daily as needed for muscle spasms. TAKE 1 TABLET BY MOUTH EVERY 12  HOURS 180 tablet 1   montelukast  (SINGULAIR ) 10 MG tablet TAKE 1 TABLET BY MOUTH AT  BEDTIME 90 tablet 3   Multiple Vitamin (MULTIVITAMIN) tablet Take 1 tablet by mouth daily.       olmesartan  (BENICAR ) 20 MG tablet TAKE 1 TABLET BY MOUTH  DAILY 90 tablet 3   omega-3 acid ethyl esters (LOVAZA ) 1 g capsule Take 1 capsule (1 g total) by mouth daily. 90 capsule 3   senna-docusate (SENOKOT-S) 8.6-50 MG tablet Take 1 tablet by mouth at bedtime as needed for mild constipation. 30 tablet 0               Current Facility-Administered Medications  Medication Dose Route Frequency Provider Last Rate Last Admin    albuterol  (PROVENTIL ) (2.5 MG/3ML) 0.083% nebulizer solution 2.5 mg  2.5 mg Nebulization Once            Allergies  No Known Allergies       Review of Systems: Positive for chronic SHOB with exertion.  All other systems reviewed and negative except where noted in HPI.       Wt Readings from Last 3 Encounters:  06/04/23 242 lb 2 oz (109.8 kg)  05/25/23 241 lb 3.2 oz (109.4 kg)  05/13/23 246 lb 12.8 oz (111.9 kg)      Physical Exam:  BP 122/68   Pulse 72   Ht 5' 8 (1.727 m)   Wt 242 lb 2 oz (109.8 kg)   SpO2 96%   BMI 36.81 kg/m  Constitutional:  Pleasant male in no acute distress. Psychiatric:  Normal mood and affect. Behavior is normal. EENT: Pupils normal.  Conjunctivae are normal. No scleral icterus. Neck supple.  Cardiovascular: Normal rate, regular rhythm.  Pulmonary/chest: Effort normal and breath sounds normal. No wheezing, rales or rhonchi. Abdominal: Soft, nondistended, nontender. Bowel sounds active throughout. There are no masses palpable. No hepatomegaly. Neurological: Alert and oriented to person place and time. Extremities: 2+ BLE edema     Vina Dasen, NP  06/04/2023, 11:19 AM                        Patient Instructions by Dasen Vina HERO, NP at 06/04/2023 11:00 AM   Most recent office consultation note with comprehensive physical exam and history as outlined above.  No interval changes in clinical history or physical exam.  Now for surveillance colonoscopy and upper endoscopy.  The patient was evaluated in the preprocedure admissions area of the hospital endoscopy suite.

## 2024-02-24 ENCOUNTER — Encounter (HOSPITAL_COMMUNITY): Payer: Self-pay | Admitting: Internal Medicine

## 2024-02-24 LAB — SURGICAL PATHOLOGY

## 2024-02-28 DIAGNOSIS — M47812 Spondylosis without myelopathy or radiculopathy, cervical region: Secondary | ICD-10-CM | POA: Diagnosis not present

## 2024-02-29 ENCOUNTER — Ambulatory Visit: Payer: Self-pay | Admitting: Internal Medicine

## 2024-04-13 DIAGNOSIS — M1610 Unilateral primary osteoarthritis, unspecified hip: Secondary | ICD-10-CM | POA: Diagnosis not present

## 2024-04-13 DIAGNOSIS — M25552 Pain in left hip: Secondary | ICD-10-CM | POA: Diagnosis not present

## 2024-04-18 ENCOUNTER — Other Ambulatory Visit: Payer: Self-pay | Admitting: Nurse Practitioner

## 2024-04-18 DIAGNOSIS — E1169 Type 2 diabetes mellitus with other specified complication: Secondary | ICD-10-CM

## 2024-04-25 ENCOUNTER — Other Ambulatory Visit: Payer: Self-pay | Admitting: Nurse Practitioner

## 2024-04-25 DIAGNOSIS — I10 Essential (primary) hypertension: Secondary | ICD-10-CM

## 2024-04-25 DIAGNOSIS — R053 Chronic cough: Secondary | ICD-10-CM

## 2024-05-11 DIAGNOSIS — Z129 Encounter for screening for malignant neoplasm, site unspecified: Secondary | ICD-10-CM | POA: Diagnosis not present

## 2024-05-11 DIAGNOSIS — Z86018 Personal history of other benign neoplasm: Secondary | ICD-10-CM | POA: Diagnosis not present

## 2024-05-11 DIAGNOSIS — D2271 Melanocytic nevi of right lower limb, including hip: Secondary | ICD-10-CM | POA: Diagnosis not present

## 2024-05-11 DIAGNOSIS — Z85820 Personal history of malignant melanoma of skin: Secondary | ICD-10-CM | POA: Diagnosis not present

## 2024-05-11 DIAGNOSIS — D225 Melanocytic nevi of trunk: Secondary | ICD-10-CM | POA: Diagnosis not present

## 2024-05-11 DIAGNOSIS — D2272 Melanocytic nevi of left lower limb, including hip: Secondary | ICD-10-CM | POA: Diagnosis not present

## 2024-05-11 DIAGNOSIS — Z85828 Personal history of other malignant neoplasm of skin: Secondary | ICD-10-CM | POA: Diagnosis not present

## 2024-05-15 ENCOUNTER — Encounter: Payer: Self-pay | Admitting: Nurse Practitioner

## 2024-05-15 DIAGNOSIS — I10 Essential (primary) hypertension: Secondary | ICD-10-CM

## 2024-05-15 DIAGNOSIS — M1A9XX0 Chronic gout, unspecified, without tophus (tophi): Secondary | ICD-10-CM

## 2024-05-15 MED ORDER — MELOXICAM 7.5 MG PO TABS: 7.5000 mg | ORAL_TABLET | Freq: Every day | ORAL | 1 refills | Status: DC

## 2024-05-15 NOTE — Telephone Encounter (Addendum)
 Called patient and informed him that on 04/19/24 Glipizide  5 mg was sent to [facility]. Patient stated that he has not received the mediation and asked that he medications be sent to [facility] because it is easier to pick up here and when in [place] .  Called [facility] and spoke with pharmacy staff. Staff member informed me that the medication was out for delivery and that it is expected to arrive 05/17/24. I thanked her for taking my call and assisting in the matter. Called patient and informed him that the Glipizide  5 mg is out for delivery with [facility] and expected to be delivered to his home at and/or on 05/17/24. He thanked me for calling and asked that I send the Meloxicam  7.5 mg to the [facility]. Informed him that I can do that and sent the Rx to the local [facility]

## 2024-05-18 MED ORDER — DILTIAZEM HCL ER COATED BEADS 180 MG PO CP24: ORAL_CAPSULE | ORAL | 1 refills | Status: DC

## 2024-05-18 MED ORDER — MONTELUKAST SODIUM 10 MG PO TABS: 10.0000 mg | ORAL_TABLET | Freq: Every day | ORAL | 1 refills | Status: DC

## 2024-05-18 MED ORDER — COLCHICINE 0.6 MG PO TABS: 0.6000 mg | ORAL_TABLET | Freq: Two times a day (BID) | ORAL | 0 refills | Status: AC | PRN

## 2024-05-18 NOTE — Addendum Note (Signed)
 Addended by: Almee Pelphrey on: 05/18/2024 03:58 PM   Modules accepted: Orders

## 2024-05-18 NOTE — Telephone Encounter (Signed)
 I have ordered the three medications as requested by the pt and they are pending your approval. Pt reports that even though they are not on his med list, he was recommended to continue using them and now his is out. I ordered them with the same instructions that they each had prior to being taken off of his med list.

## 2024-05-23 DIAGNOSIS — H40213 Acute angle-closure glaucoma, bilateral: Secondary | ICD-10-CM | POA: Diagnosis not present

## 2024-05-23 DIAGNOSIS — H52203 Unspecified astigmatism, bilateral: Secondary | ICD-10-CM | POA: Diagnosis not present

## 2024-05-23 DIAGNOSIS — H26491 Other secondary cataract, right eye: Secondary | ICD-10-CM | POA: Diagnosis not present

## 2024-05-23 DIAGNOSIS — H04123 Dry eye syndrome of bilateral lacrimal glands: Secondary | ICD-10-CM | POA: Diagnosis not present

## 2024-05-23 DIAGNOSIS — H524 Presbyopia: Secondary | ICD-10-CM | POA: Diagnosis not present

## 2024-05-30 ENCOUNTER — Other Ambulatory Visit: Payer: Self-pay | Admitting: Nurse Practitioner

## 2024-05-30 DIAGNOSIS — M1A9XX0 Chronic gout, unspecified, without tophus (tophi): Secondary | ICD-10-CM

## 2024-05-30 DIAGNOSIS — E1169 Type 2 diabetes mellitus with other specified complication: Secondary | ICD-10-CM

## 2024-06-17 ENCOUNTER — Other Ambulatory Visit: Payer: Self-pay | Admitting: Nurse Practitioner

## 2024-06-17 DIAGNOSIS — M5416 Radiculopathy, lumbar region: Secondary | ICD-10-CM

## 2024-06-19 ENCOUNTER — Ambulatory Visit: Admitting: Nurse Practitioner

## 2024-06-19 ENCOUNTER — Encounter: Payer: Self-pay | Admitting: Nurse Practitioner

## 2024-06-19 VITALS — BP 126/66 | HR 84 | Temp 97.7°F | Ht 67.0 in | Wt 249.2 lb

## 2024-06-19 DIAGNOSIS — M5416 Radiculopathy, lumbar region: Secondary | ICD-10-CM

## 2024-06-19 DIAGNOSIS — I10 Essential (primary) hypertension: Secondary | ICD-10-CM | POA: Diagnosis not present

## 2024-06-19 DIAGNOSIS — R351 Nocturia: Secondary | ICD-10-CM | POA: Diagnosis not present

## 2024-06-19 DIAGNOSIS — N182 Chronic kidney disease, stage 2 (mild): Secondary | ICD-10-CM | POA: Diagnosis not present

## 2024-06-19 DIAGNOSIS — M1A9XX0 Chronic gout, unspecified, without tophus (tophi): Secondary | ICD-10-CM | POA: Diagnosis not present

## 2024-06-19 DIAGNOSIS — J42 Unspecified chronic bronchitis: Secondary | ICD-10-CM | POA: Diagnosis not present

## 2024-06-19 DIAGNOSIS — E1169 Type 2 diabetes mellitus with other specified complication: Secondary | ICD-10-CM | POA: Diagnosis not present

## 2024-06-19 DIAGNOSIS — E790 Hyperuricemia without signs of inflammatory arthritis and tophaceous disease: Secondary | ICD-10-CM

## 2024-06-19 DIAGNOSIS — J45991 Cough variant asthma: Secondary | ICD-10-CM

## 2024-06-19 DIAGNOSIS — E785 Hyperlipidemia, unspecified: Secondary | ICD-10-CM | POA: Diagnosis not present

## 2024-06-19 DIAGNOSIS — G8929 Other chronic pain: Secondary | ICD-10-CM | POA: Diagnosis not present

## 2024-06-19 DIAGNOSIS — M5441 Lumbago with sciatica, right side: Secondary | ICD-10-CM | POA: Diagnosis not present

## 2024-06-19 LAB — COMPREHENSIVE METABOLIC PANEL WITH GFR
ALT: 21 U/L (ref 0–53)
AST: 21 U/L (ref 0–37)
Albumin: 4.4 g/dL (ref 3.5–5.2)
Alkaline Phosphatase: 55 U/L (ref 39–117)
BUN: 24 mg/dL — ABNORMAL HIGH (ref 6–23)
CO2: 28 meq/L (ref 19–32)
Calcium: 9.3 mg/dL (ref 8.4–10.5)
Chloride: 99 meq/L (ref 96–112)
Creatinine, Ser: 1.22 mg/dL (ref 0.40–1.50)
GFR: 56.27 mL/min — ABNORMAL LOW (ref >60.00)
Glucose, Bld: 194 mg/dL — ABNORMAL HIGH (ref 70–99)
Potassium: 4.3 meq/L (ref 3.5–5.1)
Sodium: 139 meq/L (ref 135–145)
Total Bilirubin: 0.4 mg/dL (ref 0.2–1.2)
Total Protein: 6.5 g/dL (ref 6.0–8.3)

## 2024-06-19 LAB — URIC ACID: Uric Acid, Serum: 6.3 mg/dL (ref 4.0–7.8)

## 2024-06-19 LAB — PSA: PSA: 1.26 ng/mL (ref 0.10–4.00)

## 2024-06-19 LAB — HEMOGLOBIN A1C: Hgb A1c MFr Bld: 6.8 % — ABNORMAL HIGH (ref 4.6–6.5)

## 2024-06-19 MED ORDER — METFORMIN HCL 850 MG PO TABS: 850.0000 mg | ORAL_TABLET | Freq: Two times a day (BID) | ORAL | 1 refills | Status: AC

## 2024-06-19 MED ORDER — HYDROCHLOROTHIAZIDE 12.5 MG PO CAPS: 12.5000 mg | ORAL_CAPSULE | Freq: Every day | ORAL | 3 refills | Status: AC

## 2024-06-19 MED ORDER — ALLOPURINOL 100 MG PO TABS: 100.0000 mg | ORAL_TABLET | Freq: Every day | ORAL | 3 refills | Status: AC

## 2024-06-19 MED ORDER — OLMESARTAN MEDOXOMIL 20 MG PO TABS: 20.0000 mg | ORAL_TABLET | Freq: Every day | ORAL | 3 refills | Status: AC

## 2024-06-19 MED ORDER — OMEGA-3-ACID ETHYL ESTERS 1 G PO CAPS: 1.0000 g | ORAL_CAPSULE | Freq: Every day | ORAL | 3 refills | Status: AC

## 2024-06-19 MED ORDER — MELOXICAM 7.5 MG PO TABS: 7.5000 mg | ORAL_TABLET | Freq: Every day | ORAL | 1 refills | Status: AC

## 2024-06-19 MED ORDER — MONTELUKAST SODIUM 10 MG PO TABS: 10.0000 mg | ORAL_TABLET | Freq: Every day | ORAL | 3 refills | Status: AC

## 2024-06-19 MED ORDER — DILTIAZEM HCL ER COATED BEADS 180 MG PO CP24: ORAL_CAPSULE | ORAL | 3 refills | Status: AC

## 2024-06-19 MED ORDER — GABAPENTIN 300 MG PO CAPS: 300.0000 mg | ORAL_CAPSULE | Freq: Every day | ORAL | 3 refills | Status: AC

## 2024-06-19 MED ORDER — METHOCARBAMOL 750 MG PO TABS: 750.0000 mg | ORAL_TABLET | Freq: Three times a day (TID) | ORAL | 3 refills | Status: AC

## 2024-06-19 MED ORDER — GLIPIZIDE ER 5 MG PO TB24: 5.0000 mg | ORAL_TABLET | Freq: Every day | ORAL | 3 refills | Status: AC

## 2024-06-19 NOTE — Patient Instructions (Addendum)
 Go to lab Maintain DASH diet and daily exercise. Maintain current medications.

## 2024-06-19 NOTE — Assessment & Plan Note (Signed)
No acute gout exacerbation in last 6months Current use of allopurinol, maintain dose Repeat uric acid and CMP: normal

## 2024-06-19 NOTE — Assessment & Plan Note (Signed)
 Chronic pain controlled with tylenol  1300mg  TID, mobic  7.5mg  and gabapentin  300mg  at hs.

## 2024-06-19 NOTE — Assessment & Plan Note (Signed)
 LDL at goal with atorvastatin  Repeat CMP

## 2024-06-19 NOTE — Progress Notes (Signed)
 Established Patient Visit  Patient: Andres Taylor   DOB: 17-Feb-1946   78 y.o. Male  MRN: [id number] Visit Date: 06/19/2024  Subjective:    Chief Complaint  Patient presents with   Follow-up    NOT FASTING 3 month follow up for DM,Hyperlipidemia  Refills for Metformin , Hydrochlorothiazide , Olmesartan  to [facility]    HPI HTN (hypertension), benign BP at goal with benicar  and hydrochlorothiazide . Weight gained (11lbs in 3months) and worsening LE edema noted today. He denies any SOB or CP or PND. BP Readings from Last 3 Encounters:  06/19/24 126/66  02/23/24 (!) 126/52  02/21/24 122/66    Maintain med doses Advised to maintain DASH diet, use compression stocking and furosemide  dose Repeat CMP F/up in 6months  Chronic bronchitis, unspecified chronic bronchitis type (HCC) No acute exacerbation Current use of symbicort  and montelukast  Under the care of Dr. Darlean  Hyperlipidemia associated with type 2 diabetes mellitus (HCC) LDL at goal with atorvastatin  Repeat CMP  DM (diabetes mellitus) (HCC) Repeat hgbA1c, and CMP normal UACr LDL at goal with atorvastatin  BP at goal Current use of metformin . 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  Wt Readings from Last 3 Encounters:  06/19/24 249 lb 3.2 oz (113 kg)  02/23/24 238 lb 9.6 oz (108.2 kg)  02/21/24 238 lb 9.6 oz (108.2 kg)    Reviewed medical, surgical, and social  history today  Medications: Outpatient Medications Prior to Visit  Medication Sig   albuterol  (VENTOLIN  HFA) 108 (90 Base) MCG/ACT inhaler Inhale 2 puffs into the lungs every 6 (six) hours as needed.   ammonium lactate (AMLACTIN) 12 % cream Apply topically daily.   aspirin  EC 81 MG tablet Take 81 mg by mouth daily. Takes couple times a week   atorvastatin  (LIPITOR) 40 MG tablet Take 1 tablet (40 mg total) by mouth every evening.   budesonide -formoterol  (SYMBICORT ) 80-4.5 MCG/ACT inhaler Take 2 puffs first thing in am and then another 2 puffs about 12 hours later.   Coenzyme Q-10 100 MG capsule Take 100 mg by mouth 2 (two) times daily.   colchicine  0.6 MG tablet Take 1 tablet (0.6 mg total) by mouth 2 (two) times daily as needed (for gout). PRN for Gout   fluticasone  (FLONASE ) 50 MCG/ACT nasal spray Place 2 sprays into both nostrils daily. (Patient taking differently: Place 2 sprays into both nostrils as needed for allergies or rhinitis.)   furosemide  (LASIX ) 20 MG tablet as needed.   Multiple Vitamin (MULTIVITAMIN) tablet Take 1 tablet by mouth daily.   [DISCONTINUED] allopurinol  (ZYLOPRIM ) 100 MG tablet TAKE 1 TABLET BY MOUTH DAILY   [DISCONTINUED] diltiazem  (CARDIZEM  CD) 180 MG 24 hr capsule TAKE 1 CAPSULE(180 MG) BY MOUTH DAILY   [DISCONTINUED] gabapentin  (NEURONTIN ) 300 MG capsule Take 1 capsule (300 mg total) by mouth at bedtime.   [DISCONTINUED] glipiZIDE  (GLUCOTROL  XL) 5 MG 24 hr tablet TAKE 1 TABLET BY MOUTH DAILY  BEFORE BREAKFAST   [DISCONTINUED] hydrochlorothiazide  (MICROZIDE ) 12.5 MG capsule TAKE 1 CAPSULE BY MOUTH DAILY   [DISCONTINUED] meloxicam  (MOBIC ) 7.5 MG tablet Take 1 tablet (7.5 mg total) by mouth daily. With food   [DISCONTINUED] metFORMIN  (GLUCOPHAGE ) 850 MG tablet TAKE 1 TABLET BY MOUTH TWICE  DAILY WITH MEALS   [DISCONTINUED] methocarbamol  (ROBAXIN ) 750 MG tablet Take 1 tablet (750 mg total) by mouth 3 (three) times daily.   [DISCONTINUED] montelukast  (SINGULAIR ) 10 MG  tablet Take 1 tablet (10 mg total) by mouth at bedtime.   [DISCONTINUED] olmesartan  (BENICAR ) 20 MG tablet TAKE 1 TABLET BY MOUTH DAILY   [DISCONTINUED] omega-3 acid ethyl esters (LOVAZA ) 1 g capsule Take 1 capsule (1 g total) by mouth daily.   cetirizine (ZYRTEC) 10 MG chewable tablet Chew 10 mg by mouth daily.   guaiFENesin  (MUCINEX ) 600 MG 12 hr tablet Take 600 mg by mouth 2 (two) times daily. (Patient taking differently: Take 600 mg by mouth 2 (two) times daily as needed for cough or to loosen phlegm.)   Facility-Administered Medications Prior to Visit  Medication Dose Route Frequency Provider   albuterol  (PROVENTIL ) (2.5 MG/3ML) 0.083% nebulizer solution 2.5 mg  2.5 mg Nebulization Once    Reviewed past medical and social history.   ROS per HPI above      Objective:  BP 126/66 (BP Location: Left Arm, Patient Position: Sitting, Cuff Size: Large)   Pulse 84   Temp 97.7 F (36.5 C) (Oral)   Ht 5' 7 (1.702 m)   Wt 249 lb 3.2 oz (113 kg)   SpO2 95%   BMI 39.03 kg/m      Physical Exam Vitals and nursing note reviewed.  Constitutional:      Appearance: He is obese.  Cardiovascular:     Rate and Rhythm: Normal rate and regular rhythm.     Pulses: Normal pulses.     Heart sounds: Normal heart sounds.  Pulmonary:     Effort: Pulmonary effort is normal.     Breath sounds: Normal breath sounds.  Musculoskeletal:     Right lower leg: Edema present.     Left lower leg: Edema present.  Skin:    Findings: No erythema.  Neurological:     Mental Status: He is alert and oriented to person, place, and time.  Psychiatric:        Mood and Affect: Mood normal.        Behavior: Behavior normal.        Thought Content: Thought content normal.     No results found for any visits on 06/19/24.    Assessment & Plan:    Problem List Items Addressed This Visit     Chronic bronchitis, unspecified chronic bronchitis type (HCC)   No acute exacerbation Current use of symbicort  and  montelukast  Under the care of Dr. Darlean      Relevant Medications   montelukast  (SINGULAIR ) 10 MG tablet   Chronic right-sided low back pain with right-sided sciatica   Chronic pain controlled with tylenol  1300mg  TID, mobic  7.5mg  and gabapentin  300mg  at hs.      Relevant Medications   gabapentin  (NEURONTIN ) 300 MG capsule   meloxicam  (MOBIC ) 7.5 MG tablet   methocarbamol  (ROBAXIN ) 750 MG tablet   CKD (chronic kidney disease) stage 2, GFR 60-89 ml/min   Repeat CMP Chronic ankle edema, improves with elevation and prn use of furosemide  He is aware not to use HCTZ, if he has to take furosemide   Relevant Orders   Comprehensive metabolic panel with GFR   Cough variant asthma with UACS component   Relevant Medications   montelukast  (SINGULAIR ) 10 MG tablet   DM (diabetes mellitus) (HCC)   Repeat hgbA1c, and CMP normal UACr LDL at goal with atorvastatin  BP at goal Current use of metformin . UTD with eye exam Positive neuropathy-controlled with gabapentin   Maintain med doses F/up in 3-63months      Relevant Medications   glipiZIDE  (GLUCOTROL  XL) 5 MG 24 hr tablet   metFORMIN  (GLUCOPHAGE ) 850 MG tablet   olmesartan  (BENICAR ) 20 MG tablet   Other Relevant Orders   Hemoglobin A1c   Comprehensive metabolic panel with GFR   HTN (hypertension), benign - Primary (Chronic)   BP at goal with benicar  and hydrochlorothiazide . Weight gained (11lbs in 3months) and worsening LE edema noted today. He denies any SOB or CP or PND. BP Readings from Last 3 Encounters:  06/19/24 126/66  02/23/24 (!) 126/52  02/21/24 122/66    Maintain med doses Advised to maintain DASH diet, use compression stocking and furosemide  dose Repeat CMP F/up in 6months      Relevant Medications   diltiazem  (CARDIZEM  CD) 180 MG 24 hr capsule   hydrochlorothiazide  (MICROZIDE ) 12.5 MG capsule   olmesartan  (BENICAR ) 20 MG tablet   omega-3 acid ethyl esters (LOVAZA ) 1 g capsule   Other Relevant Orders    Comprehensive metabolic panel with GFR   Hyperlipidemia associated with type 2 diabetes mellitus (HCC)   LDL at goal with atorvastatin  Repeat CMP      Relevant Medications   diltiazem  (CARDIZEM  CD) 180 MG 24 hr capsule   glipiZIDE  (GLUCOTROL  XL) 5 MG 24 hr tablet   hydrochlorothiazide  (MICROZIDE ) 12.5 MG capsule   metFORMIN  (GLUCOPHAGE ) 850 MG tablet   olmesartan  (BENICAR ) 20 MG tablet   omega-3 acid ethyl esters (LOVAZA ) 1 g capsule   Other Relevant Orders   Comprehensive metabolic panel with GFR   Hyperuricemia   No acute gout exacerbation in last 6months Current use of allopurinol , maintain dose Repeat uric acid and CMP      Relevant Medications   allopurinol  (ZYLOPRIM ) 100 MG tablet   meloxicam  (MOBIC ) 7.5 MG tablet   Other Relevant Orders   Uric acid   Other Visit Diagnoses       Nocturia       Relevant Orders   PSA     Chronic gout without tophus, unspecified cause, unspecified site       Relevant Medications   allopurinol  (ZYLOPRIM ) 100 MG tablet   meloxicam  (MOBIC ) 7.5 MG tablet     Lumbar radiculopathy, right       Relevant Medications   gabapentin  (NEURONTIN ) 300 MG capsule   meloxicam  (MOBIC ) 7.5 MG tablet   methocarbamol  (ROBAXIN ) 750 MG tablet      Return in about 6 months (around 12/18/2024) for HTN, DM, hyperlipidemia (fasting).     Roselie Mood, NP

## 2024-06-19 NOTE — Assessment & Plan Note (Signed)
 Repeat CMP Chronic ankle edema, improves with elevation and prn use of furosemide  He is aware not to use HCTZ, if he has to take furosemide 

## 2024-06-19 NOTE — Assessment & Plan Note (Signed)
 Repeat hgbA1c, and CMP normal UACr LDL at goal with atorvastatin  BP at goal Current use of metformin . UTD with eye exam Positive neuropathy-controlled with gabapentin   Maintain med doses F/up in 3-44months

## 2024-06-19 NOTE — Assessment & Plan Note (Addendum)
 No acute exacerbation Current use of symbicort  and montelukast  Under the care of Dr. Darlean

## 2024-06-19 NOTE — Assessment & Plan Note (Addendum)
 BP at goal with benicar  and hydrochlorothiazide . Weight gained (11lbs in 3months) and worsening LE edema noted today. He denies any SOB or CP or PND. BP Readings from Last 3 Encounters:  06/19/24 126/66  02/23/24 (!) 126/52  02/21/24 122/66    Maintain med doses Advised to maintain DASH diet, use compression stocking and furosemide  dose Repeat CMP F/up in 6months

## 2024-06-22 ENCOUNTER — Ambulatory Visit: Payer: Self-pay | Admitting: Nurse Practitioner

## 2024-10-23 ENCOUNTER — Ambulatory Visit

## 2024-12-18 ENCOUNTER — Ambulatory Visit: Admitting: Nurse Practitioner
